# Patient Record
Sex: Male | Born: 1935 | Race: White | Hispanic: No | Marital: Married | Smoking: Former smoker
Health system: Southern US, Community
[De-identification: ages and names within clinical notes are randomized; demographics above are authoritative.]

## PROBLEM LIST (undated history)

## (undated) DIAGNOSIS — K219 Gastro-esophageal reflux disease without esophagitis: Secondary | ICD-10-CM

## (undated) DIAGNOSIS — F419 Anxiety disorder, unspecified: Secondary | ICD-10-CM

## (undated) DIAGNOSIS — M545 Low back pain, unspecified: Secondary | ICD-10-CM

## (undated) DIAGNOSIS — H409 Unspecified glaucoma: Secondary | ICD-10-CM

## (undated) DIAGNOSIS — Z974 Presence of external hearing-aid: Secondary | ICD-10-CM

## (undated) DIAGNOSIS — E785 Hyperlipidemia, unspecified: Secondary | ICD-10-CM

## (undated) DIAGNOSIS — G8929 Other chronic pain: Secondary | ICD-10-CM

## (undated) DIAGNOSIS — G250 Essential tremor: Secondary | ICD-10-CM

## (undated) DIAGNOSIS — T7840XA Allergy, unspecified, initial encounter: Secondary | ICD-10-CM

## (undated) DIAGNOSIS — G459 Transient cerebral ischemic attack, unspecified: Secondary | ICD-10-CM

## (undated) DIAGNOSIS — M1990 Unspecified osteoarthritis, unspecified site: Secondary | ICD-10-CM

## (undated) DIAGNOSIS — Z973 Presence of spectacles and contact lenses: Secondary | ICD-10-CM

## (undated) DIAGNOSIS — K759 Inflammatory liver disease, unspecified: Secondary | ICD-10-CM

## (undated) DIAGNOSIS — H9190 Unspecified hearing loss, unspecified ear: Secondary | ICD-10-CM

## (undated) DIAGNOSIS — G43909 Migraine, unspecified, not intractable, without status migrainosus: Secondary | ICD-10-CM

## (undated) DIAGNOSIS — R011 Cardiac murmur, unspecified: Secondary | ICD-10-CM

## (undated) DIAGNOSIS — J302 Other seasonal allergic rhinitis: Secondary | ICD-10-CM

## (undated) HISTORY — PX: BACK SURGERY: SHX140

## (undated) HISTORY — PX: COLONOSCOPY: SHX174

## (undated) HISTORY — PX: HEMORRHOID SURGERY: SHX153

## (undated) HISTORY — PX: UPPER GASTROINTESTINAL ENDOSCOPY: SHX188

## (undated) HISTORY — DX: Essential tremor: G25.0

## (undated) HISTORY — DX: Unspecified glaucoma: H40.9

## (undated) HISTORY — PX: KNEE ARTHROSCOPY: SUR90

## (undated) HISTORY — DX: Allergy, unspecified, initial encounter: T78.40XA

## (undated) HISTORY — PX: APPENDECTOMY: SHX54

## (undated) HISTORY — PX: TONSILLECTOMY: SUR1361

## (undated) HISTORY — DX: Transient cerebral ischemic attack, unspecified: G45.9

## (undated) HISTORY — PX: CATARACT EXTRACTION W/ INTRAOCULAR LENS  IMPLANT, BILATERAL: SHX1307

---

## 2000-06-02 ENCOUNTER — Ambulatory Visit (HOSPITAL_COMMUNITY): Admission: RE | Admit: 2000-06-02 | Discharge: 2000-06-02 | Payer: Self-pay | Admitting: Family Medicine

## 2000-12-08 ENCOUNTER — Ambulatory Visit (HOSPITAL_COMMUNITY): Admission: RE | Admit: 2000-12-08 | Discharge: 2000-12-08 | Payer: Self-pay | Admitting: Gastroenterology

## 2000-12-08 ENCOUNTER — Encounter (INDEPENDENT_AMBULATORY_CARE_PROVIDER_SITE_OTHER): Payer: Self-pay | Admitting: *Deleted

## 2000-12-08 ENCOUNTER — Encounter (INDEPENDENT_AMBULATORY_CARE_PROVIDER_SITE_OTHER): Payer: Self-pay

## 2005-08-25 HISTORY — PX: ANTERIOR CERVICAL DECOMP/DISCECTOMY FUSION: SHX1161

## 2005-09-15 ENCOUNTER — Encounter: Admission: RE | Admit: 2005-09-15 | Discharge: 2005-09-15 | Payer: Self-pay | Admitting: Neurosurgery

## 2005-11-25 ENCOUNTER — Encounter: Payer: Self-pay | Admitting: Gastroenterology

## 2005-12-12 ENCOUNTER — Inpatient Hospital Stay (HOSPITAL_COMMUNITY): Admission: RE | Admit: 2005-12-12 | Discharge: 2005-12-13 | Payer: Self-pay | Admitting: Neurosurgery

## 2009-08-23 ENCOUNTER — Ambulatory Visit: Payer: Self-pay | Admitting: Family Medicine

## 2009-08-23 DIAGNOSIS — N529 Male erectile dysfunction, unspecified: Secondary | ICD-10-CM | POA: Insufficient documentation

## 2009-08-23 DIAGNOSIS — G252 Other specified forms of tremor: Secondary | ICD-10-CM

## 2009-08-23 DIAGNOSIS — F411 Generalized anxiety disorder: Secondary | ICD-10-CM | POA: Insufficient documentation

## 2009-08-23 DIAGNOSIS — E785 Hyperlipidemia, unspecified: Secondary | ICD-10-CM | POA: Insufficient documentation

## 2009-08-23 DIAGNOSIS — G250 Essential tremor: Secondary | ICD-10-CM | POA: Insufficient documentation

## 2009-10-04 ENCOUNTER — Ambulatory Visit: Payer: Self-pay | Admitting: Family Medicine

## 2009-10-04 DIAGNOSIS — G2581 Restless legs syndrome: Secondary | ICD-10-CM | POA: Insufficient documentation

## 2009-11-05 ENCOUNTER — Ambulatory Visit: Payer: Self-pay | Admitting: Family Medicine

## 2009-11-05 DIAGNOSIS — K219 Gastro-esophageal reflux disease without esophagitis: Secondary | ICD-10-CM | POA: Insufficient documentation

## 2009-11-20 ENCOUNTER — Telehealth: Payer: Self-pay | Admitting: Family Medicine

## 2010-06-12 ENCOUNTER — Telehealth: Payer: Self-pay | Admitting: Family Medicine

## 2010-06-14 ENCOUNTER — Telehealth: Payer: Self-pay | Admitting: Family Medicine

## 2010-08-01 ENCOUNTER — Ambulatory Visit: Payer: Self-pay | Admitting: Family Medicine

## 2010-08-01 ENCOUNTER — Encounter: Payer: Self-pay | Admitting: Family Medicine

## 2010-08-01 ENCOUNTER — Encounter: Payer: Self-pay | Admitting: Gastroenterology

## 2010-08-01 LAB — CONVERTED CEMR LAB
ALT: 23 units/L (ref 0–53)
AST: 23 units/L (ref 0–37)
Albumin: 4 g/dL (ref 3.5–5.2)
Alkaline Phosphatase: 52 units/L (ref 39–117)
BUN: 15 mg/dL (ref 6–23)
Basophils Absolute: 0 10*3/uL (ref 0.0–0.1)
Basophils Relative: 0.4 % (ref 0.0–3.0)
Bilirubin, Direct: 0.1 mg/dL (ref 0.0–0.3)
CO2: 28 meq/L (ref 19–32)
Calcium: 9.5 mg/dL (ref 8.4–10.5)
Chloride: 103 meq/L (ref 96–112)
Cholesterol: 180 mg/dL (ref 0–200)
Creatinine, Ser: 1.2 mg/dL (ref 0.4–1.5)
Eosinophils Absolute: 0 10*3/uL (ref 0.0–0.7)
Eosinophils Relative: 0.5 % (ref 0.0–5.0)
GFR calc non Af Amer: 66.2 mL/min (ref >60.00)
Glucose, Bld: 103 mg/dL — ABNORMAL HIGH (ref 70–99)
HCT: 45.2 % (ref 39.0–52.0)
HDL: 46.5 mg/dL (ref >39.00)
Hemoglobin: 15.3 g/dL (ref 13.0–17.0)
LDL Cholesterol: 114 mg/dL — ABNORMAL HIGH (ref 0–99)
Lymphocytes Relative: 23 % (ref 12.0–46.0)
Lymphs Abs: 1.6 10*3/uL (ref 0.7–4.0)
MCHC: 33.9 g/dL (ref 30.0–36.0)
MCV: 92.3 fL (ref 78.0–100.0)
Monocytes Absolute: 0.5 10*3/uL (ref 0.1–1.0)
Monocytes Relative: 6.7 % (ref 3.0–12.0)
Neutro Abs: 4.9 10*3/uL (ref 1.4–7.7)
Neutrophils Relative %: 69.4 % (ref 43.0–77.0)
PSA: 2.32 ng/mL (ref 0.10–4.00)
Platelets: 260 10*3/uL (ref 150.0–400.0)
Potassium: 5.1 meq/L (ref 3.5–5.1)
RBC: 4.9 M/uL (ref 4.22–5.81)
RDW: 13.2 % (ref 11.5–14.6)
Sodium: 138 meq/L (ref 135–145)
TSH: 1.21 microintl units/mL (ref 0.35–5.50)
Total Bilirubin: 0.6 mg/dL (ref 0.3–1.2)
Total CHOL/HDL Ratio: 4
Total Protein: 6.5 g/dL (ref 6.0–8.3)
Triglycerides: 97 mg/dL (ref 0.0–149.0)
VLDL: 19.4 mg/dL (ref 0.0–40.0)
WBC: 7.1 10*3/uL (ref 4.5–10.5)

## 2010-08-02 ENCOUNTER — Telehealth: Payer: Self-pay | Admitting: Family Medicine

## 2010-08-06 ENCOUNTER — Ambulatory Visit: Payer: Self-pay | Admitting: Gastroenterology

## 2010-08-06 DIAGNOSIS — Z860100 Personal history of colon polyps, unspecified: Secondary | ICD-10-CM | POA: Insufficient documentation

## 2010-08-06 DIAGNOSIS — R1319 Other dysphagia: Secondary | ICD-10-CM | POA: Insufficient documentation

## 2010-08-06 DIAGNOSIS — R1310 Dysphagia, unspecified: Secondary | ICD-10-CM | POA: Insufficient documentation

## 2010-08-14 ENCOUNTER — Ambulatory Visit: Payer: Self-pay

## 2010-08-29 ENCOUNTER — Ambulatory Visit: Admission: RE | Admit: 2010-08-29 | Discharge: 2010-08-29 | Payer: Self-pay | Source: Home / Self Care

## 2010-08-29 ENCOUNTER — Other Ambulatory Visit: Payer: Self-pay | Admitting: Cardiovascular Disease

## 2010-08-29 ENCOUNTER — Ambulatory Visit (HOSPITAL_COMMUNITY)
Admission: RE | Admit: 2010-08-29 | Discharge: 2010-08-29 | Payer: Self-pay | Source: Home / Self Care | Attending: Cardiovascular Disease | Admitting: Cardiovascular Disease

## 2010-09-11 ENCOUNTER — Other Ambulatory Visit: Payer: Self-pay | Admitting: Gastroenterology

## 2010-09-11 ENCOUNTER — Ambulatory Visit
Admission: RE | Admit: 2010-09-11 | Discharge: 2010-09-11 | Payer: Self-pay | Source: Home / Self Care | Attending: Gastroenterology | Admitting: Gastroenterology

## 2010-09-17 ENCOUNTER — Encounter: Payer: Self-pay | Admitting: Gastroenterology

## 2010-09-24 NOTE — Assessment & Plan Note (Signed)
Summary: fu per pt/njr---PT [facility] // RS   Vital Signs:  Patient profile:   76 year old male Weight:      164 pounds Temp:     98.5 degrees F oral BP sitting:   110 / 68  (left arm) Cuff size:   regular  Vitals Entered By: Kern Reap CMA Duncan Dull) (November 05, 2009 2:51 PM)  Reason for Visit follow up with concerns  History of Present Illness: Jason Farmer is a 76 year old male, who comes in today for evaluation of two problems actually 3.  We started him on Mirapex .5 nightly in February for restless leg syndrome.  He comes back today saying is 50% improved.  No side effects from the medication.  If any trouble with pain tingling and pins and needle sensation in his right hand.  He is a curse at night after he goes to sleep.  No history of trauma.  He also took 14 days and OTC Prilosec and lot of his reflux symptoms went away.   He can take it on a long-term basis ????????  Allergies: No Known Drug Allergies  Past History:  Past medical, surgical, family and social histories (including risk factors) reviewed for relevance to current acute and chronic problems.  Past Medical History: Reviewed history from 08/23/2009 and no changes required. hx jaundice as a child leg cramping seasonal allergies  Past Surgical History: Reviewed history from 08/23/2009 and no changes required. left knee Appendectomy Tonsillectomy hemrroids neck  Family History: Reviewed history from 08/23/2009 and no changes required. Father: deceased - demenia Mother: deceased  Siblings: brother - healthy Chidren: 2 boys, 2 girls - healthy  Social History: Reviewed history from 08/23/2009 and no changes required. Retired Married Alcohol use-no Drug use-no  Review of Systems      See HPI  Physical Exam  General:  Well-developed,well-nourished,in no acute distress; alert,appropriate and cooperative throughout examination Msk:  No deformity or scoliosis noted of thoracic or lumbar spine.   Pulses:  R  and L carotid,radial,femoral,dorsalis pedis and posterior tibial pulses are full and equal bilaterally Extremities:  No clubbing, cyanosis, edema, or deformity noted with normal full range of motion of all joints.   Neurologic:  No cranial nerve deficits noted. Station and gait are normal. Plantar reflexes are down-going bilaterally. DTRs are symmetrical throughout. Sensory, motor and coordinative functions appear intact.   Problems:  Medical Problems Added: 1)  Dx of Carpal Tunnel Syndrome  (ICD-354.0) 2)  Dx of Genella Rife  (ICD-530.81)  Impression & Recommendations:  Problem # 1:  RESTLESS LEG SYNDROME (ICD-333.94) Assessment Improved  Orders: Prescription Created Electronically [phone])  Problem # 2:  CARPAL TUNNEL SYNDROME (ICD-354.0) Assessment: New  Orders: Prescription Created Electronically [id number])  Problem # 3:  GERD (ICD-530.81) Assessment: New  His updated medication list for this problem includes:    Prilosec Otc 20 Mg Tbec (Omeprazole magnesium) ..... Once daily  Orders: Prescription Created Electronically [phone])  Complete Medication List: 1)  Atenolol 100 Mg Tabs (Atenolol) .... Take one tab once daily 2)  Crestor 10 Mg Tabs (Rosuvastatin calcium) .... Take one tab once daily 3)  Alprazolam 0.5 Mg Tabs (Alprazolam) .... Take one tab once daily as needed 4)  Viagra 50 Mg Tabs (Sildenafil citrate) .... Uad 5)  Imiquimod 5 % Crea (Imiquimod) .... Use as directed 6)  Ureacin-20 20 % Crea (Urea) .... Use as directed 7)  Prilosec Otc 20 Mg Tbec (Omeprazole magnesium) .... Once daily 8)  Mirapex 1 Mg Tabs (Pramipexole dihydrochloride) .Marland KitchenMarland KitchenMarland Kitchen  1 tab @ bedtime  Patient Instructions: 1)  you can take OTC Prilosec daily. 2)  Used the short arm splint nightly for two to 3 weeks.  If after that time, you don't see any improvement.  Call the hand surgeon, Dr.Sypher at the hand Center. 3)  Increased the MiraLax to a full milligram tablet at bedtime Prescriptions: MIRAPEX 1 MG  TABS (PRAMIPEXOLE DIHYDROCHLORIDE) 1 tab @ bedtime  #100 x 3   Entered and Authorized by:   Roderick Pee MD   Signed by:   Roderick Pee MD on 11/05/2009   Method used:   Print then Give to Patient   RxID:   [id number]

## 2010-09-24 NOTE — Letter (Signed)
Summary: New Patient letter  [facility]  [address] [place], [place] [address]   Phone: [phone]  Fax: [phone]       08/01/2010 MRN: [id number]  [facility] [address] [place], [place]  [address]  Dear Jason Farmer,  Welcome to the Gastroenterology Division at [facility].    You are scheduled to see Dr.  Russella Dar on 08-06-10 at 9:30am on the 3rd floor at [facility], [address]  We ask that you try to arrive at our office 15 minutes prior to your appointment time to allow for check-in.  We would like you to complete the enclosed self-administered evaluation form prior to your visit and bring it with you on the day of your appointment.  We will review it with you.  Also, please bring a complete list of all your medications or, if you prefer, bring the medication bottles and we will list them.  Please bring your insurance card so that we may make a copy of it.  If your insurance requires a referral to see a specialist, please bring your referral form from your primary care physician.  Co-payments are due at the time of your visit and may be paid by cash, check or credit card.     Your office visit will consist of a consult with your physician (includes a physical exam), any laboratory testing he/she may order, scheduling of any necessary diagnostic testing (e.g. x-ray, ultrasound, CT-scan), and scheduling of a procedure (e.g. Endoscopy, Colonoscopy) if required.  Please allow enough time on your schedule to allow for any/all of these possibilities.    If you cannot keep your appointment, please call [phone] to cancel or reschedule prior to your appointment date.  This allows [name] the opportunity to schedule an appointment for another patient in need of care.  If you do not cancel or reschedule by 5 p.m. the business day prior to your appointment date, you will be charged a $50.00 late cancellation/no-show fee.    Thank you for choosing  [facility] for your medical needs.  We appreciate the opportunity to care for you.  Please visit [name] at our website  to learn more about our practice.                     Sincerely,                                                             The Gastroenterology Division

## 2010-09-24 NOTE — Assessment & Plan Note (Signed)
Summary: cpx/pt will be fasting/mm   Vital Signs:  Patient profile:   76 year old male Height:      65 inches Weight:      165 pounds BMI:     27.56 O2 Sat:      97 % on Room air Temp:     97.8 degrees F oral Pulse rate:   83 / minute BP sitting:   122 / 82  (left arm) Cuff size:   regular  Vitals Entered By: Kathlene November LPN (August 01, 2010 8:21 AM)  O2 Flow:  Room air CC: CPE   CC:  CPE.  History of Present Illness: Pacer is  a 76 year old, married, nonsmoker who comes in today for Medicare wellness exam.  He has a history of underlying hyperlipidemia, for which he takes Crestor 10 mg nightly Will check lipid panel today.  Also for anxiety.  He takes alprazolam .5 mg daily p.r.n.  He is on atenolol 50 mg daily for for an essential tremor.  He continues to go to [facility] to be treated for the wart on his foot.  Other issues are descending difficulty swallowing and occasionally food gets stuck in his esophagus.  We will set him up for a GI evaluation ASAP advised to stay on a soft diet in the meantime, he continues to show with restless leg syndrome, and some numbness in his right hand.  We discussed this in May.  Medication and splinting has not helped.  Will refer to neurology.  He cannot tolerate aspirin even low-dose makes it improves.  He's also noticed some shortness of breath when he climbs stairs.  He states he can walk on level ground forever and get no shortness or breath.  However, climbing stairs.  He is in shortness of breath, sometimes with no chest pain.  Is concerned about his heart and tetanus 2005 Pneumovax 2010 seasonal flu shot 2011 Here for Medicare AWV:  1.   Risk factors based on Past M, S, F history:...see above 2.   Physical Activities: walks daily 3.   Depression/mood: good mood.  No depression 4.   Hearing: decreased, but declines evaluation 5.   ADL's: functions independently 6.   Fall Risk: reviewed not identified 7.   Home Safety: no guns in the  house 8.   Height, weight, &visual acuity:height weight, normal.  Vision normal 9.   Counseling: soft diet until we can get him a GI consult 10.   Labs ordered based on risk factors: done today 11.           Referral Coordination......GI consult,,,,and neurology consult 12.           Care Plan......Marland Kitchenreviewed all medications 13.            Cognitive Assessment .Marland Kitchen...orient x 3 financially independent  Current Medications (verified): 1)  Crestor 10 Mg Tabs (Rosuvastatin Calcium) .... Take One Tab Once Daily 2)  Alprazolam 0.5 Mg Tabs (Alprazolam) .... Take One Tab Once Daily As Needed 3)  Atenolol 50 Mg Tabs (Atenolol) .... Take One Tab By Mouth Once Daily  Allergies (verified): No Known Drug Allergies  Comments:  Nurse/Medical Assistant: The patient's medications and allergies were reviewed with the patient and were updated in the Medication and Allergy Lists. Kathlene November LPN (August 01, 2010 8:23 AM)  Past History:  Past medical, surgical, family and social histories (including risk factors) reviewed, and no changes noted (except as noted below).  Past Medical History: Reviewed history  from 08/23/2009 and no changes required. hx jaundice as a child leg cramping seasonal allergies  Past Surgical History: Reviewed history from 08/23/2009 and no changes required. left knee Appendectomy Tonsillectomy hemrroids neck  Family History: Reviewed history from 08/23/2009 and no changes required. Father: deceased - demenia Mother: deceased  Siblings: brother - healthy Chidren: 2 boys, 2 girls - healthy  Social History: Reviewed history from 08/23/2009 and no changes required. Retired Married Alcohol use-no Drug use-no  Review of Systems      See HPI  Physical Exam  General:  Well-developed,well-nourished,in no acute distress; alert,appropriate and cooperative throughout examination Head:  Normocephalic and atraumatic without obvious abnormalities. No apparent alopecia  or balding. Eyes:  No corneal or conjunctival inflammation noted. EOMI. Perrla. Funduscopic exam benign, without hemorrhages, exudates or papilledema. Vision grossly normal. Ears:  External ear exam shows no significant lesions or deformities.  Otoscopic examination reveals clear canals, tympanic membranes are intact bilaterally without bulging, retraction, inflammation or discharge. Hearing is grossly normal bilaterally. Nose:  External nasal examination shows no deformity or inflammation. Nasal mucosa are pink and moist without lesions or exudates. Mouth:  Oral mucosa and oropharynx without lesions or exudates.  Teeth in good repair. Neck:  No deformities, masses, or tenderness noted. Chest Wall:  symmetrical decrease in breath sounds, history of smoking, quit 2010 Breasts:  No masses or gynecomastia noted Lungs:  Normal respiratory effort, chest expands symmetrically. Lungs are clear to auscultation, no crackles or wheezes. Heart:  Normal rate and regular rhythm. S1 and S2 normal without gallop, murmur, click, rub or other extra sounds. Abdomen:  Bowel sounds positive,abdomen soft and non-tender without masses, organomegaly or hernias noted. Rectal:  No external abnormalities noted. Normal sphincter tone. No rectal masses or tenderness. Genitalia:  Testes bilaterally descended without nodularity, tenderness or masses. No scrotal masses or lesions. No penis lesions or urethral discharge. Prostate:  no nodules, no asymmetry, and 1+ enlarged.   Msk:  No deformity or scoliosis noted of thoracic or lumbar spine.   Pulses:  R and L carotid,radial,femoral,dorsalis pedis and posterior tibial pulses are full and equal bilaterally Extremities:  No clubbing, cyanosis, edema, or deformity noted with normal full range of motion of all joints.   Neurologic:  No cranial nerve deficits noted. Station and gait are normal. Plantar reflexes are down-going bilaterally. DTRs are symmetrical throughout. Sensory, motor  and coordinative functions appear intact. Skin:  total body skin exam normal.  He has a garden variety of freckles moles and seborrheic keratoses.  A wart on his left foot.  This rather large Cervical Nodes:  No lymphadenopathy noted Axillary Nodes:  No palpable lymphadenopathy Inguinal Nodes:  No significant adenopathy Psych:  Cognition and judgment appear intact. Alert and cooperative with normal attention span and concentration. No apparent delusions, illusions, hallucinations   Problems:  Medical Problems Added: 1)  Dx of Routine General Medical Exam@health  Care Facl  (ICD-V70.0) 2)  Dx of Shortness of Breath  (ICD-786.05)  Impression & Recommendations:  Problem # 1:  SHORTNESS OF BREATH (ICD-786.05) Assessment New  Orders: Venipuncture ([address]) TLB-Lipid Panel (80061-LIPID) TLB-BMP (Basic Metabolic Panel-BMET) (80048-METABOL) TLB-CBC Platelet - w/Differential (85025-CBCD) TLB-Hepatic/Liver Function Pnl (80076-HEPATIC) TLB-TSH (Thyroid Stimulating Hormone) (84443-TSH) TLB-PSA (Prostate Specific Antigen) (60454-UJW) Cardiology Referral (Cardiology) Prescription Created Electronically [id number]) Medicare -1st Annual Wellness Visit [phone]) Urinalysis-dipstick only (Medicare patient) (62130QM) T-2 View CXR (71020TC)  Problem # 2:  CARPAL TUNNEL SYNDROME (ICD-354.0) Assessment: Deteriorated  Orders: Venipuncture ([address]) TLB-Lipid Panel (80061-LIPID) TLB-BMP (Basic Metabolic Panel-BMET) (80048-METABOL)  TLB-CBC Platelet - w/Differential (85025-CBCD) TLB-Hepatic/Liver Function Pnl (80076-HEPATIC) TLB-TSH (Thyroid Stimulating Hormone) (84443-TSH) TLB-PSA (Prostate Specific Antigen) (84153-PSA) Prescription Created Electronically [phone]) Medicare -1st Annual Wellness Visit [phone]) Urinalysis-dipstick only (Medicare patient) (32440NU)  Problem # 3:  GERD (ICD-530.81) Assessment: Deteriorated  The following medications were removed from the medication list:    Prilosec Otc 20  Mg Tbec (Omeprazole magnesium) ..... Once daily  Orders: Venipuncture ([address]) TLB-Lipid Panel (80061-LIPID) TLB-BMP (Basic Metabolic Panel-BMET) (80048-METABOL) TLB-CBC Platelet - w/Differential (85025-CBCD) TLB-Hepatic/Liver Function Pnl (80076-HEPATIC) TLB-TSH (Thyroid Stimulating Hormone) (84443-TSH) TLB-PSA (Prostate Specific Antigen) (84153-PSA) Prescription Created Electronically [phone]) Medicare -1st Annual Wellness Visit [phone]) Urinalysis-dipstick only (Medicare patient) (59563OV) Gastroenterology Referral (GI)  Problem # 4:  ANXIETY DISORDER, GENERALIZED (ICD-300.02) Assessment: Improved  His updated medication list for this problem includes:    Alprazolam 0.5 Mg Tabs (Alprazolam) .Marland Kitchen... Take one tab once daily as needed  Orders: Venipuncture (56433) TLB-Lipid Panel (80061-LIPID) TLB-BMP (Basic Metabolic Panel-BMET) (80048-METABOL) TLB-CBC Platelet - w/Differential (85025-CBCD) TLB-Hepatic/Liver Function Pnl (80076-HEPATIC) TLB-TSH (Thyroid Stimulating Hormone) (84443-TSH) TLB-PSA (Prostate Specific Antigen) (84153-PSA) Prescription Created Electronically [phone]) Medicare -1st Annual Wellness Visit [phone]) Urinalysis-dipstick only (Medicare patient) (06301SW)  Problem # 5:  TREMOR, ESSENTIAL (ICD-333.1) Assessment: Improved  Orders: Venipuncture (10932) TLB-Lipid Panel (80061-LIPID) TLB-BMP (Basic Metabolic Panel-BMET) (80048-METABOL) TLB-CBC Platelet - w/Differential (85025-CBCD) TLB-Hepatic/Liver Function Pnl (80076-HEPATIC) TLB-TSH (Thyroid Stimulating Hormone) (84443-TSH) TLB-PSA (Prostate Specific Antigen) (84153-PSA) Prescription Created Electronically [id number]) Medicare -1st Annual Wellness Visit [phone]) Urinalysis-dipstick only (Medicare patient) (42706CB)  Problem # 6:  HYPERLIPIDEMIA (ICD-272.4) Assessment: Improved  His updated medication list for this problem includes:    Crestor 10 Mg Tabs (Rosuvastatin calcium) .Marland Kitchen... Take one tab once  daily  Orders: Venipuncture (76283) TLB-Lipid Panel (80061-LIPID) TLB-BMP (Basic Metabolic Panel-BMET) (80048-METABOL) TLB-CBC Platelet - w/Differential (85025-CBCD) TLB-Hepatic/Liver Function Pnl (80076-HEPATIC) TLB-TSH (Thyroid Stimulating Hormone) (84443-TSH) TLB-PSA (Prostate Specific Antigen) (84153-PSA) Prescription Created Electronically [phone]) Medicare -1st Annual Wellness Visit [phone]) Urinalysis-dipstick only (Medicare patient) (71062IR)  Problem # 7:  Preventive Health Care (ICD-V70.0) Assessment: Unchanged  Complete Medication List: 1)  Crestor 10 Mg Tabs (Rosuvastatin calcium) .... Take one tab once daily 2)  Alprazolam 0.5 Mg Tabs (Alprazolam) .... Take one tab once daily as needed 3)  Atenolol 50 Mg Tabs (Atenolol) .... Take one tab by mouth once daily  Other Orders: Flu Vaccine 38yrs + (48546) Admin 1st Vaccine (27035) Specimen Handling (00938)  Patient Instructions: 1)  we will do the pulmonary function studies today, and go get a chest x-ray.........Marland Kitchen we will then be to set up to go to cardiology to get a cardiac stress test.  Once we finished the evaluation will sit down and discuss all the findings together. 2)  I will also call to get her set up for GI consult ASAP for evaluation of the difficulty swallowing and food getting stuck in her esophagus.  In the meantime stay on a soft diet and take the Prilosec one twice daily 3)  We will also get to set up for a neurologic consult for evaluation of the wrestles like syndrome and the carpal tunnel syndrome Prescriptions: ALPRAZOLAM 0.5 MG TABS (ALPRAZOLAM) take one tab once daily as needed  #60 x 1   Entered and Authorized by:   Roderick Pee MD   Signed by:   Roderick Pee MD on 08/01/2010   Method used:   Print then Give to Patient   RxID:   [id number] ATENOLOL 50 MG TABS (ATENOLOL) take one tab by mouth once daily  #100  x 3   Entered and Authorized by:   Roderick Pee MD   Signed by:   Roderick Pee  MD on 08/01/2010   Method used:   Print then Give to Patient   RxID:   [id number] CRESTOR 10 MG TABS (ROSUVASTATIN CALCIUM) take one tab once daily  #100 x 3   Entered and Authorized by:   Roderick Pee MD   Signed by:   Roderick Pee MD on 08/01/2010   Method used:   Print then Give to Patient   RxID:   [id number]    Orders Added: 1)  Flu Vaccine 39yrs + [01027] 2)  Admin 1st Vaccine [90471] 3)  Venipuncture [25366] 4)  TLB-Lipid Panel [80061-LIPID] 5)  TLB-BMP (Basic Metabolic Panel-BMET) [80048-METABOL] 6)  TLB-CBC Platelet - w/Differential [85025-CBCD] 7)  TLB-Hepatic/Liver Function Pnl [80076-HEPATIC] 8)  TLB-TSH (Thyroid Stimulating Hormone) [84443-TSH] 9)  TLB-PSA (Prostate Specific Antigen) [44034-VQQ] 10)  Cardiology Referral [Cardiology] 11)  Prescription Created Electronically [G8553] 12)  Medicare -1st Annual Wellness Visit [G0438] 13)  Urinalysis-dipstick only (Medicare patient) [81003QW] 14)  T-2 View CXR [71020TC] 15)  Specimen Handling [99000] 16)  Gastroenterology Referral [GI]   Immunization History:  Pneumovax Immunization History:    Pneumovax:  historical (07/25/2009)  Immunizations Administered:  Influenza Vaccine # 1:    Vaccine Type: Fluvax 3+    Site: right deltoid    Mfr: GlaxoSmithKline    Dose: 0.5 ml    Route: IM    Given by: Kathlene November LPN    Exp. Date: 02/22/2011    Lot #: [id number]    VIS given: 03/19/10 version given August 01, 2010.   Immunization History:  Pneumovax Immunization History:    Pneumovax:  Historical (07/25/2009)  Immunizations Administered:  Influenza Vaccine # 1:    Vaccine Type: Fluvax 3+    Site: right deltoid    Mfr: GlaxoSmithKline    Dose: 0.5 ml    Route: IM    Given by: Kathlene November LPN    Exp. Date: 02/22/2011    Lot #: [id number]    VIS given: 03/19/10 version given August 01, 2010.    Appended Document: cpx/pt will be fasting/mm  Laboratory Results   Urine  Tests    Routine Urinalysis   Color: yellow Appearance: Clear Glucose: negative   (Normal Range: Negative) Bilirubin: negative   (Normal Range: Negative) Ketone: negative   (Normal Range: Negative) Spec. Gravity: 1.020   (Normal Range: 1.003-1.035) Blood: 3+   (Normal Range: Negative) pH: 5.0   (Normal Range: 5.0-8.0) Protein: 1+   (Normal Range: Negative) Urobilinogen: 0.2   (Normal Range: 0-1) Nitrite: negative   (Normal Range: Negative) Leukocyte Esterace: negative   (Normal Range: Negative)    Comments: Rita Ohara  August 01, 2010 3:26 PM

## 2010-09-24 NOTE — Progress Notes (Signed)
Summary: rx clarification  Phone Note From Pharmacy   Summary of Call: rx clairification Initial call taken by: Kern Reap CMA Duncan Dull),  June 14, 2010 2:04 PM    New/Updated Medications: ATENOLOL 50 MG TABS (ATENOLOL) take one tab by mouth once daily

## 2010-09-24 NOTE — Assessment & Plan Note (Signed)
Summary: BRAND NEW PT/TO ESTABLISH/PER DR Jason Farmer/CJR   Vital Signs:  Patient profile:   76 year old male Height:      65 inches Weight:      167 pounds BMI:     27.89 Temp:     98.5 degrees F oral BP sitting:   124 / 84  (right arm) Cuff size:   regular  Vitals Entered By: Kern Reap CMA Duncan Dull) (August 23, 2009 10:23 AM)  Reason for Visit new to establish   History of Present Illness: Jason Farmer is a 76 year old, married male, nonsmoker, who comes in today to be established as a new patient.  He is a long-term resident of [place].  Retired Print production planner.  Transferring from Dr. Georgina Pillion at [facility].  Dr. Georgina Pillion did a physical examination and lab work on and two weeks ago.  All of which was normal.  He goes to the [place] in January of every year and likes to have a physical in November.  He takes atenolol 50 mg daily for a benign tremor.  He takes Crestor 10 mg nightly for hyperlipidemia.  He also takes alprazolam .25 p.r.n. for anxiety.  Review of systems shows he gets routine eye care.  Dental care.  Colonoscopy done, and GI normal except for a polyp x 1.  Tetanus booster 2005, up-to-date on all his other vaccinations.  his wife also wants to transfer however, she is due to be admitted to the hospital and have a surgical procedure by Dr. Cyndra Numbers.  He would also like Viagra.    Preventive Screening-Counseling & Management  Alcohol-Tobacco     Smoking Status: quit > 6 months     Year Quit: x 24 years      Drug Use:  no.    Allergies (verified): No Known Drug Allergies  Past History:  Past medical, surgical, family and social histories (including risk factors) reviewed, and no changes noted (except as noted below).  Past Medical History: hx jaundice as a child leg cramping seasonal allergies  Past Surgical History: left knee Appendectomy Tonsillectomy hemrroids neck  Family History: Reviewed history and no changes required. Father: deceased -  demenia Mother: deceased  Siblings: brother - healthy Chidren: 2 boys, 2 girls - healthy  Social History: Reviewed history and no changes required. Retired Married Alcohol use-no Drug use-no Drug Use:  no Smoking Status:  quit > 6 months  Review of Systems      See HPI  Physical Exam  General:  Well-developed,well-nourished,in no acute distress; alert,appropriate and cooperative throughout examination   Problems:  Medical Problems Added: 1)  Dx of Erectile Dysfunction, Organic  (ICD-607.84) 2)  Dx of Anxiety Disorder, Generalized  (ICD-300.02) 3)  Dx of Tremor, Essential  (ICD-333.1) 4)  Dx of Hyperlipidemia  (ICD-272.4)  Impression & Recommendations:  Problem # 1:  ANXIETY DISORDER, GENERALIZED (ICD-300.02) Assessment New  His updated medication list for this problem includes:    Alprazolam 0.5 Mg Tabs (Alprazolam) .Marland Kitchen... Take one tab once daily as needed  Problem # 2:  TREMOR, ESSENTIAL (ICD-333.1) Assessment: New  Problem # 3:  HYPERLIPIDEMIA (ICD-272.4) Assessment: New  His updated medication list for this problem includes:    Crestor 10 Mg Tabs (Rosuvastatin calcium) .Marland Kitchen... Take one tab once daily  Problem # 4:  ERECTILE DYSFUNCTION, ORGANIC (ICD-607.84) Assessment: New  His updated medication list for this problem includes:    Viagra 50 Mg Tabs (Sildenafil citrate) ..... Uad  Complete Medication List: 1)  Atenolol  100 Mg Tabs (Atenolol) .... Take one tab once daily 2)  Crestor 10 Mg Tabs (Rosuvastatin calcium) .... Take one tab once daily 3)  Alprazolam 0.5 Mg Tabs (Alprazolam) .... Take one tab once daily as needed 4)  Viagra 50 Mg Tabs (Sildenafil citrate) .... Uad  Patient Instructions: 1)  call in August  to get set up for your annual exam in November of 2011 2)  Take an Aspirin every day. 3)  Viagra  is 50 mg one half tabletOne hour prior to sex with water Prescriptions: ALPRAZOLAM 0.5 MG TABS (ALPRAZOLAM) take one tab once daily as needed  #60  x 1   Entered and Authorized by:   Roderick Pee MD   Signed by:   Roderick Pee MD on 08/23/2009   Method used:   Print then Give to Patient   RxID:   [id number] ALPRAZOLAM 0.5 MG TABS (ALPRAZOLAM) take one tab once daily as needed  #30 x 2   Entered and Authorized by:   Roderick Pee MD   Signed by:   Roderick Pee MD on 08/23/2009   Method used:   Print then Give to Patient   RxID:   [id number] CRESTOR 10 MG TABS (ROSUVASTATIN CALCIUM) take one tab once daily  #100 x 3   Entered and Authorized by:   Roderick Pee MD   Signed by:   Roderick Pee MD on 08/23/2009   Method used:   Print then Give to Patient   RxID:   [id number] ATENOLOL 100 MG TABS (ATENOLOL) take one tab once daily  #100 x 3   Entered and Authorized by:   Roderick Pee MD   Signed by:   Roderick Pee MD on 08/23/2009   Method used:   Print then Give to Patient   RxID:   [id number] VIAGRA 50 MG TABS (SILDENAFIL CITRATE) UAD  #6 x 11   Entered and Authorized by:   Roderick Pee MD   Signed by:   Roderick Pee MD on 08/23/2009   Method used:   Print then Give to Patient   RxID:   [id number]

## 2010-09-24 NOTE — Progress Notes (Signed)
  Phone Note Outgoing Call   Summary of Call: I called Jason Farmer about his lab work.  Chemistries are normal.  He has blood in his urine.  This is been a persistent problem.  He said urologic evaluations all of which were normal Initial call taken by: Roderick Pee MD,  August 02, 2010 8:56 AM

## 2010-09-24 NOTE — Assessment & Plan Note (Signed)
Summary: BILATERAL LEG PAIN / CRAMPS // RS   Vital Signs:  Patient profile:   76 year old male Weight:      165 pounds Temp:     98.0 degrees F oral BP sitting:   120 / 80  (left arm) Cuff size:   regular  Vitals Entered By: Kern Reap CMA Duncan Dull) (October 04, 2009 8:57 AM)  Reason for Visit leg cramping and heartburn  History of Present Illness: Kori is a 76 year old male, who comes in today for evaluation of 15 years history of leg cramps.  He sent a history of restless leg syndrome for 15 years.  He was initially treated with quinine.  He then has been taking the over-the-counter product however, doesn't seem to be working.  He has no discomfort in his legs when he walks.  No history of peripheral vascular disease.  Review of systems negative except he is now having issues with heartburn and other issues.  He would like to discuss advised to set up a follow-up appointment  Allergies: No Known Drug Allergies  Past History:  Past medical, surgical, family and social histories (including risk factors) reviewed for relevance to current acute and chronic problems.  Past Medical History: Reviewed history from 08/23/2009 and no changes required. hx jaundice as a child leg cramping seasonal allergies  Past Surgical History: Reviewed history from 08/23/2009 and no changes required. left knee Appendectomy Tonsillectomy hemrroids neck  Family History: Reviewed history from 08/23/2009 and no changes required. Father: deceased - demenia Mother: deceased  Siblings: brother - healthy Chidren: 2 boys, 2 girls - healthy  Social History: Reviewed history from 08/23/2009 and no changes required. Retired Married Alcohol use-no Drug use-no  Review of Systems      See HPI  Physical Exam  General:  Well-developed,well-nourished,in no acute distress; alert,appropriate and cooperative throughout examination Msk:  No deformity or scoliosis noted of thoracic or lumbar  spine.   Pulses:  R and L carotid,radial,femoral,dorsalis pedis and posterior tibial pulses are full and equal bilaterally Extremities:  No clubbing, cyanosis, edema, or deformity noted with normal full range of motion of all joints.   Neurologic:  No cranial nerve deficits noted. Station and gait are normal. Plantar reflexes are down-going bilaterally. DTRs are symmetrical throughout. Sensory, motor and coordinative functions appear intact.   Impression & Recommendations:  Problem # 1:  RESTLESS LEG SYNDROME (ICD-333.94) Assessment Deteriorated  Orders: Prescription Created Electronically [id number])  Complete Medication List: 1)  Atenolol 100 Mg Tabs (Atenolol) .... Take one tab once daily 2)  Crestor 10 Mg Tabs (Rosuvastatin calcium) .... Take one tab once daily 3)  Alprazolam 0.5 Mg Tabs (Alprazolam) .... Take one tab once daily as needed 4)  Viagra 50 Mg Tabs (Sildenafil citrate) .... Uad 5)  Imiquimod 5 % Crea (Imiquimod) .... Use as directed 6)  Mirapex 0.5 Mg Tabs (Pramipexole dihydrochloride) .Marland Kitchen.. 1 tab @ bedtime  Patient Instructions: 1)  begin Mirapex .5 nightly........Marland Kitchen max dose is 2 mg nightly......... if in 4 weeks.  u  don't see any improvement on the 0.5-mg tablet, then increase it to a full milligram.  Nightly. 2)  Since you have other concerns.  Set up an appointment next week to evaluate those Prescriptions: MIRAPEX 0.5 MG TABS (PRAMIPEXOLE DIHYDROCHLORIDE) 1 tab @ bedtime  #100 x 3   Entered and Authorized by:   Roderick Pee MD   Signed by:   Roderick Pee MD on 10/04/2009   Method used:  Print then Give to Patient   RxID:   [id number] MIRAPEX 0.5 MG TABS (PRAMIPEXOLE DIHYDROCHLORIDE) 1 tab @ bedtime  #100 x 3   Entered and Authorized by:   Roderick Pee MD   Signed by:   Roderick Pee MD on 10/04/2009   Method used:   Electronically to        [facility]* (retail)       [address]       [place], [place]  [id number]       Ph:  [phone]       Fax: [phone]   RxID:   [id number]

## 2010-09-24 NOTE — Miscellaneous (Signed)
Summary: Appointment Canceled  Appointment status changed to canceled by [employer] on 08/01/2010 2:01 PM.  Cancellation Comments --------------------- CRS/sob/wt:165/insur:mdeicare/MD:DR.Todd-mb  Appointment Information ----------------------- Appt Type:  CARDIOLOGY NUCLEAR TESTING      Date:  Wednesday, August 14, 2010      Time:  12:00 PM for 15 min   Urgency:  Routine   Made By:  Pearson Grippe  To Visit:  [id number]    Reason:  CRS/sob/wt:165/insur:mdeicare/MD:DR.Todd-mb  Appt Comments ------------- -- 08/01/10 14:01: (CEMR) CANCELED -- CRS/sob/wt:165/insur:mdeicare/MD:DR.Todd-mb -- 08/01/10 13:53: (CEMR) BOOKED -- Routine CARDIOLOGY NUCLEAR TESTING at 08/14/2010 12:00 PM for 15 min CRS/sob/wt:165/insur:mdeicare/MD:DR.Todd-mb

## 2010-09-24 NOTE — Progress Notes (Signed)
Summary: Pt Atenolol 50mg  1 tab in a.m. x 2 to [facility] Note Call from Patient Call back at [facility] Phone [phone]   Caller: Patient Summary of Call: Pt called and is req refill of Atenolol 50mg  1 tab in a.m.  Pt req 2 refills be called in to [facility]. Pt would like to pick this up tomorrow 06/13/10.   Pt would like to be notified when this has been called in to pharmacy.  Initial call taken by: Lucy Antigua,  June 12, 2010 3:46 PM    Prescriptions: ATENOLOL 100 MG TABS (ATENOLOL) take one tab once daily  #100 x 2   Entered by:   Kern Reap CMA (AAMA)   Authorized by:   Roderick Pee MD   Signed by:   Kern Reap CMA (AAMA) on 06/13/2010   Method used:   Electronically to        [facility]* (retail)       [address]       [place], [place]  [id number]       Ph: [phone]       Fax: [phone]   RxID:   [id number]

## 2010-09-24 NOTE — Op Note (Signed)
Summary: Colonoscopy and biopsy                         [facility]  Patient:    Jason Farmer, Jason Farmer                     MRN: [id number] Proc. Date: 12/08/00 Adm. Date:  [id number] Attending:  Louie Bun CC:         Oley Balm. Georgina Pillion, M.D.   Procedure Report  PROCEDURE:  Colonoscopy with polypectomy.  INDICATION FOR PROCEDURE:  History of adenomatous colon polyps on index colonoscopy three years ago.  DESCRIPTION OF PROCEDURE:  The patient was placed in the left lateral decubitus position and placed on the pulse monitor with continuous low flow oxygen delivered by nasal cannula. He was sedated with 75 mg IV Demerol and 6.5 mg IV Versed. The Olympus video colonoscope was inserted into the rectum and advanced to the cecum, confirmed by transillumination at McBurneys point and visualization of the ileocecal valve and appendiceal orifice. The prep was excellent. The cecum appeared normal. Within the ascending colon, there was a patchy sessile 1-1.2 cm in diameter polyp without any stalk. This was fulgurated by hot biopsy. The remainder of the ascending, transverse, descending, sigmoid and rectum all appeared normal with no further polyps or masses. There were some diverticula noted in the sigmoid colon. The colonoscope was then withdrawn and the patient returned to the recovery room in stable condition. The patient tolerated the procedure well and there were no immediate complications.  IMPRESSION: 1. Ascending colon polyp. 2. Left sided diverticulosis.  PLAN:  Await histology and will probably pursue repeat colonoscopy in 3-5 years. DD:  12/08/00 TD:  12/09/00 Job: [id number]     SP Surgical Pathology - STATUS: Final             By: Guilford Shi MD , Clovis Pu       Perform Date: 16Apr02 12:48  Ordered By: Gwenyth Ober,            Ordered Date:  Facility: [facility]                              Department: CPATH  Service Report Text  [facility]   [address] [place], [place] [address]   [phone]    REPORT OF SURGICAL PATHOLOGY    Case #: [id number]   Patient Name: Jason Farmer, Jason Farmer   PID: [id number]   Pathologist: Marcie Bal, MD   DOB/Age [birth year] (Age: 76) Gender: M   Date Taken: 12/08/2000   Date Received: 12/08/2000    FINAL DIAGNOSIS    ***MICROSCOPIC EXAMINATION AND DIAGNOSIS***    COLON: HYPERPLASTIC POLYP. NO ADENOMATOUS CHANGE OR MALIGNANCY   IDENTIFIED.    smr   Date Reported: 12/09/2000 Marcie Bal, MD   *** Electronically Signed Out By TAZ ***    specimen(s) obtained   Ascending colon, polyp    Gross Description   Received in formalin is a tan, soft tissue fragment that is   submitted en toto. Size: 0.2 cm (GRP:atb 4/16)    ab/

## 2010-09-24 NOTE — Progress Notes (Signed)
Summary: REQ FOR RETURN CALL  Phone Note Call from Patient   Caller: Patient Summary of Call: Pt called to adv that the med that he was given for c/o cramps (Mirapex 0.5 Mg Tabs (Pramipexole dihydrochloride) makes him dizzy, causes insomnia, and doesn't relieve pain from cramps.... Pt would like to know if there is another med that he could try in place of the Mirapex.Marland KitchenMarland KitchenMarland KitchenPt would like to speak wtih Dr Tawanna Cooler about med change or what to do.... Pt was offered an OV but he said that he has already been in twice for the same problem.  Pt can be reached at [phone] with any questions or concerns. Initial call taken by: Debbra Riding,  November 20, 2009 12:02 PM  Follow-up for Phone Call        since the medication did not help, and he is having side effects would recommend he call and get a consult from [facility].  Neurologic Follow-up by: Roderick Pee MD,  November 20, 2009 1:08 PM  Additional Follow-up for Phone Call Additional follow up Details #1::        Called and left msg for pt to c/b... msg was left on peronally identified a/m.   LMTCB ---- Debbra Riding, November 21, 2009 8:16AM   Additional Follow-up by: Debbra Riding,  November 20, 2009 1:28 PM    Additional Follow-up for Phone Call Additional follow up Details #2::    Phone Call Completed----Left msg adv of Dr Nelida Meuse instructions on personally identified a/m..... Adv to c/b if any questions or concerns.   Follow-up by: Debbra Riding,  November 21, 2009 9:26 AM

## 2010-09-26 NOTE — Letter (Signed)
Summary: Patient Notice- Polyp Results  [facility]  [address] [place], [place] [address]   Phone: [phone]  Fax: [phone]        September 17, 2010 MRN: [id number]    [facility] [address] [place], [place]  [address]    Dear Mr. Ho,  I am pleased to inform you that the colon polyp(s) removed during your recent colonoscopy was (were) found to be benign (no cancer detected) upon pathologic examination.  I recommend you have a repeat colonoscopy examination in 5 years to look for recurrent polyps, as having colon polyps increases your risk for having recurrent polyps or even colon cancer in the future.  Should you develop new or worsening symptoms of abdominal pain, bowel habit changes or bleeding from the rectum or bowels, please schedule an evaluation with either your primary care physician or with me.  Continue treatment plan as outlined the day of your exam.  Please call us if you are having persistent problems or have questions about your condition that have not been fully answered at this time.  Sincerely,  Meryl Dare MD [facility]  This letter has been electronically signed by your physician.

## 2010-09-26 NOTE — Assessment & Plan Note (Signed)
Summary: LONG STANDING REFLUX/FOOD NOW GETTING STUCK/YF   History of Present Illness Visit Type: Initial Consult Primary GI MD: Elie Goody MD [facility] Primary Provider: Kelle Darting, MD Requesting Provider: Kelle Darting, MD Chief Complaint: Reflux and dysphagia History of Present Illness:   This is a 76 year old male who relates a 3 year history of recurrent problems with substernal burning. He takes Prilosec OTC intermittently and feels that it is not very effective. He notes solid food dysphagia particularly with red meat over the past several months. He has a prior history of adenomatous colon polyps and states his last colonoscopy was in 2007 by Dr. Madilyn Fireman.    GI Review of Systems    Reports abdominal pain, acid reflux, chest pain, dysphagia with solids, and  heartburn.      Denies belching, bloating, dysphagia with liquids, loss of appetite, nausea, vomiting, vomiting blood, weight loss, and  weight gain.      Reports hemorrhoids.     Denies anal fissure, black tarry stools, change in bowel habit, constipation, diarrhea, diverticulosis, fecal incontinence, heme positive stool, irritable bowel syndrome, jaundice, light color stool, liver problems, rectal bleeding, and  rectal pain. Preventive Screening-Counseling & Management  Alcohol-Tobacco     Smoking Status: quit   Current Medications (verified): 1)  Crestor 10 Mg Tabs (Rosuvastatin Calcium) .... Take One Tab Once Daily 2)  Alprazolam 0.5 Mg Tabs (Alprazolam) .... Take One Tab Once Daily As Needed 3)  Atenolol 50 Mg Tabs (Atenolol) .... Take One Tab By Mouth Once Daily  Allergies (verified): No Known Drug Allergies  Past History:  Past Medical History: Reviewed history from 08/01/2010 and no changes required. hx jaundice as a child leg cramping seasonal allergies Adenomatous Colon Polyps 1999 Diverticulosis  Past Surgical History: left knee Appendectomy Tonsillectomy Hemorrhoidectomy neck  Family  History: Reviewed history from 08/23/2009 and no changes required. Father: deceased - demenia Mother: deceased  Siblings: brother - healthy Chidren: 2 boys, 2 girls - healthy No FH of Colon Cancer:  Social History: Reviewed history from 08/23/2009 and no changes required. Retired Married Alcohol use-no Drug use-no Patient is a former smoker.  Smoking Status:  quit  Review of Systems       The patient complains of arthritis/joint pain, shortness of breath, and voice change.         The pertinent positives and negatives are noted as above and in the HPI. All other ROS were reviewed and were negative.  Vital Signs:  Patient profile:   76 year old male Height:      65 inches Weight:      163 pounds BMI:     27.22 Pulse rate:   80 / minute Pulse rhythm:   regular BP sitting:   130 / 80  (left arm)  Vitals Entered By: Milford Cage NCMA (August 06, 2010 9:30 AM)  Physical Exam  General:  Well developed, well nourished, no acute distress. Head:  Normocephalic and atraumatic. Eyes:  PERRLA, no icterus. Mouth:  No deformity or lesions, dentition normal. Chest Wall:  Symmetrical,  no deformities . Lungs:  Clear throughout to auscultation. Heart:  Regular rate and rhythm; no murmurs, rubs,  or bruits. Abdomen:  Soft, nontender and nondistended. No masses, hepatosplenomegaly or hernias noted. Normal bowel sounds. Rectal:  deferred until time of colonoscopy.   Pulses:  Normal pulses noted. Extremities:  No clubbing, cyanosis, edema or deformities noted. Neurologic:  Alert and  oriented x4;  grossly normal neurologically. Cervical Nodes:  No  significant cervical adenopathy. Inguinal Nodes:  No significant inguinal adenopathy. Psych:  Alert and cooperative. Normal mood and affect.  Impression & Recommendations:  Problem # 1:  DYSPHAGIA (VHQ-469.62) New-onset solid food dysphagia. Suspected esophageal stricture. Rule out neoplasm and esophagitis. Change to omeprazole 40 mg  q.a.m. and begin standard antireflux measures. The risks, benefits and alternatives to endoscopy with possible biopsy and possible dilation were discussed with the patient and they consent to proceed. The procedure will be scheduled electively. Orders: Endo Savary  Dil/Colon (Endo Sav Dil/Col)  Problem # 2:  GERD (ICD-530.81) As in problem #1. Orders: Endo Savary  Dil/Colon (Endo Sav Dil/Col)  Problem # 3:  PERSONAL HX COLONIC POLYPS (ICD-V12.72) Personal history of adenomatous colon polyps, initially diagnosed in 1999. He is due for 5 year surveillance colonoscopy and he would like to proceed at the time of his upper endoscopy. Will attempt to obtain prior records from Dr. Madilyn Fireman.  The risks, benefits and alternatives to colonoscopy with possible biopsy and possible polypectomy were discussed with the patient and they consent to proceed. The procedure will be scheduled electively. Orders: Endo Savary  Dil/Colon (Endo Sav Dil/Col)  Patient Instructions: 1)  Pick up your prescription from your pharmacy.  2)  Colonoscopy brochure given.  3)  Upper Endoscopy with Dilatation brochure given.  4)  Avoid foods high in acid content ( tomatoes, citrus juices, spicy foods) . Avoid eating within 3 to 4 hours of lying down or before exercising. Do not over eat; try smaller more frequent meals. Elevate head of bed four inches when sleeping.  5)  Copy sent to : Kelle Darting, MD 6)  The medication list was reviewed and reconciled.  All changed / newly prescribed medications were explained.  A complete medication list was provided to the patient / caregiver.  Prescriptions: MOVIPREP 100 GM  SOLR (PEG-KCL-NACL-NASULF-NA ASC-C) As per prep instructions.  #1 x 0   Entered by:   Christie Nottingham CMA (AAMA)   Authorized by:   Meryl Dare MD [facility]   Signed by:   Christie Nottingham CMA (AAMA) on 08/06/2010   Method used:   Electronically to        [facility]* (retail)       [address]        [place], [place]  [id number]       Ph: [phone]       Fax: [phone]   RxID:   [id number] OMEPRAZOLE 40 MG CPDR (OMEPRAZOLE) one capsule by mouth once daily  #30 x 11   Entered by:   Christie Nottingham CMA (AAMA)   Authorized by:   Meryl Dare MD [facility]   Signed by:   Christie Nottingham CMA Duncan Dull) on 08/06/2010   Method used:   Electronically to        [facility]* (retail)       [address]       [place], [place]  [id number]       Ph: [phone]       Fax: [phone]   RxID:   [id number]

## 2010-09-26 NOTE — Procedures (Addendum)
Summary: Upper Endoscopy w/DIL  Patient: Jason Farmer Note: All result statuses are Final unless otherwise noted.  Tests: (1) Upper Endoscopy w/DIL (UED)  UED Upper Endoscopy w/DIL                             DONE     [facility]     [address]     [place], [place]  [address]           ENDOSCOPY PROCEDURE REPORT     PATIENT:  Jason, Farmer  MR#:  [id number]     BIRTHDATE:  09/08/1934, 75 yrs. old  GENDER:  male     ENDOSCOPIST:  Judie Petit T. Russella Dar, MD, [facility]     Referred by:  Eugenio Hoes Tawanna Cooler, M.D.     PROCEDURE DATE:  09/11/2010     PROCEDURE:  EGD with dilatation over guidewire, 43248, EGD with     biopsy, 43239     ASA CLASS:  Class II     INDICATIONS:  dysphagia, GERD     MEDICATIONS:  There was residual sedation effect present from     prior procedure, Fentanyl 25 mcg IV, Versed 1 mg IV     TOPICAL ANESTHETIC:  Exactacain Spray     DESCRIPTION OF PROCEDURE:   After the risks benefits and     alternatives of the procedure were thoroughly explained, informed     consent was obtained.  The LB GIF-H180 [id number] endoscope was     introduced through the mouth and advanced to the second portion of     the duodenum, without limitations.  The instrument was slowly     withdrawn as the mucosa was fully examined.     <<PROCEDUREIMAGES>>     A stricture was found at the gastroesophageal junction. It was     benign appearing and circumferential. It was 13 mm in diameter.     Multiple biopsies were obtained and sent to pathology.  Savary /     guidewire: 15 mm, 16 mm, 17 mm with minimal resistance will all     dilators and trace heme on the last dilator. Otherwise normal     esophagus. The stomach was entered and closely examined. The     pylorus, antrum, angularis, and lesser curvature were well     visualized, including a retroflexed view of the cardia and fundus.     The stomach wall was normally distensable. The scope passed easily     through the pylorus into the  duodenum. The duodenal bulb was     normal in appearance, as was the postbulbar duodenum. Retroflexed     views revealed a hiatal hernia, small.  The scope was then     withdrawn from the patient and the procedure completed.     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 Russella Dar, MD, [facility]           CC:  Eugenio Hoes. Tawanna Cooler, M.D.  n.     eSIGNED:   Musa Rewerts T. Mauri Tolen at 09/11/2010 02:55 PM           [name], [id number]  Note: An exclamation mark (!) indicates a result that was not dispersed into the flowsheet. Document Creation Date: 09/11/2010 2:55 PM _______________________________________________________________________  (1) Order result status: Final Collection or observation date-time: 09/11/2010 14:47 Requested date-time:  Receipt date-time:  Reported date-time:  Referring Physician:   Ordering Physician: Claudette Head [id number]) Specimen Source:  Source: Launa Grill Order Number: [id number] Lab site:

## 2010-09-26 NOTE — Procedures (Addendum)
Summary: Colonoscopy  Patient: Jason Farmer Note: All result statuses are Final unless otherwise noted.  Tests: (1) Colonoscopy (COL)   COL Colonoscopy           DONE     [facility]     [address]     [place], [place]  [address]           COLONOSCOPY PROCEDURE REPORT     PATIENT:  [name], [name]  MR#:  [id number]     BIRTHDATE:  1934/08/26, 75 yrs. old  GENDER:  male     ENDOSCOPIST:  Judie Petit T. Russella Dar, MD, [facility]     Referred by:  Eugenio Hoes Tawanna Cooler, M.D.     PROCEDURE DATE:  09/11/2010     PROCEDURE:  Colonoscopy with snare polypectomy     ASA CLASS:  Class II     INDICATIONS:  1) surveillance and high-risk screening  2) history     of pre-cancerous (adenomatous) colon polyps     MEDICATIONS:   Fentanyl 75 mcg IV, Versed 9 mg IV     DESCRIPTION OF PROCEDURE:   After the risks benefits and     alternatives of the procedure were thoroughly explained, informed     consent was obtained.  Digital rectal exam was performed and     revealed no abnormalities.   The LB PCF-H180AL [id number] endoscope     was introduced through the anus and advanced to the cecum, which     was identified by both the appendix and ileocecal valve, without     limitations.  The quality of the prep was excellent, using     MoviPrep.  The instrument was then slowly withdrawn as the colon     was fully examined.     <<PROCEDUREIMAGES>>     FINDINGS:  A sessile polyp was found in the mid transverse colon.     It was 6 mm in size. Polyp was snared without cautery. Retrieval     was successful. Mild diverticulosis was found in the sigmoid     colon. A normal appearing cecum, ileocecal valve, and appendiceal     orifice were identified. The ascending, hepatic flexure, splenic     flexure, descending colon, and rectum appeared unremarkable.     Retroflexed views in the rectum revealed no abnormalities.  The     time to cecum =  2  minutes. The scope was then withdrawn (time =     8.33  min) from the  patient and the procedure completed.           COMPLICATIONS:  None           ENDOSCOPIC IMPRESSION:     1) 6 mm sessile polyp in the mid transverse colon     2) Mild diverticulosis in the sigmoid colon           RECOMMENDATIONS:     1) Await pathology results     2) High fiber diet with liberal fluid intake.     3) Repeat Colonoscopy in 5 years pending pathology review.           [name]. Russella Dar, MD, [facility]           CC: Roderick Pee, MD           n.     [name]. Maryann Mccall at 09/11/2010 02:41 PM           [name], [id number]  Note: An exclamation mark (!) indicates a result that was not dispersed into the flowsheet. Document Creation Date: 09/11/2010 2:41 PM _______________________________________________________________________  (1) Order result status: Final Collection or observation date-time: 09/11/2010 14:34 Requested date-time:  Receipt date-time:  Reported date-time:  Referring Physician:   Ordering Physician: Claudette Head [phone]) Specimen Source:  Source: Launa Grill Order Number: [id number] Lab site:   Appended Document: Colonoscopy     Procedures Next Due Date:    Colonoscopy: 08/2015

## 2010-09-26 NOTE — Procedures (Signed)
Summary: Colonoscopy/[facility]  Colonoscopy/[facility]   Imported By: Sherian Rein 08/21/2010 15:04:38  _____________________________________________________________________  External Attachment:    Type:   Image     Comment:   External Document

## 2010-09-26 NOTE — Letter (Signed)
Summary: Patient [facility] Biopsy Results  [facility]  [address] [place], [place] [address]   Phone: [phone]  Fax: [phone]        September 17, 2010 MRN: [id number]    [facility] [address] [place], [place]  [address]    Dear Mr. Hemann,  I am pleased to inform you that the biopsies taken during your recent endoscopic examination did not show any evidence of cancer upon pathologic examination. The biopsies showed mild inflammation consistent with GERD.  Continue with the treatment plan as outlined on the day of your      exam.  Please call us if you are having persistent problems or have questions about your condition that have not been fully answered at this time.  Sincerely,  Meryl Dare MD [facility]  This letter has been electronically signed by your physician.  Appended Document: Patient Notice-Endo Biopsy Results LETTER MAILED  Appended Document: Patient Notice-Endo Biopsy Results LETTER MAILED

## 2010-09-26 NOTE — Letter (Signed)
Summary: [facility] Instructions  [facility]  [address] [place], [place] [address]   Phone: [phone]  Fax: [phone]       Jason Farmer    [birth year]    MRN: [id number]        Procedure Day Dorna Bloom: Wednesday January 18th, 2012     Arrival Time: 1:00pm     Procedure Time: 2:00pm     Location of Procedure:                    _ x_  [facility] (4th Floor)                        PREPARATION FOR COLONOSCOPY WITH MOVIPREP   Starting 5 days prior to your procedure 09/06/10 do not eat nuts, seeds, popcorn, corn, beans, peas,  salads, or any raw vegetables.  Do not take any fiber supplements (e.g. Metamucil, Citrucel, and Benefiber).  THE DAY BEFORE YOUR PROCEDURE         DATE: 09/10/10  DAY: Tuesday  1.  Drink clear liquids the entire day-NO SOLID FOOD  2.  Do not drink anything colored red or purple.  Avoid juices with pulp.  No orange juice.  3.  Drink at least 64 oz. (8 glasses) of fluid/clear liquids during the day to prevent dehydration and help the prep work efficiently.  CLEAR LIQUIDS INCLUDE: Water Jello Ice Popsicles Tea (sugar ok, no milk/cream) Powdered fruit flavored drinks Coffee (sugar ok, no milk/cream) Gatorade Juice: apple, white grape, white cranberry  Lemonade Clear bullion, consomm, broth Carbonated beverages (any kind) Strained chicken noodle soup Hard Candy                             4.  In the morning, mix first dose of MoviPrep solution:    Empty 1 Pouch A and 1 Pouch B into the disposable container    Add lukewarm drinking water to the top line of the container. Mix to dissolve    Refrigerate (mixed solution should be used within 24 hrs)  5.  Begin drinking the prep at 5:00 p.m. The MoviPrep container is divided by 4 marks.   Every 15 minutes drink the solution down to the next mark (approximately 8 oz) until the full liter is complete.   6.  Follow completed prep with 16 oz of clear liquid of your  choice (Nothing red or purple).  Continue to drink clear liquids until bedtime.  7.  Before going to bed, mix second dose of MoviPrep solution:    Empty 1 Pouch A and 1 Pouch B into the disposable container    Add lukewarm drinking water to the top line of the container. Mix to dissolve    Refrigerate  THE DAY OF YOUR PROCEDURE      DATE: 09/11/10 DAY: Wednesday  Beginning at 9:00 a.m. (5 hours before procedure):         1. Every 15 minutes, drink the solution down to the next mark (approx 8 oz) until the full liter is complete.  2. Follow completed prep with 16 oz. of clear liquid of your choice.    3. You may drink clear liquids until 12:00 (2 HOURS BEFORE PROCEDURE).   MEDICATION INSTRUCTIONS  Unless otherwise instructed, you should take regular prescription medications with a small sip of water   as early as possible the morning of your  procedure.        OTHER INSTRUCTIONS  You will need a responsible adult at least 74 years of age to accompany you and drive you home.   This person must remain in the waiting room during your procedure.  Wear loose fitting clothing that is easily removed.  Leave jewelry and other valuables at home.  However, you may wish to bring a book to read or  an iPod/MP3 player to listen to music as you wait for your procedure to start.  Remove all body piercing jewelry and leave at home.  Total time from sign-in until discharge is approximately 2-3 hours.  You should go home directly after your procedure and rest.  You can resume normal activities the  day after your procedure.  The day of your procedure you should not:   Drive   Make legal decisions   Operate machinery   Drink alcohol   Return to work  You will receive specific instructions about eating, activities and medications before you leave.    The above instructions have been reviewed and explained to me by   Marchelle Folks.     I fully understand and can verbalize these  instructions _____________________________ Date _________

## 2010-10-07 ENCOUNTER — Encounter: Payer: Self-pay | Admitting: Family Medicine

## 2010-10-07 ENCOUNTER — Ambulatory Visit (INDEPENDENT_AMBULATORY_CARE_PROVIDER_SITE_OTHER): Payer: Medicare Other | Admitting: Family Medicine

## 2010-10-07 DIAGNOSIS — R0602 Shortness of breath: Secondary | ICD-10-CM

## 2010-10-07 DIAGNOSIS — E785 Hyperlipidemia, unspecified: Secondary | ICD-10-CM

## 2010-10-07 DIAGNOSIS — R1319 Other dysphagia: Secondary | ICD-10-CM

## 2010-10-07 NOTE — Progress Notes (Signed)
  Subjective:    Patient ID: Jason Farmer, male    DOB: 10/16/1934, 76 y.o.   MRN: [id number]  HPI  Is a 76 year old male, who comes in today for follow-up of 3 problems.  He has a history of difficulty swallowing.  I referred him to Dr. Russella Dar.  They found a stricture, which is a dilated.  The also do colonoscopy, which showed one polyp and some diverticuli.  He was told to take omeprazole 40 mg daily forever, and we also discussed mechanical things for reflux like not eating for 3 hours prior to bedtime.  Six years ago.  He had surgical do surgery at [facility].  He does not recall the surgeon.  The numbness and briefly went away and, now it's back.  He would like to go back and see his neurosurgeon.  Advised to call, since he's been there before does not need a referral.  He also complains of generalized muscle pain and weakness and cramps.  He thinks is related to the Crestor.  Advised him to stop the Crestor for one month and then go back and take one tablet on Monday and Thursday.  In the past when he is going status completely.  His LDL cholesterol was extremely high.  He had a cardiac workup, which included a stress test and echocardiogram.  Both of which are normal.  Review of Systems Negative    Objective:   Physical Exam    Well-developed well-nourished, male in no acute distress    Assessment & Plan:  Number one,,,,,,,, reflux esophagitis,,,,,,,, outlined plan, which includes nothing to eat or drink for 3 hours prior to bedtime and daily omeprazole.  Numbness right arm.  Plan go back to see a neurosurgeon, who did your surgery 6 years ago for the same problem.  Muscle pain from statin.  Plan advised to stop the Crestor, x 1 month, then restart by taking one tablet Monday, and Thursday.  Advise return annually for his annual checkup and

## 2010-10-07 NOTE — Patient Instructions (Signed)
Continue the antireflux medications Prilosec, one daily, and the anti-reflex measures, which include no caffeine, and nothing to eat or drink for 3 hours before bedtime.  It's also helpful to sleep on two pillows.  Call the neurosurgeon's office to get set up for further evaluation of the numbness in her right arm.  Stop the Crestor for one month and then restarted by taking one tablet on Monday and Thursday only.  Return annually for your annual checkup

## 2011-01-10 NOTE — Op Note (Signed)
NAME:  Jason Farmer, Jason Farmer NO.:  [id number]   MEDICAL RECORD NO.:  [id number]          PATIENT TYPE:  INP   LOCATION:  3034                         FACILITY:  [facility]   PHYSICIAN:  Danae Orleans. Venetia Maxon, M.D.  DATE OF BIRTH:  [birth year]   DATE OF PROCEDURE:  12/12/2005  DATE OF DISCHARGE:  12/13/2005                                 OPERATIVE REPORT   PREOPERATIVE DIAGNOSIS:  Herniated cervical disk with cervical spondylosis,  degenerative disk disease and radiculopathy at C5-6 and C6-7.   POSTOPERATIVE DIAGNOSIS:  Herniated cervical disk with cervical spondylosis,  degenerative disk disease and radiculopathy at C5-6 and C6-7.   PROCEDURE:  Anterior cervical decompression and fusion C5-6 and C6-7 levels  with PEEK interbody cages and more local bone autograft with anterior  cervical plate.   SURGEON:  Danae Orleans. Venetia Maxon, M.D.   ASSISTANT:  Cristi Loron, M.D.   ANESTHESIA:  General endotracheal anesthesia.   ESTIMATED BLOOD LOSS:  Minimal.   COMPLICATIONS:  None.   DISPOSITION:  Recovery.   INDICATIONS:  Jason Farmer is a 76 year old man with marked spondylosis,  degenerative disk disease and significant foraminal stenosis at C5-6 and C6-  7 levels with bilateral upper extremity weakness.  It was elected to take  him to surgery for anterior cervical decompression and fusion at the C5-6  and C6-7 levels.   PROCEDURE:  Jason Farmer was brought to the operating room.  Following the  satisfactory and uncomplicated induction of general endotracheal anesthesia  and placement of intravenous lines, the patient was placed in a supine  position on the operating table.  His neck was placed in slight extension,  he was placed in 10 pounds of halter traction.  His anterior neck was then  prepped and draped in the usual sterile fashion.  Area of the planned  incision was infiltrated with 0.25% Marcaine, 0.5% lidocaine, 1:200,000  epinephrine.  An incision was made from the  midline to the anterior border  of the sternocleidomastoid muscle on the left side of midline and platysma  layer was sharply divided.  Subplatysmal dissection was performed, exposing  the anterior border of the sternocleidomastoid muscle; using blunt  dissection, the carotid sheath was kept lateral, trachea and esophagus kept  medial.  A bent spinal needle was placed at what was felt to be the C5-6  level and this was confirmed on intraoperative x-ray.  Subsequently, longus  coli muscles were taken down from the anterior cervical spine from C5-C7  levels bilaterally using electrocautery and Key elevator and self-retaining  Shadow-Line retractors were placed along with up/down retractors.  Large  ventral osteophytes were removed with a Leksell rongeur at each of these  levels and the osteophytes, which were cleaned of __________ soft tissue and  saved for later use as in bone grafting.  Subsequently, the interspaces at  C5-6 and C6-7 were then incised with a 15 blade and disk material was  removed in a piecemeal fashion.  A variety of Carlens curets were used to  strip the endplates of residual disk material.  A disk space spreader was  placed initially at the C6-7 level.  The microscope was brought into the  field.  Using high-powered microscopic visualization and a high speed drill,  the endplates of C6 and C7 were decorticated and large uncinate spurs were  removed.  The bone removed was retained for use in later bone grafting  within the PEEK cages.  Subsequently, uncinate spurs were removed to the  dura __________ spinal cord.  Dura was decompressed as were both C7 nerve  roots.  Hemostasis was assured and after trial sizing a 7 mm PEEK interbody  cage was selected, packed with morselized bone autograft, inserted in the  interspace and countersunk appropriately.  Attention was then turned to the  C5-6 level where a similar decompression was performed and uncinate spurs  were removed  and both C6 nerve roots and central spinal cord dura were  widely decompressed.  After trial sizing, a 6 mm PEEK cage was selected,  packed with morselized bone autograft, inserted in the interspace and  countersunk appropriately.  A 31 mm anterior cervical plate was then affixed  to the anterior cervical spine with 14 mm variable angle screws.  After the  weight, traction weight, was removed, the 4 x 14 mm variable angle screws  were placed, two at C5, two at C6, two at C7.  All screws had excellent  purchase and locking mechanism was engaged after final x-ray demonstrated  well positioned interbody grafts and anterior cervical plate.  The wound was  copiously irrigated with bacitracin and saline.  Soft tissues were inspected  and found to be in good repair.  Hemostasis was assured.  The platysmal  layer was closed with #3-0 Vicryl sutures, the skin edges were  reapproximated with interrupted #3-0 Vicryl subcuticular stitch.  The wound  was dressed with Dermabond.  The patient was extubated in the operating  room, taken to the recovery room in stable and satisfactory condition,  having tolerated his operation well.  All counts were correct at the end of  the case.      Danae Orleans. Venetia Maxon, M.D.  Electronically Signed     JDS/MEDQ  D:  12/12/2005  T:  12/14/2005  Job:  [id number]

## 2011-01-10 NOTE — Procedures (Signed)
[facility]  Patient:    Jason Farmer, Jason Farmer                     MRN: [id number] Proc. Date: 12/08/00 Adm. Date:  [id number] Attending:  Louie Bun CC:         Oley Balm. Georgina Pillion, M.D.   Procedure Report  PROCEDURE:  Colonoscopy with polypectomy.  INDICATION FOR PROCEDURE:  History of adenomatous colon polyps on index colonoscopy three years ago.  DESCRIPTION OF PROCEDURE:  The patient was placed in the left lateral decubitus position and placed on the pulse monitor with continuous low flow oxygen delivered by nasal cannula. He was sedated with 75 mg IV Demerol and 6.5 mg IV Versed. The Olympus video colonoscope was inserted into the rectum and advanced to the cecum, confirmed by transillumination at McBurneys point and visualization of the ileocecal valve and appendiceal orifice. The prep was excellent. The cecum appeared normal. Within the ascending colon, there was a patchy sessile 1-1.2 cm in diameter polyp without any stalk. This was fulgurated by hot biopsy. The remainder of the ascending, transverse, descending, sigmoid and rectum all appeared normal with no further polyps or masses. There were some diverticula noted in the sigmoid colon. The colonoscope was then withdrawn and the patient returned to the recovery room in stable condition. The patient tolerated the procedure well and there were no immediate complications.  IMPRESSION: 1. Ascending colon polyp. 2. Left sided diverticulosis.  PLAN:  Await histology and will probably pursue repeat colonoscopy in 3-5 years. DD:  12/08/00 TD:  12/09/00 Job: [id number]

## 2011-08-27 ENCOUNTER — Encounter: Payer: Medicare Other | Admitting: Family Medicine

## 2011-09-03 ENCOUNTER — Ambulatory Visit (INDEPENDENT_AMBULATORY_CARE_PROVIDER_SITE_OTHER): Payer: Medicare Other | Admitting: Family Medicine

## 2011-09-03 ENCOUNTER — Encounter: Payer: Self-pay | Admitting: Family Medicine

## 2011-09-03 DIAGNOSIS — N138 Other obstructive and reflux uropathy: Secondary | ICD-10-CM

## 2011-09-03 DIAGNOSIS — N401 Enlarged prostate with lower urinary tract symptoms: Secondary | ICD-10-CM | POA: Diagnosis not present

## 2011-09-03 DIAGNOSIS — Z23 Encounter for immunization: Secondary | ICD-10-CM

## 2011-09-03 DIAGNOSIS — K219 Gastro-esophageal reflux disease without esophagitis: Secondary | ICD-10-CM | POA: Diagnosis not present

## 2011-09-03 DIAGNOSIS — Z0000 Encounter for general adult medical examination without abnormal findings: Secondary | ICD-10-CM

## 2011-09-03 DIAGNOSIS — G250 Essential tremor: Secondary | ICD-10-CM | POA: Diagnosis not present

## 2011-09-03 DIAGNOSIS — N139 Obstructive and reflux uropathy, unspecified: Secondary | ICD-10-CM

## 2011-09-03 DIAGNOSIS — E785 Hyperlipidemia, unspecified: Secondary | ICD-10-CM | POA: Diagnosis not present

## 2011-09-03 DIAGNOSIS — G252 Other specified forms of tremor: Secondary | ICD-10-CM

## 2011-09-03 DIAGNOSIS — F411 Generalized anxiety disorder: Secondary | ICD-10-CM

## 2011-09-03 LAB — CBC WITH DIFFERENTIAL/PLATELET
Basophils Absolute: 0 10*3/uL (ref 0.0–0.1)
Basophils Relative: 0.3 % (ref 0.0–3.0)
Eosinophils Absolute: 0.1 10*3/uL (ref 0.0–0.7)
Eosinophils Relative: 2 % (ref 0.0–5.0)
HCT: 43.2 % (ref 39.0–52.0)
Hemoglobin: 14.9 g/dL (ref 13.0–17.0)
Lymphocytes Relative: 25.1 % (ref 12.0–46.0)
Lymphs Abs: 1.8 10*3/uL (ref 0.7–4.0)
MCHC: 34.4 g/dL (ref 30.0–36.0)
MCV: 90.8 fl (ref 78.0–100.0)
Monocytes Absolute: 0.5 10*3/uL (ref 0.1–1.0)
Monocytes Relative: 6.8 % (ref 3.0–12.0)
Neutro Abs: 4.7 10*3/uL (ref 1.4–7.7)
Neutrophils Relative %: 65.8 % (ref 43.0–77.0)
Platelets: 285 10*3/uL (ref 150.0–400.0)
RBC: 4.76 Mil/uL (ref 4.22–5.81)
RDW: 13.1 % (ref 11.5–14.6)
WBC: 7.2 10*3/uL (ref 4.5–10.5)

## 2011-09-03 LAB — POCT URINALYSIS DIPSTICK
Bilirubin, UA: NEGATIVE
Glucose, UA: NEGATIVE
Ketones, UA: NEGATIVE
Leukocytes, UA: NEGATIVE
Nitrite, UA: NEGATIVE
Spec Grav, UA: 1.015
Urobilinogen, UA: 0.2
pH, UA: 5.5

## 2011-09-03 LAB — BASIC METABOLIC PANEL
BUN: 17 mg/dL (ref 6–23)
CO2: 30 mEq/L (ref 19–32)
Calcium: 9.3 mg/dL (ref 8.4–10.5)
Chloride: 107 mEq/L (ref 96–112)
Creatinine, Ser: 1.4 mg/dL (ref 0.4–1.5)
GFR: 51.75 mL/min — ABNORMAL LOW (ref >60.00)
Glucose, Bld: 92 mg/dL (ref 70–99)
Potassium: 5.5 mEq/L — ABNORMAL HIGH (ref 3.5–5.1)
Sodium: 141 mEq/L (ref 135–145)

## 2011-09-03 LAB — LIPID PANEL
Cholesterol: 240 mg/dL — ABNORMAL HIGH (ref 0–200)
HDL: 40.2 mg/dL (ref >39.00)
Total CHOL/HDL Ratio: 6
Triglycerides: 118 mg/dL (ref 0.0–149.0)
VLDL: 23.6 mg/dL (ref 0.0–40.0)

## 2011-09-03 LAB — PSA: PSA: 2.53 ng/mL (ref 0.10–4.00)

## 2011-09-03 LAB — HEPATIC FUNCTION PANEL
ALT: 20 U/L (ref 0–53)
AST: 19 U/L (ref 0–37)
Albumin: 4 g/dL (ref 3.5–5.2)
Alkaline Phosphatase: 58 U/L (ref 39–117)
Bilirubin, Direct: 0.1 mg/dL (ref 0.0–0.3)
Total Bilirubin: 0.8 mg/dL (ref 0.3–1.2)
Total Protein: 6.5 g/dL (ref 6.0–8.3)

## 2011-09-03 LAB — TSH: TSH: 1.08 u[IU]/mL (ref 0.35–5.50)

## 2011-09-03 LAB — LDL CHOLESTEROL, DIRECT: Direct LDL: 168.9 mg/dL

## 2011-09-03 MED ORDER — ROSUVASTATIN CALCIUM 10 MG PO TABS: 10.0000 mg | ORAL_TABLET | Freq: Every day | ORAL | Status: DC

## 2011-09-03 MED ORDER — ATENOLOL 50 MG PO TABS: 50.0000 mg | ORAL_TABLET | Freq: Every day | ORAL | Status: DC

## 2011-09-03 MED ORDER — ALPRAZOLAM 0.5 MG PO TABS: 0.5000 mg | ORAL_TABLET | Freq: Every evening | ORAL | Status: DC | PRN

## 2011-09-03 MED ORDER — OMEPRAZOLE 40 MG PO CPDR: 40.0000 mg | DELAYED_RELEASE_CAPSULE | Freq: Every day | ORAL | Status: DC

## 2011-09-03 NOTE — Patient Instructions (Signed)
Continue your current medications.  Follow-up in one year, sooner if any problems 

## 2011-09-03 NOTE — Progress Notes (Signed)
  Subjective:    Patient ID: Jason Farmer, male    DOB: 1934/09/05, 77 y.o.   MRN: [id number]  HPI Jason Farmer is a 77 year old, married male, nonsmoker.......[employer], operational....... Who comes in today for general Medicare wellness examination.  He takes alprazolam .5 mg p.r.n. For anxiety attacks, which usually occur about 4 to 6 times per month.  He takes Tenormin 50 mg daily for an essential tremor.  He takes omeprazole 40 mg daily for reflux.  He takes Crestor 10 mg at bedtime for hyperlipidemia.  He gets routine eye care, hearing normal, regular dental care, colonoscopy, and GI, tetanus up-to-date, Pneumovax 2010, shingles at the [facility], seasonal flu shot today.  Cognitive function normal.  He walks on a daily basis.  Activities of daily living.  Normal, home health safety reviewed.  No issues identified, no guns in the house, he does have a health care power-of-attorney, and a living will   Review of Systems  Constitutional: Negative.   HENT: Negative.   Eyes: Negative.   Respiratory: Negative.   Cardiovascular: Negative.   Gastrointestinal: Negative.   Genitourinary: Negative.   Musculoskeletal: Negative.   Skin: Negative.   Neurological: Negative.   Hematological: Negative.   Psychiatric/Behavioral: Negative.        Objective:   Physical Exam  Constitutional: He is oriented to person, place, and time. He appears well-developed and well-nourished.  HENT:  Head: Normocephalic and atraumatic.  Right Ear: External ear normal.  Left Ear: External ear normal.  Nose: Nose normal.  Mouth/Throat: Oropharynx is clear and moist.  Eyes: Conjunctivae and EOM are normal. Pupils are equal, round, and reactive to light.  Neck: Normal range of motion. Neck supple. No JVD present. No tracheal deviation present. No thyromegaly present.  Cardiovascular: Normal rate, regular rhythm, normal heart sounds and intact distal pulses.  Exam reveals no gallop and no friction rub.   No  murmur heard. Pulmonary/Chest: Effort normal and breath sounds normal. No stridor. No respiratory distress. He has no wheezes. He has no rales. He exhibits no tenderness.  Abdominal: Soft. Bowel sounds are normal. He exhibits no distension and no mass. There is no tenderness. There is no rebound and no guarding.  Genitourinary: Rectum normal and penis normal. Guaiac negative stool. No penile tenderness.       2+ symmetrical.  BPH asymptomatic  Musculoskeletal: Normal range of motion. He exhibits no edema and no tenderness.  Lymphadenopathy:    He has no cervical adenopathy.  Neurological: He is alert and oriented to person, place, and time. He has normal reflexes. No cranial nerve deficit. He exhibits normal muscle tone.  Skin: Skin is warm and dry. No rash noted. No erythema. No pallor.  Psychiatric: He has a normal mood and affect. His behavior is normal. Judgment and thought content normal.          Assessment & Plan:  Healthy male.  Intentional tremor.  Continue Tenormin, 50 mg daily.  Episodes of anxiety.  Xanax .5 p.r.n.  Reflux esophagitis.  Continue Prilosec 40 mg daily.  History of hyperlipidemia.  Continue Crestor 10 mg nightly and a baby aspirin.  Follow-up in one year, sooner if any problems

## 2011-09-18 DIAGNOSIS — G5620 Lesion of ulnar nerve, unspecified upper limb: Secondary | ICD-10-CM | POA: Diagnosis not present

## 2011-09-18 DIAGNOSIS — G5600 Carpal tunnel syndrome, unspecified upper limb: Secondary | ICD-10-CM | POA: Diagnosis not present

## 2011-09-18 DIAGNOSIS — M6530 Trigger finger, unspecified finger: Secondary | ICD-10-CM | POA: Diagnosis not present

## 2011-09-25 ENCOUNTER — Telehealth: Payer: Self-pay | Admitting: *Deleted

## 2011-09-25 NOTE — Telephone Encounter (Signed)
Increase the Crestor to 20 mg daily and repeat fasting lipid panel in 2 months office visit a week after labs

## 2011-09-25 NOTE — Telephone Encounter (Signed)
Patient is concerned because his lipid has elevated since last year he would like a call.

## 2011-09-26 NOTE — Telephone Encounter (Signed)
Patient states he has been taking crestor 10 mg twice weekly.  I advised him to take his Crestor 10mg  daily.

## 2011-09-29 DIAGNOSIS — G5600 Carpal tunnel syndrome, unspecified upper limb: Secondary | ICD-10-CM | POA: Diagnosis not present

## 2011-09-29 DIAGNOSIS — M19049 Primary osteoarthritis, unspecified hand: Secondary | ICD-10-CM | POA: Diagnosis not present

## 2011-10-20 DIAGNOSIS — M19049 Primary osteoarthritis, unspecified hand: Secondary | ICD-10-CM | POA: Diagnosis not present

## 2011-10-27 DIAGNOSIS — M76899 Other specified enthesopathies of unspecified lower limb, excluding foot: Secondary | ICD-10-CM | POA: Diagnosis not present

## 2011-10-29 DIAGNOSIS — H26499 Other secondary cataract, unspecified eye: Secondary | ICD-10-CM | POA: Diagnosis not present

## 2012-01-27 ENCOUNTER — Telehealth: Payer: Self-pay | Admitting: Family Medicine

## 2012-01-27 ENCOUNTER — Encounter: Payer: Self-pay | Admitting: Family Medicine

## 2012-01-27 ENCOUNTER — Ambulatory Visit (INDEPENDENT_AMBULATORY_CARE_PROVIDER_SITE_OTHER): Payer: Medicare Other | Admitting: Family Medicine

## 2012-01-27 VITALS — BP 110/78 | Temp 98.7°F | Wt 166.0 lb

## 2012-01-27 DIAGNOSIS — W57XXXA Bitten or stung by nonvenomous insect and other nonvenomous arthropods, initial encounter: Secondary | ICD-10-CM | POA: Diagnosis not present

## 2012-01-27 DIAGNOSIS — T148 Other injury of unspecified body region: Secondary | ICD-10-CM

## 2012-01-27 DIAGNOSIS — T148XXA Other injury of unspecified body region, initial encounter: Secondary | ICD-10-CM | POA: Diagnosis not present

## 2012-01-27 NOTE — Patient Instructions (Signed)
If you develop secondary infection draining pus etc. call immediately

## 2012-01-27 NOTE — Telephone Encounter (Signed)
Called pt and schd him for 4:15 ov today as noted.

## 2012-01-27 NOTE — Telephone Encounter (Signed)
Pt says that there is a red area on arm that is 2 inches around that is increasing in size. Pt req work in [employer] today with Dr Tawanna Cooler.

## 2012-01-27 NOTE — Telephone Encounter (Signed)
Offer him 4:15 appointment

## 2012-01-27 NOTE — Progress Notes (Signed)
  Subjective:    Patient ID: Jason Farmer, male    DOB: 06/08/1935, 77 y.o.   MRN: [id number]  HPI Jason Farmer is a 77 year old male who comes in today for evaluation of a red area on his left biceps  About 4 or 5 days ago he noticed a slight red area it's increased in size. It measures 1" x 1" is red but no pus   Review of Systems    general review of systems otherwise negative Objective:   Physical Exam Well-developed well nourished and in no acute distress examination skin shows an erythematous lesion flat no pus measures 1 inch in diameter       Assessment & Plan:  Blood bite probably spider but no secondary infection observe

## 2012-03-02 DIAGNOSIS — M6530 Trigger finger, unspecified finger: Secondary | ICD-10-CM | POA: Diagnosis not present

## 2012-03-30 DIAGNOSIS — M6530 Trigger finger, unspecified finger: Secondary | ICD-10-CM | POA: Diagnosis not present

## 2012-04-06 ENCOUNTER — Ambulatory Visit (INDEPENDENT_AMBULATORY_CARE_PROVIDER_SITE_OTHER): Payer: Medicare Other | Admitting: Family Medicine

## 2012-04-06 ENCOUNTER — Encounter: Payer: Self-pay | Admitting: Family Medicine

## 2012-04-06 VITALS — BP 104/70 | Temp 98.4°F | Wt 162.0 lb

## 2012-04-06 DIAGNOSIS — J309 Allergic rhinitis, unspecified: Secondary | ICD-10-CM | POA: Diagnosis not present

## 2012-04-06 DIAGNOSIS — I10 Essential (primary) hypertension: Secondary | ICD-10-CM | POA: Diagnosis not present

## 2012-04-06 DIAGNOSIS — J302 Other seasonal allergic rhinitis: Secondary | ICD-10-CM

## 2012-04-06 MED ORDER — ATENOLOL 25 MG PO TABS: 25.0000 mg | ORAL_TABLET | Freq: Every day | ORAL | Status: DC

## 2012-04-06 MED ORDER — PREDNISONE 20 MG PO TABS: ORAL_TABLET | ORAL | Status: DC

## 2012-04-06 NOTE — Progress Notes (Signed)
  Subjective:    Patient ID: Jason Farmer, male    DOB: [birth year], 77 y.o.   MRN: [id number]  HPI Jason Farmer is a 77 year old married male nonsmoker who comes in today with a 3 month history of postnasal drip head congestion sinus pressure  He typically has allergy problems in the spring and the fall. He had a lot of allergy problems this past year until they got rid of the cat. No wheezing. Review of systems negative except as lost 10 pounds. BP 104/70 on Tenormin 50 mg daily and he is experiencing some lightheadedness when he stands up suddenly   Review of Systems General and metabolic review of systems otherwise negative    Objective:   Physical Exam Well-developed well-nourished male in no acute distress HEENT negative neck was supple no LAD mammography lungs are clear to auscultation cardiac exam normal       Assessment & Plan:  Allergic rhinitis plan prednisone burst and taper return in 3 weeks for followup  Hypertension however BP low 104/70 decrease the beta blocker to 25 mg a day followup in 3 weeks

## 2012-04-06 NOTE — Patient Instructions (Signed)
Hold the Tenormin tomorrow and restart on Thursday by taking 25 mg daily  BP check daily in the morning  Take the prednisone as directed  Return in 3 weeks for followup with a record of your daily blood pressure readings and the device

## 2012-04-28 ENCOUNTER — Encounter: Payer: Self-pay | Admitting: Family Medicine

## 2012-04-28 ENCOUNTER — Ambulatory Visit (INDEPENDENT_AMBULATORY_CARE_PROVIDER_SITE_OTHER): Payer: Medicare Other | Admitting: Family Medicine

## 2012-04-28 VITALS — BP 130/80 | Temp 98.1°F | Wt 164.0 lb

## 2012-04-28 DIAGNOSIS — G252 Other specified forms of tremor: Secondary | ICD-10-CM | POA: Diagnosis not present

## 2012-04-28 DIAGNOSIS — G250 Essential tremor: Secondary | ICD-10-CM | POA: Diagnosis not present

## 2012-04-28 DIAGNOSIS — J302 Other seasonal allergic rhinitis: Secondary | ICD-10-CM

## 2012-04-28 DIAGNOSIS — J309 Allergic rhinitis, unspecified: Secondary | ICD-10-CM | POA: Diagnosis not present

## 2012-04-28 MED ORDER — ATENOLOL 12.5 MG HALF TABLET: 12.5000 mg | ORAL_TABLET | Freq: Every day | ORAL | Status: DC

## 2012-04-28 NOTE — Progress Notes (Signed)
  Subjective:    Patient ID: ESTON HESLIN, male    DOB: August 31, 1934, 77 y.o.   MRN: [id number]  HPI Kahlel is a 77 year old married male nonsmoker who comes in today for evaluation of allergic rhinitis and benign tremor  We have been tapering his beta blocker because he was having side effects. He's now on 25 mg of atenolol and still feels lightheaded. BP today 130/80  We gave him a short course of prednisone for his allergic rhinitis however didn't seem to help much. Advised to take a plane 10 mg Zyrtec at bedtime   Review of Systems    general ENT and cardiovascular review of systems otherwise negative Objective:   Physical Exam Well-developed and nourished man no acute distress BP right arm sitting position 130/80       Assessment & Plan:  Essential tremor decrease beta blocker to 12.5 mg a day  Allergic rhinitis playing Zyrtec 10 mg each bedtime

## 2012-04-28 NOTE — Patient Instructions (Signed)
Decrease the atenolol to 12.5 mg daily  Zyrtec 10 mg plain one daily at bedtime for allergic symptoms

## 2012-05-18 ENCOUNTER — Telehealth: Payer: Self-pay | Admitting: Family Medicine

## 2012-05-18 DIAGNOSIS — J302 Other seasonal allergic rhinitis: Secondary | ICD-10-CM

## 2012-05-18 NOTE — Telephone Encounter (Signed)
Pt tried calling Dr Colonel Bald as suggested by Dr Tawanna Cooler re: Allergies, but was told that he would need to get a referral in order to be seen there, due to Medicare. Pls do referral. Pt req call back from nurse today, because he wants this referral done asap.

## 2012-05-18 NOTE — Telephone Encounter (Signed)
Referral request sent 

## 2012-05-21 DIAGNOSIS — K219 Gastro-esophageal reflux disease without esophagitis: Secondary | ICD-10-CM | POA: Diagnosis not present

## 2012-05-21 DIAGNOSIS — J3081 Allergic rhinitis due to animal (cat) (dog) hair and dander: Secondary | ICD-10-CM | POA: Diagnosis not present

## 2012-05-21 DIAGNOSIS — H81319 Aural vertigo, unspecified ear: Secondary | ICD-10-CM | POA: Diagnosis not present

## 2012-05-27 ENCOUNTER — Other Ambulatory Visit: Payer: Self-pay | Admitting: Orthopedic Surgery

## 2012-05-27 DIAGNOSIS — M6530 Trigger finger, unspecified finger: Secondary | ICD-10-CM | POA: Diagnosis not present

## 2012-06-03 ENCOUNTER — Encounter (HOSPITAL_BASED_OUTPATIENT_CLINIC_OR_DEPARTMENT_OTHER): Payer: Self-pay | Admitting: *Deleted

## 2012-06-03 NOTE — Progress Notes (Signed)
To come in for bmet-[id number]  

## 2012-06-04 ENCOUNTER — Encounter (HOSPITAL_BASED_OUTPATIENT_CLINIC_OR_DEPARTMENT_OTHER)
Admission: RE | Admit: 2012-06-04 | Discharge: 2012-06-04 | Disposition: A | Discharge status: Home / Self Care | Payer: Medicare Other | Source: Ambulatory Visit | Attending: Orthopedic Surgery | Admitting: Orthopedic Surgery

## 2012-06-04 DIAGNOSIS — M65839 Other synovitis and tenosynovitis, unspecified forearm: Secondary | ICD-10-CM | POA: Diagnosis not present

## 2012-06-04 DIAGNOSIS — Z01810 Encounter for preprocedural cardiovascular examination: Secondary | ICD-10-CM | POA: Diagnosis not present

## 2012-06-04 DIAGNOSIS — Z01812 Encounter for preprocedural laboratory examination: Secondary | ICD-10-CM | POA: Diagnosis not present

## 2012-06-04 DIAGNOSIS — M6530 Trigger finger, unspecified finger: Secondary | ICD-10-CM | POA: Diagnosis not present

## 2012-06-04 DIAGNOSIS — M65849 Other synovitis and tenosynovitis, unspecified hand: Secondary | ICD-10-CM | POA: Diagnosis not present

## 2012-06-04 DIAGNOSIS — I10 Essential (primary) hypertension: Secondary | ICD-10-CM | POA: Diagnosis not present

## 2012-06-04 LAB — BASIC METABOLIC PANEL
BUN: 15 mg/dL (ref 6–23)
CO2: 27 mEq/L (ref 19–32)
Calcium: 9.2 mg/dL (ref 8.4–10.5)
Chloride: 104 mEq/L (ref 96–112)
Creatinine, Ser: 1.44 mg/dL — ABNORMAL HIGH (ref 0.50–1.35)
GFR calc Af Amer: 53 mL/min — ABNORMAL LOW (ref >90)
GFR calc non Af Amer: 46 mL/min — ABNORMAL LOW (ref >90)
Glucose, Bld: 79 mg/dL (ref 70–99)
Potassium: 4.7 mEq/L (ref 3.5–5.1)
Sodium: 139 mEq/L (ref 135–145)

## 2012-06-08 ENCOUNTER — Encounter (HOSPITAL_BASED_OUTPATIENT_CLINIC_OR_DEPARTMENT_OTHER): Payer: Self-pay | Admitting: Anesthesiology

## 2012-06-08 ENCOUNTER — Ambulatory Visit (HOSPITAL_BASED_OUTPATIENT_CLINIC_OR_DEPARTMENT_OTHER): Payer: Medicare Other | Admitting: Anesthesiology

## 2012-06-08 ENCOUNTER — Ambulatory Visit (HOSPITAL_BASED_OUTPATIENT_CLINIC_OR_DEPARTMENT_OTHER)
Admission: RE | Admit: 2012-06-08 | Discharge: 2012-06-08 | Disposition: A | Discharge status: Home / Self Care | Payer: Medicare Other | Source: Ambulatory Visit | Attending: Orthopedic Surgery | Admitting: Orthopedic Surgery

## 2012-06-08 ENCOUNTER — Encounter (HOSPITAL_BASED_OUTPATIENT_CLINIC_OR_DEPARTMENT_OTHER): Payer: Self-pay | Admitting: Orthopedic Surgery

## 2012-06-08 ENCOUNTER — Encounter (HOSPITAL_BASED_OUTPATIENT_CLINIC_OR_DEPARTMENT_OTHER)
Admission: RE | Disposition: A | Discharge status: Home / Self Care | Payer: Self-pay | Source: Ambulatory Visit | Attending: Orthopedic Surgery

## 2012-06-08 DIAGNOSIS — Z01810 Encounter for preprocedural cardiovascular examination: Secondary | ICD-10-CM | POA: Insufficient documentation

## 2012-06-08 DIAGNOSIS — Z01812 Encounter for preprocedural laboratory examination: Secondary | ICD-10-CM | POA: Insufficient documentation

## 2012-06-08 DIAGNOSIS — M65849 Other synovitis and tenosynovitis, unspecified hand: Secondary | ICD-10-CM | POA: Diagnosis not present

## 2012-06-08 DIAGNOSIS — M65839 Other synovitis and tenosynovitis, unspecified forearm: Secondary | ICD-10-CM | POA: Diagnosis not present

## 2012-06-08 DIAGNOSIS — M6530 Trigger finger, unspecified finger: Secondary | ICD-10-CM | POA: Diagnosis not present

## 2012-06-08 DIAGNOSIS — I10 Essential (primary) hypertension: Secondary | ICD-10-CM | POA: Insufficient documentation

## 2012-06-08 HISTORY — DX: Unspecified hearing loss, unspecified ear: H91.90

## 2012-06-08 HISTORY — DX: Unspecified osteoarthritis, unspecified site: M19.90

## 2012-06-08 HISTORY — DX: Other seasonal allergic rhinitis: J30.2

## 2012-06-08 HISTORY — PX: TRIGGER FINGER RELEASE: SHX641

## 2012-06-08 HISTORY — DX: Hyperlipidemia, unspecified: E78.5

## 2012-06-08 HISTORY — DX: Gastro-esophageal reflux disease without esophagitis: K21.9

## 2012-06-08 HISTORY — DX: Presence of external hearing-aid: Z97.4

## 2012-06-08 HISTORY — DX: Presence of spectacles and contact lenses: Z97.3

## 2012-06-08 LAB — POCT HEMOGLOBIN-HEMACUE: Hemoglobin: 13.5 g/dL (ref 13.0–17.0)

## 2012-06-08 SURGERY — RELEASE, A1 PULLEY, FOR TRIGGER FINGER
Anesthesia: Regional | Site: Finger | Laterality: Left | Wound class: Clean

## 2012-06-08 MED ORDER — CHLORHEXIDINE GLUCONATE 4 % EX LIQD: 60.0000 mL | Freq: Once | CUTANEOUS | Status: DC

## 2012-06-08 MED ORDER — BUPIVACAINE HCL (PF) 0.25 % IJ SOLN
INTRAMUSCULAR | Status: DC | PRN
  Administered 2012-06-08: 3 mL

## 2012-06-08 MED ORDER — PROPOFOL 10 MG/ML IV EMUL
INTRAVENOUS | Status: DC | PRN
  Administered 2012-06-08: 75 ug/kg/min via INTRAVENOUS

## 2012-06-08 MED ORDER — CEFAZOLIN SODIUM-DEXTROSE 2-3 GM-% IV SOLR: 2.0000 g | INTRAVENOUS | Status: DC

## 2012-06-08 MED ORDER — OXYCODONE HCL 5 MG/5ML PO SOLN: 5.0000 mg | Freq: Once | ORAL | Status: DC | PRN

## 2012-06-08 MED ORDER — FENTANYL CITRATE 0.05 MG/ML IJ SOLN
INTRAMUSCULAR | Status: DC | PRN
  Administered 2012-06-08: 50 ug via INTRAVENOUS
  Administered 2012-06-08 (×2): 25 ug via INTRAVENOUS

## 2012-06-08 MED ORDER — HYDROCODONE-ACETAMINOPHEN 5-500 MG PO TABS: 1.0000 | ORAL_TABLET | ORAL | Status: AC | PRN

## 2012-06-08 MED ORDER — LIDOCAINE HCL (PF) 0.5 % IJ SOLN
INTRAMUSCULAR | Status: DC | PRN
  Administered 2012-06-08: 35 mL via INTRAVENOUS

## 2012-06-08 MED ORDER — ONDANSETRON HCL 4 MG/2ML IJ SOLN
INTRAMUSCULAR | Status: DC | PRN
  Administered 2012-06-08: 4 mg via INTRAVENOUS

## 2012-06-08 MED ORDER — LACTATED RINGERS IV SOLN
INTRAVENOUS | Status: DC
  Administered 2012-06-08: 10:00:00 via INTRAVENOUS

## 2012-06-08 MED ORDER — ONDANSETRON HCL 4 MG/2ML IJ SOLN: 4.0000 mg | Freq: Four times a day (QID) | INTRAMUSCULAR | Status: DC | PRN

## 2012-06-08 MED ORDER — FENTANYL CITRATE 0.05 MG/ML IJ SOLN: 25.0000 ug | INTRAMUSCULAR | Status: DC | PRN

## 2012-06-08 MED ORDER — OXYCODONE HCL 5 MG PO TABS: 5.0000 mg | ORAL_TABLET | Freq: Once | ORAL | Status: DC | PRN

## 2012-06-08 SURGICAL SUPPLY — 37 items
BANDAGE COBAN STERILE 2 (GAUZE/BANDAGES/DRESSINGS) ×2 IMPLANT
BLADE SURG 15 STRL LF DISP TIS (BLADE) ×1 IMPLANT
BLADE SURG 15 STRL SS (BLADE) ×2
BNDG CMPR 9X4 STRL LF SNTH (GAUZE/BANDAGES/DRESSINGS)
BNDG ESMARK 4X9 LF (GAUZE/BANDAGES/DRESSINGS) IMPLANT
CHLORAPREP W/TINT 26ML (MISCELLANEOUS) ×2 IMPLANT
CLOTH BEACON ORANGE TIMEOUT ST (SAFETY) ×2 IMPLANT
CORDS BIPOLAR (ELECTRODE) ×1 IMPLANT
COVER MAYO STAND STRL (DRAPES) ×2 IMPLANT
COVER TABLE BACK 60X90 (DRAPES) ×2 IMPLANT
CUFF TOURNIQUET SINGLE 18IN (TOURNIQUET CUFF) ×1 IMPLANT
DECANTER SPIKE VIAL GLASS SM (MISCELLANEOUS) IMPLANT
DRAPE EXTREMITY T 121X128X90 (DRAPE) ×2 IMPLANT
DRAPE SURG 17X23 STRL (DRAPES) ×2 IMPLANT
GAUZE XEROFORM 1X8 LF (GAUZE/BANDAGES/DRESSINGS) ×2 IMPLANT
GLOVE BIO SURGEON STRL SZ 6.5 (GLOVE) ×2 IMPLANT
GLOVE BIO SURGEON STRL SZ7.5 (GLOVE) ×2 IMPLANT
GLOVE BIOGEL PI IND STRL 8 (GLOVE) ×1 IMPLANT
GLOVE BIOGEL PI IND STRL 8.5 (GLOVE) IMPLANT
GLOVE BIOGEL PI INDICATOR 8 (GLOVE) ×3
GLOVE BIOGEL PI INDICATOR 8.5 (GLOVE) ×1
GLOVE SURG ORTHO 8.0 STRL STRW (GLOVE) ×2 IMPLANT
GOWN BRE IMP PREV XXLGXLNG (GOWN DISPOSABLE) ×4 IMPLANT
GOWN PREVENTION PLUS XLARGE (GOWN DISPOSABLE) ×2 IMPLANT
NEEDLE 27GAX1X1/2 (NEEDLE) ×1 IMPLANT
NS IRRIG 1000ML POUR BTL (IV SOLUTION) ×2 IMPLANT
PACK BASIN DAY SURGERY FS (CUSTOM PROCEDURE TRAY) ×2 IMPLANT
PADDING CAST ABS 4INX4YD NS (CAST SUPPLIES)
PADDING CAST ABS COTTON 4X4 ST (CAST SUPPLIES) ×1 IMPLANT
SPONGE GAUZE 4X4 12PLY (GAUZE/BANDAGES/DRESSINGS) ×2 IMPLANT
STOCKINETTE 4X48 STRL (DRAPES) ×2 IMPLANT
SUT VICRYL RAPIDE 4/0 PS 2 (SUTURE) ×2 IMPLANT
SYR BULB 3OZ (MISCELLANEOUS) ×2 IMPLANT
SYR CONTROL 10ML LL (SYRINGE) ×1 IMPLANT
TOWEL OR 17X24 6PK STRL BLUE (TOWEL DISPOSABLE) ×4 IMPLANT
UNDERPAD 30X30 INCONTINENT (UNDERPADS AND DIAPERS) ×2 IMPLANT
WATER STERILE IRR 1000ML POUR (IV SOLUTION) ×1 IMPLANT

## 2012-06-08 NOTE — Op Note (Signed)
Dictation Number [id number]

## 2012-06-08 NOTE — H&P (Signed)
Jason Farmer is a 77 year old right hand dominant male who comes in complaining of catching of his left middle finger and to a lesser extent right middle finger. This has been doing on for 3 months. He has been wearing a splint. He has a decrease grip in his left hand. He has no history of injury. He has seen Dr. Venetia Maxon in the past and was told he had carpal tunnel syndrome on the right side but today is primarily his left side bothering him. He complains of intermittent, moderate aching pain with weakness. He states it is getting worse. It does awaken him at night. He has been wearing a brace on his right side. No history of diabetes, thyroid problems, arthritis or gout. He states he has had nerve conductions done approximately 4 months ago.   Past Medical History: He states he is allergic to Cortisone. He is on Atenolol, Omeprazole, Crestor, and Lorazepam. He has had neck, appendectomy, hemorrhoid surgery and tonsillectomy.    Family Medical History: Negative.  Social History: He does not smoke or drink. He is married and retired.  Review of Systems: Positive for glass, hearing loss, ringing in his ears, otherwise negative for 14 points and occasional easy bruising. Jason Farmer is an 77 y.o. male.   Chief Complaint: STS LMF HPI: see above  Past Medical History  Diagnosis Date  . Hypertension   . GERD (gastroesophageal reflux disease)   . Seasonal allergies   . Arthritis   . Wears glasses   . Hyperlipemia   . HOH (hard of hearing)   . Wears hearing aid     Past Surgical History  Procedure Date  . Eye surgery     both cataracts  . Cervical fusion 2007  . Knee arthroscopy     left  . Appendectomy   . Tonsillectomy   . Hemorrhoid surgery   . Colonoscopy   . Upper gastrointestinal endoscopy     No family history on file. Social History:  reports that he quit smoking about 28 years ago. He quit smokeless tobacco use about 25 years ago. He reports that he does not drink  alcohol or use illicit drugs.  Allergies: No Known Allergies  Medications Prior to Admission  Medication Sig Dispense Refill  . ALPRAZolam (XANAX) 0.5 MG tablet Take 1 tablet (0.5 mg total) by mouth at bedtime as needed.  60 tablet  1  . atenolol (TENORMIN) 12.5 mg TABS Take 0.5 tablets (12.5 mg total) by mouth daily.  100 tablet  3  . omeprazole (PRILOSEC) 40 MG capsule Take 1 capsule (40 mg total) by mouth daily.  100 capsule  3  . rosuvastatin (CRESTOR) 10 MG tablet Take 1 tablet (10 mg total) by mouth daily.  100 tablet  3    No results found for this or any previous visit (from the past 48 hour(s)).  No results found.   Pertinent items are noted in HPI.  Blood pressure 150/86, pulse 72, temperature 97.4 F (36.3 C), temperature source Oral, resp. rate 20, height 5' 7.5" (1.715 m), weight 74.844 kg (165 lb), SpO2 99.00%.  General appearance: alert, cooperative and appears stated age Head: Normocephalic, without obvious abnormality Neck: no adenopathy Resp: clear to auscultation bilaterally Cardio: regular rate and rhythm, S1, S2 normal, no murmur, click, rub or gallop GI: soft, non-tender; bowel sounds normal; no masses,  no organomegaly Extremities: extremities normal, atraumatic, no cyanosis or edema Pulses: 2+ and symmetric Skin: Skin color, texture, turgor normal.  No rashes or lesions Neurologic: Grossly normal Incision/Wound: na  Assessment/Plan We have discussed the possibility of surgical decompression to the left middle. He would like to proceed to have that done.  The pre, peri and postoperative course were discussed along with the risks and complications.  The patient is aware there is no guarantee with the surgery, possibility of infection, recurrence, injury to arteries, nerves, tendons, incomplete relief of symptoms and dystrophy.  He would like to proceed.  He is scheduled for left middle finger trigger release as an outpatient.   Allysia Ingles R 06/08/2012,  11:19 AM

## 2012-06-08 NOTE — Anesthesia Postprocedure Evaluation (Signed)
Anesthesia Post Note  Patient: Jason Farmer  Procedure(s) Performed: Procedure(s) (LRB): RELEASE TRIGGER FINGER/A-1 PULLEY (Left)  Anesthesia type: MAC  Patient location: PACU  Post pain: Pain level controlled and Adequate analgesia  Post assessment: Post-op Vital signs reviewed, Patient's Cardiovascular Status Stable and Respiratory Function Stable  Last Vitals:  Filed Vitals:   06/08/12 1230  BP: 131/64  Pulse: 62  Temp:   Resp: 18    Post vital signs: Reviewed and stable  Level of consciousness: awake, alert  and oriented  Complications: No apparent anesthesia complications

## 2012-06-08 NOTE — Brief Op Note (Signed)
06/08/2012  12:06 PM  PATIENT:  [name]  77 y.o. male  PRE-OPERATIVE DIAGNOSIS:  STENOSING TENOSYNOVITIS LEFT MIDDLE FINGER  POST-OPERATIVE DIAGNOSIS:  STENOSING TENOSYNOVITIS LEFT MIDDLE FINGER  PROCEDURE:  Procedure(s) (LRB) with comments: RELEASE TRIGGER FINGER/A-1 PULLEY (Left) - Release A-1 Pulley Left Long Finger  SURGEON:  Surgeon(s) and Role:    * Nicki Reaper, MD - Primary    * Tami Ribas, MD - Assisting  PHYSICIAN ASSISTANT:   ASSISTANTS: K Salah Nakamura,MD   ANESTHESIA:   local and regional  EBL:  Total I/O In: -  Out: 450 [Urine:450]  BLOOD ADMINISTERED:none  DRAINS: none   LOCAL MEDICATIONS USED:  MARCAINE     SPECIMEN:  No Specimen  DISPOSITION OF SPECIMEN:  N/A  COUNTS:  YES  TOURNIQUET:   Total Tourniquet Time Documented: Forearm (Left) - 18 minutes  DICTATION: .Other Dictation: Dictation Number [id number]  PLAN OF CARE: Discharge to home after PACU  PATIENT DISPOSITION:  PACU - hemodynamically stable.

## 2012-06-08 NOTE — Anesthesia Preprocedure Evaluation (Addendum)
Anesthesia Evaluation  Patient identified by MRN, date of birth, ID band Patient awake    Reviewed: Allergy & Precautions, H&P , NPO status , Patient's Chart, lab work & pertinent test results  Airway Mallampati: II  Neck ROM: full    Dental   Pulmonary former smoker,          Cardiovascular hypertension,     Neuro/Psych    GI/Hepatic GERD-  ,  Endo/Other    Renal/GU      Musculoskeletal  (+) Arthritis -,   Abdominal   Peds  Hematology   Anesthesia Other Findings   Reproductive/Obstetrics                           Anesthesia Physical Anesthesia Plan  ASA: II  Anesthesia Plan: MAC and Bier Block   Post-op Pain Management:    Induction: Intravenous  Airway Management Planned: Simple Face Mask  Additional Equipment:   Intra-op Plan:   Post-operative Plan:   Informed Consent: I have reviewed the patients History and Physical, chart, labs and discussed the procedure including the risks, benefits and alternatives for the proposed anesthesia with the patient or authorized representative who has indicated his/her understanding and acceptance.     Plan Discussed with: CRNA and Surgeon  Anesthesia Plan Comments:         Anesthesia Quick Evaluation

## 2012-06-08 NOTE — Transfer of Care (Signed)
Immediate Anesthesia Transfer of Care Note  Patient: Jason Farmer  Procedure(s) Performed: Procedure(s) (LRB) with comments: RELEASE TRIGGER FINGER/A-1 PULLEY (Left) - Release A-1 Pulley Left Long Finger  Patient Location: PACU  Anesthesia Type: Bier block  Level of Consciousness: awake, oriented and patient cooperative  Airway & Oxygen Therapy: Patient Spontanous Breathing and Patient connected to face mask oxygen  Post-op Assessment: Report given to PACU RN and Post -op Vital signs reviewed and stable  Post vital signs: Reviewed and stable  Complications: No apparent anesthesia complications

## 2012-06-09 NOTE — Op Note (Signed)
NAME:  DEVAL, MROCZKA NO.:  [id number]  MEDICAL RECORD NO.:  [id number]  LOCATION:                                 FACILITY:  PHYSICIAN:  Cindee Salt, M.D.            DATE OF BIRTH:  DATE OF PROCEDURE:  06/08/2012 DATE OF DISCHARGE:                              OPERATIVE REPORT   PREOPERATIVE DIAGNOSIS:  Stenosing tenosynovitis, left middle finger.  POSTOPERATIVE DIAGNOSIS:  Stenosing tenosynovitis, left middle finger.  OPERATION:  Release of A1 pulley, excision of flexor sheath cyst, left middle finger.  SURGEON:  Cindee Salt, M.D.  ASSISTANT:  Betha Loa, MD  ANESTHESIA:  Forearm-based IV regional with local infiltration.  ANESTHESIOLOGIST:  Achille Rich, MD.  HISTORY:  The patient is a 77 year old male with a history of triggering, locking of his left middle finger, not responsive to conservative treatment.  He has elected to undergo surgical release. Pre, peri, and postoperative course have been discussed along with risks and complications.  He is aware that there is no guarantee with surgery; possibility of infection; recurrence of injury to arteries, nerves, tendons, incomplete relief of symptoms, dystrophy.  In the preoperative area, the patient is seen, the extremity marked by both the patient and surgeon.  Antibiotic given.  PROCEDURE:  The patient was brought to the operating room where a forearm-based IV regional anesthetic was carried out without difficulty. He was prepped using ChloraPrep, supine position, left arm free.  A 3- minute dry time was allowed.  Time-out taken, confirming the patient and procedure.  An oblique incision was made over the A1 pulley in the left middle finger, carried down through subcutaneous tissue.  The flexor sheath was identified, retractors placed protecting neurovascular bundles radially and ulnarly.  A small cyst was immediately apparent. This was excised.  Release of the A1 pulley was then performed on  its radial aspect.  Small incision was made centrally in A2.  Partial synovectomy performed proximally.  Finger placed through a full range motion, no further triggering was noted.  The wound was irrigated.  The skin closed with interrupted 4-0 Vicryl Rapide sutures.  Local infiltration with 0.25% Marcaine without epinephrine was given, 3 mL was used.  Sterile compressive dressing to the hand with the fingers free was applied.  On deflation of the tourniquet, all fingers immediately pinked.  He was taken to the recovery room for observation in satisfactory condition.  He will be discharged home to return to the [facility] [place] in 1 week  on Vicodin.          ______________________________ Cindee Salt, M.D.     GK/MEDQ  D:  06/08/2012  T:  06/09/2012  Job:  [id number]

## 2012-06-10 ENCOUNTER — Encounter (HOSPITAL_BASED_OUTPATIENT_CLINIC_OR_DEPARTMENT_OTHER): Payer: Self-pay | Admitting: Orthopedic Surgery

## 2012-06-30 ENCOUNTER — Other Ambulatory Visit: Payer: Self-pay | Admitting: Orthopedic Surgery

## 2012-07-12 NOTE — Progress Notes (Signed)
Was here 10/13 with a TF-doing well-needs another one done-to come in for bmet

## 2012-07-13 ENCOUNTER — Encounter (HOSPITAL_BASED_OUTPATIENT_CLINIC_OR_DEPARTMENT_OTHER)
Admission: RE | Admit: 2012-07-13 | Discharge: 2012-07-13 | Disposition: A | Discharge status: Home / Self Care | Payer: Medicare Other | Source: Ambulatory Visit | Attending: Orthopedic Surgery | Admitting: Orthopedic Surgery

## 2012-07-13 DIAGNOSIS — I10 Essential (primary) hypertension: Secondary | ICD-10-CM | POA: Diagnosis not present

## 2012-07-13 DIAGNOSIS — E785 Hyperlipidemia, unspecified: Secondary | ICD-10-CM | POA: Diagnosis not present

## 2012-07-13 DIAGNOSIS — Z79899 Other long term (current) drug therapy: Secondary | ICD-10-CM | POA: Diagnosis not present

## 2012-07-13 DIAGNOSIS — Z01812 Encounter for preprocedural laboratory examination: Secondary | ICD-10-CM | POA: Diagnosis not present

## 2012-07-13 DIAGNOSIS — K219 Gastro-esophageal reflux disease without esophagitis: Secondary | ICD-10-CM | POA: Diagnosis not present

## 2012-07-13 DIAGNOSIS — M6530 Trigger finger, unspecified finger: Secondary | ICD-10-CM | POA: Diagnosis not present

## 2012-07-13 LAB — BASIC METABOLIC PANEL
BUN: 15 mg/dL (ref 6–23)
CO2: 28 mEq/L (ref 19–32)
Calcium: 9.2 mg/dL (ref 8.4–10.5)
Chloride: 105 mEq/L (ref 96–112)
Creatinine, Ser: 1.2 mg/dL (ref 0.50–1.35)
GFR calc Af Amer: 66 mL/min — ABNORMAL LOW (ref >90)
GFR calc non Af Amer: 57 mL/min — ABNORMAL LOW (ref >90)
Glucose, Bld: 97 mg/dL (ref 70–99)
Potassium: 4.6 mEq/L (ref 3.5–5.1)
Sodium: 139 mEq/L (ref 135–145)

## 2012-07-14 ENCOUNTER — Encounter (HOSPITAL_BASED_OUTPATIENT_CLINIC_OR_DEPARTMENT_OTHER): Payer: Self-pay | Admitting: *Deleted

## 2012-07-14 ENCOUNTER — Ambulatory Visit (HOSPITAL_BASED_OUTPATIENT_CLINIC_OR_DEPARTMENT_OTHER): Payer: Medicare Other | Admitting: *Deleted

## 2012-07-14 ENCOUNTER — Ambulatory Visit (HOSPITAL_BASED_OUTPATIENT_CLINIC_OR_DEPARTMENT_OTHER)
Admission: RE | Admit: 2012-07-14 | Discharge: 2012-07-14 | Disposition: A | Discharge status: Home / Self Care | Payer: Medicare Other | Source: Ambulatory Visit | Attending: Orthopedic Surgery | Admitting: Orthopedic Surgery

## 2012-07-14 ENCOUNTER — Encounter (HOSPITAL_BASED_OUTPATIENT_CLINIC_OR_DEPARTMENT_OTHER)
Admission: RE | Disposition: A | Discharge status: Home / Self Care | Payer: Self-pay | Source: Ambulatory Visit | Attending: Orthopedic Surgery

## 2012-07-14 DIAGNOSIS — E785 Hyperlipidemia, unspecified: Secondary | ICD-10-CM | POA: Diagnosis not present

## 2012-07-14 DIAGNOSIS — I10 Essential (primary) hypertension: Secondary | ICD-10-CM | POA: Diagnosis not present

## 2012-07-14 DIAGNOSIS — M6530 Trigger finger, unspecified finger: Secondary | ICD-10-CM | POA: Insufficient documentation

## 2012-07-14 DIAGNOSIS — Z01812 Encounter for preprocedural laboratory examination: Secondary | ICD-10-CM | POA: Insufficient documentation

## 2012-07-14 DIAGNOSIS — K219 Gastro-esophageal reflux disease without esophagitis: Secondary | ICD-10-CM | POA: Insufficient documentation

## 2012-07-14 DIAGNOSIS — Z79899 Other long term (current) drug therapy: Secondary | ICD-10-CM | POA: Insufficient documentation

## 2012-07-14 HISTORY — PX: TRIGGER FINGER RELEASE: SHX641

## 2012-07-14 LAB — POCT HEMOGLOBIN-HEMACUE: Hemoglobin: 15.7 g/dL (ref 13.0–17.0)

## 2012-07-14 SURGERY — RELEASE, A1 PULLEY, FOR TRIGGER FINGER
Anesthesia: Regional | Site: Finger | Laterality: Right | Wound class: Clean

## 2012-07-14 MED ORDER — ONDANSETRON HCL 4 MG/2ML IJ SOLN
INTRAMUSCULAR | Status: DC | PRN
  Administered 2012-07-14: 4 mg via INTRAVENOUS

## 2012-07-14 MED ORDER — LIDOCAINE HCL (PF) 0.5 % IJ SOLN
INTRAMUSCULAR | Status: DC | PRN
  Administered 2012-07-14: 30 mL via INTRAVENOUS

## 2012-07-14 MED ORDER — PROPOFOL 10 MG/ML IV EMUL
INTRAVENOUS | Status: DC | PRN
  Administered 2012-07-14: 50 ug/kg/min via INTRAVENOUS

## 2012-07-14 MED ORDER — CEFAZOLIN SODIUM-DEXTROSE 2-3 GM-% IV SOLR
2.0000 g | INTRAVENOUS | Status: AC
  Administered 2012-07-14: 2 g via INTRAVENOUS

## 2012-07-14 MED ORDER — BUPIVACAINE HCL (PF) 0.25 % IJ SOLN
INTRAMUSCULAR | Status: DC | PRN
  Administered 2012-07-14: 5 mL

## 2012-07-14 MED ORDER — CHLORHEXIDINE GLUCONATE 4 % EX LIQD: 60.0000 mL | Freq: Once | CUTANEOUS | Status: DC

## 2012-07-14 MED ORDER — FENTANYL CITRATE 0.05 MG/ML IJ SOLN
INTRAMUSCULAR | Status: DC | PRN
  Administered 2012-07-14: 50 ug via INTRAVENOUS

## 2012-07-14 MED ORDER — LACTATED RINGERS IV SOLN
INTRAVENOUS | Status: DC
  Administered 2012-07-14: 09:00:00 via INTRAVENOUS

## 2012-07-14 MED ORDER — LIDOCAINE HCL (CARDIAC) 20 MG/ML IV SOLN
INTRAVENOUS | Status: DC | PRN
  Administered 2012-07-14: 20 mg via INTRAVENOUS

## 2012-07-14 SURGICAL SUPPLY — 35 items
BANDAGE COBAN STERILE 2 (GAUZE/BANDAGES/DRESSINGS) ×2 IMPLANT
BLADE SURG 15 STRL LF DISP TIS (BLADE) ×1 IMPLANT
BLADE SURG 15 STRL SS (BLADE) ×2
BNDG CMPR 9X4 STRL LF SNTH (GAUZE/BANDAGES/DRESSINGS)
BNDG ESMARK 4X9 LF (GAUZE/BANDAGES/DRESSINGS) IMPLANT
CHLORAPREP W/TINT 26ML (MISCELLANEOUS) ×2 IMPLANT
CLOTH BEACON ORANGE TIMEOUT ST (SAFETY) ×2 IMPLANT
CORDS BIPOLAR (ELECTRODE) IMPLANT
COVER MAYO STAND STRL (DRAPES) ×2 IMPLANT
COVER TABLE BACK 60X90 (DRAPES) ×2 IMPLANT
CUFF TOURNIQUET SINGLE 18IN (TOURNIQUET CUFF) ×1 IMPLANT
DECANTER SPIKE VIAL GLASS SM (MISCELLANEOUS) IMPLANT
DRAPE EXTREMITY T 121X128X90 (DRAPE) ×2 IMPLANT
DRAPE SURG 17X23 STRL (DRAPES) ×2 IMPLANT
GAUZE XEROFORM 1X8 LF (GAUZE/BANDAGES/DRESSINGS) ×2 IMPLANT
GLOVE BIO SURGEON STRL SZ 6.5 (GLOVE) ×2 IMPLANT
GLOVE BIOGEL PI IND STRL 8.5 (GLOVE) ×1 IMPLANT
GLOVE BIOGEL PI INDICATOR 8.5 (GLOVE) ×1
GLOVE INDICATOR 7.0 STRL GRN (GLOVE) ×1 IMPLANT
GLOVE SURG ORTHO 8.0 STRL STRW (GLOVE) ×2 IMPLANT
GOWN BRE IMP PREV XXLGXLNG (GOWN DISPOSABLE) ×2 IMPLANT
GOWN PREVENTION PLUS XLARGE (GOWN DISPOSABLE) ×2 IMPLANT
NEEDLE 27GAX1X1/2 (NEEDLE) ×1 IMPLANT
NS IRRIG 1000ML POUR BTL (IV SOLUTION) ×2 IMPLANT
PACK BASIN DAY SURGERY FS (CUSTOM PROCEDURE TRAY) ×2 IMPLANT
PADDING CAST ABS 4INX4YD NS (CAST SUPPLIES)
PADDING CAST ABS COTTON 4X4 ST (CAST SUPPLIES) ×1 IMPLANT
SPONGE GAUZE 4X4 12PLY (GAUZE/BANDAGES/DRESSINGS) ×2 IMPLANT
STOCKINETTE 4X48 STRL (DRAPES) ×2 IMPLANT
SUT VICRYL RAPIDE 4/0 PS 2 (SUTURE) ×2 IMPLANT
SYR BULB 3OZ (MISCELLANEOUS) ×2 IMPLANT
SYR CONTROL 10ML LL (SYRINGE) ×1 IMPLANT
TOWEL OR 17X24 6PK STRL BLUE (TOWEL DISPOSABLE) ×3 IMPLANT
UNDERPAD 30X30 INCONTINENT (UNDERPADS AND DIAPERS) ×2 IMPLANT
WATER STERILE IRR 1000ML POUR (IV SOLUTION) ×2 IMPLANT

## 2012-07-14 NOTE — Anesthesia Preprocedure Evaluation (Addendum)
Anesthesia Evaluation  Patient identified by MRN, date of birth, ID band Patient awake    Reviewed: Allergy & Precautions, H&P , NPO status , Patient's Chart, lab work & pertinent test results, reviewed documented beta blocker date and time   History of Anesthesia Complications Negative for: history of anesthetic complications  Airway Mallampati: I TM Distance: >3 FB Neck ROM: Full    Dental No notable dental hx. (+) Teeth Intact, Dental Advisory Given and Caps   Pulmonary neg pulmonary ROS, former smoker,  breath sounds clear to auscultation  Pulmonary exam normal       Cardiovascular hypertension, Pt. on medications and Pt. on home beta blockers Rhythm:Regular Rate:Normal     Neuro/Psych negative neurological ROS     GI/Hepatic Neg liver ROS, GERD-  Medicated and Controlled,  Endo/Other  negative endocrine ROS  Renal/GU negative Renal ROS     Musculoskeletal   Abdominal   Peds  Hematology negative hematology ROS (+)   Anesthesia Other Findings   Reproductive/Obstetrics                           Anesthesia Physical Anesthesia Plan  ASA: II  Anesthesia Plan: Bier Block and MAC   Post-op Pain Management:    Induction:   Airway Management Planned: Simple Face Mask  Additional Equipment:   Intra-op Plan:   Post-operative Plan:   Informed Consent: I have reviewed the patients History and Physical, chart, labs and discussed the procedure including the risks, benefits and alternatives for the proposed anesthesia with the patient or authorized representative who has indicated his/her understanding and acceptance.   Dental advisory given  Plan Discussed with: CRNA and Surgeon  Anesthesia Plan Comments: (Plan routine monitors, IV Regional lidocaine with MAC)        Anesthesia Quick Evaluation

## 2012-07-14 NOTE — Op Note (Signed)
Dictation Number [id number]

## 2012-07-14 NOTE — H&P (Signed)
Jason Farmer is a 77 year old right hand dominant male who comes in complaining of catching of his left middle finger and to a lesser extent right ring finger. This has been doing on for 3 months. He has been wearing a splint. He has a decrease grip in his left hand. He has no history of injury. He has seen Dr. Venetia Maxon in the past and was told he had carpal tunnel syndrome on the right side but today is primarily his left side bothering him. He complains of intermittent, moderate aching pain with weakness. He states it is getting worse. It does awaken him at night. He has been wearing a brace on his right side. No history of diabetes, thyroid problems, arthritis or gout. He states he has had nerve conductions done approximately 4 months ago. He ha shad release LMF desires release of RRF STS.  Past Medical History: He states he is allergic to Cortisone. He is on Atenolol, Omeprazole, Crestor, and Lorazepam. He has had neck, appendectomy, hemorrhoid surgery and tonsillectomy.    Family Medical History: Negative.  Social History: He does not smoke or drink. He is married and retired.  Review of Systems: Positive for glass, hearing loss, ringing in his ears, otherwise negative for 14 points and occasional easy bruising. Jason Farmer is an 77 y.o. male.   Chief Complaint: STS RRF HPI:  See above  Past Medical History  Diagnosis Date  . Hypertension   . GERD (gastroesophageal reflux disease)   . Seasonal allergies   . Arthritis   . Wears glasses   . Hyperlipemia   . HOH (hard of hearing)   . Wears hearing aid     Past Surgical History  Procedure Date  . Eye surgery     both cataracts  . Cervical fusion 2007  . Knee arthroscopy     left  . Appendectomy   . Tonsillectomy   . Hemorrhoid surgery   . Colonoscopy   . Upper gastrointestinal endoscopy   . Trigger finger release 06/08/2012    Procedure: RELEASE TRIGGER FINGER/A-1 PULLEY;  Surgeon: Nicki Reaper, MD;  Location: [facility];  Service: Orthopedics;  Laterality: Left;  Release A-1 Pulley Left Long Finger    History reviewed. No pertinent family history. Social History:  reports that he quit smoking about 28 years ago. He quit smokeless tobacco use about 25 years ago. He reports that he does not drink alcohol or use illicit drugs.  Allergies: No Known Allergies  Medications Prior to Admission  Medication Sig Dispense Refill  . ALPRAZolam (XANAX) 0.5 MG tablet Take 1 tablet (0.5 mg total) by mouth at bedtime as needed.  60 tablet  1  . atenolol (TENORMIN) 12.5 mg TABS Take 0.5 tablets (12.5 mg total) by mouth daily.  100 tablet  3  . omeprazole (PRILOSEC) 40 MG capsule Take 1 capsule (40 mg total) by mouth daily.  100 capsule  3  . rosuvastatin (CRESTOR) 10 MG tablet Take 1 tablet (10 mg total) by mouth daily.  100 tablet  3    Results for orders placed during the hospital encounter of 07/14/12 (from the past 48 hour(s))  BASIC METABOLIC PANEL     Status: Abnormal   Collection Time   07/13/12 11:30 AM      Component Value Range Comment   Sodium 139  135 - 145 mEq/L    Potassium 4.6  3.5 - 5.1 mEq/L    Chloride 105  96 -  112 mEq/L    CO2 28  19 - 32 mEq/L    Glucose, Bld 97  70 - 99 mg/dL    BUN 15  6 - 23 mg/dL    Creatinine, Ser 1.61  0.50 - 1.35 mg/dL    Calcium 9.2  8.4 - 09.6 mg/dL    GFR calc non Af Amer 57 (*) >90 mL/min    GFR calc Af Amer 66 (*) >90 mL/min     No results found.   Pertinent items are noted in HPI.  Blood pressure 143/91, pulse 64, temperature 97.9 F (36.6 C), temperature source Oral, resp. rate 18, height 5' 7.5" (1.715 m), weight 74.208 kg (163 lb 9.6 oz), SpO2 97.00%.  General appearance: alert, cooperative and appears stated age Head: Normocephalic, without obvious abnormality Neck: no adenopathy Resp: clear to auscultation bilaterally Cardio: regular rate and rhythm, S1, S2 normal, no murmur, click, rub or gallop GI: soft, non-tender; bowel sounds  normal; no masses,  no organomegaly Extremities: extremities normal, atraumatic, no cyanosis or edema Pulses: 2+ and symmetric Skin: Skin color, texture, turgor normal. No rashes or lesions Neurologic: Grossly normal Incision/Wound: na  Assessment/Plan STS RRF Plan: release RRF Trigger  Jason Farmer 07/14/2012, 8:34 AM

## 2012-07-14 NOTE — Anesthesia Procedure Notes (Signed)
Procedure Name: MAC Date/Time: 07/14/2012 9:29 AM Performed by: Meyer Russel Pre-anesthesia Checklist: Patient identified, Emergency Drugs available, Suction available and Patient being monitored Patient Re-evaluated:Patient Re-evaluated prior to inductionOxygen Delivery Method: Simple face mask Preoxygenation: Pre-oxygenation with 100% oxygen

## 2012-07-14 NOTE — Op Note (Signed)
NAME:  Jason Farmer, Jason Farmer NO.:  [id number]  MEDICAL RECORD NO.:  [id number]  LOCATION:                                 FACILITY:  PHYSICIAN:  Cindee Salt, M.D.            DATE OF BIRTH:  DATE OF PROCEDURE:  07/14/2012 DATE OF DISCHARGE:                              OPERATIVE REPORT   PREOPERATIVE DIAGNOSIS:  Stenosing tenosynovitis, right ring finger.  POSTOPERATIVE DIAGNOSIS:  Stenosing tenosynovitis, right ring finger.  OPERATION:  Decompression.  A1 pulley release, trigger finger, right ring finger.  SURGEON:  Cindee Salt, M.D.  ASSISTANT:  None.  ANESTHESIA:  Forearm-based IV regional with local infiltration.  ANESTHESIOLOGIST:  Sheldon Silvan, M.D.  HISTORY:  The patient is a 77 year old male with a history of multiple trigger fingers, has undergone release on opposite hand.  He is admitted now for release of the A1 pulley of the right ring finger.  Pre, peri, and postoperative course have been discussed along with risks and complications.  He is aware there is no guarantee with the surgery; possibility of infection; recurrence of injury to arteries, nerves, tendons; incomplete relief of symptoms; dystrophy.  In the preoperative area, the patient is seen, the extremity marked by both the patient and surgeon, and antibiotic given.  PROCEDURE:  The patient was brought to the operating room where forearm- based IV regional anesthetic was carried out without difficulty.  He was prepped using ChloraPrep, supine position, right arm free.  A 3-minute dry time was allowed.  Time-out taken, confirming the patient and procedure.  After adequate anesthesia was afforded, an oblique incision was made over the A1 pulley, right ring finger, carried down through the subcutaneous tissue.  Bleeders were electrocauterized with bipolar.  The A1 pulley was released, retractor was placed protecting both radial and ulnar digital neurovascular bundles.  Release was then  performed on the radial aspect of the A1 pulley.  A small incision was made centrally in A2.  Partial tenosynovectomy was performed approximately.  The finger was allowed to fully flex and extend without further triggering.  The wound was irrigated.  The skin was closed with interrupted 4-0 Vicryl Rapide sutures.  Local infiltration with 0.25% Marcaine without epinephrine was given, 5 mL was used.  A sterile compressive dressing with fingers free was applied. On deflation of the tourniquet, all fingers were immediately pinked.  He was taken to the recovery room for observation in satisfactory condition.  He will be discharged home to return in 1 week.  He has pain medicine at home.          ______________________________ Cindee Salt, M.D.     GK/MEDQ  D:  07/14/2012  T:  07/14/2012  Job:  [id number]

## 2012-07-14 NOTE — Brief Op Note (Signed)
07/14/2012  9:56 AM  PATIENT:  [name]  77 y.o. male  PRE-OPERATIVE DIAGNOSIS:  STS RIGHT RING FINGER  POST-OPERATIVE DIAGNOSIS:  STS RIGHT RING FINGER  PROCEDURE:  Procedure(s) (LRB) with comments: RELEASE TRIGGER FINGER/A-1 PULLEY (Right)  SURGEON:  Surgeon(s) and Role:    * Nicki Reaper, MD - Primary  PHYSICIAN ASSISTANT:   ASSISTANTS: none   ANESTHESIA:   local and regional  EBL:  Total I/O In: 600 [I.V.:600] Out: -   BLOOD ADMINISTERED:none  DRAINS: none   LOCAL MEDICATIONS USED:  MARCAINE     SPECIMEN:  No Specimen  DISPOSITION OF SPECIMEN:  N/A  COUNTS:  YES  TOURNIQUET:   Total Tourniquet Time Documented: Forearm (Right) - 14 minutes  DICTATION: .Other Dictation: Dictation Number [id number]  PLAN OF CARE: Discharge to home after PACU  PATIENT DISPOSITION:  PACU - hemodynamically stable.

## 2012-07-14 NOTE — Anesthesia Postprocedure Evaluation (Signed)
  Anesthesia Post-op Note  Patient: Jason Farmer  Procedure(s) Performed: Procedure(s) (LRB) with comments: RELEASE TRIGGER FINGER/A-1 PULLEY (Right)  Patient Location: PACU  Anesthesia Type:Bier block  Level of Consciousness: awake, alert , oriented and patient cooperative  Airway and Oxygen Therapy: Patient Spontanous Breathing  Post-op Pain: none  Post-op Assessment: Post-op Vital signs reviewed, Patient's Cardiovascular Status Stable, Respiratory Function Stable, Patent Airway, No signs of Nausea or vomiting and Pain level controlled  Post-op Vital Signs: Reviewed and stable  Complications: No apparent anesthesia complications

## 2012-07-14 NOTE — Transfer of Care (Signed)
Immediate Anesthesia Transfer of Care Note  Patient: Jason Farmer  Procedure(s) Performed: Procedure(s) (LRB) with comments: RELEASE TRIGGER FINGER/A-1 PULLEY (Right)  Patient Location: PACU  Anesthesia Type:Bier block  Level of Consciousness: awake, alert  and oriented  Airway & Oxygen Therapy: Patient Spontanous Breathing  Post-op Assessment: Report given to PACU RN, Post -op Vital signs reviewed and stable and Patient moving all extremities  Post vital signs: Reviewed and stable  Complications: No apparent anesthesia complications

## 2012-07-16 ENCOUNTER — Encounter (HOSPITAL_BASED_OUTPATIENT_CLINIC_OR_DEPARTMENT_OTHER): Payer: Self-pay | Admitting: Orthopedic Surgery

## 2012-08-30 ENCOUNTER — Other Ambulatory Visit (INDEPENDENT_AMBULATORY_CARE_PROVIDER_SITE_OTHER): Payer: Medicare Other

## 2012-08-30 DIAGNOSIS — E039 Hypothyroidism, unspecified: Secondary | ICD-10-CM

## 2012-08-30 DIAGNOSIS — Z0000 Encounter for general adult medical examination without abnormal findings: Secondary | ICD-10-CM

## 2012-08-30 DIAGNOSIS — E785 Hyperlipidemia, unspecified: Secondary | ICD-10-CM | POA: Diagnosis not present

## 2012-08-30 DIAGNOSIS — N400 Enlarged prostate without lower urinary tract symptoms: Secondary | ICD-10-CM

## 2012-08-30 DIAGNOSIS — K219 Gastro-esophageal reflux disease without esophagitis: Secondary | ICD-10-CM | POA: Diagnosis not present

## 2012-08-30 DIAGNOSIS — I10 Essential (primary) hypertension: Secondary | ICD-10-CM

## 2012-08-30 DIAGNOSIS — J3081 Allergic rhinitis due to animal (cat) (dog) hair and dander: Secondary | ICD-10-CM | POA: Diagnosis not present

## 2012-08-30 DIAGNOSIS — D649 Anemia, unspecified: Secondary | ICD-10-CM

## 2012-08-30 DIAGNOSIS — H81319 Aural vertigo, unspecified ear: Secondary | ICD-10-CM | POA: Diagnosis not present

## 2012-08-30 LAB — POCT URINALYSIS DIPSTICK
Bilirubin, UA: NEGATIVE
Glucose, UA: NEGATIVE
Ketones, UA: NEGATIVE
Leukocytes, UA: NEGATIVE
Nitrite, UA: NEGATIVE
Spec Grav, UA: 1.02
Urobilinogen, UA: 0.2
pH, UA: 5.5

## 2012-08-30 LAB — CBC WITH DIFFERENTIAL/PLATELET
Basophils Absolute: 0 10*3/uL (ref 0.0–0.1)
Basophils Relative: 0.4 % (ref 0.0–3.0)
Eosinophils Absolute: 0.1 10*3/uL (ref 0.0–0.7)
Eosinophils Relative: 1.5 % (ref 0.0–5.0)
HCT: 44.4 % (ref 39.0–52.0)
Hemoglobin: 15 g/dL (ref 13.0–17.0)
Lymphocytes Relative: 26.9 % (ref 12.0–46.0)
Lymphs Abs: 2 10*3/uL (ref 0.7–4.0)
MCHC: 33.9 g/dL (ref 30.0–36.0)
MCV: 89.8 fl (ref 78.0–100.0)
Monocytes Absolute: 0.6 10*3/uL (ref 0.1–1.0)
Monocytes Relative: 8.5 % (ref 3.0–12.0)
Neutro Abs: 4.7 10*3/uL (ref 1.4–7.7)
Neutrophils Relative %: 62.7 % (ref 43.0–77.0)
Platelets: 264 10*3/uL (ref 150.0–400.0)
RBC: 4.95 Mil/uL (ref 4.22–5.81)
RDW: 13.4 % (ref 11.5–14.6)
WBC: 7.5 10*3/uL (ref 4.5–10.5)

## 2012-08-30 LAB — TSH: TSH: 1.69 u[IU]/mL (ref 0.35–5.50)

## 2012-08-30 LAB — HEPATIC FUNCTION PANEL
ALT: 39 U/L (ref 0–53)
AST: 23 U/L (ref 0–37)
Albumin: 3.8 g/dL (ref 3.5–5.2)
Alkaline Phosphatase: 51 U/L (ref 39–117)
Bilirubin, Direct: 0.1 mg/dL (ref 0.0–0.3)
Total Bilirubin: 1 mg/dL (ref 0.3–1.2)
Total Protein: 6.6 g/dL (ref 6.0–8.3)

## 2012-08-30 LAB — LIPID PANEL
Cholesterol: 208 mg/dL — ABNORMAL HIGH (ref 0–200)
HDL: 48.4 mg/dL (ref >39.00)
Total CHOL/HDL Ratio: 4
Triglycerides: 115 mg/dL (ref 0.0–149.0)
VLDL: 23 mg/dL (ref 0.0–40.0)

## 2012-08-30 LAB — BASIC METABOLIC PANEL
BUN: 20 mg/dL (ref 6–23)
CO2: 28 mEq/L (ref 19–32)
Calcium: 9.2 mg/dL (ref 8.4–10.5)
Chloride: 104 mEq/L (ref 96–112)
Creatinine, Ser: 1.2 mg/dL (ref 0.4–1.5)
GFR: 63.28 mL/min (ref >60.00)
Glucose, Bld: 93 mg/dL (ref 70–99)
Potassium: 4.9 mEq/L (ref 3.5–5.1)
Sodium: 139 mEq/L (ref 135–145)

## 2012-08-30 LAB — LDL CHOLESTEROL, DIRECT: Direct LDL: 151.9 mg/dL

## 2012-08-30 LAB — PSA: PSA: 2.54 ng/mL (ref 0.10–4.00)

## 2012-09-06 ENCOUNTER — Ambulatory Visit (INDEPENDENT_AMBULATORY_CARE_PROVIDER_SITE_OTHER): Payer: Medicare Other | Admitting: Family Medicine

## 2012-09-06 ENCOUNTER — Encounter: Payer: Self-pay | Admitting: Family Medicine

## 2012-09-06 VITALS — Temp 98.0°F | Ht 68.5 in | Wt 174.0 lb

## 2012-09-06 DIAGNOSIS — G252 Other specified forms of tremor: Secondary | ICD-10-CM

## 2012-09-06 DIAGNOSIS — Z23 Encounter for immunization: Secondary | ICD-10-CM | POA: Diagnosis not present

## 2012-09-06 DIAGNOSIS — F411 Generalized anxiety disorder: Secondary | ICD-10-CM

## 2012-09-06 DIAGNOSIS — Z0000 Encounter for general adult medical examination without abnormal findings: Secondary | ICD-10-CM | POA: Diagnosis not present

## 2012-09-06 DIAGNOSIS — G250 Essential tremor: Secondary | ICD-10-CM

## 2012-09-06 DIAGNOSIS — E785 Hyperlipidemia, unspecified: Secondary | ICD-10-CM | POA: Diagnosis not present

## 2012-09-06 DIAGNOSIS — K219 Gastro-esophageal reflux disease without esophagitis: Secondary | ICD-10-CM

## 2012-09-06 MED ORDER — ATENOLOL 12.5 MG HALF TABLET: 12.5000 mg | ORAL_TABLET | Freq: Every day | ORAL | Status: DC

## 2012-09-06 MED ORDER — OMEPRAZOLE 40 MG PO CPDR: 40.0000 mg | DELAYED_RELEASE_CAPSULE | Freq: Every day | ORAL | Status: DC

## 2012-09-06 MED ORDER — ALPRAZOLAM 0.5 MG PO TABS: 0.5000 mg | ORAL_TABLET | Freq: Every evening | ORAL | Status: DC | PRN

## 2012-09-06 MED ORDER — ROSUVASTATIN CALCIUM 10 MG PO TABS: 10.0000 mg | ORAL_TABLET | Freq: Every day | ORAL | Status: DC

## 2012-09-06 NOTE — Progress Notes (Signed)
  Subjective:    Patient ID: Jason Farmer, male    DOB: 01-Apr-1935, 78 y.o.   MRN: [id number]  HPI Drayton is 78 year old male who comes in today for a Medicare wellness examination because of a history of a tremor, reflux esophagitis, hyperlipidemia  His medication reviewed there've been no changes. He continues to see the allergist for allergic rhinitis  He gets routine eye care, dental care, colonoscopy and GI, vaccinations up-to-date,  Cognitive function normal he walks on a regular basis home health safety reviewed no issues identified, no guns in the house, he does have a health care power of attorney and living well   Review of Systems  Constitutional: Negative.   HENT: Negative.   Eyes: Negative.   Respiratory: Negative.   Cardiovascular: Negative.   Gastrointestinal: Negative.   Genitourinary: Negative.   Musculoskeletal: Negative.   Skin: Negative.   Neurological: Negative.   Hematological: Negative.   Psychiatric/Behavioral: Negative.        Objective:   Physical Exam  Constitutional: He is oriented to person, place, and time. He appears well-developed and well-nourished.  HENT:  Head: Normocephalic and atraumatic.  Right Ear: External ear normal.  Left Ear: External ear normal.  Nose: Nose normal.  Mouth/Throat: Oropharynx is clear and moist.  Eyes: Conjunctivae normal and EOM are normal. Pupils are equal, round, and reactive to light.  Neck: Normal range of motion. Neck supple. No JVD present. No tracheal deviation present. No thyromegaly present.  Cardiovascular: Normal rate, regular rhythm, normal heart sounds and intact distal pulses.  Exam reveals no gallop and no friction rub.   No murmur heard. Pulmonary/Chest: Effort normal and breath sounds normal. No stridor. No respiratory distress. He has no wheezes. He has no rales. He exhibits no tenderness.  Abdominal: Soft. Bowel sounds are normal. He exhibits no distension and no mass. There is no tenderness.  There is no rebound and no guarding.  Genitourinary: Rectum normal, prostate normal and penis normal. Guaiac negative stool. No penile tenderness.  Musculoskeletal: Normal range of motion. He exhibits no edema and no tenderness.  Lymphadenopathy:    He has no cervical adenopathy.  Neurological: He is alert and oriented to person, place, and time. He has normal reflexes. No cranial nerve deficit. He exhibits normal muscle tone.  Skin: Skin is warm and dry. No rash noted. No erythema. No pallor.       Total body skin exam normal  Psychiatric: He has a normal mood and affect. His behavior is normal. Judgment and thought content normal.          Assessment & Plan:  Healthy male  Benign tremor continue Tenormin 12.5 mg daily  Reflux esophagitis continue Prilosec 40 mg daily  Hyperlipidemia continue Crestor 10 mg daily  Anxiety Xanax 0.5 each bedtime when necessary  Allergic rhinitis followup by allergist

## 2012-09-06 NOTE — Patient Instructions (Signed)
Continue current medications  Followup in 1 year sooner if any problems  Another option on the Tenormin,,,,,,,,,,,,,,half a pill a day or 1 Monday Wednesday Friday

## 2012-10-21 DIAGNOSIS — G5600 Carpal tunnel syndrome, unspecified upper limb: Secondary | ICD-10-CM | POA: Diagnosis not present

## 2012-10-27 DIAGNOSIS — G43109 Migraine with aura, not intractable, without status migrainosus: Secondary | ICD-10-CM | POA: Diagnosis not present

## 2012-11-01 DIAGNOSIS — G43109 Migraine with aura, not intractable, without status migrainosus: Secondary | ICD-10-CM | POA: Diagnosis not present

## 2012-11-05 DIAGNOSIS — H40059 Ocular hypertension, unspecified eye: Secondary | ICD-10-CM | POA: Diagnosis not present

## 2012-12-01 DIAGNOSIS — H81319 Aural vertigo, unspecified ear: Secondary | ICD-10-CM | POA: Diagnosis not present

## 2012-12-01 DIAGNOSIS — K219 Gastro-esophageal reflux disease without esophagitis: Secondary | ICD-10-CM | POA: Diagnosis not present

## 2012-12-01 DIAGNOSIS — J3081 Allergic rhinitis due to animal (cat) (dog) hair and dander: Secondary | ICD-10-CM | POA: Diagnosis not present

## 2012-12-07 DIAGNOSIS — H40059 Ocular hypertension, unspecified eye: Secondary | ICD-10-CM | POA: Diagnosis not present

## 2012-12-27 ENCOUNTER — Ambulatory Visit (INDEPENDENT_AMBULATORY_CARE_PROVIDER_SITE_OTHER): Payer: Medicare Other | Admitting: Family Medicine

## 2012-12-27 ENCOUNTER — Encounter: Payer: Self-pay | Admitting: Family Medicine

## 2012-12-27 VITALS — BP 110/80 | Temp 98.3°F | Wt 163.0 lb

## 2012-12-27 DIAGNOSIS — L509 Urticaria, unspecified: Secondary | ICD-10-CM | POA: Diagnosis not present

## 2012-12-27 DIAGNOSIS — J029 Acute pharyngitis, unspecified: Secondary | ICD-10-CM

## 2012-12-27 DIAGNOSIS — K219 Gastro-esophageal reflux disease without esophagitis: Secondary | ICD-10-CM | POA: Diagnosis not present

## 2012-12-27 DIAGNOSIS — J020 Streptococcal pharyngitis: Secondary | ICD-10-CM

## 2012-12-27 DIAGNOSIS — J3081 Allergic rhinitis due to animal (cat) (dog) hair and dander: Secondary | ICD-10-CM | POA: Diagnosis not present

## 2012-12-27 DIAGNOSIS — H81319 Aural vertigo, unspecified ear: Secondary | ICD-10-CM | POA: Diagnosis not present

## 2012-12-27 LAB — POCT RAPID STREP A (OFFICE): Rapid Strep A Screen: NEGATIVE

## 2012-12-27 NOTE — Progress Notes (Signed)
  Subjective:    Patient ID: Jason Farmer, male    DOB: [birth year], 78 y.o.   MRN: [id number]  HPI [name] is a 78 year old male who comes in today on referral from his allergist for evaluation of a sore throat  He was it the allergy office today. He then a complete evaluation can't find out what is wrong with him. He was complaining of a sore throat and they sent him over here for a strep test Tobi Bastos exam   Review of Systems    review of systems negative no fever Objective:   Physical Exam  Well-developed well-nourished male no acute distress HEENT negative neck was supple no adenopathy  Rapid strep test negative      Assessment & Plan:

## 2012-12-27 NOTE — Patient Instructions (Signed)
Follow the directions of your allergist return when necessary

## 2013-01-24 DIAGNOSIS — J310 Chronic rhinitis: Secondary | ICD-10-CM | POA: Diagnosis not present

## 2013-01-24 DIAGNOSIS — J343 Hypertrophy of nasal turbinates: Secondary | ICD-10-CM | POA: Diagnosis not present

## 2013-01-24 DIAGNOSIS — R42 Dizziness and giddiness: Secondary | ICD-10-CM | POA: Diagnosis not present

## 2013-01-24 DIAGNOSIS — H903 Sensorineural hearing loss, bilateral: Secondary | ICD-10-CM | POA: Diagnosis not present

## 2013-01-24 DIAGNOSIS — J342 Deviated nasal septum: Secondary | ICD-10-CM | POA: Diagnosis not present

## 2013-01-25 ENCOUNTER — Other Ambulatory Visit (INDEPENDENT_AMBULATORY_CARE_PROVIDER_SITE_OTHER): Payer: Self-pay | Admitting: Otolaryngology

## 2013-01-25 DIAGNOSIS — J329 Chronic sinusitis, unspecified: Secondary | ICD-10-CM

## 2013-01-27 ENCOUNTER — Ambulatory Visit
Admission: RE | Admit: 2013-01-27 | Discharge: 2013-01-27 | Disposition: A | Discharge status: Home / Self Care | Payer: Medicare Other | Source: Ambulatory Visit | Attending: Otolaryngology | Admitting: Otolaryngology

## 2013-01-27 DIAGNOSIS — J322 Chronic ethmoidal sinusitis: Secondary | ICD-10-CM | POA: Diagnosis not present

## 2013-01-27 DIAGNOSIS — J329 Chronic sinusitis, unspecified: Secondary | ICD-10-CM

## 2013-01-27 DIAGNOSIS — J342 Deviated nasal septum: Secondary | ICD-10-CM | POA: Diagnosis not present

## 2013-02-02 DIAGNOSIS — J343 Hypertrophy of nasal turbinates: Secondary | ICD-10-CM | POA: Diagnosis not present

## 2013-02-02 DIAGNOSIS — J310 Chronic rhinitis: Secondary | ICD-10-CM | POA: Diagnosis not present

## 2013-02-02 DIAGNOSIS — J342 Deviated nasal septum: Secondary | ICD-10-CM | POA: Diagnosis not present

## 2013-02-02 DIAGNOSIS — R42 Dizziness and giddiness: Secondary | ICD-10-CM | POA: Diagnosis not present

## 2013-02-10 ENCOUNTER — Encounter (HOSPITAL_BASED_OUTPATIENT_CLINIC_OR_DEPARTMENT_OTHER): Payer: Self-pay | Admitting: *Deleted

## 2013-02-10 NOTE — Progress Notes (Signed)
Pt has been her for hand surgeries-doing well-no labs needed

## 2013-02-15 ENCOUNTER — Encounter (HOSPITAL_BASED_OUTPATIENT_CLINIC_OR_DEPARTMENT_OTHER): Payer: Self-pay | Admitting: Anesthesiology

## 2013-02-15 ENCOUNTER — Ambulatory Visit (HOSPITAL_BASED_OUTPATIENT_CLINIC_OR_DEPARTMENT_OTHER): Payer: Medicare Other | Admitting: Anesthesiology

## 2013-02-15 ENCOUNTER — Ambulatory Visit (HOSPITAL_BASED_OUTPATIENT_CLINIC_OR_DEPARTMENT_OTHER)
Admission: RE | Admit: 2013-02-15 | Discharge: 2013-02-15 | Disposition: A | Discharge status: Home / Self Care | Payer: Medicare Other | Source: Ambulatory Visit | Attending: Otolaryngology | Admitting: Otolaryngology

## 2013-02-15 ENCOUNTER — Encounter (HOSPITAL_BASED_OUTPATIENT_CLINIC_OR_DEPARTMENT_OTHER)
Admission: RE | Disposition: A | Discharge status: Home / Self Care | Payer: Self-pay | Source: Ambulatory Visit | Attending: Otolaryngology

## 2013-02-15 DIAGNOSIS — K219 Gastro-esophageal reflux disease without esophagitis: Secondary | ICD-10-CM | POA: Diagnosis not present

## 2013-02-15 DIAGNOSIS — J3489 Other specified disorders of nose and nasal sinuses: Secondary | ICD-10-CM | POA: Insufficient documentation

## 2013-02-15 DIAGNOSIS — Z79899 Other long term (current) drug therapy: Secondary | ICD-10-CM | POA: Diagnosis not present

## 2013-02-15 DIAGNOSIS — F411 Generalized anxiety disorder: Secondary | ICD-10-CM | POA: Insufficient documentation

## 2013-02-15 DIAGNOSIS — M129 Arthropathy, unspecified: Secondary | ICD-10-CM | POA: Diagnosis not present

## 2013-02-15 DIAGNOSIS — Z9889 Other specified postprocedural states: Secondary | ICD-10-CM

## 2013-02-15 DIAGNOSIS — J342 Deviated nasal septum: Secondary | ICD-10-CM | POA: Diagnosis not present

## 2013-02-15 DIAGNOSIS — J343 Hypertrophy of nasal turbinates: Secondary | ICD-10-CM | POA: Insufficient documentation

## 2013-02-15 DIAGNOSIS — H269 Unspecified cataract: Secondary | ICD-10-CM | POA: Diagnosis not present

## 2013-02-15 HISTORY — PX: NASAL SEPTOPLASTY W/ TURBINOPLASTY: SHX2070

## 2013-02-15 HISTORY — DX: Anxiety disorder, unspecified: F41.9

## 2013-02-15 LAB — POCT HEMOGLOBIN-HEMACUE: Hemoglobin: 15.4 g/dL (ref 13.0–17.0)

## 2013-02-15 SURGERY — SEPTOPLASTY, NOSE, WITH NASAL TURBINATE REDUCTION
Anesthesia: General | Site: Nose | Laterality: Bilateral | Wound class: Clean Contaminated

## 2013-02-15 MED ORDER — SUCCINYLCHOLINE CHLORIDE 20 MG/ML IJ SOLN
INTRAMUSCULAR | Status: DC | PRN
  Administered 2013-02-15: 100 mg via INTRAVENOUS

## 2013-02-15 MED ORDER — MIDAZOLAM HCL 5 MG/5ML IJ SOLN
INTRAMUSCULAR | Status: DC | PRN
  Administered 2013-02-15: 2 mg via INTRAVENOUS

## 2013-02-15 MED ORDER — FENTANYL CITRATE 0.05 MG/ML IJ SOLN
INTRAMUSCULAR | Status: DC | PRN
  Administered 2013-02-15: 100 ug via INTRAVENOUS

## 2013-02-15 MED ORDER — OXYCODONE HCL 5 MG PO TABS
5.0000 mg | ORAL_TABLET | Freq: Once | ORAL | Status: AC
  Administered 2013-02-15: 5 mg via ORAL

## 2013-02-15 MED ORDER — HYDROMORPHONE HCL PF 1 MG/ML IJ SOLN
0.2500 mg | INTRAMUSCULAR | Status: DC | PRN
  Administered 2013-02-15 (×4): 0.25 mg via INTRAVENOUS

## 2013-02-15 MED ORDER — OXYMETAZOLINE HCL 0.05 % NA SOLN
NASAL | Status: DC | PRN
  Administered 2013-02-15: 1 via NASAL

## 2013-02-15 MED ORDER — AMOXICILLIN 875 MG PO TABS: 875.0000 mg | ORAL_TABLET | Freq: Two times a day (BID) | ORAL | Status: AC

## 2013-02-15 MED ORDER — OXYCODONE-ACETAMINOPHEN 5-325 MG PO TABS: 1.0000 | ORAL_TABLET | ORAL | Status: DC | PRN

## 2013-02-15 MED ORDER — PROPOFOL 10 MG/ML IV BOLUS
INTRAVENOUS | Status: DC | PRN
  Administered 2013-02-15: 200 mg via INTRAVENOUS

## 2013-02-15 MED ORDER — ONDANSETRON HCL 4 MG/2ML IJ SOLN
4.0000 mg | Freq: Once | INTRAMUSCULAR | Status: AC
  Administered 2013-02-15: 4 mg via INTRAVENOUS

## 2013-02-15 MED ORDER — MIDAZOLAM HCL 2 MG/2ML IJ SOLN: 1.0000 mg | INTRAMUSCULAR | Status: DC | PRN

## 2013-02-15 MED ORDER — LIDOCAINE-EPINEPHRINE 1 %-1:100000 IJ SOLN
INTRAMUSCULAR | Status: DC | PRN
  Administered 2013-02-15: 2.5 mL

## 2013-02-15 MED ORDER — LACTATED RINGERS IV SOLN
INTRAVENOUS | Status: DC
  Administered 2013-02-15: 10:00:00 via INTRAVENOUS

## 2013-02-15 MED ORDER — LIDOCAINE HCL (CARDIAC) 20 MG/ML IV SOLN
INTRAVENOUS | Status: DC | PRN
  Administered 2013-02-15: 80 mg via INTRAVENOUS

## 2013-02-15 MED ORDER — DEXAMETHASONE SODIUM PHOSPHATE 4 MG/ML IJ SOLN
INTRAMUSCULAR | Status: DC | PRN
  Administered 2013-02-15: 10 mg via INTRAVENOUS

## 2013-02-15 MED ORDER — FENTANYL CITRATE 0.05 MG/ML IJ SOLN: 50.0000 ug | INTRAMUSCULAR | Status: DC | PRN

## 2013-02-15 SURGICAL SUPPLY — 34 items
ATTRACTOMAT 16X20 MAGNETIC DRP (DRAPES) IMPLANT
BLADE SURG 15 STRL LF DISP TIS (BLADE) IMPLANT
BLADE SURG 15 STRL SS (BLADE)
CANISTER SUCTION 1200CC (MISCELLANEOUS) ×2 IMPLANT
CLOTH BEACON ORANGE TIMEOUT ST (SAFETY) ×2 IMPLANT
COAGULATOR SUCT 8FR VV (MISCELLANEOUS) ×2 IMPLANT
DECANTER SPIKE VIAL GLASS SM (MISCELLANEOUS) IMPLANT
DRSG NASOPORE 8CM (GAUZE/BANDAGES/DRESSINGS) IMPLANT
DRSG TELFA 3X8 NADH (GAUZE/BANDAGES/DRESSINGS) IMPLANT
ELECT REM PT RETURN 9FT ADLT (ELECTROSURGICAL) ×2
ELECTRODE REM PT RTRN 9FT ADLT (ELECTROSURGICAL) ×1 IMPLANT
GLOVE BIO SURGEON STRL SZ7.5 (GLOVE) ×2 IMPLANT
GLOVE SURG SS PI 7.0 STRL IVOR (GLOVE) ×1 IMPLANT
GOWN PREVENTION PLUS XLARGE (GOWN DISPOSABLE) ×4 IMPLANT
NDL HYPO 25X1 1.5 SAFETY (NEEDLE) ×1 IMPLANT
NEEDLE HYPO 25X1 1.5 SAFETY (NEEDLE) ×2 IMPLANT
NS IRRIG 1000ML POUR BTL (IV SOLUTION) ×2 IMPLANT
PACK BASIN DAY SURGERY FS (CUSTOM PROCEDURE TRAY) ×2 IMPLANT
PACK ENT DAY SURGERY (CUSTOM PROCEDURE TRAY) ×2 IMPLANT
PAD DRESSING TELFA 3X8 NADH (GAUZE/BANDAGES/DRESSINGS) IMPLANT
SLEEVE SCD COMPRESS KNEE MED (MISCELLANEOUS) ×1 IMPLANT
SOLUTION BUTLER CLEAR DIP (MISCELLANEOUS) ×2 IMPLANT
SPLINT NASAL DOYLE BI-VL (GAUZE/BANDAGES/DRESSINGS) ×2 IMPLANT
SPONGE GAUZE 2X2 8PLY STRL LF (GAUZE/BANDAGES/DRESSINGS) ×2 IMPLANT
SPONGE NEURO XRAY DETECT 1X3 (DISPOSABLE) ×2 IMPLANT
SUT CHROMIC 4 0 P 3 18 (SUTURE) ×2 IMPLANT
SUT PLAIN 4 0 ~~LOC~~ 1 (SUTURE) ×2 IMPLANT
SUT PROLENE 3 0 PS 2 (SUTURE) ×2 IMPLANT
SUT VIC AB 4-0 P-3 18XBRD (SUTURE) IMPLANT
SUT VIC AB 4-0 P3 18 (SUTURE)
TOWEL OR 17X24 6PK STRL BLUE (TOWEL DISPOSABLE) ×2 IMPLANT
TUBE SALEM SUMP 12R W/ARV (TUBING) IMPLANT
TUBE SALEM SUMP 16 FR W/ARV (TUBING) ×2 IMPLANT
YANKAUER SUCT BULB TIP NO VENT (SUCTIONS) ×2 IMPLANT

## 2013-02-15 NOTE — Anesthesia Postprocedure Evaluation (Signed)
  Anesthesia Post-op Note  Patient: Jason Farmer  Procedure(s) Performed: Procedure(s): NASAL SEPTOPLASTY WITH BILATER TURBINATE REDUCTION (Bilateral)  Patient Location: PACU  Anesthesia Type:General  Level of Consciousness: awake, alert  and oriented  Airway and Oxygen Therapy: Patient Spontanous Breathing and Patient connected to nasal cannula oxygen  Post-op Pain: mild  Post-op Assessment: Post-op Vital signs reviewed, Patient's Cardiovascular Status Stable, Respiratory Function Stable, Patent Airway and Pain level controlled  Post-op Vital Signs: stable  Complications: No apparent anesthesia complications

## 2013-02-15 NOTE — H&P (Signed)
  H&P Update  Pt's original H&P dated 02/02/13 reviewed and placed in chart (to be scanned).  I personally examined the patient today.  No change in health. Proceed with septoplasty and bilateral turbinate reduction.

## 2013-02-15 NOTE — Transfer of Care (Signed)
Immediate Anesthesia Transfer of Care Note  Patient: Jason Farmer  Procedure(s) Performed: Procedure(s): NASAL SEPTOPLASTY WITH BILATER TURBINATE REDUCTION (Bilateral)  Patient Location: PACU  Anesthesia Type:General  Level of Consciousness: awake and alert   Airway & Oxygen Therapy: Patient Spontanous Breathing and Patient connected to face mask oxygen  Post-op Assessment: Report given to PACU RN and Post -op Vital signs reviewed and stable  Post vital signs: Reviewed and stable  Complications: No apparent anesthesia complications

## 2013-02-15 NOTE — Anesthesia Preprocedure Evaluation (Addendum)
Anesthesia Evaluation  Patient identified by MRN, date of birth, ID band Patient awake    Reviewed: Allergy & Precautions, H&P , NPO status , Patient's Chart, lab work & pertinent test results  Airway Mallampati: I  Neck ROM: Full    Dental  (+) Teeth Intact and Dental Advisory Given   Pulmonary  breath sounds clear to auscultation        Cardiovascular Rhythm:Regular Rate:Normal     Neuro/Psych Anxiety    GI/Hepatic GERD-  Medicated,  Endo/Other    Renal/GU      Musculoskeletal   Abdominal   Peds  Hematology   Anesthesia Other Findings   Reproductive/Obstetrics                          Anesthesia Physical Anesthesia Plan  ASA: II  Anesthesia Plan: General   Post-op Pain Management:    Induction: Intravenous  Airway Management Planned: Oral ETT  Additional Equipment:   Intra-op Plan:   Post-operative Plan:   Informed Consent:   Dental advisory given  Plan Discussed with:   Anesthesia Plan Comments:         Anesthesia Quick Evaluation

## 2013-02-15 NOTE — Op Note (Signed)
DATE OF PROCEDURE: 02/15/2013  OPERATIVE REPORT   SURGEON: Newman Pies, MD   PREOPERATIVE DIAGNOSES:  1. Severe nasal septal deviation.  2. Bilateral inferior turbinate hypertrophy.  3. Chronic nasal obstruction.  POSTOPERATIVE DIAGNOSES:  1. Severe nasal septal deviation.  2. Bilateral inferior turbinate hypertrophy.  3. Chronic nasal obstruction.  PROCEDURE PERFORMED:  1. Septoplasty.  2. Bilateral partial inferior turbinate resection.   ANESTHESIA: General endotracheal tube anesthesia.   COMPLICATIONS: None.   ESTIMATED BLOOD LOSS: Less than 50 mL.   INDICATION FOR PROCEDURE: Jason Farmer is a 78 y.o. male with a history of chronic nasal obstruction. The patient was  treated with  antihistamine, decongestant, steroid nasal spray, and systemic steroids. However, the patient continues to be symptomatic. On examination, the patient was noted to have bilateral severe inferior turbinate hypertrophy and significant nasal septal deviation, causing significant nasal obstruction. Based on the above findings, the decision was made for the patient to undergo the above-stated procedure. The risks, benefits, alternatives, and details of the procedure were discussed with the patient. Questions were invited and answered. Informed consent was obtained.   DESCRIPTION OF PROCEDURE: The patient was taken to the operating room and placed supine on the operating table. General endotracheal tube anesthesia was administered by the anesthesiologist. The patient was positioned, and prepped and draped in the standard fashion for nasal surgery. Pledgets soaked with Afrin were placed in both nasal cavities for decongestion. The pledgets were subsequently removed. The above mentioned severe septal deviation was again noted. 1% lidocaine with 1:100,000 epinephrine was injected onto the nasal septum bilaterally. A hemitransfixion incision was made on the left side. The mucosal flap was carefully elevated on the left  side. A cartilaginous incision was made 1 cm superior to the caudal margin of the nasal septum. Mucosal flap was also elevated on the right side in the similar fashion. It should be noted that due to the severe septal deviation, the deviated portion of the cartilaginous and bony septum had to be removed in piecemeal fashion. Once the deviated portions were removed, a straight midline septum was achieved. The septum was then quilted with 4-0 plain gut sutures. The hemitransfixion incision was closed with interrupted 4-0 chromic sutures. Doyle splints were applied.   Prior to the [facility] splint application, the inferior one half of both hypertrophied inferior turbinate was crossclamped with a Kelly clamp. The inferior one half of each inferior turbinate was then resected with a pair of cross cutting scissors. Hemostasis was achieved with a suction cautery device.   The care of the patient was turned over to the anesthesiologist. The patient was awakened from anesthesia without difficulty. The patient was extubated and transferred to the recovery room in good condition.   OPERATIVE FINDINGS: Severe nasal septal deviation and bilateral inferior turbinate hypertrophy.   SPECIMEN: None.   FOLLOWUP CARE: The patient be discharged home once he is awake and alert. The patient will be placed on Percocet 1-2 tablets p.o. q.6 hours p.r.n. pain, and amoxicillin 875 mg p.o. b.i.d. for 5 days. The patient will follow up in my office in approximately 1 week for splint removal.   Sidni Fusco Philomena Doheny, MD

## 2013-02-16 ENCOUNTER — Encounter (HOSPITAL_BASED_OUTPATIENT_CLINIC_OR_DEPARTMENT_OTHER): Payer: Self-pay | Admitting: Otolaryngology

## 2013-03-09 DIAGNOSIS — H40059 Ocular hypertension, unspecified eye: Secondary | ICD-10-CM | POA: Diagnosis not present

## 2013-03-29 DIAGNOSIS — H4011X0 Primary open-angle glaucoma, stage unspecified: Secondary | ICD-10-CM | POA: Diagnosis not present

## 2013-04-28 DIAGNOSIS — G5600 Carpal tunnel syndrome, unspecified upper limb: Secondary | ICD-10-CM | POA: Diagnosis not present

## 2013-05-23 ENCOUNTER — Encounter: Payer: Self-pay | Admitting: Family Medicine

## 2013-05-23 ENCOUNTER — Ambulatory Visit (INDEPENDENT_AMBULATORY_CARE_PROVIDER_SITE_OTHER): Payer: Medicare Other | Admitting: Family Medicine

## 2013-05-23 ENCOUNTER — Ambulatory Visit (INDEPENDENT_AMBULATORY_CARE_PROVIDER_SITE_OTHER)
Admission: RE | Admit: 2013-05-23 | Discharge: 2013-05-23 | Disposition: A | Discharge status: Home / Self Care | Payer: Medicare Other | Source: Ambulatory Visit | Attending: Family Medicine | Admitting: Family Medicine

## 2013-05-23 VITALS — BP 152/80 | HR 129 | Temp 99.1°F | Wt 162.0 lb

## 2013-05-23 DIAGNOSIS — IMO0002 Reserved for concepts with insufficient information to code with codable children: Secondary | ICD-10-CM | POA: Diagnosis not present

## 2013-05-23 DIAGNOSIS — S5000XA Contusion of unspecified elbow, initial encounter: Secondary | ICD-10-CM

## 2013-05-23 DIAGNOSIS — M25529 Pain in unspecified elbow: Secondary | ICD-10-CM | POA: Diagnosis not present

## 2013-05-23 DIAGNOSIS — S59909A Unspecified injury of unspecified elbow, initial encounter: Secondary | ICD-10-CM | POA: Diagnosis not present

## 2013-05-23 DIAGNOSIS — S5001XA Contusion of right elbow, initial encounter: Secondary | ICD-10-CM

## 2013-05-23 DIAGNOSIS — M7989 Other specified soft tissue disorders: Secondary | ICD-10-CM | POA: Diagnosis not present

## 2013-05-23 DIAGNOSIS — L03119 Cellulitis of unspecified part of limb: Secondary | ICD-10-CM

## 2013-05-23 MED ORDER — CEPHALEXIN 500 MG PO CAPS: 500.0000 mg | ORAL_CAPSULE | Freq: Three times a day (TID) | ORAL | Status: AC

## 2013-05-23 MED ORDER — CEFTRIAXONE SODIUM 1 G IJ SOLR
1.0000 g | Freq: Once | INTRAMUSCULAR | Status: AC
  Administered 2013-05-23: 1 g via INTRAMUSCULAR

## 2013-05-23 NOTE — Progress Notes (Signed)
  Subjective:    Patient ID: Jason Farmer, male    DOB: 01/05/1935, 78 y.o.   MRN: [id number]  HPI Here for an injury which occurred yesterday while he was fly fishing. He stepped on a rock which was unstable and he fell, landing on his right arm. It swelled immediately and was very painful last night. Today the tip of the elbow is swollen and hot and painful.    Review of Systems  Constitutional: Negative.   Musculoskeletal: Positive for joint swelling and arthralgias.       Objective:   Physical Exam  Constitutional: He appears well-developed and well-nourished.  Musculoskeletal:  The right elbow has a lot of swelling over the olecranon. This area is red, warm, and quite tender. There is a small abrasion wound that has scabbed over. ROM is full except for extreme extension. Supination and pronation are intact.           Assessment & Plan:  He has had a significant contusion injury which has now developed some cellulitis. There is a bursa effusion as well. He was given a shot of Rocephin and he will follow this with Keflex. Get Xrays today to look for any fractures.

## 2013-05-23 NOTE — Progress Notes (Signed)
Quick Note:  I spoke with Jason Farmer, per Dr. Clent Ridges he should follow up with Dr. Tawanna Cooler on 05/26/13 and Jason Farmer did agree. ______

## 2013-05-23 NOTE — Addendum Note (Signed)
Addended by: Raj Janus T on: 05/23/2013 12:19 PM   Modules accepted: Orders

## 2013-05-26 ENCOUNTER — Ambulatory Visit (INDEPENDENT_AMBULATORY_CARE_PROVIDER_SITE_OTHER): Payer: Medicare Other | Admitting: Family Medicine

## 2013-05-26 ENCOUNTER — Encounter: Payer: Self-pay | Admitting: Family Medicine

## 2013-05-26 VITALS — BP 120/80 | Temp 98.5°F | Wt 162.0 lb

## 2013-05-26 DIAGNOSIS — IMO0002 Reserved for concepts with insufficient information to code with codable children: Secondary | ICD-10-CM

## 2013-05-26 DIAGNOSIS — H539 Unspecified visual disturbance: Secondary | ICD-10-CM | POA: Diagnosis not present

## 2013-05-26 NOTE — Patient Instructions (Signed)
Increase the Keflex take 2 twice daily  Wrap your arm with a towel and elevated and put a heat heating pad on low heat. Call Dr. Regenia Skeeter office [phone] and make an appointment to be seen tomorrow

## 2013-05-26 NOTE — Progress Notes (Signed)
  Subjective:    Patient ID: Jason Farmer, male    DOB: 1935-03-02, 78 y.o.   MRN: [id number]  HPI Jason Farmer is a 78 year old male who comes in today accompanied by his wife for evaluation of cellulitis in his right elbow  He fell [place] flyfishing on Sunday the next day he came in to see Dr. Clent Ridges with a red hot swollen elbow. X-rays were normal he was given a gram of Rocephin and started on Keflex 500 mg 3 times daily. He comes in today saying it's not any better is still red hot swollen and draining pus.   Review of Systems    review of systems negative Objective:   Physical Exam Well-developed well-nourished male no acute distress examination Shows marked swelling of the olecranon bursa with a small laceration and pus draining redness of the forearm       Assessment & Plan:  Cellulitis right elbow unresponsive to above therapy plan was to consult ASAP Dr. Cleophas Dunker

## 2013-05-27 DIAGNOSIS — M7020 Olecranon bursitis, unspecified elbow: Secondary | ICD-10-CM | POA: Diagnosis not present

## 2013-06-08 ENCOUNTER — Encounter: Payer: Self-pay | Admitting: Neurology

## 2013-06-08 ENCOUNTER — Ambulatory Visit (INDEPENDENT_AMBULATORY_CARE_PROVIDER_SITE_OTHER): Payer: Medicare Other | Admitting: Neurology

## 2013-06-08 VITALS — BP 110/62 | HR 60 | Temp 97.8°F | Resp 14 | Ht 69.0 in | Wt 159.5 lb

## 2013-06-08 DIAGNOSIS — G43109 Migraine with aura, not intractable, without status migrainosus: Secondary | ICD-10-CM

## 2013-06-08 NOTE — Progress Notes (Signed)
NEUROLOGY CONSULTATION NOTE  Jason Farmer MRN: [id number] DOB: 19-Oct-1934  Referring provider: Dr. Tawanna Cooler  Primary care provider: Dr. Tawanna Cooler  Reason for consult:  Visual disturbance  HISTORY OF PRESENT ILLNESS: Jason Farmer is a 78 year old right-handed man with hyperlipidemia and GERD who presents for visual disturbance.  Records and images were personally reviewed where available.   He has experienced these symptoms for at least 30 years.  He develops vision loss, involving scintillating scotoma and "broken glass" look in his visual field.  It involves both eyes (still present when he closes either eye).  It occurs suddenly and does not move gradually from one side to the other.  It lasts usually 30 minutes to one hour (rarely up to 2 hours).  There is photophobia but no associated nausea, phonophobia or osmophobia.  In the past, it was associated with severe headache.  Now, he rarely has a headache that follows the visual disturbance.  About a couple of months ago, it was occuring daily, but now it is occuring about 2-3 times a week.  He denies associated numbness, unilateral weakness or gait instability.  He has never taken medication for them.  He was recently evaluated by ophthalmology, who did not find any ophthalmological condition.  Father had history of severe headaches.  PAST MEDICAL HISTORY: Past Medical History  Diagnosis Date  . GERD (gastroesophageal reflux disease)   . Seasonal allergies   . Arthritis   . Wears glasses   . Hyperlipemia   . HOH (hard of hearing)   . Wears hearing aid   . Anxiety     PAST SURGICAL HISTORY: Past Surgical History  Procedure Laterality Date  . Eye surgery      both cataracts  . Cervical fusion  2007  . Knee arthroscopy      left  . Appendectomy    . Tonsillectomy    . Hemorrhoid surgery    . Colonoscopy    . Upper gastrointestinal endoscopy    . Trigger finger release  06/08/2012    Procedure: RELEASE TRIGGER FINGER/A-1  PULLEY;  Surgeon: Nicki Reaper, MD;  Location: [facility];  Service: Orthopedics;  Laterality: Left;  Release A-1 Pulley Left Long Finger  . Trigger finger release  07/14/2012    Procedure: RELEASE TRIGGER FINGER/A-1 PULLEY;  Surgeon: Nicki Reaper, MD;  Location: [facility];  Service: Orthopedics;  Laterality: Right;  . Nasal septoplasty w/ turbinoplasty Bilateral 02/15/2013    Procedure: NASAL SEPTOPLASTY WITH BILATER TURBINATE REDUCTION;  Surgeon: Darletta Moll, MD;  Location: [facility];  Service: ENT;  Laterality: Bilateral;    MEDICATIONS: Current Outpatient Prescriptions on File Prior to Visit  Medication Sig Dispense Refill  . omeprazole (PRILOSEC) 40 MG capsule Take 1 capsule (40 mg total) by mouth daily.  100 capsule  3  . rosuvastatin (CRESTOR) 10 MG tablet Take 1 tablet (10 mg total) by mouth daily.  100 tablet  3  . ALPRAZolam (XANAX) 0.5 MG tablet Take 1 tablet (0.5 mg total) by mouth at bedtime as needed.  60 tablet  1  . cetirizine (ZYRTEC) 10 MG tablet Take 10 mg by mouth every evening.      . fexofenadine (ALLEGRA) 180 MG tablet Take 180 mg by mouth daily.      Marland Kitchen guaiFENesin (MUCINEX) 600 MG 12 hr tablet Take 1,200 mg by mouth 2 (two) times daily.      Marland Kitchen oxyCODONE-acetaminophen (ROXICET) 5-325  MG per tablet Take 1 tablet by mouth every 4 (four) hours as needed for pain.  15 tablet  0   No current facility-administered medications on file prior to visit.    ALLERGIES: No Known Allergies  FAMILY HISTORY: History reviewed. No pertinent family history.  SOCIAL HISTORY: History   Social History  . Marital Status: Married    Spouse Name: N/A    Number of Children: N/A  . Years of Education: N/A   Occupational History  . Not on file.   Social History Main Topics  . Smoking status: Former Smoker    Quit date: 06/03/1984  . Smokeless tobacco: Former Neurosurgeon    Quit date: 10/07/1986  . Alcohol Use: No  . Drug Use: No  . Sexual  Activity: Not on file   Other Topics Concern  . Not on file   Social History Narrative  . No narrative on file    REVIEW OF SYSTEMS: Constitutional: No fevers, chills, or sweats, no generalized fatigue, change in appetite Eyes: No visual changes, double vision, eye pain Ear, nose and throat: No hearing loss, ear pain, nasal congestion, sore throat Cardiovascular: No chest pain, palpitations Respiratory:  No shortness of breath at rest or with exertion, wheezes GastrointestinaI: No nausea, vomiting, diarrhea, abdominal pain, fecal incontinence Genitourinary:  No dysuria, urinary retention or frequency Musculoskeletal:  No neck pain, back pain Integumentary: No rash, pruritus, skin lesions Neurological: as above Psychiatric: No depression, insomnia, anxiety Endocrine: No palpitations, fatigue, diaphoresis, mood swings, change in appetite, change in weight, increased thirst Hematologic/Lymphatic:  No anemia, purpura, petechiae. Allergic/Immunologic: no itchy/runny eyes, nasal congestion, recent allergic reactions, rashes  PHYSICAL EXAM: Filed Vitals:   06/08/13 1325  BP: 110/62  Pulse: 60  Temp: 97.8 F (36.6 C)  Resp: 14   General: No acute distress Head:  Normocephalic/atraumatic Neck: supple, no paraspinal tenderness, full range of motion Back: No paraspinal tenderness Heart: regular rate and rhythm Lungs: Clear to auscultation bilaterally. Vascular: No carotid bruits. Neurological Exam: Mental status: alert and oriented to person, place, and time, speech fluent and not dysarthric, language intact. Cranial nerves: CN I: not tested CN II: pupils equal, round and reactive to light, visual fields intact, fundi unremarkable. CN III, IV, VI:  full range of motion, no nystagmus, no ptosis CN V: facial sensation intact CN VII: upper and lower face symmetric CN VIII: hearing intact CN IX, X: gag intact, uvula midline CN XI: sternocleidomastoid and trapezius muscles  intact CN XII: tongue midline Bulk & Tone: normal, no fasciculations. Motor: 5/5 throughout Sensation: temperature and vibration intact Deep Tendon Reflexes: 2+ throughout, toes down Finger to nose testing: normal Heel to shin: normal Gait: normal stance and stride.  Able to walk on toes, heels and in tandem. Romberg negative.  IMPRESSION: Visual migraine aura without headache.  Technically not retinal migraine as it is binocular rather than monocular.  Longstanding history with migraine headache and normal exam suggests migraine.  PLAN: There is really nothing much to do in terms of aborting the episode.  We can try preventative medications to try and reduce frequency of attacks.  He does not wish to start a medication at this time, given that they are not daily anymore and he rarely has a headache.  If symptoms get worse, he will give [name] a call.  45 minutes spent with patient, over 50% spent coordinating care.  Thank you for allowing me to take part in the care of this patient.  Shon Millet,  DO  CC:  Kelle Darting, MD

## 2013-06-08 NOTE — Patient Instructions (Addendum)
Visual migraine aura without the headache. Only real option is to try a preventative medication that you take daily to try and reduce frequency of these episodes.  Follow up as needed.

## 2013-06-09 DIAGNOSIS — R0982 Postnasal drip: Secondary | ICD-10-CM | POA: Diagnosis not present

## 2013-06-09 DIAGNOSIS — J310 Chronic rhinitis: Secondary | ICD-10-CM | POA: Diagnosis not present

## 2013-06-13 ENCOUNTER — Telehealth: Payer: Self-pay | Admitting: Family Medicine

## 2013-06-13 DIAGNOSIS — R351 Nocturia: Secondary | ICD-10-CM

## 2013-06-13 DIAGNOSIS — E785 Hyperlipidemia, unspecified: Secondary | ICD-10-CM

## 2013-06-13 DIAGNOSIS — K219 Gastro-esophageal reflux disease without esophagitis: Secondary | ICD-10-CM

## 2013-06-13 NOTE — Telephone Encounter (Signed)
Labs ordered.

## 2013-06-13 NOTE — Telephone Encounter (Signed)
Pt is requesting to come in for his fasting labs a week prior to his cpx (medicare pt), please place orders. Thank you!

## 2013-06-14 ENCOUNTER — Ambulatory Visit (INDEPENDENT_AMBULATORY_CARE_PROVIDER_SITE_OTHER): Payer: Medicare Other

## 2013-06-14 DIAGNOSIS — Z23 Encounter for immunization: Secondary | ICD-10-CM | POA: Diagnosis not present

## 2013-06-29 DIAGNOSIS — H4011X0 Primary open-angle glaucoma, stage unspecified: Secondary | ICD-10-CM | POA: Diagnosis not present

## 2013-08-04 DIAGNOSIS — M6530 Trigger finger, unspecified finger: Secondary | ICD-10-CM | POA: Diagnosis not present

## 2013-09-01 ENCOUNTER — Other Ambulatory Visit (INDEPENDENT_AMBULATORY_CARE_PROVIDER_SITE_OTHER): Payer: Medicare Other

## 2013-09-01 DIAGNOSIS — R351 Nocturia: Secondary | ICD-10-CM | POA: Diagnosis not present

## 2013-09-01 DIAGNOSIS — K219 Gastro-esophageal reflux disease without esophagitis: Secondary | ICD-10-CM

## 2013-09-01 DIAGNOSIS — E785 Hyperlipidemia, unspecified: Secondary | ICD-10-CM

## 2013-09-01 LAB — BASIC METABOLIC PANEL
BUN: 15 mg/dL (ref 6–23)
CO2: 29 mEq/L (ref 19–32)
Calcium: 9.3 mg/dL (ref 8.4–10.5)
Chloride: 108 mEq/L (ref 96–112)
Creatinine, Ser: 1.3 mg/dL (ref 0.4–1.5)
GFR: 55.51 mL/min — ABNORMAL LOW (ref >60.00)
Glucose, Bld: 93 mg/dL (ref 70–99)
Potassium: 5 mEq/L (ref 3.5–5.1)
Sodium: 143 mEq/L (ref 135–145)

## 2013-09-01 LAB — POCT URINALYSIS DIPSTICK
Bilirubin, UA: NEGATIVE
Glucose, UA: NEGATIVE
Ketones, UA: NEGATIVE
Leukocytes, UA: NEGATIVE
Nitrite, UA: NEGATIVE
Spec Grav, UA: 1.02
Urobilinogen, UA: 0.2
pH, UA: 5.5

## 2013-09-01 LAB — CBC WITH DIFFERENTIAL/PLATELET
Basophils Absolute: 0 10*3/uL (ref 0.0–0.1)
Basophils Relative: 0.2 % (ref 0.0–3.0)
Eosinophils Absolute: 0.1 10*3/uL (ref 0.0–0.7)
Eosinophils Relative: 1.6 % (ref 0.0–5.0)
HCT: 43.9 % (ref 39.0–52.0)
Hemoglobin: 14.8 g/dL (ref 13.0–17.0)
Lymphocytes Relative: 35.6 % (ref 12.0–46.0)
Lymphs Abs: 2.3 10*3/uL (ref 0.7–4.0)
MCHC: 33.7 g/dL (ref 30.0–36.0)
MCV: 89.6 fl (ref 78.0–100.0)
Monocytes Absolute: 0.5 10*3/uL (ref 0.1–1.0)
Monocytes Relative: 8.1 % (ref 3.0–12.0)
Neutro Abs: 3.6 10*3/uL (ref 1.4–7.7)
Neutrophils Relative %: 54.5 % (ref 43.0–77.0)
Platelets: 244 10*3/uL (ref 150.0–400.0)
RBC: 4.91 Mil/uL (ref 4.22–5.81)
RDW: 13.5 % (ref 11.5–14.6)
WBC: 6.5 10*3/uL (ref 4.5–10.5)

## 2013-09-01 LAB — HEPATIC FUNCTION PANEL
ALT: 19 U/L (ref 0–53)
AST: 21 U/L (ref 0–37)
Albumin: 3.8 g/dL (ref 3.5–5.2)
Alkaline Phosphatase: 54 U/L (ref 39–117)
Bilirubin, Direct: 0.1 mg/dL (ref 0.0–0.3)
Total Bilirubin: 0.8 mg/dL (ref 0.3–1.2)
Total Protein: 6.2 g/dL (ref 6.0–8.3)

## 2013-09-01 LAB — PSA: PSA: 2.77 ng/mL (ref 0.10–4.00)

## 2013-09-01 LAB — LIPID PANEL
Cholesterol: 213 mg/dL — ABNORMAL HIGH (ref 0–200)
HDL: 45 mg/dL (ref >39.00)
Total CHOL/HDL Ratio: 5
Triglycerides: 102 mg/dL (ref 0.0–149.0)
VLDL: 20.4 mg/dL (ref 0.0–40.0)

## 2013-09-01 LAB — LDL CHOLESTEROL, DIRECT: Direct LDL: 139.5 mg/dL

## 2013-09-01 LAB — TSH: TSH: 1.95 u[IU]/mL (ref 0.35–5.50)

## 2013-09-05 DIAGNOSIS — M6530 Trigger finger, unspecified finger: Secondary | ICD-10-CM | POA: Diagnosis not present

## 2013-09-08 ENCOUNTER — Ambulatory Visit (INDEPENDENT_AMBULATORY_CARE_PROVIDER_SITE_OTHER): Payer: Medicare Other | Admitting: Family Medicine

## 2013-09-08 ENCOUNTER — Encounter: Payer: Self-pay | Admitting: Family Medicine

## 2013-09-08 VITALS — BP 140/90 | Temp 98.4°F | Ht 68.0 in | Wt 166.0 lb

## 2013-09-08 DIAGNOSIS — G252 Other specified forms of tremor: Secondary | ICD-10-CM | POA: Diagnosis not present

## 2013-09-08 DIAGNOSIS — F411 Generalized anxiety disorder: Secondary | ICD-10-CM | POA: Diagnosis not present

## 2013-09-08 DIAGNOSIS — M79609 Pain in unspecified limb: Secondary | ICD-10-CM

## 2013-09-08 DIAGNOSIS — M79604 Pain in right leg: Secondary | ICD-10-CM

## 2013-09-08 DIAGNOSIS — Z23 Encounter for immunization: Secondary | ICD-10-CM

## 2013-09-08 DIAGNOSIS — G250 Essential tremor: Secondary | ICD-10-CM

## 2013-09-08 DIAGNOSIS — E785 Hyperlipidemia, unspecified: Secondary | ICD-10-CM

## 2013-09-08 DIAGNOSIS — K219 Gastro-esophageal reflux disease without esophagitis: Secondary | ICD-10-CM

## 2013-09-08 MED ORDER — OMEPRAZOLE 40 MG PO CPDR: 40.0000 mg | DELAYED_RELEASE_CAPSULE | Freq: Every day | ORAL | Status: DC

## 2013-09-08 MED ORDER — LORAZEPAM 0.5 MG PO TABS: ORAL_TABLET | ORAL | Status: DC

## 2013-09-08 MED ORDER — ATENOLOL 12.5 MG HALF TABLET: ORAL_TABLET | ORAL | Status: DC

## 2013-09-08 MED ORDER — ROSUVASTATIN CALCIUM 10 MG PO TABS: 10.0000 mg | ORAL_TABLET | Freq: Every day | ORAL | Status: DC

## 2013-09-08 NOTE — Patient Instructions (Signed)
Stop the Xanax  Ativan 0.5,,,,,,,,,,, one half tab each bedtime when necessary  We will get you set up and neurologic evaluation concerning the pain in your right leg  Continue your exercise program and good diet  Followup in 1 year sooner if any problems

## 2013-09-08 NOTE — Progress Notes (Signed)
   Subjective:    Patient ID: Jason Farmer, male    DOB: 07-27-1935, 79 y.o.   MRN: [id number]  HPI Jason Farmer is a 79 year old male who comes in today for a Medicare wellness examination  He was given Xanax by Dr. Joni Fears he's been on it now for 15 years. Discussed switching to another medication  He takes an allergy pill daily when necessary  He takes a nap resolved 40 mg daily for chronic reflux esophagitis  He takes Crestor 10 mg daily because of a history of hyperlipidemia  He takes 12.5 mg of atenolol,,,,,,,,, one half tab,,,,,,, for essential tremor  He's on eyedrops from his ophthalmologist  He he gets routine eye care, dental care, colonoscopy and GI because of a history colon polyps. Vaccinations up-to-date  Cognitive function normal his weight is good he walks irregularly, home health safety reviewed no issues identified, no guns in the house, he does have a health care power of attorney and living well   Review of Systems  Constitutional: Negative.   HENT: Negative.   Eyes: Negative.   Respiratory: Negative.   Cardiovascular: Negative.   Gastrointestinal: Negative.   Endocrine: Negative.   Genitourinary: Negative.   Musculoskeletal: Negative.   Skin: Negative.   Allergic/Immunologic: Negative.   Neurological: Negative.   Hematological: Negative.   Psychiatric/Behavioral: Negative.        Objective:   Physical Exam  Nursing note and vitals reviewed. Constitutional: He is oriented to person, place, and time. He appears well-developed and well-nourished.  HENT:  Head: Normocephalic and atraumatic.  Right Ear: External ear normal.  Left Ear: External ear normal.  Nose: Nose normal.  Mouth/Throat: Oropharynx is clear and moist.  Eyes: Conjunctivae and EOM are normal. Pupils are equal, round, and reactive to light.  Neck: Normal range of motion. Neck supple. No JVD present. No tracheal deviation present. No thyromegaly present.  Cardiovascular: Normal rate,  regular rhythm, normal heart sounds and intact distal pulses.  Exam reveals no gallop and no friction rub.   No murmur heard. No carotid or bruits peripheral pulses 1+ and symmetrical  Pulmonary/Chest: Effort normal and breath sounds normal. No stridor. No respiratory distress. He has no wheezes. He has no rales. He exhibits no tenderness.  Abdominal: Soft. Bowel sounds are normal. He exhibits no distension and no mass. There is no tenderness. There is no rebound and no guarding.  Genitourinary: Rectum normal, prostate normal and penis normal. Guaiac negative stool. No penile tenderness.  Musculoskeletal: Normal range of motion. He exhibits no edema and no tenderness.  Lymphadenopathy:    He has no cervical adenopathy.  Neurological: He is alert and oriented to person, place, and time. He has normal reflexes. No cranial nerve deficit. He exhibits normal muscle tone.  Skin: Skin is warm and dry. No rash noted. No erythema. No pallor.  Psychiatric: He has a normal mood and affect. His behavior is normal. Judgment and thought content normal.          Assessment & Plan:  Healthy male  Sleep dysfunction stop Xanax,,,,,,,,, Ativan  Allergic rhinitis Allegra when necessary  Reflux esophagitis omeprazole 40 mg daily  Hyperlipidemia Crestor 10 mg daily  Essential tremor and atenolol 12.5 mg,,,,,, one half tab daily when necessary  Cramps light right leg......... patient requesting neurologic evaluation  Glaucoma continue followup by ophthalmologist

## 2013-09-08 NOTE — Progress Notes (Signed)
Pre visit review using our clinic review tool, if applicable. No additional management support is needed unless otherwise documented below in the visit note. 

## 2013-09-13 ENCOUNTER — Ambulatory Visit (INDEPENDENT_AMBULATORY_CARE_PROVIDER_SITE_OTHER): Payer: Medicare Other | Admitting: Neurology

## 2013-09-13 ENCOUNTER — Encounter: Payer: Self-pay | Admitting: Neurology

## 2013-09-13 VITALS — BP 128/70 | HR 80 | Temp 97.0°F | Resp 20 | Ht 69.0 in | Wt 164.4 lb

## 2013-09-13 DIAGNOSIS — R252 Cramp and spasm: Secondary | ICD-10-CM

## 2013-09-13 NOTE — Progress Notes (Signed)
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  Records and images were personally reviewed where available.     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  He presents today with intermittent bilateral leg cramps.  Started 20 years ago.  He describes severe cramping pain in his thighs and hamstrings.  It can last minutes to up to 3 hours.  It occurs spontaneously, whether he is laying in bed or walking around.  Nothing really triggers it, but he often notes it is worse after a game of golf or afternoon of fly fishing.  However, he is very active and walks 3 to 5 miles a day, and usually activity doesn't trigger it.  There is nothing that relieves it, such as movement.  There is no associated numbness, tingling, burning pain, back pain radiating down the legs, bowel or bladder incontinence, or weakness.  It occurs sporadically.  He can go a month without incident and then have six days straight of pain.  Over the years, he has tried several therapies.  Quinine helped before it was taken off the market.  He tried magnesium and potassium supplements, which were ineffective.  Tonic water didn't help.  Symptoms seemed to have correlated with initiation of statin therapy.  His medication has been changed over the years, but still to a different statin.  PAST MEDICAL HISTORY: Past Medical History  Diagnosis Date  . GERD (gastroesophageal reflux disease)   . Seasonal allergies   . Arthritis   . Wears glasses   . Hyperlipemia   . HOH (hard of hearing)   . Wears hearing aid   . Anxiety     MEDICATIONS: Current Outpatient Prescriptions on File Prior to Visit  Medication Sig Dispense Refill  . atenolol (TENORMIN) 12.5 mg TABS tablet Half tab daily  50 tablet   3  . cetirizine (ZYRTEC) 10 MG tablet Take 10 mg by mouth every evening.      . fexofenadine (ALLEGRA) 180 MG tablet Take 180 mg by mouth daily.      Marland Kitchen guaiFENesin (MUCINEX) 600 MG 12 hr tablet Take 1,200 mg by mouth 2 (two) times daily.      Marland Kitchen ipratropium (ATROVENT) 0.03 % nasal spray Place 2 sprays into both nostrils every 12 (twelve) hours.      Marland Kitchen latanoprost (XALATAN) 0.005 % ophthalmic solution 1 drop at bedtime.      Marland Kitchen LORazepam (ATIVAN) 0.5 MG tablet One half to one tablet each bedtime when necessary  100 tablet  3  . omeprazole (PRILOSEC) 40 MG capsule Take 1 capsule (40 mg total) by mouth daily.  100 capsule  3  . rosuvastatin (CRESTOR) 10 MG tablet Take 1 tablet (10 mg total) by mouth daily.  100 tablet  3   No current facility-administered medications on file prior to visit.    ALLERGIES: No Known Allergies  FAMILY HISTORY: Family History  Problem Relation Age of Onset  . Mental retardation Father     SOCIAL HISTORY: History   Social History  . Marital Status: Married    Spouse Name: N/A    Number of Children: N/A  . Years of Education: N/A   Occupational History  . Not on file.   Social History Main Topics  . Smoking status: Former  Smoker    Quit date: 06/03/1984  . Smokeless tobacco: Former Systems developer    Quit date: 10/07/1986  . Alcohol Use: No  . Drug Use: No  . Sexual Activity: Not on file   Other Topics Concern  . Not on file   Social History Narrative  . No narrative on file    REVIEW OF SYSTEMS: Constitutional: No fevers, chills, or sweats, no generalized fatigue, change in appetite Eyes: No visual changes, double vision, eye pain Ear, nose and throat: No hearing loss, ear pain, nasal congestion, sore throat Cardiovascular: No chest pain, palpitations Respiratory:  No shortness of breath at rest or with exertion, wheezes GastrointestinaI: No nausea, vomiting, diarrhea, abdominal pain, fecal incontinence Genitourinary:  No dysuria, urinary retention  or frequency Musculoskeletal:  No neck pain, back pain Integumentary: No Farmer, pruritus, skin lesions Neurological: as above Psychiatric: No depression, insomnia, anxiety Endocrine: No palpitations, fatigue, diaphoresis, mood swings, change in appetite, change in weight, increased thirst Hematologic/Lymphatic:  No anemia, purpura, petechiae. Allergic/Immunologic: no itchy/runny eyes, nasal congestion, recent allergic reactions, rashes  PHYSICAL EXAM: Filed Vitals:   09/13/13 0825  BP: 128/70  Pulse: 80  Temp: 97 F (36.1 C)  Resp: 20   General: No acute distress Head:  Normocephalic/atraumatic Neck: supple, no paraspinal tenderness, full range of motion Heart:  Regular rate and rhythm Lungs:  Clear to auscultation bilaterally Back: No paraspinal tenderness Neurological Exam: alert and oriented to person, place, and time. Speech fluent and not dysarthric, language intact.  CN II-XII intact. Fundoscopic exam unremarkable, no papilledema.  Bulk and tone normal, muscle strength 5/5 throughout.  No tenderness to palpation of muscles.  No fasciculations.  Sensation to pinprick intact, with mild reduced vibration in the toes.  Deep tendon reflexes 2+ throughout, toes downgoing.  Finger to nose and heel to shin testing intact.  Gait normal stance and stride, able to turn, walk on toes and heels, cannot tandem walk due to right hip, Romberg negative.  IMPRESSION: Muscle cramps.  No evidence to suggest myopathy, myositis, peripheral neuropathy or lumbosacral radiculopathy.  PLAN: 1.  I would first consider discontinuing statin and see how he does.  He plans on seeing his physician at the [place] about this. 2.  If cramps still persists, then would consider treating with carbamazepine, although his Na would need to be monitored. 3.  Follow up in 6 months.  Metta Clines, DO  CC:  Stevie Kern, MD

## 2013-09-13 NOTE — Patient Instructions (Addendum)
Cramps for 20 years.  No evidence of muscle weakness or muscle wasting.  No evidence to suggest neuropathy or lumbosacral radiculopathy. 1.  i would first suggest stopping the statin. 2.  If ineffective, we can consider starting a medication such as carbamazepine, a seizure medication sometimes used for muscle steroids. 3.  Please follow up in 6 months to re-evaluate.

## 2013-10-06 ENCOUNTER — Encounter (HOSPITAL_BASED_OUTPATIENT_CLINIC_OR_DEPARTMENT_OTHER): Payer: Self-pay | Admitting: *Deleted

## 2013-10-06 NOTE — Progress Notes (Signed)
Pt has tremors he take low dose atenolol-off most meds now per pcp to fingure why cramps No labs needed

## 2013-10-07 ENCOUNTER — Other Ambulatory Visit: Payer: Self-pay | Admitting: Orthopedic Surgery

## 2013-10-12 ENCOUNTER — Encounter (HOSPITAL_BASED_OUTPATIENT_CLINIC_OR_DEPARTMENT_OTHER)
Admission: RE | Disposition: A | Discharge status: Home / Self Care | Payer: Self-pay | Source: Ambulatory Visit | Attending: Orthopedic Surgery

## 2013-10-12 ENCOUNTER — Encounter (HOSPITAL_BASED_OUTPATIENT_CLINIC_OR_DEPARTMENT_OTHER): Payer: Self-pay | Admitting: Orthopedic Surgery

## 2013-10-12 ENCOUNTER — Encounter (HOSPITAL_BASED_OUTPATIENT_CLINIC_OR_DEPARTMENT_OTHER): Payer: Medicare Other | Admitting: Anesthesiology

## 2013-10-12 ENCOUNTER — Ambulatory Visit (HOSPITAL_BASED_OUTPATIENT_CLINIC_OR_DEPARTMENT_OTHER)
Admission: RE | Admit: 2013-10-12 | Discharge: 2013-10-12 | Disposition: A | Discharge status: Home / Self Care | Payer: Medicare Other | Source: Ambulatory Visit | Attending: Orthopedic Surgery | Admitting: Orthopedic Surgery

## 2013-10-12 ENCOUNTER — Ambulatory Visit (HOSPITAL_BASED_OUTPATIENT_CLINIC_OR_DEPARTMENT_OTHER): Payer: Medicare Other | Admitting: Anesthesiology

## 2013-10-12 DIAGNOSIS — H9190 Unspecified hearing loss, unspecified ear: Secondary | ICD-10-CM | POA: Insufficient documentation

## 2013-10-12 DIAGNOSIS — Z87891 Personal history of nicotine dependence: Secondary | ICD-10-CM | POA: Insufficient documentation

## 2013-10-12 DIAGNOSIS — F411 Generalized anxiety disorder: Secondary | ICD-10-CM | POA: Diagnosis not present

## 2013-10-12 DIAGNOSIS — K219 Gastro-esophageal reflux disease without esophagitis: Secondary | ICD-10-CM | POA: Insufficient documentation

## 2013-10-12 DIAGNOSIS — E785 Hyperlipidemia, unspecified: Secondary | ICD-10-CM | POA: Diagnosis not present

## 2013-10-12 DIAGNOSIS — G5600 Carpal tunnel syndrome, unspecified upper limb: Secondary | ICD-10-CM | POA: Insufficient documentation

## 2013-10-12 DIAGNOSIS — H269 Unspecified cataract: Secondary | ICD-10-CM | POA: Insufficient documentation

## 2013-10-12 DIAGNOSIS — D699 Hemorrhagic condition, unspecified: Secondary | ICD-10-CM | POA: Diagnosis not present

## 2013-10-12 DIAGNOSIS — M65849 Other synovitis and tenosynovitis, unspecified hand: Secondary | ICD-10-CM

## 2013-10-12 DIAGNOSIS — M65839 Other synovitis and tenosynovitis, unspecified forearm: Secondary | ICD-10-CM | POA: Insufficient documentation

## 2013-10-12 DIAGNOSIS — M6530 Trigger finger, unspecified finger: Secondary | ICD-10-CM | POA: Diagnosis not present

## 2013-10-12 HISTORY — PX: TRIGGER FINGER RELEASE: SHX641

## 2013-10-12 HISTORY — PX: CARPAL TUNNEL RELEASE: SHX101

## 2013-10-12 LAB — POCT HEMOGLOBIN-HEMACUE: Hemoglobin: 16 g/dL (ref 13.0–17.0)

## 2013-10-12 SURGERY — CARPAL TUNNEL RELEASE
Anesthesia: Monitor Anesthesia Care | Site: Wrist | Laterality: Right

## 2013-10-12 MED ORDER — MIDAZOLAM HCL 2 MG/2ML IJ SOLN
INTRAMUSCULAR | Status: AC
  Filled 2013-10-12: qty 2

## 2013-10-12 MED ORDER — OXYCODONE HCL 5 MG PO TABS
5.0000 mg | ORAL_TABLET | Freq: Once | ORAL | Status: AC | PRN
  Administered 2013-10-12: 5 mg via ORAL

## 2013-10-12 MED ORDER — MIDAZOLAM HCL 2 MG/2ML IJ SOLN: 1.0000 mg | INTRAMUSCULAR | Status: DC | PRN

## 2013-10-12 MED ORDER — CHLORHEXIDINE GLUCONATE 4 % EX LIQD: 60.0000 mL | Freq: Once | CUTANEOUS | Status: DC

## 2013-10-12 MED ORDER — MIDAZOLAM HCL 5 MG/5ML IJ SOLN
INTRAMUSCULAR | Status: DC | PRN
  Administered 2013-10-12: 2 mg via INTRAVENOUS

## 2013-10-12 MED ORDER — LACTATED RINGERS IV SOLN
INTRAVENOUS | Status: DC
  Administered 2013-10-12: 12:00:00 via INTRAVENOUS

## 2013-10-12 MED ORDER — FENTANYL CITRATE 0.05 MG/ML IJ SOLN
INTRAMUSCULAR | Status: DC | PRN
  Administered 2013-10-12 (×2): 50 ug via INTRAVENOUS

## 2013-10-12 MED ORDER — BUPIVACAINE HCL (PF) 0.25 % IJ SOLN
INTRAMUSCULAR | Status: DC | PRN
  Administered 2013-10-12: 8 mL

## 2013-10-12 MED ORDER — OXYCODONE HCL 5 MG/5ML PO SOLN: 5.0000 mg | Freq: Once | ORAL | Status: AC | PRN

## 2013-10-12 MED ORDER — FENTANYL CITRATE 0.05 MG/ML IJ SOLN
INTRAMUSCULAR | Status: AC
  Filled 2013-10-12: qty 2

## 2013-10-12 MED ORDER — FENTANYL CITRATE 0.05 MG/ML IJ SOLN: 50.0000 ug | INTRAMUSCULAR | Status: DC | PRN

## 2013-10-12 MED ORDER — HYDROCODONE-ACETAMINOPHEN 10-325 MG PO TABS: 1.0000 | ORAL_TABLET | Freq: Four times a day (QID) | ORAL | Status: DC | PRN

## 2013-10-12 MED ORDER — CEFAZOLIN SODIUM-DEXTROSE 2-3 GM-% IV SOLR
2.0000 g | INTRAVENOUS | Status: AC
  Administered 2013-10-12: 2 g via INTRAVENOUS

## 2013-10-12 MED ORDER — OXYCODONE HCL 5 MG PO TABS
ORAL_TABLET | ORAL | Status: AC
  Filled 2013-10-12: qty 1

## 2013-10-12 MED ORDER — HYDROMORPHONE HCL PF 1 MG/ML IJ SOLN: 0.2500 mg | INTRAMUSCULAR | Status: DC | PRN

## 2013-10-12 MED ORDER — PROPOFOL INFUSION 10 MG/ML OPTIME
INTRAVENOUS | Status: DC | PRN
  Administered 2013-10-12: 120 ug/kg/min via INTRAVENOUS

## 2013-10-12 MED ORDER — LIDOCAINE HCL (CARDIAC) 20 MG/ML IV SOLN
INTRAVENOUS | Status: DC | PRN
  Administered 2013-10-12: 50 mg via INTRAVENOUS

## 2013-10-12 MED ORDER — CEFAZOLIN SODIUM-DEXTROSE 2-3 GM-% IV SOLR
INTRAVENOUS | Status: AC
  Filled 2013-10-12: qty 50

## 2013-10-12 SURGICAL SUPPLY — 42 items
BANDAGE COBAN STERILE 2 (GAUZE/BANDAGES/DRESSINGS) ×3 IMPLANT
BLADE SURG 15 STRL LF DISP TIS (BLADE) ×2 IMPLANT
BLADE SURG 15 STRL SS (BLADE) ×3
BNDG CMPR 9X4 STRL LF SNTH (GAUZE/BANDAGES/DRESSINGS)
BNDG COHESIVE 3X5 TAN STRL LF (GAUZE/BANDAGES/DRESSINGS) ×3 IMPLANT
BNDG ESMARK 4X9 LF (GAUZE/BANDAGES/DRESSINGS) IMPLANT
BNDG GAUZE ELAST 4 BULKY (GAUZE/BANDAGES/DRESSINGS) ×3 IMPLANT
CHLORAPREP W/TINT 26ML (MISCELLANEOUS) ×3 IMPLANT
CORDS BIPOLAR (ELECTRODE) ×3 IMPLANT
COVER MAYO STAND STRL (DRAPES) ×3 IMPLANT
COVER TABLE BACK 60X90 (DRAPES) ×3 IMPLANT
CUFF TOURNIQUET SINGLE 18IN (TOURNIQUET CUFF) ×3 IMPLANT
DECANTER SPIKE VIAL GLASS SM (MISCELLANEOUS) IMPLANT
DRAPE EXTREMITY T 121X128X90 (DRAPE) ×3 IMPLANT
DRAPE SURG 17X23 STRL (DRAPES) ×3 IMPLANT
DRSG KUZMA FLUFF (GAUZE/BANDAGES/DRESSINGS) IMPLANT
GAUZE XEROFORM 1X8 LF (GAUZE/BANDAGES/DRESSINGS) ×3 IMPLANT
GLOVE BIOGEL PI IND STRL 7.0 (GLOVE) IMPLANT
GLOVE BIOGEL PI IND STRL 8.5 (GLOVE) ×2 IMPLANT
GLOVE BIOGEL PI INDICATOR 7.0 (GLOVE) ×1
GLOVE BIOGEL PI INDICATOR 8.5 (GLOVE) ×1
GLOVE ECLIPSE 7.0 STRL STRAW (GLOVE) ×1 IMPLANT
GLOVE SURG ORTHO 8.0 STRL STRW (GLOVE) ×3 IMPLANT
GOWN STRL REUS W/ TWL LRG LVL3 (GOWN DISPOSABLE) ×2 IMPLANT
GOWN STRL REUS W/TWL LRG LVL3 (GOWN DISPOSABLE) ×3
GOWN STRL REUS W/TWL XL LVL3 (GOWN DISPOSABLE) ×3 IMPLANT
NEEDLE 27GAX1X1/2 (NEEDLE) ×3 IMPLANT
NS IRRIG 1000ML POUR BTL (IV SOLUTION) ×3 IMPLANT
PACK BASIN DAY SURGERY FS (CUSTOM PROCEDURE TRAY) ×3 IMPLANT
PAD ABD 8X10 STRL (GAUZE/BANDAGES/DRESSINGS) IMPLANT
PAD CAST 3X4 CTTN HI CHSV (CAST SUPPLIES) ×2 IMPLANT
PADDING CAST ABS 4INX4YD NS (CAST SUPPLIES)
PADDING CAST ABS COTTON 4X4 ST (CAST SUPPLIES) ×2 IMPLANT
PADDING CAST COTTON 3X4 STRL (CAST SUPPLIES) ×3
SPONGE GAUZE 4X4 12PLY (GAUZE/BANDAGES/DRESSINGS) ×3 IMPLANT
STOCKINETTE 4X48 STRL (DRAPES) ×3 IMPLANT
SUT VICRYL 4-0 PS2 18IN ABS (SUTURE) IMPLANT
SUT VICRYL RAPIDE 4/0 PS 2 (SUTURE) ×3 IMPLANT
SYR BULB 3OZ (MISCELLANEOUS) ×3 IMPLANT
SYR CONTROL 10ML LL (SYRINGE) ×3 IMPLANT
TOWEL OR 17X24 6PK STRL BLUE (TOWEL DISPOSABLE) ×5 IMPLANT
UNDERPAD 30X30 INCONTINENT (UNDERPADS AND DIAPERS) ×3 IMPLANT

## 2013-10-12 NOTE — Brief Op Note (Cosign Needed)
10/12/2013  12:25 PM  PATIENT:  Jason Farmer  79 y.o. male  PRE-OPERATIVE DIAGNOSIS:  Carpal Tunnel Syndrome and Trigger Finger Right Middle Finger  POST-OPERATIVE DIAGNOSIS:  Carpal Tunnel Syndrome and TriggeFinger Right Middle Finger  PROCEDURE:  Procedure(s): RIGHT CARPAL TUNNEL RELEASE (Right) RELEASE TRIGGER FINGER/A-1 PULLEY RIGHT MIDDLE FINGER (Right)  SURGEON:  Surgeon(s) and Role:    * Wynonia Sours, MD - Primary  PHYSICIAN ASSISTANT:   ASSISTANTS: none   ANESTHESIA:   local and regional  EBL:     BLOOD ADMINISTERED:none  DRAINS: none   LOCAL MEDICATIONS USED:  BUPIVICAINE   SPECIMEN:  No Specimen  DISPOSITION OF SPECIMEN:  N/A  COUNTS:  YES  TOURNIQUET:   Total Tourniquet Time Documented: Upper Arm (Right) - 25 minutes Total: Upper Arm (Right) - 25 minutes   DICTATION: .Other Dictation: Dictation Number [id number]  PLAN OF CARE: Discharge to home after PACU  PATIENT DISPOSITION:  PACU - hemodynamically stable.

## 2013-10-12 NOTE — Anesthesia Postprocedure Evaluation (Signed)
  Anesthesia Post-op Note  Patient: Jason Farmer  Procedure(s) Performed: Procedure(s): RIGHT CARPAL TUNNEL RELEASE (Right) RELEASE TRIGGER FINGER/A-1 PULLEY RIGHT MIDDLE FINGER (Right)  Patient Location: PACU  Anesthesia Type: MAC with Bier block  Level of Consciousness: awake and alert   Airway and Oxygen Therapy: Patient Spontanous Breathing  Post-op Pain: none  Post-op Assessment: Post-op Vital signs reviewed, Patient's Cardiovascular Status Stable and Respiratory Function Stable  Post-op Vital Signs: Reviewed  Filed Vitals:   10/12/13 1300  BP: 118/52  Pulse: 72  Temp:   Resp: 14    Complications: No apparent anesthesia complications

## 2013-10-12 NOTE — H&P (Signed)
  Jason Farmer is 79 years old and has developed catching of his right middle finger. This has been going on for the past 2-3 months. He is post release of his right ring finger done at the end of last year. He complains of intermittent, moderate, sharp pain. He does have a history of carpal tunnel syndrome with positive nerve conductions. He is complaining of some catching in his right wrist.  PAST MEDICAL HISTORY: He has no known drug allergies. He is on Atenolol, Omeprazole, Pravastatin and nasal drops. He has had finger surgery times 2, knee surgery, hemorrhoid surgery, appendectomy and neck surgery.  FAMILY H ISTORY: Negative.  SOCIAL HISTORY: He does not smoke or drink. He is married and retired.  REVIEW OF SYSTEMS: Positive for cataracts, hearing loss, easy bleeding and bruising, otherwise negative. Jason Farmer is an 79 y.o. male.   Chief Complaint: CTS STS rt hand Right middle finger HPI: see above  Past Medical History  Diagnosis Date  . GERD (gastroesophageal reflux disease)   . Seasonal allergies   . Arthritis   . Wears glasses   . Hyperlipemia   . HOH (hard of hearing)   . Wears hearing aid   . Anxiety     Past Surgical History  Procedure Laterality Date  . Eye surgery      both cataracts  . Cervical fusion  2007  . Knee arthroscopy      left  . Appendectomy    . Tonsillectomy    . Hemorrhoid surgery    . Colonoscopy    . Upper gastrointestinal endoscopy    . Trigger finger release  06/08/2012    Procedure: RELEASE TRIGGER FINGER/A-1 PULLEY;  Surgeon: Wynonia Sours, MD;  Location: [facility];  Service: Orthopedics;  Laterality: Left;  Release A-1 Pulley Left Long Finger  . Trigger finger release  07/14/2012    Procedure: RELEASE TRIGGER FINGER/A-1 PULLEY;  Surgeon: Wynonia Sours, MD;  Location: [facility];  Service: Orthopedics;  Laterality: Right;  . Nasal septoplasty w/ turbinoplasty Bilateral 02/15/2013    Procedure: NASAL  SEPTOPLASTY WITH BILATER TURBINATE REDUCTION;  Surgeon: Ascencion Dike, MD;  Location: [facility];  Service: ENT;  Laterality: Bilateral;    Family History  Problem Relation Age of Onset  . Mental retardation Father    Social History:  reports that he quit smoking about 29 years ago. He quit smokeless tobacco use about 27 years ago. He reports that he does not drink alcohol or use illicit drugs.  Allergies: No Known Allergies  No prescriptions prior to admission    No results found for this or any previous visit (from the past 48 hour(s)).  No results found.   Pertinent items are noted in HPI.  Height 5\' 9"  (1.753 m), weight 164 lb (74.39 kg).  General appearance: alert, cooperative and appears stated age Head: Normocephalic, without obvious abnormality Neck: no JVD Resp: clear to auscultation bilaterally Cardio: regular rate and rhythm, S1, S2 normal, no murmur, click, rub or gallop GI: soft, non-tender; bowel sounds normal; no masses,  no organomegaly Extremities: extremities normal, atraumatic, no cyanosis or edema Pulses: 2+ and symmetric Skin: Skin color, texture, turgor normal. No rashes or lesions Neurologic: Grossly normal Incision/Wound: na  Assessment/Plan We recommend release of his trigger and carpal tunnel syndrome right hand with but no treatment of his mild SLAC wrist.   Jason Farmer R 10/12/2013, 9:32 AM

## 2013-10-12 NOTE — Op Note (Signed)
[id number] 

## 2013-10-12 NOTE — Transfer of Care (Signed)
Immediate Anesthesia Transfer of Care Note  Patient: [name]  Procedure(s) Performed: Procedure(s): RIGHT CARPAL TUNNEL RELEASE (Right) RELEASE TRIGGER FINGER/A-1 PULLEY RIGHT MIDDLE FINGER (Right)  Patient Location: PACU  Anesthesia Type:Bier block  Level of Consciousness: awake, sedated and patient cooperative  Airway & Oxygen Therapy: Patient Spontanous Breathing and Patient connected to face mask oxygen  Post-op Assessment: Report given to PACU RN and Post -op Vital signs reviewed and stable  Post vital signs: Reviewed and stable  Complications: No apparent anesthesia complications

## 2013-10-12 NOTE — Anesthesia Preprocedure Evaluation (Addendum)
Anesthesia Evaluation  Patient identified by MRN, date of birth, ID band Patient awake    Reviewed: Allergy & Precautions, H&P , NPO status , Patient's Chart, lab work & pertinent test results, reviewed documented beta blocker date and time   Airway Mallampati: II TM Distance: >3 FB Neck ROM: Full    Dental no notable dental hx. (+) Teeth Intact, Dental Advisory Given   Pulmonary neg pulmonary ROS, former smoker,  breath sounds clear to auscultation  Pulmonary exam normal       Cardiovascular On Home Beta Blockers Rhythm:Regular Rate:Normal     Neuro/Psych PSYCHIATRIC DISORDERS Anxiety negative neurological ROS     GI/Hepatic Neg liver ROS, GERD-  Medicated and Controlled,  Endo/Other  negative endocrine ROS  Renal/GU negative Renal ROS  negative genitourinary   Musculoskeletal   Abdominal   Peds  Hematology negative hematology ROS (+)   Anesthesia Other Findings   Reproductive/Obstetrics negative OB ROS                          Anesthesia Physical Anesthesia Plan  ASA: II  Anesthesia Plan: MAC and Bier Block   Post-op Pain Management:    Induction: Intravenous  Airway Management Planned: Simple Face Mask  Additional Equipment:   Intra-op Plan:   Post-operative Plan:   Informed Consent: I have reviewed the patients History and Physical, chart, labs and discussed the procedure including the risks, benefits and alternatives for the proposed anesthesia with the patient or authorized representative who has indicated his/her understanding and acceptance.   Dental advisory given  Plan Discussed with: CRNA  Anesthesia Plan Comments:         Anesthesia Quick Evaluation

## 2013-10-12 NOTE — Discharge Instructions (Addendum)

## 2013-10-13 ENCOUNTER — Encounter (HOSPITAL_BASED_OUTPATIENT_CLINIC_OR_DEPARTMENT_OTHER): Payer: Self-pay | Admitting: Orthopedic Surgery

## 2013-10-17 DIAGNOSIS — H4011X0 Primary open-angle glaucoma, stage unspecified: Secondary | ICD-10-CM | POA: Diagnosis not present

## 2013-10-25 NOTE — Op Note (Signed)
Dictated number [id number]

## 2013-10-26 NOTE — Op Note (Signed)
NAME:  Jason Farmer, Jason Farmer NO.:  [id number]  MEDICAL RECORD NO.:  [id number]  LOCATION:                                 FACILITY:  PHYSICIAN:  Daryll Brod, M.D.            DATE OF BIRTH:  DATE OF PROCEDURE:  10/20/2013 DATE OF DISCHARGE:                              OPERATIVE REPORT   PREOPERATIVE DIAGNOSES:  Carpal tunnel syndrome, right hand; stenosing tenosynovitis, right middle finger.  POSTOPERATIVE DIAGNOSES:  Carpal tunnel syndrome, right hand; stenosing tenosynovitis, right middle finger.  OPERATION:  Release of A1 pulley, right middle finger; release right carpal canal.  SURGEON:  Daryll Brod, MD  ANESTHESIA:  Axillary block.  HISTORY:  The patient is a 79 year old male with a history of carpal tunnel syndrome, right hand; stenosing tenosynovitis, right middle finger.  This has not responded to conservative treatment.  He also has risks which is not going to be treated.  He has failed conservative treatment for the carpal tunnel and trigger finger, and has elected to undergo release of each.  In the preoperative area, the patient is seen, the extremity marked by both patient and surgeon.  Antibiotic given.  He is aware that there is no guarantee with surgery, possibility of infection, recurrence of injury to arteries, nerves, tendons, incomplete relief of symptoms, and dystrophy.  PROCEDURE:  This is a re-dictation of the original dictation as following.  There may be significant differences due to the interval of time between the original surgery, original dictation and this dictation.  The patient was brought to the operating room, where an anesthetic was carried out without difficulty.  He was prepped using ChloraPrep, supine position, right arm free.  A 3-minute dry time was allowed.  Time-out taken, confirming the patient and procedure.  The middle finger was attended to first.  The limb had been exsanguinated with an Esmarch bandage.   Tourniquet placed high and the arm inflated to 250 mmHg.  An oblique incision was made over the A1 pulley of the right middle finger and carried down through subcutaneous tissue.  Bleeders were electrocauterized with bipolar.  Dissection carried down to the A1 pulley.  Retractors placed protecting neurovascular bundles radially and ulnarly.  An incision was then made on the radial aspect of the A1 pulley, and a small incision made centrally in A2.  A partial tenosynovectomy was performed proximally.  The finger was placed through a full range motion, no further triggering was noted.  The wound was irrigated and closed with interrupted 4-0 Vicryl Rapide sutures.  A separate incision was then made longitudinally in the right palm, carried down through subcutaneous tissue.  Bleeders were electrocauterized with bipolar.  Palmar fascia was split.  Superficial palmar arch identified.  The flexor tendon to the ring and little finger identified.  To the ulnar side of the median nerve the carpal retinaculum was incised with sharp dissection.  Right angle and Sewall retractor were placed between skin and forearm fascia.  The fascia was released for approximately a centimeter and half to 2 cm under direct vision.  Canal was explored.  The nerve visualized.  A motor branch was noted  to enter into muscle.  The wound was irrigated.  The skin closed with interrupted 4-0 Vicryl Rapide sutures.  Sterile compressive dressing was applied with the fingers free.  On deflation of the tourniquet, all fingers immediately pinked.  He was taken to the recovery room for observation in satisfactory condition.  He will be discharged home to return in 1 week on Norco.          ______________________________ Daryll Brod, M.D.     GK/MEDQ  D:  10/25/2013  T:  10/26/2013  Job:  [id number]

## 2013-11-14 DIAGNOSIS — H40059 Ocular hypertension, unspecified eye: Secondary | ICD-10-CM | POA: Diagnosis not present

## 2013-12-06 DIAGNOSIS — K219 Gastro-esophageal reflux disease without esophagitis: Secondary | ICD-10-CM | POA: Diagnosis not present

## 2013-12-06 DIAGNOSIS — L509 Urticaria, unspecified: Secondary | ICD-10-CM | POA: Diagnosis not present

## 2013-12-06 DIAGNOSIS — J3081 Allergic rhinitis due to animal (cat) (dog) hair and dander: Secondary | ICD-10-CM | POA: Diagnosis not present

## 2013-12-06 DIAGNOSIS — H81319 Aural vertigo, unspecified ear: Secondary | ICD-10-CM | POA: Diagnosis not present

## 2013-12-15 DIAGNOSIS — H4011X0 Primary open-angle glaucoma, stage unspecified: Secondary | ICD-10-CM | POA: Diagnosis not present

## 2013-12-19 DIAGNOSIS — K219 Gastro-esophageal reflux disease without esophagitis: Secondary | ICD-10-CM | POA: Diagnosis not present

## 2013-12-19 DIAGNOSIS — J3081 Allergic rhinitis due to animal (cat) (dog) hair and dander: Secondary | ICD-10-CM | POA: Diagnosis not present

## 2013-12-19 DIAGNOSIS — L509 Urticaria, unspecified: Secondary | ICD-10-CM | POA: Diagnosis not present

## 2013-12-19 DIAGNOSIS — H81319 Aural vertigo, unspecified ear: Secondary | ICD-10-CM | POA: Diagnosis not present

## 2014-02-21 DIAGNOSIS — H4011X0 Primary open-angle glaucoma, stage unspecified: Secondary | ICD-10-CM | POA: Diagnosis not present

## 2014-03-14 ENCOUNTER — Ambulatory Visit (INDEPENDENT_AMBULATORY_CARE_PROVIDER_SITE_OTHER): Payer: Medicare Other | Admitting: Neurology

## 2014-03-14 ENCOUNTER — Encounter: Payer: Self-pay | Admitting: Neurology

## 2014-03-14 VITALS — BP 142/84 | HR 82 | Temp 97.7°F | Resp 16 | Ht 68.0 in | Wt 158.0 lb

## 2014-03-14 DIAGNOSIS — R252 Cramp and spasm: Secondary | ICD-10-CM | POA: Diagnosis not present

## 2014-03-14 NOTE — Patient Instructions (Signed)
Call if you would like to repeat testing or try a medication such as gabapentin or carbamazepine.

## 2014-03-14 NOTE — Progress Notes (Signed)
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 He is back on the Crestor. Over the last couple months, he has noticed improvement. He now gets cramping at night approximately every 2 weeks.  HISTORY: He has had intermittent leg cramps for 20 years ago.  He describes severe cramping pain in his thighs and hamstrings.  It can last minutes to up to 3 hours.  It occurs spontaneously, whether he is laying in bed or walking around.  Nothing really triggers it, but he often notes it is worse after a game of golf or afternoon of fly fishing.  However, he is very active and walks 3 to 5 miles a day, and usually activity doesn't trigger it.  There is nothing that relieves it, such as movement.  There is no associated numbness, tingling, burning pain, back pain radiating down the legs, bowel or bladder incontinence, or weakness.  It occurs sporadically.  He can go a month without incident and then have six days straight of pain.  Over the years, he has tried several therapies.  Quinine helped before it was taken off the market.  He tried magnesium and potassium supplements, which were ineffective.  Tonic water didn't help.  Symptoms seemed to have correlated with initiation of statin therapy.  His medication has been changed over the years, but still to a different statin. About 2 or 3 years ago, he had a workup, including EMG and CK, which were reportedly unremarkable.  PAST MEDICAL HISTORY: Past Medical History  Diagnosis Date  . GERD (gastroesophageal reflux disease)   . Seasonal allergies   . Arthritis   . Wears glasses     . Hyperlipemia   . HOH (hard of hearing)   . Wears hearing aid   . Anxiety     MEDICATIONS: Current Outpatient Prescriptions on File Prior to Visit  Medication Sig Dispense Refill  . cetirizine (ZYRTEC) 10 MG tablet Take 10 mg by mouth every evening.      . fexofenadine (ALLEGRA) 180 MG tablet Take 180 mg by mouth daily.      Marland Kitchen latanoprost (XALATAN) 0.005 % ophthalmic solution 1 drop at bedtime.      Marland Kitchen omeprazole (PRILOSEC) 40 MG capsule Take 1 capsule (40 mg total) by mouth daily.  100 capsule  3  . rosuvastatin (CRESTOR) 10 MG tablet Take 1 tablet (10 mg total) by mouth daily.  100 tablet  3  . LORazepam (ATIVAN) 0.5 MG tablet One half to one tablet each bedtime when necessary  100 tablet  3   No current facility-administered medications on file prior to visit.    ALLERGIES: No Known Allergies  FAMILY HISTORY: Family History  Problem Relation Age of Onset  . Mental retardation Father     SOCIAL HISTORY: History   Social History  . Marital Status: Married    Spouse Name: N/A    Number of Children: N/A  . Years of Education: N/A   Occupational History  . Not on file.   Social History Main Topics  . Smoking status: Former Smoker    Quit date: 06/03/1984  . Smokeless tobacco: Former  User    Quit date: 10/07/1986  . Alcohol Use: No  . Drug Use: No  . Sexual Activity: Not on file   Other Topics Concern  . Not on file   Social History Narrative  . No narrative on file    REVIEW OF SYSTEMS: Constitutional: No fevers, chills, or sweats, no generalized fatigue, change in appetite Eyes: No visual changes, double vision, eye pain Ear, nose and throat: No hearing loss, ear pain, nasal congestion, sore throat Cardiovascular: No chest pain, palpitations Respiratory:  No shortness of breath at rest or with exertion, wheezes GastrointestinaI: No nausea, vomiting, diarrhea, abdominal pain, fecal incontinence Genitourinary:  No dysuria, urinary retention or  frequency Musculoskeletal:  No neck pain, back pain Integumentary: No rash, pruritus, skin lesions Neurological: as above Psychiatric: No depression, insomnia, anxiety Endocrine: No palpitations, fatigue, diaphoresis, mood swings, change in appetite, change in weight, increased thirst Hematologic/Lymphatic:  No anemia, purpura, petechiae. Allergic/Immunologic: no itchy/runny eyes, nasal congestion, recent allergic reactions, rashes  PHYSICAL EXAM: Filed Vitals:   03/14/14 0912  BP: 142/84  Pulse: 82  Temp: 97.7 F (36.5 C)  Resp: 16   General: No acute distress Head:  Normocephalic/atraumatic Neck: supple, no paraspinal tenderness, full range of motion Heart:  Regular rate and rhythm Lungs:  Clear to auscultation bilaterally Back: No paraspinal tenderness Neurological Exam: alert and oriented to person, place, and time. Attention span and concentration intact, recent and remote memory intact, fund of knowledge intact.  Speech fluent and not dysarthric, language intact.  CN II-XII intact. Fundoscopic exam unremarkable without vessel changes, exudates, hemorrhages or papilledema.  Bulk and tone normal, muscle strength 5/5 throughout.  Sensation to light touch, temperature and vibration intact.  Deep tendon reflexes 2+ throughout, toes downgoing.  Finger to nose and heel to shin testing intact.  Gait normal  IMPRESSION: Muscle cramps.  We could repeat testing, such as checking CK or performing EMG, but I would suspect it would unlikely be changed in the last 3 years considering he's had this for over 2 decades. I suspect this would require symptomatic treatment, such as with gabapentin or carbamazepine. At this time, he doesn't feel he needs to add any new medications. If he wants to pursue repeat testing or to initiate a medication, he will call the office.  15 minutes spent with the patient, over 50% spent discussing etiology and possible treatment options.  Metta Clines, DO  CC:  Stevie Kern, MD

## 2014-05-24 DIAGNOSIS — T1510XA Foreign body in conjunctival sac, unspecified eye, initial encounter: Secondary | ICD-10-CM | POA: Diagnosis not present

## 2014-05-24 DIAGNOSIS — H4011X0 Primary open-angle glaucoma, stage unspecified: Secondary | ICD-10-CM | POA: Diagnosis not present

## 2014-05-29 ENCOUNTER — Ambulatory Visit (INDEPENDENT_AMBULATORY_CARE_PROVIDER_SITE_OTHER): Payer: Medicare Other | Admitting: *Deleted

## 2014-05-29 DIAGNOSIS — Z23 Encounter for immunization: Secondary | ICD-10-CM

## 2014-06-26 DIAGNOSIS — M65351 Trigger finger, right little finger: Secondary | ICD-10-CM | POA: Diagnosis not present

## 2014-06-29 DIAGNOSIS — M7712 Lateral epicondylitis, left elbow: Secondary | ICD-10-CM | POA: Diagnosis not present

## 2014-07-08 DIAGNOSIS — M19022 Primary osteoarthritis, left elbow: Secondary | ICD-10-CM | POA: Diagnosis not present

## 2014-07-10 DIAGNOSIS — M7712 Lateral epicondylitis, left elbow: Secondary | ICD-10-CM | POA: Diagnosis not present

## 2014-07-18 DIAGNOSIS — M7712 Lateral epicondylitis, left elbow: Secondary | ICD-10-CM | POA: Diagnosis not present

## 2014-07-18 DIAGNOSIS — M25522 Pain in left elbow: Secondary | ICD-10-CM | POA: Diagnosis not present

## 2014-07-18 DIAGNOSIS — M6281 Muscle weakness (generalized): Secondary | ICD-10-CM | POA: Diagnosis not present

## 2014-07-24 DIAGNOSIS — M65342 Trigger finger, left ring finger: Secondary | ICD-10-CM | POA: Diagnosis not present

## 2014-07-28 DIAGNOSIS — M6281 Muscle weakness (generalized): Secondary | ICD-10-CM | POA: Diagnosis not present

## 2014-07-28 DIAGNOSIS — M25522 Pain in left elbow: Secondary | ICD-10-CM | POA: Diagnosis not present

## 2014-07-28 DIAGNOSIS — M7712 Lateral epicondylitis, left elbow: Secondary | ICD-10-CM | POA: Diagnosis not present

## 2014-08-02 DIAGNOSIS — M7712 Lateral epicondylitis, left elbow: Secondary | ICD-10-CM | POA: Diagnosis not present

## 2014-08-02 DIAGNOSIS — M25522 Pain in left elbow: Secondary | ICD-10-CM | POA: Diagnosis not present

## 2014-08-02 DIAGNOSIS — M6281 Muscle weakness (generalized): Secondary | ICD-10-CM | POA: Diagnosis not present

## 2014-08-11 DIAGNOSIS — M25522 Pain in left elbow: Secondary | ICD-10-CM | POA: Diagnosis not present

## 2014-08-11 DIAGNOSIS — M65342 Trigger finger, left ring finger: Secondary | ICD-10-CM | POA: Diagnosis not present

## 2014-08-11 DIAGNOSIS — M65351 Trigger finger, right little finger: Secondary | ICD-10-CM | POA: Diagnosis not present

## 2014-08-11 DIAGNOSIS — M6281 Muscle weakness (generalized): Secondary | ICD-10-CM | POA: Diagnosis not present

## 2014-08-11 DIAGNOSIS — M7712 Lateral epicondylitis, left elbow: Secondary | ICD-10-CM | POA: Diagnosis not present

## 2014-08-29 DIAGNOSIS — H4011X1 Primary open-angle glaucoma, mild stage: Secondary | ICD-10-CM | POA: Diagnosis not present

## 2014-09-04 ENCOUNTER — Other Ambulatory Visit (INDEPENDENT_AMBULATORY_CARE_PROVIDER_SITE_OTHER): Payer: Medicare Other

## 2014-09-04 DIAGNOSIS — E785 Hyperlipidemia, unspecified: Secondary | ICD-10-CM

## 2014-09-04 DIAGNOSIS — R300 Dysuria: Secondary | ICD-10-CM

## 2014-09-04 DIAGNOSIS — E039 Hypothyroidism, unspecified: Secondary | ICD-10-CM | POA: Diagnosis not present

## 2014-09-04 DIAGNOSIS — I10 Essential (primary) hypertension: Secondary | ICD-10-CM

## 2014-09-04 DIAGNOSIS — N400 Enlarged prostate without lower urinary tract symptoms: Secondary | ICD-10-CM

## 2014-09-04 DIAGNOSIS — D649 Anemia, unspecified: Secondary | ICD-10-CM | POA: Diagnosis not present

## 2014-09-04 LAB — LIPID PANEL
Cholesterol: 205 mg/dL — ABNORMAL HIGH (ref 0–200)
HDL: 45.2 mg/dL (ref >39.00)
LDL Cholesterol: 138 mg/dL — ABNORMAL HIGH (ref 0–99)
NonHDL: 159.8
Total CHOL/HDL Ratio: 5
Triglycerides: 108 mg/dL (ref 0.0–149.0)
VLDL: 21.6 mg/dL (ref 0.0–40.0)

## 2014-09-04 LAB — POCT URINALYSIS DIPSTICK
Bilirubin, UA: NEGATIVE
Glucose, UA: NEGATIVE
Ketones, UA: NEGATIVE
Leukocytes, UA: NEGATIVE
Nitrite, UA: NEGATIVE
Spec Grav, UA: 1.015
Urobilinogen, UA: 0.2
pH, UA: 5.5

## 2014-09-04 LAB — HEPATIC FUNCTION PANEL
ALT: 19 U/L (ref 0–53)
AST: 17 U/L (ref 0–37)
Albumin: 3.9 g/dL (ref 3.5–5.2)
Alkaline Phosphatase: 57 U/L (ref 39–117)
Bilirubin, Direct: 0.1 mg/dL (ref 0.0–0.3)
Total Bilirubin: 0.7 mg/dL (ref 0.2–1.2)
Total Protein: 6.5 g/dL (ref 6.0–8.3)

## 2014-09-04 LAB — BASIC METABOLIC PANEL
BUN: 21 mg/dL (ref 6–23)
CO2: 28 mEq/L (ref 19–32)
Calcium: 9.3 mg/dL (ref 8.4–10.5)
Chloride: 105 mEq/L (ref 96–112)
Creatinine, Ser: 1.2 mg/dL (ref 0.4–1.5)
GFR: 62.94 mL/min (ref >60.00)
Glucose, Bld: 96 mg/dL (ref 70–99)
Potassium: 5.2 mEq/L — ABNORMAL HIGH (ref 3.5–5.1)
Sodium: 140 mEq/L (ref 135–145)

## 2014-09-04 LAB — CBC WITH DIFFERENTIAL/PLATELET
Basophils Absolute: 0 10*3/uL (ref 0.0–0.1)
Basophils Relative: 0.4 % (ref 0.0–3.0)
Eosinophils Absolute: 0.1 10*3/uL (ref 0.0–0.7)
Eosinophils Relative: 1.5 % (ref 0.0–5.0)
HCT: 45.1 % (ref 39.0–52.0)
Hemoglobin: 14.8 g/dL (ref 13.0–17.0)
Lymphocytes Relative: 32.7 % (ref 12.0–46.0)
Lymphs Abs: 2.4 10*3/uL (ref 0.7–4.0)
MCHC: 32.7 g/dL (ref 30.0–36.0)
MCV: 91.3 fl (ref 78.0–100.0)
Monocytes Absolute: 0.6 10*3/uL (ref 0.1–1.0)
Monocytes Relative: 8.5 % (ref 3.0–12.0)
Neutro Abs: 4.1 10*3/uL (ref 1.4–7.7)
Neutrophils Relative %: 56.9 % (ref 43.0–77.0)
Platelets: 253 10*3/uL (ref 150.0–400.0)
RBC: 4.94 Mil/uL (ref 4.22–5.81)
RDW: 13.7 % (ref 11.5–15.5)
WBC: 7.3 10*3/uL (ref 4.0–10.5)

## 2014-09-04 LAB — PSA: PSA: 3.86 ng/mL (ref 0.10–4.00)

## 2014-09-04 LAB — TSH: TSH: 1.86 u[IU]/mL (ref 0.35–4.50)

## 2014-09-11 ENCOUNTER — Encounter: Payer: Medicare Other | Admitting: Family Medicine

## 2014-09-12 ENCOUNTER — Ambulatory Visit (INDEPENDENT_AMBULATORY_CARE_PROVIDER_SITE_OTHER): Payer: Medicare Other | Admitting: Family Medicine

## 2014-09-12 ENCOUNTER — Encounter: Payer: Self-pay | Admitting: Family Medicine

## 2014-09-12 VITALS — BP 120/88 | Temp 98.0°F | Ht 68.0 in | Wt 170.0 lb

## 2014-09-12 DIAGNOSIS — J302 Other seasonal allergic rhinitis: Secondary | ICD-10-CM

## 2014-09-12 DIAGNOSIS — K219 Gastro-esophageal reflux disease without esophagitis: Secondary | ICD-10-CM | POA: Diagnosis not present

## 2014-09-12 DIAGNOSIS — E785 Hyperlipidemia, unspecified: Secondary | ICD-10-CM

## 2014-09-12 DIAGNOSIS — M25571 Pain in right ankle and joints of right foot: Secondary | ICD-10-CM | POA: Diagnosis not present

## 2014-09-12 DIAGNOSIS — Z0000 Encounter for general adult medical examination without abnormal findings: Secondary | ICD-10-CM

## 2014-09-12 DIAGNOSIS — F411 Generalized anxiety disorder: Secondary | ICD-10-CM | POA: Diagnosis not present

## 2014-09-12 LAB — URIC ACID: Uric Acid, Serum: 5.3 mg/dL (ref 4.0–7.8)

## 2014-09-12 MED ORDER — OMEPRAZOLE 40 MG PO CPDR: 40.0000 mg | DELAYED_RELEASE_CAPSULE | Freq: Every day | ORAL | Status: DC

## 2014-09-12 MED ORDER — ROSUVASTATIN CALCIUM 10 MG PO TABS: 10.0000 mg | ORAL_TABLET | Freq: Every day | ORAL | Status: DC

## 2014-09-12 MED ORDER — LORAZEPAM 0.5 MG PO TABS: ORAL_TABLET | ORAL | Status: DC

## 2014-09-12 NOTE — Progress Notes (Signed)
Pre visit review using our clinic review tool, if applicable. No additional management support is needed unless otherwise documented below in the visit note. 

## 2014-09-12 NOTE — Progress Notes (Signed)
Subjective:    Patient ID: Jason Farmer, male    DOB: [birth year], 80 y.o.   MRN: [id number]  HPI  Jason Farmer is a 80 year old married male nonsmoker who comes in today for general physical examination because of a history of allergic rhinitis, occasional sleep dysfunction, reflux esophagitis, hyperlipidemia  He gets routine eye care and is on eyedrops because of a history of glaucoma, regular dental care, colonoscopy due 2017. He has a history of colon polyps,  Cognitive function normal he walks daily home health safety reviewed no issues identified, no guns in the house, he does have a healthcare power of attorney and living well  Vaccinations updated last tetanus booster January 2009 other vaccinations up-to-date  A new problem of pain and swelling and redness proximal joint right great toe. He's only had one episode in his lifetime. Does not drink any alcohol.  He also goes to the [place]   Review of Systems  Constitutional: Negative.   HENT: Negative.   Eyes: Negative.   Respiratory: Negative.   Cardiovascular: Negative.   Gastrointestinal: Negative.   Endocrine: Negative.   Genitourinary: Negative.   Musculoskeletal: Negative.   Skin: Negative.   Allergic/Immunologic: Negative.   Neurological: Negative.   Hematological: Negative.   Psychiatric/Behavioral: Negative.        Objective:   Physical Exam  Constitutional: He is oriented to person, place, and time. He appears well-developed and well-nourished.  HENT:  Head: Normocephalic and atraumatic.  Right Ear: External ear normal.  Left Ear: External ear normal.  Nose: Nose normal.  Mouth/Throat: Oropharynx is clear and moist.  Eyes: Conjunctivae and EOM are normal. Pupils are equal, round, and reactive to light.  Neck: Normal range of motion. Neck supple. No JVD present. No tracheal deviation present. No thyromegaly present.  Cardiovascular: Normal rate, regular rhythm, normal heart sounds and intact distal pulses.  Exam  reveals no gallop and no friction rub.   No murmur heard. No carotid nor aortic bruits peripheral pulses 2+ and symmetrical  Pulmonary/Chest: Effort normal and breath sounds normal. No stridor. No respiratory distress. He has no wheezes. He has no rales. He exhibits no tenderness.  Abdominal: Soft. Bowel sounds are normal. He exhibits no distension and no mass. There is no tenderness. There is no rebound and no guarding.  Genitourinary: Rectum normal and penis normal. Guaiac negative stool. No penile tenderness.  2+ symmetrical nonnodular BPH  Musculoskeletal: Normal range of motion. He exhibits no edema or tenderness.  Tenderness proximal joint right great toe. He said he an episode where he got red-hot swollen. It does not drink any alcohol. No family history of gout  Lymphadenopathy:    He has no cervical adenopathy.  Neurological: He is alert and oriented to person, place, and time. He has normal reflexes. No cranial nerve deficit. He exhibits normal muscle tone.  Skin: Skin is warm and dry. No rash noted. No erythema. No pallor.  Total body skin exam normal  Psychiatric: He has a normal mood and affect. His behavior is normal. Judgment and thought content normal.  Nursing note and vitals reviewed.         Assessment & Plan:  Healthy male  Hyperlipidemia at goal continue Crestor 10 mg daily and an aspirin tablet  History of mild sleep dysfunction Ativan 0.5 one half tab to 1 tablet at bedtime when necessary  Reflux esophagitis treated episodically with Prilosec 40 mg when necessary  History of glaucoma continue eyedrops and followed by ophthalmologist  Allergic rhinitis continue Claritin  New problem of pain and swelling of right great toe........ check uric acid level

## 2014-09-12 NOTE — Patient Instructions (Signed)
Continue current medications  Follow-up in one year sooner if any problems  We will check a uric acid level today to rule out gout

## 2014-12-07 ENCOUNTER — Encounter: Payer: Self-pay | Admitting: Family Medicine

## 2014-12-07 ENCOUNTER — Telehealth: Payer: Self-pay | Admitting: *Deleted

## 2014-12-07 ENCOUNTER — Ambulatory Visit (INDEPENDENT_AMBULATORY_CARE_PROVIDER_SITE_OTHER): Payer: Medicare Other | Admitting: Family Medicine

## 2014-12-07 VITALS — BP 160/80 | Temp 98.8°F | Wt 170.0 lb

## 2014-12-07 DIAGNOSIS — I10 Essential (primary) hypertension: Secondary | ICD-10-CM | POA: Diagnosis not present

## 2014-12-07 DIAGNOSIS — J3081 Allergic rhinitis due to animal (cat) (dog) hair and dander: Secondary | ICD-10-CM | POA: Diagnosis not present

## 2014-12-07 DIAGNOSIS — R42 Dizziness and giddiness: Secondary | ICD-10-CM | POA: Diagnosis not present

## 2014-12-07 DIAGNOSIS — K219 Gastro-esophageal reflux disease without esophagitis: Secondary | ICD-10-CM | POA: Diagnosis not present

## 2014-12-07 MED ORDER — ATENOLOL 25 MG PO TABS: 25.0000 mg | ORAL_TABLET | Freq: Every day | ORAL | Status: DC

## 2014-12-07 NOTE — Progress Notes (Signed)
Pre visit review using our clinic review tool, if applicable. No additional management support is needed unless otherwise documented below in the visit note. 

## 2014-12-07 NOTE — Telephone Encounter (Signed)
10:30 Pt presented to office c/o elevated blood pressure was at the allergist and his blood pressure was 160/100 and they sent him directly here. Pt denies any headaches, states has been checking blood pressure at home and since Feb has been elevated running 150-155 over 90. Pt stated has been stressed and also jittery the past 4-5 days. Pt also has mild dizziness everyday for the past year that is why he went to allergist. Bp 156/98, pulse 91, RR 20. Discussed with Apolonio Schneiders and she said to bring pt in at the end of the day to see Dr.Todd. Told pt Dr. Sherren Mocha would like to see him today at 4:15 pm. Pt verbalized understanding and was added to schedule.

## 2014-12-07 NOTE — Progress Notes (Signed)
   Subjective:    Patient ID: Jason Farmer, male    DOB: 1934/10/14, 80 y.o.   MRN: [id number]  HPI Jason Farmer is a 80 year old male who comes in today for evaluation of elevated blood pressure. His blood pressures always been normal. Couple months ago he had a physical at the [place] and was normal. He's been monitoring his blood pressure at home and is gone up to 160/90 in his stay that way. BP here today 160/100.  Genetically his son and father have high blood pressure   Review of Systems    review is review of systems otherwise negative Objective:   Physical Exam Well-developed well-nourished male no acute distress vital signs stable he is afebrile BP right in sitting position 160/90       Assessment & Plan:  Hypertension Set,,,,,,,,,,, start with a beta blocker since he's been on that the past for benign tremor and had no major side effects BP check daily follow-up in one month,

## 2014-12-07 NOTE — Patient Instructions (Signed)
Tenormin 25 mg...Marland KitchenMarland KitchenMarland Kitchen 1 daily in the morning  Blood pressure check daily about an hour or 2 after you take your medication  Return in 4 weeks for follow-up with the data and the device  If you wish to purchase a new machine I would recommend the Omron pump up digital blood pressure cuff,,,,,,,,, [employer]

## 2014-12-08 DIAGNOSIS — H4011X1 Primary open-angle glaucoma, mild stage: Secondary | ICD-10-CM | POA: Diagnosis not present

## 2015-01-16 ENCOUNTER — Ambulatory Visit: Payer: Medicare Other | Admitting: Family Medicine

## 2015-01-16 ENCOUNTER — Telehealth: Payer: Self-pay | Admitting: Family Medicine

## 2015-01-16 DIAGNOSIS — I10 Essential (primary) hypertension: Secondary | ICD-10-CM

## 2015-01-16 DIAGNOSIS — R351 Nocturia: Secondary | ICD-10-CM

## 2015-01-16 DIAGNOSIS — E785 Hyperlipidemia, unspecified: Secondary | ICD-10-CM

## 2015-01-16 DIAGNOSIS — K219 Gastro-esophageal reflux disease without esophagitis: Secondary | ICD-10-CM

## 2015-01-16 NOTE — Telephone Encounter (Signed)
Labs ordered.

## 2015-01-16 NOTE — Telephone Encounter (Signed)
Pt has been sch

## 2015-01-16 NOTE — Telephone Encounter (Signed)
Pt has sch for cpx on 09-17-2015. Pt has had cpx labs in advance. Please put order in system and I will call pt to sch?

## 2015-02-05 DIAGNOSIS — M7632 Iliotibial band syndrome, left leg: Secondary | ICD-10-CM | POA: Diagnosis not present

## 2015-02-15 ENCOUNTER — Encounter: Payer: Self-pay | Admitting: Family Medicine

## 2015-02-15 ENCOUNTER — Ambulatory Visit (INDEPENDENT_AMBULATORY_CARE_PROVIDER_SITE_OTHER): Payer: Medicare Other | Admitting: Family Medicine

## 2015-02-15 VITALS — BP 130/80 | HR 68 | Temp 98.2°F | Wt 165.0 lb

## 2015-02-15 DIAGNOSIS — M5416 Radiculopathy, lumbar region: Secondary | ICD-10-CM

## 2015-02-15 MED ORDER — GABAPENTIN 300 MG PO CAPS: 300.0000 mg | ORAL_CAPSULE | Freq: Every day | ORAL | Status: DC

## 2015-02-15 NOTE — Patient Instructions (Signed)
Start Gabapentin 300 mg one at night. After 3 days if pain not improved may increase to one twice a day. We will call you regarding MRI scan of back

## 2015-02-15 NOTE — Progress Notes (Signed)
Subjective:    Patient ID: Jason Farmer, male    DOB: [birth year], 80 y.o.   MRN: [id number]  HPI Patient seen with a least 10 day history of pain left hip area radiating down to the left lower extremity mostly anterior thigh. He went to orthopedist apparently with question of sciatica versus IT band syndrome. He was placed on prednisone which has not helped. Also has taken some meloxicam. His pain is very severe at times. Somewhat of achy and sometimes sharp quality. Interfering with sleep at night. Pain occasionally radiates occasionally below the knee. He had reportedly had x-rays of his left hip which were unremarkable. He denies any lower extremity numbness. Occasional left lumbar back pain. No recent fevers or chills. No appetite or weight changes.  Past Medical History  Diagnosis Date  . GERD (gastroesophageal reflux disease)   . Seasonal allergies   . Arthritis   . Wears glasses   . Hyperlipemia   . HOH (hard of hearing)   . Wears hearing aid   . Anxiety   . Essential hypertension, benign 12/07/2014   Past Surgical History  Procedure Laterality Date  . Eye surgery      both cataracts  . Cervical fusion  2007  . Knee arthroscopy      left  . Appendectomy    . Tonsillectomy    . Hemorrhoid surgery    . Colonoscopy    . Upper gastrointestinal endoscopy    . Trigger finger release  06/08/2012    Procedure: RELEASE TRIGGER FINGER/A-1 PULLEY;  Surgeon: Wynonia Sours, MD;  Location: [facility];  Service: Orthopedics;  Laterality: Left;  Release A-1 Pulley Left Long Finger  . Trigger finger release  07/14/2012    Procedure: RELEASE TRIGGER FINGER/A-1 PULLEY;  Surgeon: Wynonia Sours, MD;  Location: [facility];  Service: Orthopedics;  Laterality: Right;  . Nasal septoplasty w/ turbinoplasty Bilateral 02/15/2013    Procedure: NASAL SEPTOPLASTY WITH BILATER TURBINATE REDUCTION;  Surgeon: Ascencion Dike, MD;  Location: [facility];  Service: ENT;   Laterality: Bilateral;  . Carpal tunnel release Right 10/12/2013    Procedure: RIGHT CARPAL TUNNEL RELEASE;  Surgeon: Wynonia Sours, MD;  Location: [facility];  Service: Orthopedics;  Laterality: Right;  . Trigger finger release Right 10/12/2013    Procedure: RELEASE TRIGGER FINGER/A-1 PULLEY RIGHT MIDDLE FINGER;  Surgeon: Wynonia Sours, MD;  Location: [facility];  Service: Orthopedics;  Laterality: Right;    reports that he quit smoking about 30 years ago. He quit smokeless tobacco use about 28 years ago. He reports that he does not drink alcohol or use illicit drugs. family history includes Mental retardation in his father. No Known Allergies'    Review of Systems  Constitutional: Negative for fever, chills, appetite change and unexpected weight change.  Respiratory: Negative for shortness of breath.   Cardiovascular: Negative for chest pain.  Gastrointestinal: Negative for abdominal pain.  Genitourinary: Negative for dysuria.  Musculoskeletal: Positive for back pain.  Skin: Negative for rash.  Neurological: Negative for weakness.  Hematological: Negative for adenopathy.       Objective:   Physical Exam  Constitutional: He appears well-developed and well-nourished. No distress.  Cardiovascular: Normal rate and regular rhythm.   Pulmonary/Chest: Effort normal and breath sounds normal. No respiratory distress. He has no wheezes. He has no rales.  Musculoskeletal:  Straight leg raises are negative bilaterally. No lower extremity edema. No  obvious muscle atrophy  Neurological:  Patient does have diminished left knee reflex compared to right. Ankle reflexes are symmetric. He has some decreased strength with left knee extension compared to the right side. Full-strength with plantar flexion dorsiflexion bilaterally. Normal sensory function to touch          Assessment & Plan:  Left lower extremity pain. Suspect lumbar nerve root impingement, likely  L3-L4. He has neurologic findings as above but is ambulating without difficulty. He has not had any improvement with non-steroidal's or prednisone. His pain is severe at times. Start gabapentin 300 mg daily at bedtime after few days titrated 1 twice a day. Obtain MRI lumbar spine to further assess-especially with neurologic findings above

## 2015-02-15 NOTE — Progress Notes (Signed)
Pre visit review using our clinic review tool, if applicable. No additional management support is needed unless otherwise documented below in the visit note. 

## 2015-02-20 ENCOUNTER — Ambulatory Visit
Admission: RE | Admit: 2015-02-20 | Discharge: 2015-02-20 | Disposition: A | Discharge status: Home / Self Care | Payer: Medicare Other | Source: Ambulatory Visit | Attending: Family Medicine | Admitting: Family Medicine

## 2015-02-20 ENCOUNTER — Other Ambulatory Visit: Payer: Medicare Other

## 2015-02-20 DIAGNOSIS — M47817 Spondylosis without myelopathy or radiculopathy, lumbosacral region: Secondary | ICD-10-CM | POA: Diagnosis not present

## 2015-02-20 DIAGNOSIS — M5127 Other intervertebral disc displacement, lumbosacral region: Secondary | ICD-10-CM | POA: Diagnosis not present

## 2015-02-20 DIAGNOSIS — M5416 Radiculopathy, lumbar region: Secondary | ICD-10-CM

## 2015-02-25 ENCOUNTER — Other Ambulatory Visit: Payer: Medicare Other

## 2015-02-27 ENCOUNTER — Telehealth: Payer: Self-pay | Admitting: Family Medicine

## 2015-02-27 ENCOUNTER — Other Ambulatory Visit: Payer: Self-pay | Admitting: Family Medicine

## 2015-02-27 DIAGNOSIS — M5416 Radiculopathy, lumbar region: Secondary | ICD-10-CM

## 2015-02-27 NOTE — Telephone Encounter (Signed)
Pt would like a call back about MRI results

## 2015-02-27 NOTE — Telephone Encounter (Signed)
Pt informed

## 2015-03-16 DIAGNOSIS — M47896 Other spondylosis, lumbar region: Secondary | ICD-10-CM | POA: Diagnosis not present

## 2015-03-16 DIAGNOSIS — M5416 Radiculopathy, lumbar region: Secondary | ICD-10-CM | POA: Diagnosis not present

## 2015-04-03 DIAGNOSIS — M5416 Radiculopathy, lumbar region: Secondary | ICD-10-CM | POA: Diagnosis not present

## 2015-04-16 DIAGNOSIS — M5416 Radiculopathy, lumbar region: Secondary | ICD-10-CM | POA: Diagnosis not present

## 2015-04-23 DIAGNOSIS — M5416 Radiculopathy, lumbar region: Secondary | ICD-10-CM | POA: Diagnosis not present

## 2015-04-24 ENCOUNTER — Emergency Department (HOSPITAL_COMMUNITY)
Admission: EM | Admit: 2015-04-24 | Discharge: 2015-04-24 | Disposition: A | Discharge status: Home / Self Care | Payer: Medicare Other | Attending: Emergency Medicine | Admitting: Emergency Medicine

## 2015-04-24 ENCOUNTER — Encounter (HOSPITAL_COMMUNITY): Payer: Self-pay

## 2015-04-24 ENCOUNTER — Emergency Department (HOSPITAL_COMMUNITY): Payer: Medicare Other

## 2015-04-24 DIAGNOSIS — F4489 Other dissociative and conversion disorders: Secondary | ICD-10-CM | POA: Diagnosis not present

## 2015-04-24 DIAGNOSIS — R4182 Altered mental status, unspecified: Secondary | ICD-10-CM | POA: Diagnosis present

## 2015-04-24 DIAGNOSIS — Z79899 Other long term (current) drug therapy: Secondary | ICD-10-CM | POA: Diagnosis not present

## 2015-04-24 DIAGNOSIS — E785 Hyperlipidemia, unspecified: Secondary | ICD-10-CM | POA: Diagnosis not present

## 2015-04-24 DIAGNOSIS — K219 Gastro-esophageal reflux disease without esophagitis: Secondary | ICD-10-CM | POA: Insufficient documentation

## 2015-04-24 DIAGNOSIS — F419 Anxiety disorder, unspecified: Secondary | ICD-10-CM | POA: Insufficient documentation

## 2015-04-24 DIAGNOSIS — I10 Essential (primary) hypertension: Secondary | ICD-10-CM | POA: Diagnosis not present

## 2015-04-24 DIAGNOSIS — M1990 Unspecified osteoarthritis, unspecified site: Secondary | ICD-10-CM | POA: Diagnosis not present

## 2015-04-24 DIAGNOSIS — I6789 Other cerebrovascular disease: Secondary | ICD-10-CM | POA: Diagnosis not present

## 2015-04-24 DIAGNOSIS — Z87891 Personal history of nicotine dependence: Secondary | ICD-10-CM | POA: Insufficient documentation

## 2015-04-24 DIAGNOSIS — R4701 Aphasia: Secondary | ICD-10-CM | POA: Diagnosis not present

## 2015-04-24 DIAGNOSIS — G459 Transient cerebral ischemic attack, unspecified: Secondary | ICD-10-CM | POA: Insufficient documentation

## 2015-04-24 DIAGNOSIS — G9389 Other specified disorders of brain: Secondary | ICD-10-CM | POA: Diagnosis not present

## 2015-04-24 LAB — CBC
HCT: 43.1 % (ref 39.0–52.0)
Hemoglobin: 14.6 g/dL (ref 13.0–17.0)
MCH: 30.6 pg (ref 26.0–34.0)
MCHC: 33.9 g/dL (ref 30.0–36.0)
MCV: 90.4 fL (ref 78.0–100.0)
Platelets: 249 10*3/uL (ref 150–400)
RBC: 4.77 MIL/uL (ref 4.22–5.81)
RDW: 13.3 % (ref 11.5–15.5)
WBC: 9.9 10*3/uL (ref 4.0–10.5)

## 2015-04-24 LAB — COMPREHENSIVE METABOLIC PANEL
ALT: 18 U/L (ref 17–63)
AST: 21 U/L (ref 15–41)
Albumin: 3.6 g/dL (ref 3.5–5.0)
Alkaline Phosphatase: 55 U/L (ref 38–126)
Anion gap: 7 (ref 5–15)
BUN: 14 mg/dL (ref 6–20)
CO2: 26 mmol/L (ref 22–32)
Calcium: 9.2 mg/dL (ref 8.9–10.3)
Chloride: 103 mmol/L (ref 101–111)
Creatinine, Ser: 1.33 mg/dL — ABNORMAL HIGH (ref 0.61–1.24)
GFR calc Af Amer: 58 mL/min — ABNORMAL LOW (ref >60)
GFR calc non Af Amer: 50 mL/min — ABNORMAL LOW (ref >60)
Glucose, Bld: 102 mg/dL — ABNORMAL HIGH (ref 65–99)
Potassium: 4.4 mmol/L (ref 3.5–5.1)
Sodium: 136 mmol/L (ref 135–145)
Total Bilirubin: 0.7 mg/dL (ref 0.3–1.2)
Total Protein: 6.5 g/dL (ref 6.5–8.1)

## 2015-04-24 LAB — SEDIMENTATION RATE: Sed Rate: 9 mm/hr (ref 0–16)

## 2015-04-24 NOTE — ED Notes (Signed)
Pt A&OX4, ambulatory at d/c with steady gait, NAD 

## 2015-04-24 NOTE — ED Notes (Signed)
PER EMS: Last seen normal 1300 today, onset of confusion. Family states he was unable to remember names of family or how to get to a store, memory impairment lasted about an hour. He was complaining on bilateral hand and cheek numbness and tingling. Upon arrival pt is A&Ox4, strong equal hand grips, no drifts, no slurred speech. BP-144/104, HR-75, O2-97% RA, CBG-90.

## 2015-04-24 NOTE — Discharge Instructions (Signed)
Aphasia Follow up with your primary care provider. Return for any weakness, numbness, confusion, or severe headache.  Aphasia is a neurological disorder caused by damage to the parts of the brain that control language. CAUSES  Aphasia is not a disease, but a symptom of brain damage. Aphasia is commonly seen in adults who have suffered a stroke. Aphasia also can result from:  A brain tumor.  Infection.  Head injury.  A rare type of dementia called Primary Progressive Aphasia. Common types of dementia may be associated with aphasia but can also exist without language problems. SYMPTOMS  Primary signs of the disorder include:  Problems expressing oneself when speaking.  Trouble understanding speech.  Difficulty with reading and writing.  Speaking in short or incomplete sentences.  Speaking in sentences that don't make sense.  Speaking unrecognizable words.  Interpreting figurative language literally.  Writing sentences that don't make sense. The type and severity of language problems depend on the precise location and extent of the damaged brain tissue. Aphasia can be divided into four broad categories:  Expressive aphasia - difficulty in conveying thoughts through speech or writing. The patient knows what they want to say, but cannot find the words they need.  Receptive aphasia - difficulty understanding spoken or written language. The patient hears the voice or sees the print but cannot make sense of the words.  Anomic or amnesia aphasia - difficulty in using the correct names for particular objects, people, places, or events. This is the least severe form of aphasia.  Global aphasia results from severe and extensive damage to the language areas of the brain. Patients lose almost all language function, both comprehension (understanding) and expression. They cannot speak, understand speech, read, or write. TREATMENT  Sometimes an individual will completely recover from aphasia  without treatment. In most cases, language therapy should begin as soon as possible. Language therapy should be tailored to the individual needs of the patient. Therapy with a speech pathologist involves exercises in which patients:  Read.  Write.  Follow directions.  Repeat what they hear.  Computer-aided therapy may also be used. PROGNOSIS  The outcome of aphasia is difficult to predict. People who are younger or have less extensive brain damage do better. The location of the injury is also important. The location is a clue to prognosis. In general, patients tend to recover skills in language comprehension (understanding) more completely than those skills involving expression (speaking or writing). Document Released: 05/03/2002 Document Revised: 11/03/2011 Document Reviewed: 10/31/2013 [facility] Patient Information 2015 [place], [place]. This information is not intended to replace advice given to you by your health care provider. Make sure you discuss any questions you have with your health care provider.  Transient Ischemic Attack A transient ischemic attack (TIA) is a "warning stroke" that causes stroke-like symptoms. A TIA does not cause lasting damage to the brain. It is important to know when to get help and what to do to prevent stroke or death.  HOME CARE   Take all medicines exactly as told by your doctor. Understand all your medicine instructions.  You may need to take aspirin or warfarin medicine. Take warfarin exactly as told.  Taking too much or too little warfarin is dangerous. Blood tests must be done as often as told by your doctor. These blood tests help your doctor make sure the amount of warfarin you are taking is right. A PT blood test measures how long it takes for blood to clot. Your PT is used to calculate  another value called an INR. Your PT and INR help your doctor adjust your warfarin dosage.  Food can cause problems with warfarin and affect the results of your  blood tests. This is true for foods high in vitamin K. Spinach, kale, broccoli, cabbage, collard and turnip greens, Brussels sprouts, peas, cauliflower, seaweed, and parsley are high in vitamin K as well as beef and pork liver, green tea, and soybean oil. Eat the same amount of food high in vitamin K. Avoid major changes in your diet. Tell your doctor before changing your diet. Talk to a food specialist (dietitian) if you have questions.  Many medicines can cause problems with warfarin and affect your PT and INR. Tell your doctor about all medicines you take. This includes vitamins and dietary pills (supplements). Be careful with aspirin and medicines that relieve redness, soreness, and puffiness (inflammation). Do not take or stop medicines unless your doctor tells you to.  Warfarin can cause a lot of bruising or bleeding. Hold pressure over cuts for longer than normal. Talk to your doctor about other side effects of warfarin.  Avoid sports or activities that may cause injury or bleeding.  Be careful when you shave, floss your teeth, or use sharp objects.  Avoid alcoholic drinks or drink very little alcohol while taking warfarin. Tell your doctor if you change how much alcohol you drink.  Tell your dentist and other doctors that you take warfarin before procedures.  Eat 5 or more servings of fruits and vegetables a day.  Follow your diet program as told, if you are given one.  Keep a healthy weight.  Stay active. Try to get at least 30 minutes of activity on most or all days.  Do not smoke.  Limit how much alcohol you drink even if you are not taking warfarin. Moderate alcohol use is:  No more than 2 drinks each day for men.  No more than 1 drink each day for women who are not pregnant.  Stop abusing drugs.  Keep your home safe so you do not fall. Try:  Putting grab bars in the bedroom and bathroom.  Raising toilet seats.  Putting a seat in the shower.  Keep all doctor visits  a told. GET HELP IF:  Your personality changes.  You have trouble swallowing.  You are seeing two of everything.  You are dizzy.  You have a fever.  Your skin starts to break down. GET HELP RIGHT AWAY IF:  The symptoms below may be a sign of an emergency. Do not wait to see if the symptoms go away. Call for help (911 in [place] Do not drive yourself to the hospital.  You have sudden weakness or numbness on the face, arm, or leg (especially on one side of the body).  You have sudden trouble walking or moving your arms or legs.  You have sudden confusion.  You have trouble talking or understanding.  You have sudden trouble seeing in one or both eyes.  You lose your balance or your movements are not smooth.  You have a sudden, severe headache with no known cause.  You have new chest pain or you feel your heart beating in a unsteady way.  You are partly or totally unaware of what is going on around you. MAKE SURE YOU:   Understand these instructions.  Will watch your condition.  Will get help right away if you are not doing well or get worse. Document Released: 05/20/2008 Document Revised: 12/26/2013 Document Reviewed:  11/16/2013 [employer] Patient Information 2015 [place], [place]. This information is not intended to replace advice given to you by your health care provider. Make sure you discuss any questions you have with your health care provider.

## 2015-04-24 NOTE — ED Provider Notes (Signed)
CSN: [id number]     Arrival date & time 04/24/15  34 History   First MD Initiated Contact with Patient 04/24/15 1542     Chief Complaint  Patient presents with  . Altered Mental Status     (Consider location/radiation/quality/duration/timing/severity/associated sxs/prior Treatment) Patient is a 80 y.o. male presenting with altered mental status. The history is provided by the patient. No language interpreter was used.  Altered Mental Status Associated symptoms: no abdominal pain, no fever, no headaches, no nausea, no vomiting and no weakness   Mr. Morelos is a 80 year old male with a history of hyperlipidemia who presents for memory loss while eating lunch with his wife today. His wife states that midsentence he forgot his dog's name as well as his children's names. He became confused and stated that he had to go somewhere and left out of the front door and came back through the back door a few minutes later. He told her, "I don't know anything." He denies that this is ever happened to him in the past. He was complaining of bilateral hand and cheek numbness and tingling that has since resolved but is still feeling groggy. Shortly after the incident he was at his baseline. His wife denies any slurred speech, facial droop, abnormal gait. He states that his mother had several mini strokes. He denies any family cardiac issues. He denies any smoking history. He is not on any anticoagulation medication. Neighbor of the family is a physician and requested that his sedimentation rate be ordered.  Past Medical History  Diagnosis Date  . GERD (gastroesophageal reflux disease)   . Seasonal allergies   . Arthritis   . Wears glasses   . Hyperlipemia   . HOH (hard of hearing)   . Wears hearing aid   . Anxiety   . Essential hypertension, benign 12/07/2014   Past Surgical History  Procedure Laterality Date  . Eye surgery      both cataracts  . Cervical fusion  2007  . Knee arthroscopy      left   . Appendectomy    . Tonsillectomy    . Hemorrhoid surgery    . Colonoscopy    . Upper gastrointestinal endoscopy    . Trigger finger release  06/08/2012    Procedure: RELEASE TRIGGER FINGER/A-1 PULLEY;  Surgeon: Wynonia Sours, MD;  Location: [place];  Service: Orthopedics;  Laterality: Left;  Release A-1 Pulley Left Long Finger  . Trigger finger release  07/14/2012    Procedure: RELEASE TRIGGER FINGER/A-1 PULLEY;  Surgeon: Wynonia Sours, MD;  Location: [place];  Service: Orthopedics;  Laterality: Right;  . Nasal septoplasty w/ turbinoplasty Bilateral 02/15/2013    Procedure: NASAL SEPTOPLASTY WITH BILATER TURBINATE REDUCTION;  Surgeon: Ascencion Dike, MD;  Location: [place];  Service: ENT;  Laterality: Bilateral;  . Carpal tunnel release Right 10/12/2013    Procedure: RIGHT CARPAL TUNNEL RELEASE;  Surgeon: Wynonia Sours, MD;  Location: [place];  Service: Orthopedics;  Laterality: Right;  . Trigger finger release Right 10/12/2013    Procedure: RELEASE TRIGGER FINGER/A-1 PULLEY RIGHT MIDDLE FINGER;  Surgeon: Wynonia Sours, MD;  Location: [place];  Service: Orthopedics;  Laterality: Right;   Family History  Problem Relation Age of Onset  . Mental retardation Father    Social History  Substance Use Topics  . Smoking status: Former Smoker    Quit date: 06/03/1984  . Smokeless tobacco: Former Systems developer  Quit date: 10/07/1986  . Alcohol Use: No    Review of Systems  Constitutional: Negative for fever.  Eyes: Negative for visual disturbance.  Respiratory: Negative for shortness of breath.   Cardiovascular: Negative for chest pain.  Gastrointestinal: Negative for nausea, vomiting and abdominal pain.  Neurological: Positive for numbness. Negative for dizziness, syncope, facial asymmetry, speech difficulty, weakness and headaches.  All other systems reviewed and are negative.     Allergies  Review of  patient's allergies indicates no known allergies.  Home Medications   Prior to Admission medications   Medication Sig Start Date End Date Taking? Authorizing Provider  atenolol (TENORMIN) 25 MG tablet Take 1 tablet (25 mg total) by mouth daily. 12/07/14  Yes Dorena Cookey, MD  latanoprost (XALATAN) 0.005 % ophthalmic solution Place 1 drop into both eyes at bedtime.    Yes Historical Provider, MD  loratadine (CLARITIN) 10 MG tablet Take 10 mg by mouth daily.   Yes Historical Provider, MD  LORazepam (ATIVAN) 0.5 MG tablet One half to one tablet each bedtime when necessary Patient taking differently: Take 0.25 mg by mouth daily as needed for anxiety or sleep. One half to one tablet each bedtime when necessary 09/12/14  Yes Dorena Cookey, MD  methocarbamol (ROBAXIN) 500 MG tablet Take 500 mg by mouth at bedtime.   Yes Historical Provider, MD  omeprazole (PRILOSEC) 40 MG capsule Take 1 capsule (40 mg total) by mouth daily. Patient taking differently: Take 40 mg by mouth 2 (two) times daily.  09/12/14  Yes Dorena Cookey, MD  rosuvastatin (CRESTOR) 20 MG tablet Take 20 mg by mouth daily.   Yes Historical Provider, MD  rosuvastatin (CRESTOR) 10 MG tablet Take 1 tablet (10 mg total) by mouth daily. Patient not taking: Reported on 04/24/2015 09/12/14   Dorena Cookey, MD   BP 113/65 mmHg  Pulse 63  Temp(Src) 98.4 F (36.9 C) (Oral)  Resp 15  SpO2 99% Physical Exam  Constitutional: He is oriented to person, place, and time. He appears well-developed and well-nourished.  HENT:  Head: Normocephalic and atraumatic.  Eyes: Conjunctivae are normal.  Neck: Normal range of motion. Neck supple.  Cardiovascular: Normal rate, regular rhythm and normal heart sounds.   Pulmonary/Chest: Effort normal and breath sounds normal. No respiratory distress. He has no wheezes. He has no rales.  Abdominal: Soft. He exhibits no distension.  Musculoskeletal: Normal range of motion.  Neurological: He is alert and  oriented to person, place, and time. He has normal strength. No sensory deficit. He displays a negative Romberg sign. Coordination normal. GCS eye subscore is 4. GCS verbal subscore is 5. GCS motor subscore is 6.  Cranial nerves III through XII intact. Normal finger to nose exam. Normal heel to shin bilaterally. Speaking full sentences. He has no facial droop. 5/5 strength in upper and lower bilateral extremities.   Skin: Skin is warm and dry.  Psychiatric: He has a normal mood and affect. His behavior is normal.  Nursing note and vitals reviewed.   ED Course  Procedures (including critical care time) Labs Review Labs Reviewed  COMPREHENSIVE METABOLIC PANEL - Abnormal; Notable for the following:    Glucose, Bld 102 (*)    Creatinine, Ser 1.33 (*)    GFR calc non Af Amer 50 (*)    GFR calc Af Amer 58 (*)    All other components within normal limits  CBC  SEDIMENTATION RATE  I-STAT TROPOININ, ED    Imaging Review Ct  Head Wo Contrast  04/24/2015   CLINICAL DATA:  Sudden onset memory loss lasting 2 hours. Diffuse headache. Tired feeling.  EXAM: CT HEAD WITHOUT CONTRAST  TECHNIQUE: Contiguous axial images were obtained from the base of the skull through the vertex without intravenous contrast.  COMPARISON:  Maxillofacial CT 01/27/2013  FINDINGS: There is mild enlargement of the cisterna magna. Mild generalized cerebral atrophy is within normal limits for age. There is no evidence of acute cortical infarct, intracranial hemorrhage, intra-axial mass, midline shift, or extra-axial fluid collection. There is a partially calcified 1.7 x 1.2 cm extra-axial lesion lateral to the left cerebellum along the posterior margin of the left mastoid bone which does not appear significantly changed in size from the prior maxillofacial CT.  Prior bilateral cataract extraction is noted. Visualized paranasal sinuses and mastoid air cells are clear.  IMPRESSION: 1. No evidence of acute intracranial abnormality. 2. The  unchanged 1.7 cm extra-axial mass in the left posterior fossa, most compatible with an incidental meningioma.   Electronically Signed   By: Logan Bores M.D.   On: 04/24/2015 17:24   I have personally reviewed and evaluated these images and lab results as part of my medical decision-making.   EKG Interpretation   Date/Time:  Tuesday April 24 2015 15:52:34 EDT Ventricular Rate:  73 PR Interval:  149 QRS Duration: 92 QT Interval:  375 QTC Calculation: 413 R Axis:   19 Text Interpretation:  Sinus rhythm Minimal ST elevation, anterior leads No  acute changes Confirmed by Kathrynn Humble, MD, Thelma Comp [phone]) on 04/24/2015  4:44:21 PM      MDM   Final diagnoses:  Aphasia  Transient cerebral ischemia, unspecified transient cerebral ischemia type   Patient presented for aphasia that lasted less than one hour today.  He is back to baseline now but states his head feels cloudy. He is well-appearing and in no acute distress. His labs are not concerning. CT of his head is negative for any intracranial abnormality. He has no signs of temporal arteritis. This is most likely a TIA. He was given the option to stay for further evaluation or be seen outpatient. He states he has close follow-up with [facility] primary care physician's and will make an appointment with them. I discussed return precautions such as confusion, severe headache, or facial droop and speech difficulties. Patient and family verbally agree with the plan.     Ottie Glazier, PA-C 04/25/15 2131  Varney Biles, MD 04/27/15 1742

## 2015-04-25 LAB — I-STAT TROPONIN, ED: Troponin i, poc: 0 ng/mL (ref 0.00–0.08)

## 2015-04-26 ENCOUNTER — Ambulatory Visit (INDEPENDENT_AMBULATORY_CARE_PROVIDER_SITE_OTHER): Payer: Medicare Other | Admitting: Family Medicine

## 2015-04-26 ENCOUNTER — Telehealth: Payer: Self-pay | Admitting: Family Medicine

## 2015-04-26 ENCOUNTER — Encounter: Payer: Self-pay | Admitting: Family Medicine

## 2015-04-26 VITALS — BP 120/70 | HR 72 | Temp 98.3°F | Wt 159.0 lb

## 2015-04-26 DIAGNOSIS — G459 Transient cerebral ischemic attack, unspecified: Secondary | ICD-10-CM | POA: Diagnosis not present

## 2015-04-26 DIAGNOSIS — E785 Hyperlipidemia, unspecified: Secondary | ICD-10-CM

## 2015-04-26 HISTORY — DX: Transient cerebral ischemic attack, unspecified: G45.9

## 2015-04-26 NOTE — Telephone Encounter (Signed)
error 

## 2015-04-26 NOTE — Progress Notes (Signed)
Subjective:    Patient ID: Jason Farmer, male    DOB: [birth year], 80 y.o.   MRN: [id number]  HPI Patient is seen following hospital follow-up. He was seen in the emergency department on 04/24/2015. Patient is accompanied by his wife. On the day that he was seen had some transient memory loss and confusion while eating lunch. She noticed that when he was in mid sentence he forgot his dog's name as well as several children's names. He stated that he had somewhere he had to go but when on the front door then came back stating that he "didn't know anything ".  He had difficulty recalling other peoples names but seemed to be aware of where he was. They took him promptly to ER. He did complain of some bilateral tingling sensation and numbness in his cheeks and hands but no focal weakness. No slurred speech. No facial droop. No abnormal gait. After about 1-1/2 hours they state his symptoms fully cleared. No history of prior similar symptoms. No recent chest pains.  He is a nonsmoker.  Does have hyperlipidemia treated with low-dose Crestor. Previous lipids not well controlled. Also takes low-dose atenolol. He has felt back to baseline other than some fatigue since recent event. He had CT head in ER which showed incidental 1.7 cm left posterior fossa meningioma. No acute findings. Labs were unremarkable. EKG unremarkable. Sedimentation rate normal. Was felt he had likely TIA. He was advised to start baby aspirin 1 daily. He has not had any recent carotid Dopplers or MRI of the brain. No palpitations. No chest pains. He's had no confusion whatsoever since this event.  Past Medical History  Diagnosis Date  . GERD (gastroesophageal reflux disease)   . Seasonal allergies   . Arthritis   . Wears glasses   . Hyperlipemia   . HOH (hard of hearing)   . Wears hearing aid   . Anxiety   . Essential hypertension, benign 12/07/2014   Past Surgical History  Procedure Laterality Date  . Eye surgery      both  cataracts  . Cervical fusion  2007  . Knee arthroscopy      left  . Appendectomy    . Tonsillectomy    . Hemorrhoid surgery    . Colonoscopy    . Upper gastrointestinal endoscopy    . Trigger finger release  06/08/2012    Procedure: RELEASE TRIGGER FINGER/A-1 PULLEY;  Surgeon: Wynonia Sours, MD;  Location: [facility];  Service: Orthopedics;  Laterality: Left;  Release A-1 Pulley Left Long Finger  . Trigger finger release  07/14/2012    Procedure: RELEASE TRIGGER FINGER/A-1 PULLEY;  Surgeon: Wynonia Sours, MD;  Location: [facility];  Service: Orthopedics;  Laterality: Right;  . Nasal septoplasty w/ turbinoplasty Bilateral 02/15/2013    Procedure: NASAL SEPTOPLASTY WITH BILATER TURBINATE REDUCTION;  Surgeon: Ascencion Dike, MD;  Location: [facility];  Service: ENT;  Laterality: Bilateral;  . Carpal tunnel release Right 10/12/2013    Procedure: RIGHT CARPAL TUNNEL RELEASE;  Surgeon: Wynonia Sours, MD;  Location: [facility];  Service: Orthopedics;  Laterality: Right;  . Trigger finger release Right 10/12/2013    Procedure: RELEASE TRIGGER FINGER/A-1 PULLEY RIGHT MIDDLE FINGER;  Surgeon: Wynonia Sours, MD;  Location: [facility];  Service: Orthopedics;  Laterality: Right;    reports that he quit smoking about 30 years ago. He quit smokeless tobacco use about 28 years ago.  He reports that he does not drink alcohol or use illicit drugs. family history includes Mental retardation in his father. No Known Allergies    Review of Systems  Constitutional: Negative for fatigue.  Eyes: Negative for visual disturbance.  Respiratory: Negative for cough, chest tightness and shortness of breath.   Cardiovascular: Negative for chest pain, palpitations and leg swelling.  Neurological: Negative for dizziness, syncope, weakness, light-headedness and headaches.       Objective:   Physical Exam  Constitutional: He is oriented to person,  place, and time. He appears well-developed and well-nourished.  HENT:  Right Ear: External ear normal.  Left Ear: External ear normal.  Mouth/Throat: Oropharynx is clear and moist.  Eyes: Pupils are equal, round, and reactive to light.  Neck: Neck supple. No thyromegaly present.  Cardiovascular: Normal rate and regular rhythm.   Pulmonary/Chest: Effort normal and breath sounds normal. No respiratory distress. He has no wheezes. He has no rales.  Musculoskeletal: He exhibits no edema.  Neurological: He is alert and oriented to person, place, and time. A cranial nerve deficit is present. Coordination normal.  He has some weakness with left knee extension which is related to recently discovered L4 nerve compression and he is currently getting physical therapy for that.  Psychiatric:  MMSE 29/30          Assessment & Plan:  Probable recent TIA. Patient had transient confusion which cleared after about an hour. CT unremarkable. Obtain carotid Dopplers. Set up MRI. Start aspirin one daily. Check fasting lipid panel and this is scheduled. We need to be more aggressive with lipid control.  His blood pressure is currently well-controlled. No history of diabetes. Follow-up immediately for any recurrent symptoms

## 2015-04-26 NOTE — Patient Instructions (Signed)
Transient Ischemic Attack A transient ischemic attack (TIA) is a "warning stroke" that causes stroke-like symptoms. Unlike a stroke, a TIA does not cause permanent damage to the brain. The symptoms of a TIA can happen very fast and do not last long. It is important to know the symptoms of a TIA and what to do. This can help prevent a major stroke or death. CAUSES   A TIA is caused by a temporary blockage in an artery in the brain or neck (carotid artery). The blockage does not allow the brain to get the blood supply it needs and can cause different symptoms. The blockage can be caused by either:  A blood clot.  Fatty buildup (plaque) in a neck or brain artery. RISK FACTORS  High blood pressure (hypertension).  High cholesterol.  Diabetes mellitus.  Heart disease.  The build up of plaque in the blood vessels (peripheral artery disease or atherosclerosis).  The build up of plaque in the blood vessels providing blood and oxygen to the brain (carotid artery stenosis).  An abnormal heart rhythm (atrial fibrillation).  Obesity.  Smoking.  Taking oral contraceptives (especially in combination with smoking).  Physical inactivity.  A diet high in fats, salt (sodium), and calories.  Alcohol use.  Use of illegal drugs (especially cocaine and methamphetamine).  Being male.  Being African American.  Being over the age of 70.  Family history of stroke.  Previous history of blood clots, stroke, TIA, or heart attack.  Sickle cell disease. SYMPTOMS  TIA symptoms are the same as a stroke but are temporary. These symptoms usually develop suddenly, or may be newly present upon awakening from sleep:  Sudden weakness or numbness of the face, arm, or leg, especially on one side of the body.  Sudden trouble walking or difficulty moving arms or legs.  Sudden confusion.  Sudden personality changes.  Trouble speaking (aphasia) or understanding.  Difficulty swallowing.  Sudden  trouble seeing in one or both eyes.  Double vision.  Dizziness.  Loss of balance or coordination.  Sudden severe headache with no known cause.  Trouble reading or writing.  Loss of bowel or bladder control.  Loss of consciousness. DIAGNOSIS  Your caregiver may be able to determine the presence or absence of a TIA based on your symptoms, history, and physical exam. Computed tomography (CT scan) of the brain is usually performed to help identify a TIA. Other tests may be done to diagnose a TIA. These tests may include:  Electrocardiography.  Continuous heart monitoring.  Echocardiography.  Carotid ultrasonography.  Magnetic resonance imaging (MRI).  A scan of the brain circulation.  Blood tests. PREVENTION  The risk of a TIA can be decreased by appropriately treating high blood pressure, high cholesterol, diabetes, heart disease, and obesity and by quitting smoking, limiting alcohol, and staying physically active. TREATMENT  Time is of the essence. Since the symptoms of TIA are the same as a stroke, it is important to seek treatment as soon as possible because you may need a medicine to dissolve the clot (thrombolytic) that cannot be given if too much time has passed. Treatment options vary. Treatment options may include rest, oxygen, intravenous (IV) fluids, and medicines to thin the blood (anticoagulants). Medicines and diet may be used to address diabetes, high blood pressure, and other risk factors. Measures will be taken to prevent short-term and long-term complications, including infection from breathing foreign material into the lungs (aspiration pneumonia), blood clots in the legs, and falls. Treatment options include procedures  to either remove plaque in the carotid arteries or dilate carotid arteries that have narrowed due to plaque. Those procedures are:  Carotid endarterectomy.  Carotid angioplasty and stenting. HOME CARE INSTRUCTIONS   Take all medicines prescribed  by your caregiver. Follow the directions carefully. Medicines may be used to control risk factors for a stroke. Be sure you understand all your medicine instructions.  You may be told to take aspirin or the anticoagulant warfarin. Warfarin needs to be taken exactly as instructed.  Taking too much or too little warfarin is dangerous. Too much warfarin increases the risk of bleeding. Too little warfarin continues to allow the risk for blood clots. While taking warfarin, you will need to have regular blood tests to measure your blood clotting time. A PT blood test measures how long it takes for blood to clot. Your PT is used to calculate another value called an INR. Your PT and INR help your caregiver to adjust your dose of warfarin. The dose can change for many reasons. It is critically important that you take warfarin exactly as prescribed.  Many foods, especially foods high in vitamin K can interfere with warfarin and affect the PT and INR. Foods high in vitamin K include spinach, kale, broccoli, cabbage, collard and turnip greens, brussels sprouts, peas, cauliflower, seaweed, and parsley as well as beef and pork liver, green tea, and soybean oil. You should eat a consistent amount of foods high in vitamin K. Avoid major changes in your diet, or notify your caregiver before changing your diet. Arrange a visit with a dietitian to answer your questions.  Many medicines can interfere with warfarin and affect the PT and INR. You must tell your caregiver about any and all medicines you take, this includes all vitamins and supplements. Be especially cautious with aspirin and anti-inflammatory medicines. Do not take or discontinue any prescribed or over-the-counter medicine except on the advice of your caregiver or pharmacist.  Warfarin can have side effects, such as excessive bruising or bleeding. You will need to hold pressure over cuts for longer than usual. Your caregiver or pharmacist will discuss other  potential side effects.  Avoid sports or activities that may cause injury or bleeding.  Be mindful when shaving, flossing your teeth, or handling sharp objects.  Alcohol can change the body's ability to handle warfarin. It is best to avoid alcoholic drinks or consume only very small amounts while taking warfarin. Notify your caregiver if you change your alcohol intake.  Notify your dentist or other caregivers before procedures.  Eat a diet that includes 5 or more servings of fruits and vegetables each day. This may reduce the risk of stroke. Certain diets may be prescribed to address high blood pressure, high cholesterol, diabetes, or obesity.  A low-sodium, low-saturated fat, low-trans fat, low-cholesterol diet is recommended to manage high blood pressure.  A low-saturated fat, low-trans fat, low-cholesterol, and high-fiber diet may control cholesterol levels.  A controlled-carbohydrate, controlled-sugar diet is recommended to manage diabetes.  A reduced-calorie, low-sodium, low-saturated fat, low-trans fat, low-cholesterol diet is recommended to manage obesity.  Maintain a healthy weight.  Stay physically active. It is recommended that you get at least 30 minutes of activity on most or all days.  Do not smoke.  Limit alcohol use even if you are not taking warfarin. Moderate alcohol use is considered to be:  No more than 2 drinks each day for men.  No more than 1 drink each day for nonpregnant women.  Stop drug abuse.  Home safety. A safe home environment is important to reduce the risk of falls. Your caregiver may arrange for specialists to evaluate your home. Having grab bars in the bedroom and bathroom is often important. Your caregiver may arrange for equipment to be used at home, such as raised toilets and a seat for the shower.  Follow all instructions for follow-up with your caregiver. This is very important. This includes any referrals and lab tests. Proper follow up can  prevent a stroke or another TIA from occurring. SEEK MEDICAL CARE IF:  You have personality changes.  You have difficulty swallowing.  You are seeing double.  You have dizziness.  You have a fever.  You have skin breakdown. SEEK IMMEDIATE MEDICAL CARE IF:  Any of these symptoms may represent a serious problem that is an emergency. Do not wait to see if the symptoms will go away. Get medical help right away. Call your local emergency services (911 in [place] Do not drive yourself to the hospital.  You have sudden weakness or numbness of the face, arm, or leg, especially on one side of the body.  You have sudden trouble walking or difficulty moving arms or legs.  You have sudden confusion.  You have trouble speaking (aphasia) or understanding.  You have sudden trouble seeing in one or both eyes.  You have a loss of balance or coordination.  You have a sudden, severe headache with no known cause.  You have new chest pain or an irregular heartbeat.  You have a partial or total loss of consciousness. MAKE SURE YOU:   Understand these instructions.  Will watch your condition.  Will get help right away if you are not doing well or get worse. Document Released: 05/21/2005 Document Revised: 08/16/2013 Document Reviewed: 11/16/2013 [facility] Patient Information 2015 [place], [place]. This information is not intended to replace advice given to you by your health care provider. Make sure you discuss any questions you have with your health care provider.  Start today with regular aspirin 325 mg once daily We will be setting up carotid doppler and MRI brain to further assess.

## 2015-04-26 NOTE — Progress Notes (Signed)
Pre visit review using our clinic review tool, if applicable. No additional management support is needed unless otherwise documented below in the visit note. 

## 2015-05-01 ENCOUNTER — Other Ambulatory Visit: Payer: Self-pay

## 2015-05-01 ENCOUNTER — Other Ambulatory Visit (INDEPENDENT_AMBULATORY_CARE_PROVIDER_SITE_OTHER): Payer: Medicare Other

## 2015-05-01 DIAGNOSIS — E785 Hyperlipidemia, unspecified: Secondary | ICD-10-CM

## 2015-05-01 LAB — LIPID PANEL
Cholesterol: 182 mg/dL (ref 0–200)
HDL: 46.2 mg/dL (ref >39.00)
LDL Cholesterol: 112 mg/dL — ABNORMAL HIGH (ref 0–99)
NonHDL: 135.79
Total CHOL/HDL Ratio: 4
Triglycerides: 121 mg/dL (ref 0.0–149.0)
VLDL: 24.2 mg/dL (ref 0.0–40.0)

## 2015-05-01 MED ORDER — ROSUVASTATIN CALCIUM 20 MG PO TABS: 20.0000 mg | ORAL_TABLET | Freq: Every day | ORAL | Status: DC

## 2015-05-03 ENCOUNTER — Telehealth: Payer: Self-pay | Admitting: Family Medicine

## 2015-05-03 NOTE — Telephone Encounter (Signed)
Patient wants RX Crestor sent over to the [place]. The fax number is [phone]

## 2015-05-07 ENCOUNTER — Ambulatory Visit
Admission: RE | Admit: 2015-05-07 | Discharge: 2015-05-07 | Disposition: A | Discharge status: Home / Self Care | Payer: Medicare Other | Source: Ambulatory Visit | Attending: Family Medicine | Admitting: Family Medicine

## 2015-05-07 DIAGNOSIS — G459 Transient cerebral ischemic attack, unspecified: Secondary | ICD-10-CM

## 2015-05-07 DIAGNOSIS — I6523 Occlusion and stenosis of bilateral carotid arteries: Secondary | ICD-10-CM | POA: Diagnosis not present

## 2015-05-09 MED ORDER — ROSUVASTATIN CALCIUM 20 MG PO TABS: 20.0000 mg | ORAL_TABLET | Freq: Every day | ORAL | Status: DC

## 2015-05-09 NOTE — Telephone Encounter (Signed)
Rx faxed

## 2015-05-14 DIAGNOSIS — M65351 Trigger finger, right little finger: Secondary | ICD-10-CM | POA: Diagnosis not present

## 2015-05-14 DIAGNOSIS — M5416 Radiculopathy, lumbar region: Secondary | ICD-10-CM | POA: Diagnosis not present

## 2015-05-14 DIAGNOSIS — M65342 Trigger finger, left ring finger: Secondary | ICD-10-CM | POA: Diagnosis not present

## 2015-05-14 DIAGNOSIS — M720 Palmar fascial fibromatosis [Dupuytren]: Secondary | ICD-10-CM | POA: Diagnosis not present

## 2015-05-15 DIAGNOSIS — M5416 Radiculopathy, lumbar region: Secondary | ICD-10-CM | POA: Diagnosis not present

## 2015-05-16 ENCOUNTER — Telehealth: Payer: Self-pay | Admitting: Family Medicine

## 2015-05-16 ENCOUNTER — Ambulatory Visit
Admission: RE | Admit: 2015-05-16 | Discharge: 2015-05-16 | Disposition: A | Discharge status: Home / Self Care | Payer: Medicare Other | Source: Ambulatory Visit | Attending: Family Medicine | Admitting: Family Medicine

## 2015-05-16 DIAGNOSIS — G459 Transient cerebral ischemic attack, unspecified: Secondary | ICD-10-CM

## 2015-05-16 DIAGNOSIS — R413 Other amnesia: Secondary | ICD-10-CM | POA: Diagnosis not present

## 2015-05-16 NOTE — Telephone Encounter (Signed)
Pt saw dr Elease Hashimoto on 9-1 and generic crestor was increase to 20 mg. Pt received shipment from [facility] and the instruction was to cut 20 mg  in half. Please verify and call patient.

## 2015-05-17 ENCOUNTER — Encounter: Payer: Self-pay | Admitting: *Deleted

## 2015-05-17 NOTE — Telephone Encounter (Signed)
Left detailed message on machine for patient to take 1 tab of crestor at bedtime every night.

## 2015-05-18 ENCOUNTER — Other Ambulatory Visit: Payer: Self-pay | Admitting: *Deleted

## 2015-05-18 MED ORDER — ROSUVASTATIN CALCIUM 20 MG PO TABS: 20.0000 mg | ORAL_TABLET | Freq: Every day | ORAL | Status: DC

## 2015-05-18 NOTE — Telephone Encounter (Signed)
New Rx faxed to [facility] [phone].

## 2015-06-08 DIAGNOSIS — H401131 Primary open-angle glaucoma, bilateral, mild stage: Secondary | ICD-10-CM | POA: Diagnosis not present

## 2015-06-13 ENCOUNTER — Encounter: Payer: Self-pay | Admitting: Adult Health

## 2015-06-13 ENCOUNTER — Ambulatory Visit (INDEPENDENT_AMBULATORY_CARE_PROVIDER_SITE_OTHER): Payer: Medicare Other | Admitting: Adult Health

## 2015-06-13 VITALS — BP 130/68 | HR 66 | Temp 98.3°F | Ht 69.0 in | Wt 159.7 lb

## 2015-06-13 DIAGNOSIS — Z23 Encounter for immunization: Secondary | ICD-10-CM

## 2015-06-13 DIAGNOSIS — Z7689 Persons encountering health services in other specified circumstances: Secondary | ICD-10-CM

## 2015-06-13 DIAGNOSIS — Z7189 Other specified counseling: Secondary | ICD-10-CM

## 2015-06-13 DIAGNOSIS — R252 Cramp and spasm: Secondary | ICD-10-CM | POA: Diagnosis not present

## 2015-06-13 DIAGNOSIS — M65342 Trigger finger, left ring finger: Secondary | ICD-10-CM | POA: Diagnosis not present

## 2015-06-13 NOTE — Patient Instructions (Addendum)
It was great meeting you today  As discussed at your visit  1. Take a 81mg  ASA 2. Discontinue the statin medication and we will revisit this at your physical 3. You can try taking CoQ10 for the muscle cramps  Please let me know if you need anything in the meantime.   Health Maintenance, Male A healthy lifestyle and preventative care can promote health and wellness.  Maintain regular health, dental, and eye exams.  Eat a healthy diet. Foods like vegetables, fruits, whole grains, low-fat dairy products, and lean protein foods contain the nutrients you need and are low in calories. Decrease your intake of foods high in solid fats, added sugars, and salt. Get information about a proper diet from your health care provider, if necessary.  Regular physical exercise is one of the most important things you can do for your health. Most adults should get at least 150 minutes of moderate-intensity exercise (any activity that increases your heart rate and causes you to sweat) each week. In addition, most adults need muscle-strengthening exercises on 2 or more days a week.   Maintain a healthy weight. The body mass index (BMI) is a screening tool to identify possible weight problems. It provides an estimate of body fat based on height and weight. Your health care provider can find your BMI and can help you achieve or maintain a healthy weight. For males 20 years and older:  A BMI below 18.5 is considered underweight.  A BMI of 18.5 to 24.9 is normal.  A BMI of 25 to 29.9 is considered overweight.  A BMI of 30 and above is considered obese.  Maintain normal blood lipids and cholesterol by exercising and minimizing your intake of saturated fat. Eat a balanced diet with plenty of fruits and vegetables. Blood tests for lipids and cholesterol should begin at age 58 and be repeated every 5 years. If your lipid or cholesterol levels are high, you are over age 40, or you are at high risk for heart disease,  you may need your cholesterol levels checked more frequently.Ongoing high lipid and cholesterol levels should be treated with medicines if diet and exercise are not working.  If you smoke, find out from your health care provider how to quit. If you do not use tobacco, do not start.  Lung cancer screening is recommended for adults aged 14-80 years who are at high risk for developing lung cancer because of a history of smoking. A yearly low-dose CT scan of the lungs is recommended for people who have at least a 30-pack-year history of smoking and are current smokers or have quit within the past 15 years. A pack year of smoking is smoking an average of 1 pack of cigarettes a day for 1 year (for example, a 30-pack-year history of smoking could mean smoking 1 pack a day for 30 years or 2 packs a day for 15 years). Yearly screening should continue until the smoker has stopped smoking for at least 15 years. Yearly screening should be stopped for people who develop a health problem that would prevent them from having lung cancer treatment.  If you choose to drink alcohol, do not have more than 2 drinks per day. One drink is considered to be 12 oz (360 mL) of beer, 5 oz (150 mL) of wine, or 1.5 oz (45 mL) of liquor.  Avoid the use of street drugs. Do not share needles with anyone. Ask for help if you need support or instructions about stopping the  use of drugs.  High blood pressure causes heart disease and increases the risk of stroke. High blood pressure is more likely to develop in:  People who have blood pressure in the end of the normal range (100-139/85-89 mm Hg).  People who are overweight or obese.  People who are African American.  If you are 59-109 years of age, have your blood pressure checked every 3-5 years. If you are 52 years of age or older, have your blood pressure checked every year. You should have your blood pressure measured twice--once when you are at a hospital or clinic, and once when  you are not at a hospital or clinic. Record the average of the two measurements. To check your blood pressure when you are not at a hospital or clinic, you can use:  An automated blood pressure machine at a pharmacy.  A home blood pressure monitor.  If you are 62-56 years old, ask your health care provider if you should take aspirin to prevent heart disease.  Diabetes screening involves taking a blood sample to check your fasting blood sugar level. This should be done once every 3 years after age 85 if you are at a normal weight and without risk factors for diabetes. Testing should be considered at a younger age or be carried out more frequently if you are overweight and have at least 1 risk factor for diabetes.  Colorectal cancer can be detected and often prevented. Most routine colorectal cancer screening begins at the age of 25 and continues through age 38. However, your health care provider may recommend screening at an earlier age if you have risk factors for colon cancer. On a yearly basis, your health care provider may provide home test kits to check for hidden blood in the stool. A small camera at the end of a tube may be used to directly examine the colon (sigmoidoscopy or colonoscopy) to detect the earliest forms of colorectal cancer. Talk to your health care provider about this at age 88 when routine screening begins. A direct exam of the colon should be repeated every 5-10 years through age 46, unless early forms of precancerous polyps or small growths are found.  People who are at an increased risk for hepatitis B should be screened for this virus. You are considered at high risk for hepatitis B if:  You were born in a country where hepatitis B occurs often. Talk with your health care provider about which countries are considered high risk.  Your parents were born in a high-risk country and you have not received a shot to protect against hepatitis B (hepatitis B vaccine).  You have HIV  or AIDS.  You use needles to inject street drugs.  You live with, or have sex with, someone who has hepatitis B.  You are a man who has sex with other men (MSM).  You get hemodialysis treatment.  You take certain medicines for conditions like cancer, organ transplantation, and autoimmune conditions.  Hepatitis C blood testing is recommended for all people born from [birth year] through 1963 and any individual with known risk factors for hepatitis C.  Healthy men should no longer receive prostate-specific antigen (PSA) blood tests as part of routine cancer screening. Talk to your health care provider about prostate cancer screening.  Testicular cancer screening is not recommended for adolescents or adult males who have no symptoms. Screening includes self-exam, a health care provider exam, and other screening tests. Consult with your health care provider about any symptoms  you have or any concerns you have about testicular cancer.  Practice safe sex. Use condoms and avoid high-risk sexual practices to reduce the spread of sexually transmitted infections (STIs).  You should be screened for STIs, including gonorrhea and chlamydia if:  You are sexually active and are younger than 24 years.  You are older than 24 years, and your health care provider tells you that you are at risk for this type of infection.  Your sexual activity has changed since you were last screened, and you are at an increased risk for chlamydia or gonorrhea. Ask your health care provider if you are at risk.  If you are at risk of being infected with HIV, it is recommended that you take a prescription medicine daily to prevent HIV infection. This is called pre-exposure prophylaxis (PrEP). You are considered at risk if:  You are a man who has sex with other men (MSM).  You are a heterosexual man who is sexually active with multiple partners.  You take drugs by injection.  You are sexually active with a partner who has  HIV.  Talk with your health care provider about whether you are at high risk of being infected with HIV. If you choose to begin PrEP, you should first be tested for HIV. You should then be tested every 3 months for as long as you are taking PrEP.  Use sunscreen. Apply sunscreen liberally and repeatedly throughout the day. You should seek shade when your shadow is shorter than you. Protect yourself by wearing long sleeves, pants, a wide-brimmed hat, and sunglasses year round whenever you are outdoors.  Tell your health care provider of new moles or changes in moles, especially if there is a change in shape or color. Also, tell your health care provider if a mole is larger than the size of a pencil eraser.  A one-time screening for abdominal aortic aneurysm (AAA) and surgical repair of large AAAs by ultrasound is recommended for men aged 61-75 years who are current or former smokers.  Stay current with your vaccines (immunizations).   This information is not intended to replace advice given to you by your health care provider. Make sure you discuss any questions you have with your health care provider.   Document Released: 02/07/2008 Document Revised: 09/01/2014 Document Reviewed: 01/06/2011 Elsevier Interactive Patient Education [employer].

## 2015-06-13 NOTE — Progress Notes (Signed)
HPI:  Jason Farmer is here to establish care. He is a pleasant caucasian male who  has a past medical history of GERD (gastroesophageal reflux disease); Seasonal allergies; Arthritis; Wears glasses; Hyperlipemia; HOH (hard of hearing); Wears hearing aid; Anxiety; Essential tremor; TIA (transient ischemic attack) (04/2015); Glaucoma; and Back pain.  Last PCP and physical: January 2016 with MD Sherren Mocha  Immunizations:UTD Diet: Eats healthy Exercise: Does not exercise a lot.  Colonoscopy: Is due in 2017. Every five years for Polyps.  Dentist: Twice a year Eye: Every two months  Followed By  Orthopedics for back pain Eye Doctor for glaucoma Neurology- Tomi Likens   Has the following chronic problems that require follow up and concerns today:   Muscle Cramps  - This is a chronic issue that he has had for 20 years. He has tried a few statins in the past and all have caused him cramping in the legs. He is currently on Crestor 20 mg and continues to have leg cramps.He endorses that the cramps are so bad at times that he has to pull his car over and get out and stretch.   TIA Was seen in the ER on 04/24/2015.On the day that he was seen had some transient memory loss and confusion while eating lunch. She noticed that when he was in mid sentence he forgot his dog's name as well as several children's names. He stated that he had somewhere he had to go but when on the front door then came back stating that he "didn't know anything ".  He had difficulty recalling other peoples names but seemed to be aware of where he was. They took him promptly to ER. He did complain of some bilateral tingling sensation and numbness in his cheeks and hands but no focal weakness. No slurred speech. No facial droop. No abnormal gait. After about 1-1/2 hours they state his symptoms fully cleared. No history of prior similar symptoms. No recent chest pains.  He had CT head in ER which showed incidental 1.7 cm left  posterior fossa meningioma. No acute findings. Labs were unremarkable. EKG unremarkable. Sedimentation rate normal. Was felt he had likely TIA.  MRI showed FINDINGS: Cerebral volume is within normal limits for age. Mega cisterna magna anatomic variant incidentally noted. Major intracranial vascular flow voids are preserved. No restricted diffusion to suggest acute infarction. No midline shift, mass effect, ventriculomegaly, or acute intracranial hemorrhage. Cervicomedullary junction and pituitary are within normal limits.  US Carotid:  IMPRESSION: 1. Mild (1-49%) stenosis proximal right internal carotid artery secondary to mild smooth heterogeneous atherosclerotic plaque. 2. Mild (1-49%) stenosis proximal left internal carotid artery secondary to mild smooth heterogeneous atherosclerotic plaque. 3. Vertebral arteries remain patent with normal antegrade flow.  He has not had any similar symptoms since.   ROS negative for unless reported above: fevers, chills,feeling poorly, unintentional weight loss, hearing or vision loss, chest pain, palpitations, leg claudication, struggling to breath,Not feeling congested in the chest, no orthopenia, no cough,no wheezing, normal appetite, no soft tissue swelling, no hemoptysis, melena, hematochezia, hematuria, falls, loc, si, or thoughts of self harm.   Past Medical History  Diagnosis Date  . GERD (gastroesophageal reflux disease)   . Seasonal allergies   . Arthritis   . Wears glasses   . Hyperlipemia   . HOH (hard of hearing)   . Wears hearing aid   . Anxiety   . Essential hypertension, benign 12/07/2014    Past Surgical History  Procedure Laterality Date  .  Eye surgery      both cataracts  . Cervical fusion  2007  . Knee arthroscopy      left  . Appendectomy    . Tonsillectomy    . Hemorrhoid surgery    . Colonoscopy    . Upper gastrointestinal endoscopy    . Trigger finger release  06/08/2012    Procedure: RELEASE TRIGGER  FINGER/A-1 PULLEY;  Surgeon: Wynonia Sours, MD;  Location: [facility];  Service: Orthopedics;  Laterality: Left;  Release A-1 Pulley Left Long Finger  . Trigger finger release  07/14/2012    Procedure: RELEASE TRIGGER FINGER/A-1 PULLEY;  Surgeon: Wynonia Sours, MD;  Location: [place] [facility];  Service: Orthopedics;  Laterality: Right;  . Nasal septoplasty w/ turbinoplasty Bilateral 02/15/2013    Procedure: NASAL SEPTOPLASTY WITH BILATER TURBINATE REDUCTION;  Surgeon: Ascencion Dike, MD;  Location: [facility];  Service: ENT;  Laterality: Bilateral;  . Carpal tunnel release Right 10/12/2013    Procedure: RIGHT CARPAL TUNNEL RELEASE;  Surgeon: Wynonia Sours, MD;  Location: [facility];  Service: Orthopedics;  Laterality: Right;  . Trigger finger release Right 10/12/2013    Procedure: RELEASE TRIGGER FINGER/A-1 PULLEY RIGHT MIDDLE FINGER;  Surgeon: Wynonia Sours, MD;  Location: [place];  Service: Orthopedics;  Laterality: Right;    Family History  Problem Relation Age of Onset  . Mental retardation Father     Social History   Social History  . Marital Status: Married    Spouse Name: N/A  . Number of Children: N/A  . Years of Education: N/A   Social History Main Topics  . Smoking status: Former Smoker    Quit date: 06/03/1984  . Smokeless tobacco: Former Systems developer    Quit date: 10/07/1986  . Alcohol Use: No  . Drug Use: No  . Sexual Activity: Not Asked   Other Topics Concern  . None   Social History Narrative     Current outpatient prescriptions:  .  atenolol (TENORMIN) 25 MG tablet, Take 1 tablet (25 mg total) by mouth daily., Disp: 90 tablet, Rfl: 3 .  latanoprost (XALATAN) 0.005 % ophthalmic solution, Place 1 drop into both eyes at bedtime. , Disp: , Rfl:  .  loratadine (CLARITIN) 10 MG tablet, Take 10 mg by mouth daily., Disp: , Rfl:  .  LORazepam (ATIVAN) 0.5 MG tablet, One half to one tablet each bedtime when  necessary (Patient taking differently: Take 0.25 mg by mouth daily as needed for anxiety or sleep. One half to one tablet each bedtime when necessary), Disp: 100 tablet, Rfl: 3 .  omeprazole (PRILOSEC) 40 MG capsule, Take 1 capsule (40 mg total) by mouth daily. (Patient taking differently: Take 40 mg by mouth 2 (two) times daily. ), Disp: 100 capsule, Rfl: 3 .  rosuvastatin (CRESTOR) 20 MG tablet, Take 1 tablet (20 mg total) by mouth daily., Disp: 90 tablet, Rfl: 3  EXAM:  Filed Vitals:   06/13/15 1311  BP: 130/68  Pulse: 66  Temp: 98.3 F (36.8 C)    Body mass index is 23.57 kg/(m^2).  GENERAL: vitals reviewed and listed above, alert, oriented, appears well hydrated and in no acute distress  HEENT: atraumatic, conjunttiva clear, no obvious abnormalities on inspection of external nose and ears. TM's visualized. No cerumen impaction  NECK: Neck is soft and supple without masses, no adenopathy or thyromegaly, trachea midline, no JVD. Normal range of motion.   LUNGS: clear  to auscultation bilaterally, no wheezes, rales or rhonchi, good air movement  CV: Regular rate and rhythm, normal S1/S2, no audible murmurs, gallops, or rubs. No carotid bruit and no peripheral edema.   MS: moves all extremities without noticeable abnormality. No edema noted  Abd: soft/nontender/nondistended/normal bowel sounds   Skin: warm and dry, no rash   Extremities: No clubbing, cyanosis, or edema. Capillary refill is WNL. Pulses intact bilaterally in upper and lower extremities.   Neuro: CN II-XII intact, sensation and reflexes normal throughout, 5/5 muscle strength in bilateral upper and lower extremities. Normal finger to nose. Normal rapid alternating movements. Normal romberg. No pronator drift.   PSYCH: pleasant and cooperative, no obvious depression or anxiety  ASSESSMENT AND PLAN:  1. Encounter to establish care - Follow up in January for CPE - Follow up sooner if needed - Exercise and eat a  healthy diet -   2. Encounter for immunization High dose flu given   3. Muscle cramps - This has been an ongoing problem that has started before taking statin medications. Statins are probably intensifying muscle cramps. Going to try a statin holiday until complete physical. Will retest lipids at that time.  - Consider referral to lipid clinic.   -We reviewed the PMH, PSH, FH, SH, Meds and Allergies. -We provided refills for any medications we will prescribe as needed. -We addressed current concerns per orders and patient instructions. -We have asked for records for pertinent exams, studies, vaccines and notes from previous providers. -We have advised patient to follow up per instructions below.   -Patient advised to return or notify a provider immediately if symptoms worsen or persist or new concerns arise.    Dorothyann Peng, AGNP

## 2015-06-13 NOTE — Progress Notes (Signed)
Pre visit review using our clinic review tool, if applicable. No additional management support is needed unless otherwise documented below in the visit note. 

## 2015-06-15 ENCOUNTER — Encounter: Payer: Self-pay | Admitting: Adult Health

## 2015-06-15 ENCOUNTER — Ambulatory Visit (INDEPENDENT_AMBULATORY_CARE_PROVIDER_SITE_OTHER): Payer: Medicare Other | Admitting: Adult Health

## 2015-06-15 DIAGNOSIS — J0100 Acute maxillary sinusitis, unspecified: Secondary | ICD-10-CM

## 2015-06-15 MED ORDER — DOXYCYCLINE HYCLATE 100 MG PO CAPS: 100.0000 mg | ORAL_CAPSULE | Freq: Two times a day (BID) | ORAL | Status: DC

## 2015-06-15 NOTE — Progress Notes (Signed)
Subjective:    Patient ID: Jason Farmer, male    DOB: 1934/11/15, 80 y.o.   MRN: [id number]  HPI  80 year old male who presents to the office today for URI type symptoms that started yesterday. He started with a sore throat that became worse as the day went on and then started to have sinus pressure with drainage. He endorses having a productive cough and low grade fever (99). Has fatigue but no muscle aches.   Had a grandson that came over the previous weekend - was feeling ill and was diagnosed with CAP.    No n/v/d  He is leaving for a 14 day trip to [place] in 1.5 days.   Review of Systems  Constitutional: Positive for fever, activity change and fatigue. Negative for chills and appetite change.  HENT: Positive for congestion, postnasal drip, rhinorrhea, sinus pressure and sore throat. Negative for ear discharge and ear pain.   Respiratory: Positive for cough. Negative for choking, chest tightness, shortness of breath, wheezing and stridor.   Cardiovascular: Negative.   Genitourinary: Negative.   Neurological: Positive for headaches. Negative for dizziness and light-headedness.  Hematological: Positive for adenopathy.  All other systems reviewed and are negative.  Past Medical History  Diagnosis Date  . GERD (gastroesophageal reflux disease)   . Seasonal allergies   . Arthritis   . Wears glasses   . Hyperlipemia   . HOH (hard of hearing)   . Wears hearing aid   . Anxiety   . Essential tremor   . TIA (transient ischemic attack) 04/2015  . Glaucoma   . Back pain     Social History   Social History  . Marital Status: Married    Spouse Name: N/A  . Number of Children: N/A  . Years of Education: N/A   Occupational History  . Not on file.   Social History Main Topics  . Smoking status: Former Smoker    Quit date: 06/03/1984  . Smokeless tobacco: Former Systems developer    Quit date: 10/07/1986  . Alcohol Use: No  . Drug Use: No  . Sexual Activity: Not on file    Other Topics Concern  . Not on file   Social History Narrative   Retired from [employer]   Married    4 children, one son lives locally    Past Surgical History  Procedure Laterality Date  . Eye surgery      both cataracts  . Cervical fusion  2007  . Knee arthroscopy      left  . Appendectomy    . Tonsillectomy    . Hemorrhoid surgery    . Colonoscopy    . Upper gastrointestinal endoscopy    . Trigger finger release  06/08/2012    Procedure: RELEASE TRIGGER FINGER/A-1 PULLEY;  Surgeon: Wynonia Sours, MD;  Location: [facility];  Service: Orthopedics;  Laterality: Left;  Release A-1 Pulley Left Long Finger  . Trigger finger release  07/14/2012    Procedure: RELEASE TRIGGER FINGER/A-1 PULLEY;  Surgeon: Wynonia Sours, MD;  Location: [facility];  Service: Orthopedics;  Laterality: Right;  . Nasal septoplasty w/ turbinoplasty Bilateral 02/15/2013    Procedure: NASAL SEPTOPLASTY WITH BILATER TURBINATE REDUCTION;  Surgeon: Ascencion Dike, MD;  Location: [facility];  Service: ENT;  Laterality: Bilateral;  . Carpal tunnel release Right 10/12/2013    Procedure: RIGHT CARPAL TUNNEL RELEASE;  Surgeon: Wynonia Sours, MD;  Location: [facility];  Service: Orthopedics;  Laterality: Right;  . Trigger finger release Right 10/12/2013    Procedure: RELEASE TRIGGER FINGER/A-1 PULLEY RIGHT MIDDLE FINGER;  Surgeon: Wynonia Sours, MD;  Location: [facility];  Service: Orthopedics;  Laterality: Right;    Family History  Problem Relation Age of Onset  . Dementia Father   . Heart failure Father   . Glaucoma Mother     No Known Allergies  Current Outpatient Prescriptions on File Prior to Visit  Medication Sig Dispense Refill  . atenolol (TENORMIN) 25 MG tablet Take 1 tablet (25 mg total) by mouth daily. 90 tablet 3  . latanoprost (XALATAN) 0.005 % ophthalmic solution Place 1 drop into both eyes at bedtime.     Marland Kitchen loratadine  (CLARITIN) 10 MG tablet Take 10 mg by mouth daily.    Marland Kitchen LORazepam (ATIVAN) 0.5 MG tablet One half to one tablet each bedtime when necessary (Patient taking differently: Take 0.25 mg by mouth daily as needed for anxiety or sleep. One half to one tablet each bedtime when necessary) 100 tablet 3  . omeprazole (PRILOSEC) 40 MG capsule Take 1 capsule (40 mg total) by mouth daily. (Patient taking differently: Take 40 mg by mouth 2 (two) times daily. ) 100 capsule 3  . rosuvastatin (CRESTOR) 20 MG tablet Take 1 tablet (20 mg total) by mouth daily. 90 tablet 3   No current facility-administered medications on file prior to visit.    There were no vitals taken for this visit.       Objective:   Physical Exam  Constitutional: He is oriented to person, place, and time. He appears well-developed and well-nourished. No distress.  Appears tired  HENT:  Head: Normocephalic and atraumatic.  Right Ear: External ear normal.  Left Ear: External ear normal.  Nose: Nose normal.  Mouth/Throat: Oropharynx is clear and moist. No oropharyngeal exudate.  +PND No signs of otitis media   Eyes: Conjunctivae and EOM are normal. Pupils are equal, round, and reactive to light. Right eye exhibits no discharge. Left eye exhibits no discharge.  Neck: No thyromegaly present.  Cardiovascular: Normal rate, regular rhythm, normal heart sounds and intact distal pulses.  Exam reveals no gallop.   No murmur heard. Pulmonary/Chest: Effort normal and breath sounds normal. No respiratory distress. He has no wheezes. He has no rales. He exhibits no tenderness.  Lymphadenopathy:    He has cervical adenopathy.  Neurological: He is alert and oriented to person, place, and time.  Skin: Skin is warm and dry. No rash noted. He is not diaphoretic. No erythema. No pallor.  Psychiatric: He has a normal mood and affect. His behavior is normal. Judgment and thought content normal.  Vitals reviewed.      Assessment & Plan:  1. Acute  maxillary sinusitis, recurrence not specified - He looks ill and since he is leaving for a cross country trip, will treat for upper respiratory infection.  - doxycycline (VIBRAMYCIN) 100 MG capsule; Take 1 capsule (100 mg total) by mouth 2 (two) times daily.  Dispense: 14 capsule; Refill: 0 - Afrin for take off and landing - warned about rebound  - Flonase - Mucinex for cough.

## 2015-06-15 NOTE — Patient Instructions (Addendum)
I have sent in a prescription for Doxycycline, take this twice a day for seven days.   Also, get Afrin or sudafed before your trip to help with the sinus congestion during the airplane ride.  Flonase will also be good to help with the sinus pain and pressure.   Mucinex will help with the cough and chest congestion.   Let me know if you need anything else.   Sinusitis, Adult Sinusitis is redness, soreness, and inflammation of the paranasal sinuses. Paranasal sinuses are air pockets within the bones of your face. They are located beneath your eyes, in the middle of your forehead, and above your eyes. In healthy paranasal sinuses, mucus is able to drain out, and air is able to circulate through them by way of your nose. However, when your paranasal sinuses are inflamed, mucus and air can become trapped. This can allow bacteria and other germs to grow and cause infection. Sinusitis can develop quickly and last only a short time (acute) or continue over a long period (chronic). Sinusitis that lasts for more than 12 weeks is considered chronic. CAUSES Causes of sinusitis include:  Allergies.  Structural abnormalities, such as displacement of the cartilage that separates your nostrils (deviated septum), which can decrease the air flow through your nose and sinuses and affect sinus drainage.  Functional abnormalities, such as when the small hairs (cilia) that line your sinuses and help remove mucus do not work properly or are not present. SIGNS AND SYMPTOMS Symptoms of acute and chronic sinusitis are the same. The primary symptoms are pain and pressure around the affected sinuses. Other symptoms include:  Upper toothache.  Earache.  Headache.  Bad breath.  Decreased sense of smell and taste.  A cough, which worsens when you are lying flat.  Fatigue.  Fever.  Thick drainage from your nose, which often is green and may contain pus (purulent).  Swelling and warmth over the affected  sinuses. DIAGNOSIS Your health care provider will perform a physical exam. During your exam, your health care provider may perform any of the following to help determine if you have acute sinusitis or chronic sinusitis:  Look in your nose for signs of abnormal growths in your nostrils (nasal polyps).  Tap over the affected sinus to check for signs of infection.  View the inside of your sinuses using an imaging device that has a light attached (endoscope). If your health care provider suspects that you have chronic sinusitis, one or more of the following tests may be recommended:  Allergy tests.  Nasal culture. A sample of mucus is taken from your nose, sent to a lab, and screened for bacteria.  Nasal cytology. A sample of mucus is taken from your nose and examined by your health care provider to determine if your sinusitis is related to an allergy. TREATMENT Most cases of acute sinusitis are related to a viral infection and will resolve on their own within 10 days. Sometimes, medicines are prescribed to help relieve symptoms of both acute and chronic sinusitis. These may include pain medicines, decongestants, nasal steroid sprays, or saline sprays. However, for sinusitis related to a bacterial infection, your health care provider will prescribe antibiotic medicines. These are medicines that will help kill the bacteria causing the infection. Rarely, sinusitis is caused by a fungal infection. In these cases, your health care provider will prescribe antifungal medicine. For some cases of chronic sinusitis, surgery is needed. Generally, these are cases in which sinusitis recurs more than 3 times per  year, despite other treatments. HOME CARE INSTRUCTIONS  Drink plenty of water. Water helps thin the mucus so your sinuses can drain more easily.  Use a humidifier.  Inhale steam 3-4 times a day (for example, sit in the bathroom with the shower running).  Apply a warm, moist washcloth to your face  3-4 times a day, or as directed by your health care provider.  Use saline nasal sprays to help moisten and clean your sinuses.  Take medicines only as directed by your health care provider.  If you were prescribed either an antibiotic or antifungal medicine, finish it all even if you start to feel better. SEEK IMMEDIATE MEDICAL CARE IF:  You have increasing pain or severe headaches.  You have nausea, vomiting, or drowsiness.  You have swelling around your face.  You have vision problems.  You have a stiff neck.  You have difficulty breathing.   This information is not intended to replace advice given to you by your health care provider. Make sure you discuss any questions you have with your health care provider.   Document Released: 08/11/2005 Document Revised: 09/01/2014 Document Reviewed: 08/26/2011 Elsevier Interactive Patient Education [employer].

## 2015-07-16 DIAGNOSIS — M65342 Trigger finger, left ring finger: Secondary | ICD-10-CM | POA: Insufficient documentation

## 2015-07-16 DIAGNOSIS — M65351 Trigger finger, right little finger: Secondary | ICD-10-CM | POA: Diagnosis not present

## 2015-09-06 DIAGNOSIS — H401131 Primary open-angle glaucoma, bilateral, mild stage: Secondary | ICD-10-CM | POA: Diagnosis not present

## 2015-09-10 ENCOUNTER — Other Ambulatory Visit: Payer: Medicare Other

## 2015-09-14 ENCOUNTER — Encounter: Payer: Self-pay | Admitting: Adult Health

## 2015-09-14 ENCOUNTER — Ambulatory Visit (INDEPENDENT_AMBULATORY_CARE_PROVIDER_SITE_OTHER): Payer: Medicare Other | Admitting: Adult Health

## 2015-09-14 VITALS — BP 128/82 | Temp 97.7°F | Ht 68.0 in | Wt 162.4 lb

## 2015-09-14 DIAGNOSIS — E785 Hyperlipidemia, unspecified: Secondary | ICD-10-CM | POA: Diagnosis not present

## 2015-09-14 DIAGNOSIS — M791 Myalgia, unspecified site: Secondary | ICD-10-CM

## 2015-09-14 DIAGNOSIS — K219 Gastro-esophageal reflux disease without esophagitis: Secondary | ICD-10-CM

## 2015-09-14 DIAGNOSIS — I10 Essential (primary) hypertension: Secondary | ICD-10-CM | POA: Diagnosis not present

## 2015-09-14 DIAGNOSIS — Z0000 Encounter for general adult medical examination without abnormal findings: Secondary | ICD-10-CM

## 2015-09-14 DIAGNOSIS — Z760 Encounter for issue of repeat prescription: Secondary | ICD-10-CM

## 2015-09-14 LAB — LIPID PANEL
Cholesterol: 301 mg/dL — ABNORMAL HIGH (ref 0–200)
HDL: 48.6 mg/dL (ref >39.00)
LDL Cholesterol: 218 mg/dL — ABNORMAL HIGH (ref 0–99)
NonHDL: 252.51
Total CHOL/HDL Ratio: 6
Triglycerides: 174 mg/dL — ABNORMAL HIGH (ref 0.0–149.0)
VLDL: 34.8 mg/dL (ref 0.0–40.0)

## 2015-09-14 LAB — CBC WITH DIFFERENTIAL/PLATELET
Basophils Absolute: 0 10*3/uL (ref 0.0–0.1)
Basophils Relative: 0.3 % (ref 0.0–3.0)
Eosinophils Absolute: 0.1 10*3/uL (ref 0.0–0.7)
Eosinophils Relative: 1.1 % (ref 0.0–5.0)
HCT: 44.9 % (ref 39.0–52.0)
Hemoglobin: 14.8 g/dL (ref 13.0–17.0)
Lymphocytes Relative: 27.8 % (ref 12.0–46.0)
Lymphs Abs: 1.9 10*3/uL (ref 0.7–4.0)
MCHC: 32.8 g/dL (ref 30.0–36.0)
MCV: 89.7 fl (ref 78.0–100.0)
Monocytes Absolute: 0.5 10*3/uL (ref 0.1–1.0)
Monocytes Relative: 6.9 % (ref 3.0–12.0)
Neutro Abs: 4.5 10*3/uL (ref 1.4–7.7)
Neutrophils Relative %: 63.9 % (ref 43.0–77.0)
Platelets: 296 10*3/uL (ref 150.0–400.0)
RBC: 5.01 Mil/uL (ref 4.22–5.81)
RDW: 13.9 % (ref 11.5–15.5)
WBC: 7 10*3/uL (ref 4.0–10.5)

## 2015-09-14 LAB — POCT URINALYSIS DIPSTICK
Bilirubin, UA: NEGATIVE
Glucose, UA: NEGATIVE
Ketones, UA: NEGATIVE
Leukocytes, UA: NEGATIVE
Nitrite, UA: NEGATIVE
Spec Grav, UA: 1.015
Urobilinogen, UA: 0.2
pH, UA: 5.5

## 2015-09-14 LAB — TSH: TSH: 1.13 u[IU]/mL (ref 0.35–4.50)

## 2015-09-14 LAB — HEPATIC FUNCTION PANEL
ALT: 13 U/L (ref 0–53)
AST: 17 U/L (ref 0–37)
Albumin: 4 g/dL (ref 3.5–5.2)
Alkaline Phosphatase: 62 U/L (ref 39–117)
Bilirubin, Direct: 0.1 mg/dL (ref 0.0–0.3)
Total Bilirubin: 0.6 mg/dL (ref 0.2–1.2)
Total Protein: 6.7 g/dL (ref 6.0–8.3)

## 2015-09-14 LAB — BASIC METABOLIC PANEL
BUN: 14 mg/dL (ref 6–23)
CO2: 27 mEq/L (ref 19–32)
Calcium: 9.6 mg/dL (ref 8.4–10.5)
Chloride: 105 mEq/L (ref 96–112)
Creatinine, Ser: 1.19 mg/dL (ref 0.40–1.50)
GFR: 62.78 mL/min (ref >60.00)
Glucose, Bld: 95 mg/dL (ref 70–99)
Potassium: 5.3 mEq/L — ABNORMAL HIGH (ref 3.5–5.1)
Sodium: 141 mEq/L (ref 135–145)

## 2015-09-14 MED ORDER — ATENOLOL 25 MG PO TABS: 25.0000 mg | ORAL_TABLET | Freq: Every day | ORAL | Status: DC

## 2015-09-14 MED ORDER — LORATADINE 10 MG PO TABS: 10.0000 mg | ORAL_TABLET | Freq: Every day | ORAL | Status: DC

## 2015-09-14 MED ORDER — OMEPRAZOLE 40 MG PO CPDR: 40.0000 mg | DELAYED_RELEASE_CAPSULE | Freq: Every day | ORAL | Status: DC

## 2015-09-14 NOTE — Progress Notes (Signed)
Pre visit review using our clinic review tool, if applicable. No additional management support is needed unless otherwise documented below in the visit note. 

## 2015-09-14 NOTE — Progress Notes (Signed)
Subjective:  Patient presents today for their annual wellness visit.  He is a pleasant caucasian male who  has a past medical history of GERD (gastroesophageal reflux disease); Seasonal allergies; Arthritis; Wears glasses; Hyperlipemia; HOH (hard of hearing); Wears hearing aid; Anxiety; Essential tremor; TIA (transient ischemic attack) (04/2015); Glaucoma; and Back pain.   Preventive Screening-Counseling & Management  Smoking Status: Never Smoker Second Hand Smoking status: No smokers in home  Risk Factors Regular exercise: He does aerobic exercise multiple times a week Diet: Eats a healthy diet Fall Risk: None   Cardiac risk factors:  advanced age (older than 84 for men, 62 for women) Hyperlipidemia  No diabetes.  Family History:  Depression Screen None. PHQ2 0   Activities of Daily Living Independent ADLs and IADLs   Hearing Difficulties: He wears hearing aids. Does not have them in today.   Cognitive Testing No reported trouble.   Normal 3 word recall  List the Names of Other Physician/Practitioners you currently use: 1.Kuzma- Hand 2. [facility] _ eye 3. Randol Kern - Dentist 4. [facility]  Immunization History  Administered Date(s) Administered  . Influenza Split 09/03/2011  . Influenza Whole 08/01/2010  . Influenza, High Dose Seasonal PF 06/14/2013, 06/13/2015  . Influenza, Seasonal, Injecte, Preservative Fre 09/06/2012  . Influenza,inj,Quad PF,36+ Mos 05/29/2014  . Pneumococcal Conjugate-13 09/08/2013  . Pneumococcal Polysaccharide-23 07/25/2009  . Zoster 09/06/2008   Required Immunizations needed today None, he is up to date  Screening tests- up to date There are no preventive care reminders to display for this patient.  ROS-   He continues to complain of muscle cramps. He stopped taking Crestor in October due to this. He endorses that the muscle cramps stopped but have since come back. He has tried stretching exercises as well as CoQ10, but  mustard works the best. He is staying hydated.   The following were reviewed and entered/updated in epic: Past Medical History  Diagnosis Date  . GERD (gastroesophageal reflux disease)   . Seasonal allergies   . Arthritis   . Wears glasses   . Hyperlipemia   . HOH (hard of hearing)   . Wears hearing aid   . Anxiety   . Essential tremor   . TIA (transient ischemic attack) 04/2015  . Glaucoma   . Back pain    Patient Active Problem List   Diagnosis Date Noted  . Essential hypertension, benign 12/07/2014  . Muscle cramps 03/14/2014  . Allergic rhinitis, seasonal 04/06/2012  . PERSONAL HX COLONIC POLYPS 08/06/2010  . GERD 11/05/2009  . RESTLESS LEG SYNDROME 10/04/2009  . Hyperlipidemia 08/23/2009  . ANXIETY DISORDER, GENERALIZED 08/23/2009  . TREMOR, ESSENTIAL 08/23/2009  . 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 Eye surgery      both cataracts  . Cervical fusion  2007  . Knee arthroscopy      left  . Appendectomy    . Tonsillectomy    . Hemorrhoid surgery    . Colonoscopy    . Upper gastrointestinal endoscopy    . Trigger finger release  06/08/2012    Procedure: RELEASE TRIGGER FINGER/A-1 PULLEY;  Surgeon: Wynonia Sours, MD;  Location: [place];  Service: Orthopedics;  Laterality: Left;  Release A-1 Pulley Left Long Finger  . Trigger finger release  07/14/2012    Procedure: RELEASE TRIGGER FINGER/A-1 PULLEY;  Surgeon: Wynonia Sours, MD;  Location: [place];  Service: Orthopedics;  Laterality:  Right;  . Nasal septoplasty w/ turbinoplasty Bilateral 02/15/2013    Procedure: NASAL SEPTOPLASTY WITH BILATER TURBINATE REDUCTION;  Surgeon: Ascencion Dike, MD;  Location: [place];  Service: ENT;  Laterality: Bilateral;  . Carpal tunnel release Right 10/12/2013    Procedure: RIGHT CARPAL TUNNEL RELEASE;  Surgeon: Wynonia Sours, MD;  Location: [place];  Service:  Orthopedics;  Laterality: Right;  . Trigger finger release Right 10/12/2013    Procedure: RELEASE TRIGGER FINGER/A-1 PULLEY RIGHT MIDDLE FINGER;  Surgeon: Wynonia Sours, MD;  Location: [place];  Service: Orthopedics;  Laterality: Right;    Family History  Problem Relation Age of Onset  . Dementia Father   . Heart failure Father   . Glaucoma Mother     Medications- reviewed and updated Current Outpatient Prescriptions  Medication Sig Dispense Refill  . atenolol (TENORMIN) 25 MG tablet Take 1 tablet (25 mg total) by mouth daily. 90 tablet 3  . latanoprost (XALATAN) 0.005 % ophthalmic solution Place 1 drop into both eyes at bedtime.     Marland Kitchen loratadine (CLARITIN) 10 MG tablet Take 10 mg by mouth daily.    Marland Kitchen LORazepam (ATIVAN) 0.5 MG tablet One half to one tablet each bedtime when necessary (Patient taking differently: Take 0.25 mg by mouth daily as needed for anxiety or sleep. One half to one tablet each bedtime when necessary) 100 tablet 3  . omeprazole (PRILOSEC) 40 MG capsule Take 1 capsule (40 mg total) by mouth daily. (Patient taking differently: Take 40 mg by mouth 2 (two) times daily. ) 100 capsule 3  . rosuvastatin (CRESTOR) 20 MG tablet Take 1 tablet (20 mg total) by mouth daily. (Patient not taking: Reported on 09/14/2015) 90 tablet 3   No current facility-administered medications for this visit.    Allergies-reviewed and updated No Known Allergies  Social History   Social History  . Marital Status: Married    Spouse Name: N/A  . Number of Children: N/A  . Years of Education: N/A   Social History Main Topics  . Smoking status: Former Smoker    Quit date: 06/03/1984  . Smokeless tobacco: Former Systems developer    Quit date: 10/07/1986  . Alcohol Use: No  . Drug Use: No  . Sexual Activity: Not Asked   Other Topics Concern  . None   Social History Narrative   Retired from [employer]   Married    4 children, one son lives locally    Objective: BP 128/82  mmHg  Temp(Src) 97.7 F (36.5 C) (Oral)  Ht 5\' 8"  (1.727 m)  Wt 162 lb 6.4 oz (73.664 kg)  BMI 24.70 kg/m2 GENERAL: vitals reviewed and listed above, alert, oriented, appears well hydrated and in no acute distress  HEENT: atraumatic, conjunttiva clear, no obvious abnormalities on inspection of external nose and ears. TM's visualized. No cerumen impaction  NECK: Neck is soft and supple without masses, no adenopathy or thyromegaly, trachea midline, no JVD. Normal range of motion.   LUNGS: clear to auscultation bilaterally, no wheezes, rales or rhonchi, good air movement  CV: Regular rate and rhythm, normal S1/S2, no audible murmurs, gallops, or rubs. No carotid bruit and no peripheral edema.   MS: moves all extremities without noticeable abnormality. No edema noted  Abd: soft/nontender/nondistended/normal bowel sounds   Skin: warm and dry, no rash   Extremities: No clubbing, cyanosis, or edema. Capillary refill is WNL. Pulses intact bilaterally in upper and lower extremities.  Neuro: CN II-XII intact, sensation and reflexes normal throughout, 5/5 muscle strength in bilateral upper and lower extremities. Normal finger to nose. Normal rapid alternating movements. Normal romberg. No pronator drift.   PSYCH: pleasant and cooperative, no obvious depression or anxiety  Assessment/Plan:  1. Essential hypertension, benign - atenolol (TENORMIN) 25 MG tablet; Take 1 tablet (25 mg total) by mouth daily.  Dispense: 90 tablet; Refill: 3 - Basic metabolic panel - CBC with Differential/Platelet - Hepatic function panel - Lipid panel - POCT urinalysis dipstick - TSH - Controlled with this therapy, no change 2. Hyperlipidemia - Basic metabolic panel - CBC with Differential/Platelet - Hepatic function panel - Lipid panel - POCT urinalysis dipstick - TSH - Will refer to lipid clinic depending on what labs show.  - Can add fish oil to daily regimen.  - Continue to eat healthy and  exercise.  3. Gastroesophageal reflux disease without esophagitis - omeprazole (PRILOSEC) 40 MG capsule; Take 1 capsule (40 mg total) by mouth daily.  Dispense: 90 capsule; Refill: 3  4. Encounter for Medicare annual wellness exam - Follow up in one year for next MWE - Follow up sooner if needed  5. Myalgia - Likely from past statin use. - Will check Mag and BMP - Supplement if necessary   6. Medication refill  - omeprazole (PRILOSEC) 40 MG capsule; Take 1 capsule (40 mg total) by mouth daily.  Dispense: 90 capsule; Refill: 3 - loratadine (CLARITIN) 10 MG tablet; Take 1 tablet (10 mg total) by mouth daily.  Dispense: 90 tablet; Refill: 3 - atenolol (TENORMIN) 25 MG tablet; Take 1 tablet (25 mg total) by mouth daily.  Dispense: 90 tablet; Refill: 3  Return precautions advised.   Dorothyann Peng, AGNP

## 2015-09-14 NOTE — Patient Instructions (Addendum)
It was great seeing you again!  I will follow up with ou regarding your lab work.   Follow up with me in one year or sooner if needed  Health Maintenance, Male A healthy lifestyle and preventative care can promote health and wellness.  Maintain regular health, dental, and eye exams.  Eat a healthy diet. Foods like vegetables, fruits, whole grains, low-fat dairy products, and lean protein foods contain the nutrients you need and are low in calories. Decrease your intake of foods high in solid fats, added sugars, and salt. Get information about a proper diet from your health care provider, if necessary.  Regular physical exercise is one of the most important things you can do for your health. Most adults should get at least 150 minutes of moderate-intensity exercise (any activity that increases your heart rate and causes you to sweat) each week. In addition, most adults need muscle-strengthening exercises on 2 or more days a week.   Maintain a healthy weight. The body mass index (BMI) is a screening tool to identify possible weight problems. It provides an estimate of body fat based on height and weight. Your health care provider can find your BMI and can help you achieve or maintain a healthy weight. For males 20 years and older:  A BMI below 18.5 is considered underweight.  A BMI of 18.5 to 24.9 is normal.  A BMI of 25 to 29.9 is considered overweight.  A BMI of 30 and above is considered obese.  Maintain normal blood lipids and cholesterol by exercising and minimizing your intake of saturated fat. Eat a balanced diet with plenty of fruits and vegetables. Blood tests for lipids and cholesterol should begin at age 62 and be repeated every 5 years. If your lipid or cholesterol levels are high, you are over age 60, or you are at high risk for heart disease, you may need your cholesterol levels checked more frequently.Ongoing high lipid and cholesterol levels should be treated with medicines  if diet and exercise are not working.  If you smoke, find out from your health care provider how to quit. If you do not use tobacco, do not start.  Lung cancer screening is recommended for adults aged 32-80 years who are at high risk for developing lung cancer because of a history of smoking. A yearly low-dose CT scan of the lungs is recommended for people who have at least a 30-pack-year history of smoking and are current smokers or have quit within the past 15 years. A pack year of smoking is smoking an average of 1 pack of cigarettes a day for 1 year (for example, a 30-pack-year history of smoking could mean smoking 1 pack a day for 30 years or 2 packs a day for 15 years). Yearly screening should continue until the smoker has stopped smoking for at least 15 years. Yearly screening should be stopped for people who develop a health problem that would prevent them from having lung cancer treatment.  If you choose to drink alcohol, do not have more than 2 drinks per day. One drink is considered to be 12 oz (360 mL) of beer, 5 oz (150 mL) of wine, or 1.5 oz (45 mL) of liquor.  Avoid the use of street drugs. Do not share needles with anyone. Ask for help if you need support or instructions about stopping the use of drugs.  High blood pressure causes heart disease and increases the risk of stroke. High blood pressure is more likely to develop  in:  People who have blood pressure in the end of the normal range (100-139/85-89 mm Hg).  People who are overweight or obese.  People who are African American.  If you are 64-63 years of age, have your blood pressure checked every 3-5 years. If you are 52 years of age or older, have your blood pressure checked every year. You should have your blood pressure measured twice--once when you are at a hospital or clinic, and once when you are not at a hospital or clinic. Record the average of the two measurements. To check your blood pressure when you are not at a  hospital or clinic, you can use:  An automated blood pressure machine at a pharmacy.  A home blood pressure monitor.  If you are 12-45 years old, ask your health care provider if you should take aspirin to prevent heart disease.  Diabetes screening involves taking a blood sample to check your fasting blood sugar level. This should be done once every 3 years after age 54 if you are at a normal weight and without risk factors for diabetes. Testing should be considered at a younger age or be carried out more frequently if you are overweight and have at least 1 risk factor for diabetes.  Colorectal cancer can be detected and often prevented. Most routine colorectal cancer screening begins at the age of 65 and continues through age 57. However, your health care provider may recommend screening at an earlier age if you have risk factors for colon cancer. On a yearly basis, your health care provider may provide home test kits to check for hidden blood in the stool. A small camera at the end of a tube may be used to directly examine the colon (sigmoidoscopy or colonoscopy) to detect the earliest forms of colorectal cancer. Talk to your health care provider about this at age 57 when routine screening begins. A direct exam of the colon should be repeated every 5-10 years through age 55, unless early forms of precancerous polyps or small growths are found.  People who are at an increased risk for hepatitis B should be screened for this virus. You are considered at high risk for hepatitis B if:  You were born in a country where hepatitis B occurs often. Talk with your health care provider about which countries are considered high risk.  Your parents were born in a high-risk country and you have not received a shot to protect against hepatitis B (hepatitis B vaccine).  You have HIV or AIDS.  You use needles to inject street drugs.  You live with, or have sex with, someone who has hepatitis B.  You are a  man who has sex with other men (MSM).  You get hemodialysis treatment.  You take certain medicines for conditions like cancer, organ transplantation, and autoimmune conditions.  Hepatitis C blood testing is recommended for all people born from [birth year] through 1963 and any individual with known risk factors for hepatitis C.  Healthy men should no longer receive prostate-specific antigen (PSA) blood tests as part of routine cancer screening. Talk to your health care provider about prostate cancer screening.  Testicular cancer screening is not recommended for adolescents or adult males who have no symptoms. Screening includes self-exam, a health care provider exam, and other screening tests. Consult with your health care provider about any symptoms you have or any concerns you have about testicular cancer.  Practice safe sex. Use condoms and avoid high-risk sexual practices to reduce the  spread of sexually transmitted infections (STIs).  You should be screened for STIs, including gonorrhea and chlamydia if:  You are sexually active and are younger than 24 years.  You are older than 24 years, and your health care provider tells you that you are at risk for this type of infection.  Your sexual activity has changed since you were last screened, and you are at an increased risk for chlamydia or gonorrhea. Ask your health care provider if you are at risk.  If you are at risk of being infected with HIV, it is recommended that you take a prescription medicine daily to prevent HIV infection. This is called pre-exposure prophylaxis (PrEP). You are considered at risk if:  You are a man who has sex with other men (MSM).  You are a heterosexual man who is sexually active with multiple partners.  You take drugs by injection.  You are sexually active with a partner who has HIV.  Talk with your health care provider about whether you are at high risk of being infected with HIV. If you choose to begin PrEP,  you should first be tested for HIV. You should then be tested every 3 months for as long as you are taking PrEP.  Use sunscreen. Apply sunscreen liberally and repeatedly throughout the day. You should seek shade when your shadow is shorter than you. Protect yourself by wearing long sleeves, pants, a wide-brimmed hat, and sunglasses year round whenever you are outdoors.  Tell your health care provider of new moles or changes in moles, especially if there is a change in shape or color. Also, tell your health care provider if a mole is larger than the size of a pencil eraser.  A one-time screening for abdominal aortic aneurysm (AAA) and surgical repair of large AAAs by ultrasound is recommended for men aged 8-75 years who are current or former smokers.  Stay current with your vaccines (immunizations).   This information is not intended to replace advice given to you by your health care provider. Make sure you discuss any questions you have with your health care provider.   Document Released: 02/07/2008 Document Revised: 09/01/2014 Document Reviewed: 01/06/2011 Elsevier Interactive Patient Education [employer].

## 2015-09-17 ENCOUNTER — Encounter: Payer: Self-pay | Admitting: Gastroenterology

## 2015-09-17 ENCOUNTER — Encounter: Payer: Medicare Other | Admitting: Family Medicine

## 2015-09-17 NOTE — Addendum Note (Signed)
Addended by: Colleen Can on: 09/17/2015 12:02 PM   Modules accepted: Orders

## 2015-09-20 ENCOUNTER — Ambulatory Visit: Payer: Medicare Other | Admitting: Pharmacist

## 2015-09-27 ENCOUNTER — Telehealth: Payer: Self-pay | Admitting: Adult Health

## 2015-09-27 NOTE — Telephone Encounter (Signed)
Pt would like a call back about his appt with Dr Percival Spanish

## 2015-09-27 NOTE — Telephone Encounter (Signed)
Called and spoke with pt and pt states that he will not being going to the [facility] because the shots will cost 14,000 a year and it is not guaranteed to work.  Pt states he would like to try changing his diet and possibly going on a non-statin drug or going back on the medication he was on at a lower dose.  Pt states he is still having cramps and is not sure if it was even coming from the medication. Pls advise.

## 2015-09-28 MED ORDER — ROSUVASTATIN CALCIUM 20 MG PO TABS: 20.0000 mg | ORAL_TABLET | Freq: Every day | ORAL | Status: DC

## 2015-09-28 NOTE — Addendum Note (Signed)
Addended by: Colleen Can on: 09/28/2015 05:01 PM   Modules accepted: Orders

## 2015-09-28 NOTE — Telephone Encounter (Signed)
Called and spoke with pt and pt is aware of recommendations. Pt states he woke up this morning and had cramps in both legs; and the cramps are return to the way they were before.  Pt states he does not have Crestor but he does have CoQ10. Ok per [place] [facility] to refill medication. Pt would like to pick up a prescription to take to the [place]. Rx is ready for pick up.

## 2015-09-28 NOTE — Telephone Encounter (Signed)
He can try taking Crestor every other day and take it with CoQ10, or we can switch statin medications.   Current research shows that in people who are intolerant to statin medications, that it is not recommended they go on a non-statin drug as there is a high increase in non-cardiovascular mortality.

## 2015-10-12 ENCOUNTER — Ambulatory Visit: Payer: Medicare Other | Admitting: Cardiology

## 2015-10-22 ENCOUNTER — Ambulatory Visit: Payer: Medicare Other | Admitting: Cardiology

## 2015-11-01 ENCOUNTER — Encounter: Payer: Self-pay | Admitting: Gastroenterology

## 2015-12-10 DIAGNOSIS — H401131 Primary open-angle glaucoma, bilateral, mild stage: Secondary | ICD-10-CM | POA: Diagnosis not present

## 2015-12-18 ENCOUNTER — Ambulatory Visit (AMBULATORY_SURGERY_CENTER): Payer: Self-pay

## 2015-12-18 VITALS — Ht 69.0 in | Wt 155.8 lb

## 2015-12-18 DIAGNOSIS — Z860100 Personal history of colon polyps, unspecified: Secondary | ICD-10-CM

## 2015-12-18 MED ORDER — SUPREP BOWEL PREP KIT 17.5-3.13-1.6 GM/177ML PO SOLN: 1.0000 | Freq: Once | ORAL | Status: DC

## 2015-12-18 NOTE — Progress Notes (Signed)
No allergies to eggs or soy No past problems with anesthesia No diet/weight loss meds No home oxygen  Refused emmi 

## 2015-12-25 DIAGNOSIS — S83241A Other tear of medial meniscus, current injury, right knee, initial encounter: Secondary | ICD-10-CM | POA: Diagnosis not present

## 2016-01-01 ENCOUNTER — Ambulatory Visit (AMBULATORY_SURGERY_CENTER): Payer: Medicare Other | Admitting: Gastroenterology

## 2016-01-01 ENCOUNTER — Encounter: Payer: Self-pay | Admitting: Gastroenterology

## 2016-01-01 VITALS — BP 98/66 | HR 65 | Temp 97.8°F | Resp 18 | Ht 69.0 in | Wt 155.0 lb

## 2016-01-01 DIAGNOSIS — I10 Essential (primary) hypertension: Secondary | ICD-10-CM | POA: Diagnosis not present

## 2016-01-01 DIAGNOSIS — Z8673 Personal history of transient ischemic attack (TIA), and cerebral infarction without residual deficits: Secondary | ICD-10-CM | POA: Diagnosis not present

## 2016-01-01 DIAGNOSIS — F419 Anxiety disorder, unspecified: Secondary | ICD-10-CM | POA: Diagnosis not present

## 2016-01-01 DIAGNOSIS — Z86010 Personal history of colonic polyps: Secondary | ICD-10-CM | POA: Diagnosis not present

## 2016-01-01 MED ORDER — SODIUM CHLORIDE 0.9 % IV SOLN: 500.0000 mL | INTRAVENOUS | Status: DC

## 2016-01-01 NOTE — Patient Instructions (Signed)
YOU HAD AN ENDOSCOPIC PROCEDURE TODAY AT [facility]:   Refer to the procedure report that was given to you for any specific questions about what was found during the examination.  If the procedure report does not answer your questions, please call your gastroenterologist to clarify.  If you requested that your care partner not be given the details of your procedure findings, then the procedure report has been included in a sealed envelope for you to review at your convenience later.  YOU SHOULD EXPECT: Some feelings of bloating in the abdomen. Passage of more gas than usual.  Walking can help get rid of the air that was put into your GI tract during the procedure and reduce the bloating. If you had a lower endoscopy (such as a colonoscopy or flexible sigmoidoscopy) you may notice spotting of blood in your stool or on the toilet paper. If you underwent a bowel prep for your procedure, you may not have a normal bowel movement for a few days.  Please Note:  You might notice some irritation and congestion in your nose or some drainage.  This is from the oxygen used during your procedure.  There is no need for concern and it should clear up in a day or so.  SYMPTOMS TO REPORT IMMEDIATELY:   Following lower endoscopy (colonoscopy or flexible sigmoidoscopy):  Excessive amounts of blood in the stool  Significant tenderness or worsening of abdominal pains  Swelling of the abdomen that is new, acute  Fever of 100F or higher   For urgent or emergent issues, a gastroenterologist can be reached at any hour by calling [phone].   DIET: Your first meal following the procedure should be a small meal and then it is ok to progress to your normal diet. Heavy or fried foods are harder to digest and may make you feel nauseous or bloated.  Likewise, meals heavy in dairy and vegetables can increase bloating.  Drink plenty of fluids but you should avoid alcoholic beverages for 24 hours.  Try to  increase the fiber in your diet.  ACTIVITY:  You should plan to take it easy for the rest of today and you should NOT DRIVE or use heavy machinery until tomorrow (because of the sedation medicines used during the test).    FOLLOW UP: Our staff will call the number listed on your records the next business day following your procedure to check on you and address any questions or concerns that you may have regarding the information given to you following your procedure. If we do not reach you, we will leave a message.  However, if you are feeling well and you are not experiencing any problems, there is no need to return our call.  We will assume that you have returned to your regular daily activities without incident.  If any biopsies were taken you will be contacted by phone or by letter within the next 1-3 weeks.  Please call us at [phone] if you have not heard about the biopsies in 3 weeks.    SIGNATURES/CONFIDENTIALITY: You and/or your care partner have signed paperwork which will be entered into your electronic medical record.  These signatures attest to the fact that that the information above on your After Visit Summary has been reviewed and is understood.  Full responsibility of the confidentiality of this discharge information lies with you and/or your care-partner.  You will not need another colonoscopy due to your age.    Please, read  all of the handouts given to you by your recovery room nurse.  Thank-you for choosing [name] for your healthcare needs today.

## 2016-01-01 NOTE — Op Note (Signed)
[facility] Patient Name: Jason Farmer Procedure Date: 01/01/2016 9:52 AM MRN: [id number] Endoscopist: Ladene Artist , MD Age: 81 Date of Birth: [birth year] Gender: Male Procedure:                Colonoscopy Indications:              Surveillance: Personal history of adenomatous                            polyps on last colonoscopy > 5 years ago Medicines:                Monitored Anesthesia Care Procedure:                Pre-Anesthesia Assessment:                           - Prior to the procedure, a History and Physical                            was performed, and patient medications and                            allergies were reviewed. The patient's tolerance of                            previous anesthesia was also reviewed. The risks                            and benefits of the procedure and the sedation                            options and risks were discussed with the patient.                            All questions were answered, and informed consent                            was obtained. Prior Anticoagulants: The patient has                            taken no previous anticoagulant or antiplatelet                            agents. ASA Grade Assessment: II - A patient with                            mild systemic disease. After reviewing the risks                            and benefits, the patient was deemed in                            satisfactory condition to undergo the procedure.  After obtaining informed consent, the colonoscope                            was passed under direct vision. Throughout the                            procedure, the patient's blood pressure, pulse, and                            oxygen saturations were monitored continuously. The                            Model PCF-H190DL [id number]) scope was introduced                            through the anus and advanced to the the cecum,                identified by appendiceal orifice and ileocecal                            valve. The colonoscopy was performed without                            difficulty. The patient tolerated the procedure                            well. The quality of the bowel preparation was                            excellent. The ileocecal valve, appendiceal                            orifice, and rectum were photographed. Scope In: 10:12:10 AM Scope Out: 10:23:50 AM Scope Withdrawal Time: 0 hours 8 minutes 19 seconds  Total Procedure Duration: 0 hours 11 minutes 40 seconds  Findings:                 The digital rectal exam was normal.                           Many small-mouthed diverticula were found in the                            sigmoid colon. There was evidence of diverticular                            spasm. There was no evidence of diverticular                            bleeding.                           The exam was otherwise normal throughout the  examined colon.                           The retroflexed view of the distal rectum and anal                            verge was normal and showed no anal or rectal                            abnormalities. Complications:            No immediate complications. Estimated Blood Loss:     Estimated blood loss: none. Impression:               - Moderate diverticulosis in the sigmoid colon.                           - Otherwise normal colonosocpy Recommendation:           - Patient has a contact number available for                            emergencies. The signs and symptoms of potential                            delayed complications were discussed with the                            patient. Return to normal activities tomorrow.                            Written discharge instructions were provided to the                            patient.                           - High fiber diet indefinitely.                            - Continue present medications.                           - No repeat colonoscopy due to age. Ladene Artist, MD 01/01/2016 10:27:21 AM This report has been signed electronically.

## 2016-01-01 NOTE — Progress Notes (Signed)
TO Pacu Awake and alert  Report to RN

## 2016-01-02 ENCOUNTER — Telehealth: Payer: Self-pay | Admitting: *Deleted

## 2016-01-02 NOTE — Telephone Encounter (Signed)
  Follow up Call-  Call back number 01/01/2016  Post procedure Call Back phone  # [phone]  Permission to leave phone message Yes     Patient questions:  Do you have a fever, pain , or abdominal swelling? No. Pain Score  0 *  Have you tolerated food without any problems? Yes.    Have you been able to return to your normal activities? Yes.    Do you have any questions about your discharge instructions: Diet   No. Medications  No. Follow up visit  No.  Do you have questions or concerns about your Care? No.  Actions: * If pain score is 4 or above: No action needed, pain <4.  Information provided via wife.

## 2016-01-15 ENCOUNTER — Other Ambulatory Visit: Payer: Self-pay | Admitting: Family Medicine

## 2016-01-15 DIAGNOSIS — S83241D Other tear of medial meniscus, current injury, right knee, subsequent encounter: Secondary | ICD-10-CM | POA: Diagnosis not present

## 2016-03-11 DIAGNOSIS — H401131 Primary open-angle glaucoma, bilateral, mild stage: Secondary | ICD-10-CM | POA: Diagnosis not present

## 2016-05-08 ENCOUNTER — Ambulatory Visit (INDEPENDENT_AMBULATORY_CARE_PROVIDER_SITE_OTHER): Payer: Medicare Other

## 2016-05-08 DIAGNOSIS — Z23 Encounter for immunization: Secondary | ICD-10-CM | POA: Diagnosis not present

## 2016-05-12 DIAGNOSIS — M65342 Trigger finger, left ring finger: Secondary | ICD-10-CM | POA: Diagnosis not present

## 2016-05-12 DIAGNOSIS — M65351 Trigger finger, right little finger: Secondary | ICD-10-CM | POA: Diagnosis not present

## 2016-05-12 DIAGNOSIS — M79641 Pain in right hand: Secondary | ICD-10-CM | POA: Insufficient documentation

## 2016-05-12 DIAGNOSIS — G8929 Other chronic pain: Secondary | ICD-10-CM | POA: Insufficient documentation

## 2016-05-12 DIAGNOSIS — M180 Bilateral primary osteoarthritis of first carpometacarpal joints: Secondary | ICD-10-CM | POA: Insufficient documentation

## 2016-05-12 DIAGNOSIS — M25511 Pain in right shoulder: Secondary | ICD-10-CM | POA: Diagnosis not present

## 2016-05-12 DIAGNOSIS — M79642 Pain in left hand: Secondary | ICD-10-CM | POA: Diagnosis not present

## 2016-05-20 DIAGNOSIS — M25511 Pain in right shoulder: Secondary | ICD-10-CM | POA: Diagnosis not present

## 2016-05-20 DIAGNOSIS — M19011 Primary osteoarthritis, right shoulder: Secondary | ICD-10-CM | POA: Diagnosis not present

## 2016-05-20 DIAGNOSIS — G8929 Other chronic pain: Secondary | ICD-10-CM | POA: Diagnosis not present

## 2016-05-20 DIAGNOSIS — Z981 Arthrodesis status: Secondary | ICD-10-CM | POA: Diagnosis not present

## 2016-05-22 ENCOUNTER — Other Ambulatory Visit: Payer: Self-pay | Admitting: Orthopedic Surgery

## 2016-05-22 DIAGNOSIS — M25511 Pain in right shoulder: Secondary | ICD-10-CM

## 2016-05-29 ENCOUNTER — Ambulatory Visit
Admission: RE | Admit: 2016-05-29 | Discharge: 2016-05-29 | Disposition: A | Discharge status: Home / Self Care | Payer: Medicare Other | Source: Ambulatory Visit | Attending: Orthopedic Surgery | Admitting: Orthopedic Surgery

## 2016-05-29 DIAGNOSIS — M25511 Pain in right shoulder: Secondary | ICD-10-CM | POA: Diagnosis not present

## 2016-06-03 DIAGNOSIS — M7581 Other shoulder lesions, right shoulder: Secondary | ICD-10-CM | POA: Diagnosis not present

## 2016-06-03 DIAGNOSIS — M75111 Incomplete rotator cuff tear or rupture of right shoulder, not specified as traumatic: Secondary | ICD-10-CM | POA: Diagnosis not present

## 2016-06-03 DIAGNOSIS — G8929 Other chronic pain: Secondary | ICD-10-CM | POA: Diagnosis not present

## 2016-06-03 DIAGNOSIS — M25511 Pain in right shoulder: Secondary | ICD-10-CM | POA: Diagnosis not present

## 2016-07-10 ENCOUNTER — Encounter: Payer: Self-pay | Admitting: Adult Health

## 2016-07-10 ENCOUNTER — Ambulatory Visit (INDEPENDENT_AMBULATORY_CARE_PROVIDER_SITE_OTHER): Payer: Medicare Other | Admitting: Adult Health

## 2016-07-10 VITALS — BP 140/70 | Temp 97.7°F | Ht 69.0 in | Wt 156.0 lb

## 2016-07-10 DIAGNOSIS — J0110 Acute frontal sinusitis, unspecified: Secondary | ICD-10-CM

## 2016-07-10 DIAGNOSIS — F411 Generalized anxiety disorder: Secondary | ICD-10-CM | POA: Diagnosis not present

## 2016-07-10 MED ORDER — LORAZEPAM 0.5 MG PO TABS: ORAL_TABLET | ORAL | 0 refills | Status: DC

## 2016-07-10 MED ORDER — DOXYCYCLINE HYCLATE 100 MG PO CAPS: 100.0000 mg | ORAL_CAPSULE | Freq: Two times a day (BID) | ORAL | 0 refills | Status: DC

## 2016-07-10 NOTE — Patient Instructions (Signed)
It was great seeing you today  Your exam is consistent with a sinus infection. I have sent in a prescription for Doxycycline. Take this twice a day for 7 days. Also get Flonase at the drug store and use that daily.   Follow up if no improvement in the next 2-3 days    General Recommendations:    Please drink plenty of fluids.  Get plenty of rest   Sleep in humidified air  Use saline nasal sprays  Netti pot   OTC Medications:  Decongestants - helps relieve congestion   Flonase (generic fluticasone) or Nasacort (generic triamcinolone) - please make sure to use the "cross-over" technique at a 45 degree angle towards the opposite eye as opposed to straight up the nasal passageway.   Sudafed (generic pseudoephedrine - Note this is the one that is available behind the pharmacy counter); Products with phenylephrine (-PE) may also be used but is often not as effective as pseudoephedrine.   If you have HIGH BLOOD PRESSURE - Coricidin HBP; AVOID any product that is -D as this contains pseudoephedrine which may increase your blood pressure.  Afrin (oxymetazoline) every 6-8 hours for up to 3 days.   Allergies - helps relieve runny nose, itchy eyes and sneezing   Claritin (generic loratidine), Allegra (fexofenidine), or Zyrtec (generic cyrterizine) for runny nose. These medications should not cause drowsiness.  Note - Benadryl (generic diphenhydramine) may be used however may cause drowsiness  Cough -   Delsym or Robitussin (generic dextromethorphan)  Expectorants - helps loosen mucus to ease removal   Mucinex (generic guaifenesin) as directed on the package.  Headaches / General Aches   Tylenol (generic acetaminophen) - DO NOT EXCEED 3 grams (3,000 mg) in a 24 hour time period  Advil/Motrin (generic ibuprofen)   Sore Throat -   Salt water gargle   Chloraseptic (generic benzocaine) spray or lozenges / Sucrets (generic dyclonine)    Sinusitis Sinusitis is redness,  soreness, and inflammation of the paranasal sinuses. Paranasal sinuses are air pockets within the bones of your face (beneath the eyes, the middle of the forehead, or above the eyes). In healthy paranasal sinuses, mucus is able to drain out, and air is able to circulate through them by way of your nose. However, when your paranasal sinuses are inflamed, mucus and air can become trapped. This can allow bacteria and other germs to grow and cause infection. Sinusitis can develop quickly and last only a short time (acute) or continue over a long period (chronic). Sinusitis that lasts for more than 12 weeks is considered chronic.  CAUSES  Causes of sinusitis include:  Allergies.  Structural abnormalities, such as displacement of the cartilage that separates your nostrils (deviated septum), which can decrease the air flow through your nose and sinuses and affect sinus drainage.  Functional abnormalities, such as when the small hairs (cilia) that line your sinuses and help remove mucus do not work properly or are not present. SIGNS AND SYMPTOMS  Symptoms of acute and chronic sinusitis are the same. The primary symptoms are pain and pressure around the affected sinuses. Other symptoms include:  Upper toothache.  Earache.  Headache.  Bad breath.  Decreased sense of smell and taste.  A cough, which worsens when you are lying flat.  Fatigue.  Fever.  Thick drainage from your nose, which often is green and may contain pus (purulent).  Swelling and warmth over the affected sinuses. DIAGNOSIS  Your health care provider will perform a physical exam. During  the exam, your health care provider may:  Look in your nose for signs of abnormal growths in your nostrils (nasal polyps).  Tap over the affected sinus to check for signs of infection.  View the inside of your sinuses (endoscopy) using an imaging device that has a light attached (endoscope). If your health care provider suspects that you  have chronic sinusitis, one or more of the following tests may be recommended:  Allergy tests.  Nasal culture. A sample of mucus is taken from your nose, sent to a lab, and screened for bacteria.  Nasal cytology. A sample of mucus is taken from your nose and examined by your health care provider to determine if your sinusitis is related to an allergy. TREATMENT  Most cases of acute sinusitis are related to a viral infection and will resolve on their own within 10 days. Sometimes medicines are prescribed to help relieve symptoms (pain medicine, decongestants, nasal steroid sprays, or saline sprays).  However, for sinusitis related to a bacterial infection, your health care provider will prescribe antibiotic medicines. These are medicines that will help kill the bacteria causing the infection.  Rarely, sinusitis is caused by a fungal infection. In theses cases, your health care provider will prescribe antifungal medicine. For some cases of chronic sinusitis, surgery is needed. Generally, these are cases in which sinusitis recurs more than 3 times per year, despite other treatments. HOME CARE INSTRUCTIONS   Drink plenty of water. Water helps thin the mucus so your sinuses can drain more easily.  Use a humidifier.  Inhale steam 3 to 4 times a day (for example, sit in the bathroom with the shower running).  Apply a warm, moist washcloth to your face 3 to 4 times a day, or as directed by your health care provider.  Use saline nasal sprays to help moisten and clean your sinuses.  Take medicines only as directed by your health care provider.  If you were prescribed either an antibiotic or antifungal medicine, finish it all even if you start to feel better. SEEK IMMEDIATE MEDICAL CARE IF:  You have increasing pain or severe headaches.  You have nausea, vomiting, or drowsiness.  You have swelling around your face.  You have vision problems.  You have a stiff neck.  You have difficulty  breathing. MAKE SURE YOU:   Understand these instructions.  Will watch your condition.  Will get help right away if you are not doing well or get worse. Document Released: 08/11/2005 Document Revised: 12/26/2013 Document Reviewed: 08/26/2011 [facility] Patient Information 2015 [place], [place]. This information is not intended to replace advice given to you by your health care provider. Make sure you discuss any questions you have with your health care provider.

## 2016-07-10 NOTE — Progress Notes (Signed)
   Subjective:    Patient ID: Jason Farmer, male    DOB: 1934-12-20, 81 y.o.   MRN: [id number]  Sinusitis  This is a new problem. The current episode started 1 to 4 weeks ago (3 weeks ). The problem has been waxing and waning since onset. There has been no fever. Associated symptoms include congestion, coughing (productive ), headaches, sinus pressure and sneezing. Pertinent negatives include no chills, ear pain, neck pain or swollen glands. Past treatments include oral decongestants. The treatment provided mild relief.   He also needs a refill of ativan   Review of Systems  Constitutional: Positive for activity change and fatigue. Negative for appetite change, chills and fever.  HENT: Positive for congestion, postnasal drip, rhinorrhea, sinus pressure and sneezing. Negative for ear pain and sinus pain.   Eyes: Negative.   Respiratory: Positive for cough (productive ).   Cardiovascular: Negative.   Gastrointestinal: Negative.   Musculoskeletal: Negative for neck pain.  Skin: Negative.   Neurological: Positive for headaches.       Objective:   Physical Exam  Constitutional: He is oriented to person, place, and time. He appears well-developed and well-nourished. No distress.  HENT:  Head: Normocephalic and atraumatic.  Right Ear: External ear normal.  Left Ear: External ear normal.  Nose: Mucosal edema and rhinorrhea present. Right sinus exhibits frontal sinus tenderness. Right sinus exhibits no maxillary sinus tenderness. Left sinus exhibits frontal sinus tenderness. Left sinus exhibits no maxillary sinus tenderness.  Mouth/Throat: Uvula is midline and mucous membranes are normal. Oropharyngeal exudate present. No posterior oropharyngeal edema, posterior oropharyngeal erythema or tonsillar abscesses.  Eyes: Conjunctivae and EOM are normal. Pupils are equal, round, and reactive to light. Right eye exhibits no discharge. Left eye exhibits no discharge. No scleral icterus.  Neck: Normal  range of motion. Neck supple. No JVD present. No tracheal deviation present. No thyromegaly present.  Cardiovascular: Normal rate, regular rhythm, normal heart sounds and intact distal pulses.  Exam reveals no friction rub.   No murmur heard. Pulmonary/Chest: Effort normal and breath sounds normal. No stridor. No respiratory distress. He has no wheezes. He has no rales. He exhibits no tenderness.  Lymphadenopathy:    He has no cervical adenopathy.  Neurological: He is alert and oriented to person, place, and time.  Skin: Skin is warm and dry. No rash noted. He is not diaphoretic. No pallor.  Psychiatric: He has a normal mood and affect. His behavior is normal. Judgment and thought content normal.  Nursing note and vitals reviewed.      Assessment & Plan:  1. Acute frontal sinusitis, recurrence not specified - Will treat with doxycyline for sinusitis.  - doxycycline (VIBRAMYCIN) 100 MG capsule; Take 1 capsule (100 mg total) by mouth 2 (two) times daily.  Dispense: 14 capsule; Refill: 0 - Also use Flonase  - Stay hydrated and rest - follow up in 2-3 days if no improvement  2. ANXIETY DISORDER, GENERALIZED  - LORazepam (ATIVAN) 0.5 MG tablet; One half to one tablet each bedtime when necessary  Dispense: 30 tablet; Refill: 0  Dorothyann Peng, NP

## 2016-07-31 ENCOUNTER — Other Ambulatory Visit: Payer: Self-pay

## 2016-08-22 ENCOUNTER — Telehealth: Payer: Self-pay | Admitting: Adult Health

## 2016-08-22 NOTE — Telephone Encounter (Addendum)
Pt states he received a call from stephanie about awv.  Did you call pt?

## 2016-08-28 DIAGNOSIS — H401131 Primary open-angle glaucoma, bilateral, mild stage: Secondary | ICD-10-CM | POA: Diagnosis not present

## 2016-08-29 ENCOUNTER — Telehealth: Payer: Self-pay

## 2016-08-29 NOTE — Telephone Encounter (Signed)
Call back to Jason Farmer to schedule AWV Last one completed in Jan 2017  left number for call back Tuesday - Friday [phone]

## 2016-09-05 NOTE — Telephone Encounter (Signed)
The patient called back Apt with [facility] scheduled for 1/23/ Noted no time to complete AWV at visit Will have w Tommi Rumps and schedule after visit if [place] does not complete

## 2016-09-16 ENCOUNTER — Encounter: Payer: Self-pay | Admitting: Adult Health

## 2016-09-16 ENCOUNTER — Ambulatory Visit (INDEPENDENT_AMBULATORY_CARE_PROVIDER_SITE_OTHER): Payer: Medicare Other | Admitting: Adult Health

## 2016-09-16 VITALS — BP 144/68 | Temp 97.6°F | Ht 69.0 in | Wt 150.3 lb

## 2016-09-16 DIAGNOSIS — E785 Hyperlipidemia, unspecified: Secondary | ICD-10-CM | POA: Diagnosis not present

## 2016-09-16 DIAGNOSIS — R319 Hematuria, unspecified: Secondary | ICD-10-CM

## 2016-09-16 DIAGNOSIS — M25551 Pain in right hip: Secondary | ICD-10-CM | POA: Diagnosis not present

## 2016-09-16 DIAGNOSIS — M1990 Unspecified osteoarthritis, unspecified site: Secondary | ICD-10-CM | POA: Diagnosis not present

## 2016-09-16 DIAGNOSIS — I10 Essential (primary) hypertension: Secondary | ICD-10-CM | POA: Diagnosis not present

## 2016-09-16 DIAGNOSIS — F411 Generalized anxiety disorder: Secondary | ICD-10-CM | POA: Diagnosis not present

## 2016-09-16 LAB — URINALYSIS, MICROSCOPIC ONLY

## 2016-09-16 LAB — CBC WITH DIFFERENTIAL/PLATELET
Basophils Absolute: 0 10*3/uL (ref 0.0–0.1)
Basophils Relative: 0.3 % (ref 0.0–3.0)
Eosinophils Absolute: 0.1 10*3/uL (ref 0.0–0.7)
Eosinophils Relative: 1.2 % (ref 0.0–5.0)
HCT: 43.7 % (ref 39.0–52.0)
Hemoglobin: 14.8 g/dL (ref 13.0–17.0)
Lymphocytes Relative: 31.2 % (ref 12.0–46.0)
Lymphs Abs: 2.2 10*3/uL (ref 0.7–4.0)
MCHC: 33.8 g/dL (ref 30.0–36.0)
MCV: 89.6 fl (ref 78.0–100.0)
Monocytes Absolute: 0.6 10*3/uL (ref 0.1–1.0)
Monocytes Relative: 8 % (ref 3.0–12.0)
Neutro Abs: 4.1 10*3/uL (ref 1.4–7.7)
Neutrophils Relative %: 59.3 % (ref 43.0–77.0)
Platelets: 266 10*3/uL (ref 150.0–400.0)
RBC: 4.88 Mil/uL (ref 4.22–5.81)
RDW: 13.6 % (ref 11.5–15.5)
WBC: 6.9 10*3/uL (ref 4.0–10.5)

## 2016-09-16 LAB — POC URINALSYSI DIPSTICK (AUTOMATED)
Bilirubin, UA: NEGATIVE
Glucose, UA: NEGATIVE
Ketones, UA: NEGATIVE
Leukocytes, UA: NEGATIVE
Nitrite, UA: NEGATIVE
Spec Grav, UA: 1.025
Urobilinogen, UA: 0.2
pH, UA: 5.5

## 2016-09-16 LAB — LIPID PANEL
Cholesterol: 235 mg/dL — ABNORMAL HIGH (ref 0–200)
HDL: 51.7 mg/dL (ref >39.00)
LDL Cholesterol: 159 mg/dL — ABNORMAL HIGH (ref 0–99)
NonHDL: 183.76
Total CHOL/HDL Ratio: 5
Triglycerides: 122 mg/dL (ref 0.0–149.0)
VLDL: 24.4 mg/dL (ref 0.0–40.0)

## 2016-09-16 LAB — BASIC METABOLIC PANEL
BUN: 17 mg/dL (ref 6–23)
CO2: 30 mEq/L (ref 19–32)
Calcium: 9.6 mg/dL (ref 8.4–10.5)
Chloride: 103 mEq/L (ref 96–112)
Creatinine, Ser: 1.27 mg/dL (ref 0.40–1.50)
GFR: 58.09 mL/min — ABNORMAL LOW (ref >60.00)
Glucose, Bld: 92 mg/dL (ref 70–99)
Potassium: 4.9 mEq/L (ref 3.5–5.1)
Sodium: 139 mEq/L (ref 135–145)

## 2016-09-16 LAB — HEPATIC FUNCTION PANEL
ALT: 11 U/L (ref 0–53)
AST: 15 U/L (ref 0–37)
Albumin: 4.2 g/dL (ref 3.5–5.2)
Alkaline Phosphatase: 54 U/L (ref 39–117)
Bilirubin, Direct: 0.1 mg/dL (ref 0.0–0.3)
Total Bilirubin: 0.7 mg/dL (ref 0.2–1.2)
Total Protein: 6.7 g/dL (ref 6.0–8.3)

## 2016-09-16 LAB — TSH: TSH: 1.3 u[IU]/mL (ref 0.35–4.50)

## 2016-09-16 MED ORDER — MELOXICAM 7.5 MG PO TABS: 7.5000 mg | ORAL_TABLET | Freq: Every day | ORAL | 1 refills | Status: DC

## 2016-09-16 MED ORDER — ROSUVASTATIN CALCIUM 20 MG PO TABS: 20.0000 mg | ORAL_TABLET | Freq: Every day | ORAL | 1 refills | Status: DC

## 2016-09-16 MED ORDER — LORAZEPAM 0.5 MG PO TABS: ORAL_TABLET | ORAL | 0 refills | Status: DC

## 2016-09-16 NOTE — Patient Instructions (Signed)
It was great seeing you today!  I have sent in a prescription for Mobic to help with arthrtic pain.   I will follow up with you regarding your blood work   Have a great time fishing.

## 2016-09-16 NOTE — Progress Notes (Signed)
Subjective:    Patient ID: Jason Farmer, male    DOB: [birth year], 82 y.o.   MRN: [id number]  HPI  Patient presents for yearly follow up exam due to having a  has a past medical history of Anxiety; Arthritis; Back pain; Essential tremor; GERD (gastroesophageal reflux disease); Glaucoma; HOH (hard of hearing); Hyperlipemia; Seasonal allergies; TIA (transient ischemic attack) (04/2015); Wears glasses; and Wears hearing aid.  All immunizations and health maintenance protocols were reviewed with the patient and needed orders were placed.  Appropriate screening laboratory values were ordered for the patient including screening of hyperlipidemia, renal function and hepatic function.  Medication reconciliation,  past medical history, social history, problem list and allergies were reviewed in detail with the patient  Goals were established with regard to weight loss, exercise, and  diet in compliance with medications  End of life planning was discussed. He has an advanced directive and living will.   He takes Crestor 20 mg for hyperlipidemia and Atenolol 25 mg for hypertension.   He no longer needs to have a colonoscopy. He is up to date on his vision and dental screens.   His only interval history was being seen in the [facility] ER in August for suspected TIA.  His only acute complaint is that of arthritic pain in his hands. He has been using voltaren gel with minimal success.   He is going to see orthopedics today for right hip pain  Review of Systems  Constitutional: Negative.   HENT: Negative.   Eyes: Negative.   Respiratory: Negative.   Cardiovascular: Negative.   Gastrointestinal: Negative.   Endocrine: Negative.   Genitourinary: Negative.   Musculoskeletal: Positive for arthralgias and back pain. Negative for gait problem and joint swelling.  Skin: Negative.   Allergic/Immunologic: Negative.   Neurological: Negative.   Hematological: Negative.   Psychiatric/Behavioral:  Negative.   All other systems reviewed and are negative.  Past Medical History:  Diagnosis Date  . Anxiety   . Arthritis   . Back pain   . Essential tremor   . GERD (gastroesophageal reflux disease)   . Glaucoma   . HOH (hard of hearing)   . Hyperlipemia   . Seasonal allergies   . TIA (transient ischemic attack) 04/2015  . Wears glasses   . Wears hearing aid     Social History   Social History  . Marital status: Married    Spouse name: N/A  . Number of children: N/A  . Years of education: N/A   Occupational History  . Not on file.   Social History Main Topics  . Smoking status: Former Smoker    Quit date: 06/03/1984  . Smokeless tobacco: Former Systems developer    Quit date: 10/07/1986  . Alcohol use No  . Drug use: No  . Sexual activity: Not on file   Other Topics Concern  . Not on file   Social History Narrative   Retired from [employer]   Married    4 children, one son lives locally    Past Surgical History:  Procedure Laterality Date  . APPENDECTOMY    . CARPAL TUNNEL RELEASE Right 10/12/2013   Procedure: RIGHT CARPAL TUNNEL RELEASE;  Surgeon: Wynonia Sours, MD;  Location: [place];  Service: Orthopedics;  Laterality: Right;  . CERVICAL FUSION  2007  . COLONOSCOPY    . EYE SURGERY     both cataracts  . HEMORRHOID SURGERY    . KNEE ARTHROSCOPY  left  . NASAL SEPTOPLASTY W/ TURBINOPLASTY Bilateral 02/15/2013   Procedure: NASAL SEPTOPLASTY WITH BILATER TURBINATE REDUCTION;  Surgeon: Ascencion Dike, MD;  Location: [place];  Service: ENT;  Laterality: Bilateral;  . TONSILLECTOMY    . TRIGGER FINGER RELEASE  06/08/2012   Procedure: RELEASE TRIGGER FINGER/A-1 PULLEY;  Surgeon: Wynonia Sours, MD;  Location: [place];  Service: Orthopedics;  Laterality: Left;  Release A-1 Pulley Left Long Finger  . TRIGGER FINGER RELEASE  07/14/2012   Procedure: RELEASE TRIGGER FINGER/A-1 PULLEY;  Surgeon: Wynonia Sours, MD;  Location:  [place];  Service: Orthopedics;  Laterality: Right;  . TRIGGER FINGER RELEASE Right 10/12/2013   Procedure: RELEASE TRIGGER FINGER/A-1 PULLEY RIGHT MIDDLE FINGER;  Surgeon: Wynonia Sours, MD;  Location: [place];  Service: Orthopedics;  Laterality: Right;  . UPPER GASTROINTESTINAL ENDOSCOPY      Family History  Problem Relation Age of Onset  . Dementia Father   . Heart failure Father   . Glaucoma Mother   . Colon cancer Neg Hx     Allergies  Allergen Reactions  . Statins Other (See Comments)    Muscle cramps    Current Outpatient Prescriptions on File Prior to Visit  Medication Sig Dispense Refill  . atenolol (TENORMIN) 25 MG tablet Take 1 tablet (25 mg total) by mouth daily. 90 tablet 3  . atenolol (TENORMIN) 25 MG tablet TAKE 1 TABLET ONCE DAILY. 90 tablet 2  . latanoprost (XALATAN) 0.005 % ophthalmic solution Place 1 drop into both eyes at bedtime.     Marland Kitchen loratadine (CLARITIN) 10 MG tablet Take 1 tablet (10 mg total) by mouth daily. 90 tablet 3  . LORazepam (ATIVAN) 0.5 MG tablet One half to one tablet each bedtime when necessary 30 tablet 0  . omeprazole (PRILOSEC) 40 MG capsule Take 1 capsule (40 mg total) by mouth daily. 90 capsule 3  . rosuvastatin (CRESTOR) 20 MG tablet Take 1 tablet (20 mg total) by mouth daily. (Patient taking differently: Take 20 mg by mouth daily. Half tablet every other day) 90 tablet 1   No current facility-administered medications on file prior to visit.     BP (!) 144/68   Temp 97.6 F (36.4 C) (Oral)   Ht 5\' 9"  (1.753 m)   Wt 150 lb 4.8 oz (68.2 kg)   BMI 22.20 kg/m       Objective:   Physical Exam  Constitutional: He is oriented to person, place, and time. He appears well-developed and well-nourished. No distress.  HENT:  Head: Normocephalic and atraumatic.  Right Ear: External ear normal.  Left Ear: External ear normal.  Nose: Nose normal.  Mouth/Throat: Oropharynx is clear and moist. No  oropharyngeal exudate.  Eyes: Conjunctivae and EOM are normal. Pupils are equal, round, and reactive to light. Right eye exhibits no discharge. Left eye exhibits no discharge. No scleral icterus.  Neck: Normal range of motion. Neck supple. No JVD present. Carotid bruit is not present. No tracheal deviation present. No thyroid mass and no thyromegaly present.  Cardiovascular: Normal rate, regular rhythm, normal heart sounds and intact distal pulses.  Exam reveals no gallop and no friction rub.   No murmur heard. Pulmonary/Chest: Effort normal and breath sounds normal. No stridor. No respiratory distress. He has no wheezes. He has no rales. He exhibits no tenderness.  Abdominal: Soft. Bowel sounds are normal. He exhibits no distension and no mass. There is no tenderness. There  is no rebound and no guarding.  Genitourinary:  Genitourinary Comments: deferred  Musculoskeletal: Normal range of motion. He exhibits no edema, tenderness or deformity.  Lymphadenopathy:    He has no cervical adenopathy.  Neurological: He is alert and oriented to person, place, and time. He has normal reflexes. He displays normal reflexes. No cranial nerve deficit. He exhibits normal muscle tone. Coordination normal.  Skin: Skin is warm and dry. No rash noted. He is not diaphoretic. No erythema. No pallor.  Psychiatric: He has a normal mood and affect. His behavior is normal. Judgment and thought content normal.  Nursing note and vitals reviewed.     Assessment & Plan:  1. Essential hypertension, benign - Continue with current dose of Atenolol. Did not take BP medication this morning.  - Basic metabolic panel - CBC with Differential/Platelet - Hepatic function panel - Lipid panel - POCT Urinalysis Dipstick (Automated) - TSH - Continue to stay active and eat healthy  2. Hyperlipidemia, unspecified hyperlipidemia type  - Basic metabolic panel - CBC with Differential/Platelet - Hepatic function panel - Lipid  panel - POCT Urinalysis Dipstick (Automated) - TSH - rosuvastatin (CRESTOR) 20 MG tablet; Take 1 tablet (20 mg total) by mouth daily.  Dispense: 90 tablet; Refill: 1 - Consider increasing Crestor  3. ANXIETY DISORDER, GENERALIZED - LORazepam (ATIVAN) 0.5 MG tablet; One half to one tablet each bedtime when necessary  Dispense: 30 tablet; Refill: 0  4. Arthritis - meloxicam (MOBIC) 7.5 MG tablet; Take 1 tablet (7.5 mg total) by mouth daily.  Dispense: 30 tablet; Refill: 1 - Stop Voltaren gel   Dorothyann Peng, NP

## 2016-09-18 DIAGNOSIS — M545 Low back pain: Secondary | ICD-10-CM | POA: Diagnosis not present

## 2016-09-18 DIAGNOSIS — M5431 Sciatica, right side: Secondary | ICD-10-CM | POA: Diagnosis not present

## 2016-11-10 ENCOUNTER — Encounter: Payer: Self-pay | Admitting: Family Medicine

## 2016-11-10 ENCOUNTER — Ambulatory Visit (INDEPENDENT_AMBULATORY_CARE_PROVIDER_SITE_OTHER): Payer: Medicare Other | Admitting: Family Medicine

## 2016-11-10 VITALS — BP 128/80 | HR 70 | Resp 12 | Ht 69.0 in | Wt 157.4 lb

## 2016-11-10 DIAGNOSIS — M791 Myalgia, unspecified site: Secondary | ICD-10-CM

## 2016-11-10 DIAGNOSIS — R5383 Other fatigue: Secondary | ICD-10-CM | POA: Diagnosis not present

## 2016-11-10 DIAGNOSIS — R197 Diarrhea, unspecified: Secondary | ICD-10-CM | POA: Diagnosis not present

## 2016-11-10 LAB — POC INFLUENZA A&B (BINAX/QUICKVUE)
Influenza A, POC: NEGATIVE
Influenza B, POC: NEGATIVE

## 2016-11-10 NOTE — Patient Instructions (Signed)
A few things to remember from today's visit:   Myalgia - Plan: POC Influenza A&B (Binax test), Comprehensive metabolic panel, CBC, TSH, C-reactive protein, CK (Creatine Kinase)  Fatigue, unspecified type - Plan: Comprehensive metabolic panel, CBC, TSH, C-reactive protein, CK (Creatine Kinase)  Tylenol,rest, and adequate fluid intake.  ? Viral illness,self limited.  Please be sure medication list is accurate. If a new problem present, please set up appointment sooner than planned today.

## 2016-11-10 NOTE — Progress Notes (Signed)
HPI:   ACUTE VISIT:  Chief Complaint  Patient presents with  . Fatigue  . Generalized Body Aches    Jason Farmer is a 82 y.o. male, who is here today complaining of extreme fatigue and body aches for about 4-5 weeks. According to pt, he started with fatigue, frontal "sinus" headache, nasal congestion,and rhinorrhea. He travelled to [place] and [place] with his daughter on 10/07/16, while he was in [place] he had 2 episodes of loose stools, fatigue was not bad, he still could function during the day without taking naps or resting. Daughter got sick with diarrhea and fever while she was in [place] and today she was Dx with influenza A, he would like test done. He came back to the country on 10/22/16.  Feeling "woozy."  Fatigue is progressively getting worse, sleeping more than usual and taking naps during the day. He feels "good" in the morning when he first gets up but 2 hours after he starts feeling tired. Usually feels better after 3 hours nap. He denies fever,chills, night sweats, odynophagia,cough,wheezing,or dyspnea. No abdominal pain,vomiting,or urinary symptoms. For the past 2-3 weeks softer stools, 3 stools daily (his normal is once daily) Nausea a few times when eating certain foods, decreased appetite.  Hx of OA, arthralgias are not worse that usual.  He feels like he has not felt well since before he travel, feeling worse. He is not sure if he is having same illness all this time or if he got something new before fully recovering from initial illness. He mentions that while in [place] he was around "death animals", sheep. He is not aware of any particular illness, states that sheep died at birth.  He has not taken medication for this particular problem.   Review of Systems  Constitutional: Positive for activity change, appetite change and fatigue. Negative for chills, fever and unexpected weight change.  HENT: Positive for congestion, rhinorrhea and sinus pressure.  Negative for mouth sores, nosebleeds, sore throat and trouble swallowing.   Eyes: Negative for redness and visual disturbance.  Respiratory: Negative for chest tightness, shortness of breath and wheezing.   Cardiovascular: Negative for chest pain, palpitations and leg swelling.  Gastrointestinal: Positive for diarrhea and nausea. Negative for abdominal pain, blood in stool and vomiting.  Genitourinary: Negative for decreased urine volume, dysuria and hematuria.  Musculoskeletal: Positive for arthralgias, back pain and myalgias. Negative for gait problem and joint swelling.  Skin: Negative for color change and rash.  Neurological: Positive for headaches (mild frontal pressure). Negative for syncope, weakness and numbness.  Hematological: Negative for adenopathy. Does not bruise/bleed easily.  Psychiatric/Behavioral: Negative for confusion. The patient is nervous/anxious.       Current Outpatient Prescriptions on File Prior to Visit  Medication Sig Dispense Refill  . atenolol (TENORMIN) 25 MG tablet TAKE 1 TABLET ONCE DAILY. 90 tablet 2  . latanoprost (XALATAN) 0.005 % ophthalmic solution Place 1 drop into both eyes at bedtime.     Marland Kitchen loratadine (CLARITIN) 10 MG tablet Take 1 tablet (10 mg total) by mouth daily. 90 tablet 3  . LORazepam (ATIVAN) 0.5 MG tablet One half to one tablet each bedtime when necessary 30 tablet 0  . meloxicam (MOBIC) 7.5 MG tablet Take 1 tablet (7.5 mg total) by mouth daily. 30 tablet 1  . omeprazole (PRILOSEC) 40 MG capsule Take 1 capsule (40 mg total) by mouth daily. 90 capsule 3  . rosuvastatin (CRESTOR) 20 MG tablet Take 1 tablet (20 mg total)  by mouth daily. 90 tablet 1   No current facility-administered medications on file prior to visit.      Past Medical History:  Diagnosis Date  . Anxiety   . Arthritis   . Back pain   . Essential tremor   . GERD (gastroesophageal reflux disease)   . Glaucoma   . HOH (hard of hearing)   . Hyperlipemia   . Seasonal  allergies   . TIA (transient ischemic attack) 04/2015  . Wears glasses   . Wears hearing aid    Allergies  Allergen Reactions  . Statins Other (See Comments)    Muscle cramps    Social History   Social History  . Marital status: Married    Spouse name: N/A  . Number of children: N/A  . Years of education: N/A   Social History Main Topics  . Smoking status: Former Smoker    Quit date: 06/03/1984  . Smokeless tobacco: Former Systems developer    Quit date: 10/07/1986  . Alcohol use No  . Drug use: No  . Sexual activity: Not Asked   Other Topics Concern  . None   Social History Narrative   Retired from [employer]   Married    4 children, one son lives locally    [place]:   11/10/16 1531  BP: 128/80  Pulse: 70  Resp: 12  O2 sat at RA 97% Body mass index is 23.24 kg/m.   Physical Exam  Nursing note and vitals reviewed. Constitutional: He is oriented to person, place, and time. He appears well-developed and well-nourished. He does not appear ill. No distress.  HENT:  Head: Atraumatic.  Nose: Rhinorrhea present. Right sinus exhibits no maxillary sinus tenderness and no frontal sinus tenderness. Left sinus exhibits no maxillary sinus tenderness and no frontal sinus tenderness.  Mouth/Throat: Oropharynx is clear and moist and mucous membranes are normal.  Eyes: Conjunctivae and EOM are normal.  Neck: Neck supple. No thyroid mass and no thyromegaly present.  Cardiovascular: Normal rate and regular rhythm.   No murmur heard. Respiratory: Effort normal and breath sounds normal. No respiratory distress.  GI: Soft. Bowel sounds are normal. He exhibits no mass. There is no hepatomegaly. There is no tenderness.  Musculoskeletal: He exhibits no edema or tenderness.  No signs of synovitis or significant deformity appreciated.  Lymphadenopathy:    He has no cervical adenopathy.  Neurological: He is alert and oriented to person, place, and time. He has normal strength. No cranial  nerve deficit. Coordination and gait normal.  Skin: Skin is warm. No rash noted. No erythema.  Psychiatric: He has a normal mood and affect. His speech is normal.  Well groomed, good eye contact.      ASSESSMENT AND PLAN:     Xavyer was seen today for fatigue and generalized body aches.  Diagnoses and all orders for this visit:  Fatigue, unspecified type  We discussed possible etiologies, he denies prior Hx. ? Viral illness. Rapid flu was negative.  -     Comprehensive metabolic panel -     CBC -     TSH -     C-reactive protein  Myalgia  It seems like he was already sick at the time he travelled to [place].  Still some to consider,given his Hx of travel: Dengue,chikungunya,Zika, other viral illness, and less likely zoonosis (exposure to sheep).    Further recommendations will be given according to lab results.   -     POC Influenza A&B (  Binax test) -     Comprehensive metabolic panel -     CBC -     TSH -     C-reactive protein -     CK (Creatine Kinase)  Diarrhea, unspecified type  Mild. No associated abdominal pain. Adequate hydration. Instructed about warning signs. Stool Cx may be necessary if problem persists.  -     POC Influenza A&B (Binax test) -     Comprehensive metabolic panel -     CBC -     TSH -     C-reactive protein     Return in about 2 weeks (around 11/24/2016) for PCP.     -Mr.STANLEY LYNESS was advised to return or notify a doctor immediately if symptoms worsen or new concerns arise.       Shantay Sonn G. [place], MD  [facility]. [place] office.

## 2016-11-10 NOTE — Progress Notes (Signed)
Pre visit review using our clinic review tool, if applicable. No additional management support is needed unless otherwise documented below in the visit note. 

## 2016-11-11 ENCOUNTER — Encounter: Payer: Self-pay | Admitting: Family Medicine

## 2016-11-11 LAB — CBC
HCT: 44.1 % (ref 39.0–52.0)
Hemoglobin: 14.8 g/dL (ref 13.0–17.0)
MCHC: 33.4 g/dL (ref 30.0–36.0)
MCV: 90.8 fl (ref 78.0–100.0)
Platelets: 278 10*3/uL (ref 150.0–400.0)
RBC: 4.86 Mil/uL (ref 4.22–5.81)
RDW: 13.3 % (ref 11.5–15.5)
WBC: 7.8 10*3/uL (ref 4.0–10.5)

## 2016-11-11 LAB — COMPREHENSIVE METABOLIC PANEL
ALT: 11 U/L (ref 0–53)
AST: 13 U/L (ref 0–37)
Albumin: 4 g/dL (ref 3.5–5.2)
Alkaline Phosphatase: 56 U/L (ref 39–117)
BUN: 18 mg/dL (ref 6–23)
CO2: 27 mEq/L (ref 19–32)
Calcium: 9.6 mg/dL (ref 8.4–10.5)
Chloride: 105 mEq/L (ref 96–112)
Creatinine, Ser: 1.26 mg/dL (ref 0.40–1.50)
GFR: 58.59 mL/min — ABNORMAL LOW (ref >60.00)
Glucose, Bld: 112 mg/dL — ABNORMAL HIGH (ref 70–99)
Potassium: 4.5 mEq/L (ref 3.5–5.1)
Sodium: 139 mEq/L (ref 135–145)
Total Bilirubin: 0.3 mg/dL (ref 0.2–1.2)
Total Protein: 6.3 g/dL (ref 6.0–8.3)

## 2016-11-11 LAB — TSH: TSH: 0.95 u[IU]/mL (ref 0.35–4.50)

## 2016-11-11 LAB — C-REACTIVE PROTEIN: CRP: 0.1 mg/dL — ABNORMAL LOW (ref 0.5–20.0)

## 2016-11-11 LAB — CK: Total CK: 75 U/L (ref 7–232)

## 2016-11-25 ENCOUNTER — Ambulatory Visit: Payer: Medicare Other | Admitting: Adult Health

## 2016-11-27 ENCOUNTER — Ambulatory Visit (INDEPENDENT_AMBULATORY_CARE_PROVIDER_SITE_OTHER): Payer: Medicare Other | Admitting: Adult Health

## 2016-11-27 ENCOUNTER — Observation Stay (HOSPITAL_COMMUNITY)
Admission: EM | Admit: 2016-11-27 | Discharge: 2016-11-28 | Disposition: A | Discharge status: Home / Self Care | Payer: Medicare Other | Attending: Internal Medicine | Admitting: Internal Medicine

## 2016-11-27 ENCOUNTER — Encounter (HOSPITAL_COMMUNITY): Payer: Self-pay | Admitting: *Deleted

## 2016-11-27 ENCOUNTER — Emergency Department (HOSPITAL_COMMUNITY): Payer: Medicare Other

## 2016-11-27 ENCOUNTER — Other Ambulatory Visit: Payer: Self-pay

## 2016-11-27 VITALS — BP 148/82 | HR 68

## 2016-11-27 DIAGNOSIS — E785 Hyperlipidemia, unspecified: Secondary | ICD-10-CM | POA: Insufficient documentation

## 2016-11-27 DIAGNOSIS — K219 Gastro-esophageal reflux disease without esophagitis: Secondary | ICD-10-CM | POA: Insufficient documentation

## 2016-11-27 DIAGNOSIS — R079 Chest pain, unspecified: Principal | ICD-10-CM | POA: Insufficient documentation

## 2016-11-27 DIAGNOSIS — F411 Generalized anxiety disorder: Secondary | ICD-10-CM | POA: Diagnosis not present

## 2016-11-27 DIAGNOSIS — Z7982 Long term (current) use of aspirin: Secondary | ICD-10-CM | POA: Insufficient documentation

## 2016-11-27 DIAGNOSIS — N529 Male erectile dysfunction, unspecified: Secondary | ICD-10-CM | POA: Insufficient documentation

## 2016-11-27 DIAGNOSIS — M1990 Unspecified osteoarthritis, unspecified site: Secondary | ICD-10-CM | POA: Diagnosis not present

## 2016-11-27 DIAGNOSIS — Z8249 Family history of ischemic heart disease and other diseases of the circulatory system: Secondary | ICD-10-CM | POA: Diagnosis not present

## 2016-11-27 DIAGNOSIS — G2581 Restless legs syndrome: Secondary | ICD-10-CM | POA: Diagnosis not present

## 2016-11-27 DIAGNOSIS — I10 Essential (primary) hypertension: Secondary | ICD-10-CM | POA: Insufficient documentation

## 2016-11-27 DIAGNOSIS — I700 Atherosclerosis of aorta: Secondary | ICD-10-CM | POA: Insufficient documentation

## 2016-11-27 DIAGNOSIS — G250 Essential tremor: Secondary | ICD-10-CM | POA: Insufficient documentation

## 2016-11-27 DIAGNOSIS — R918 Other nonspecific abnormal finding of lung field: Secondary | ICD-10-CM | POA: Diagnosis not present

## 2016-11-27 DIAGNOSIS — Z87891 Personal history of nicotine dependence: Secondary | ICD-10-CM | POA: Insufficient documentation

## 2016-11-27 DIAGNOSIS — R072 Precordial pain: Secondary | ICD-10-CM | POA: Diagnosis not present

## 2016-11-27 DIAGNOSIS — Z8673 Personal history of transient ischemic attack (TIA), and cerebral infarction without residual deficits: Secondary | ICD-10-CM | POA: Diagnosis not present

## 2016-11-27 DIAGNOSIS — R7989 Other specified abnormal findings of blood chemistry: Secondary | ICD-10-CM | POA: Diagnosis not present

## 2016-11-27 HISTORY — DX: Inflammatory liver disease, unspecified: K75.9

## 2016-11-27 HISTORY — DX: Cardiac murmur, unspecified: R01.1

## 2016-11-27 HISTORY — DX: Low back pain: M54.5

## 2016-11-27 HISTORY — DX: Low back pain, unspecified: M54.50

## 2016-11-27 HISTORY — DX: Other chronic pain: G89.29

## 2016-11-27 HISTORY — DX: Migraine, unspecified, not intractable, without status migrainosus: G43.909

## 2016-11-27 LAB — BASIC METABOLIC PANEL
Anion gap: 6 (ref 5–15)
BUN: 14 mg/dL (ref 6–20)
CO2: 26 mmol/L (ref 22–32)
Calcium: 9.1 mg/dL (ref 8.9–10.3)
Chloride: 104 mmol/L (ref 101–111)
Creatinine, Ser: 1.33 mg/dL — ABNORMAL HIGH (ref 0.61–1.24)
GFR calc Af Amer: 57 mL/min — ABNORMAL LOW (ref >60)
GFR calc non Af Amer: 49 mL/min — ABNORMAL LOW (ref >60)
Glucose, Bld: 86 mg/dL (ref 65–99)
Potassium: 4.2 mmol/L (ref 3.5–5.1)
Sodium: 136 mmol/L (ref 135–145)

## 2016-11-27 LAB — CBC
HCT: 43 % (ref 39.0–52.0)
Hemoglobin: 14.2 g/dL (ref 13.0–17.0)
MCH: 29.8 pg (ref 26.0–34.0)
MCHC: 33 g/dL (ref 30.0–36.0)
MCV: 90.3 fL (ref 78.0–100.0)
Platelets: 253 10*3/uL (ref 150–400)
RBC: 4.76 MIL/uL (ref 4.22–5.81)
RDW: 13 % (ref 11.5–15.5)
WBC: 9.4 10*3/uL (ref 4.0–10.5)

## 2016-11-27 LAB — TROPONIN I: Troponin I: 0.1 ng/mL (ref <0.03)

## 2016-11-27 LAB — I-STAT TROPONIN, ED: Troponin i, poc: 0.01 ng/mL (ref 0.00–0.08)

## 2016-11-27 LAB — D-DIMER, QUANTITATIVE: D-Dimer, Quant: 3.29 ug/mL-FEU — ABNORMAL HIGH (ref 0.00–0.50)

## 2016-11-27 MED ORDER — LATANOPROST 0.005 % OP SOLN
1.0000 [drp] | Freq: Every day | OPHTHALMIC | Status: DC
  Administered 2016-11-27: 1 [drp] via OPHTHALMIC
  Filled 2016-11-27: qty 2.5

## 2016-11-27 MED ORDER — ASPIRIN 81 MG PO CHEW
324.0000 mg | CHEWABLE_TABLET | Freq: Once | ORAL | Status: AC
  Administered 2016-11-27: 324 mg via ORAL
  Filled 2016-11-27: qty 4

## 2016-11-27 MED ORDER — ASPIRIN EC 81 MG PO TBEC
81.0000 mg | DELAYED_RELEASE_TABLET | Freq: Every day | ORAL | Status: DC
  Administered 2016-11-27 – 2016-11-28 (×2): 81 mg via ORAL
  Filled 2016-11-27 (×2): qty 1

## 2016-11-27 MED ORDER — PANTOPRAZOLE SODIUM 40 MG PO TBEC
80.0000 mg | DELAYED_RELEASE_TABLET | Freq: Every day | ORAL | Status: DC
  Administered 2016-11-28: 40 mg via ORAL
  Filled 2016-11-27: qty 2

## 2016-11-27 MED ORDER — ROSUVASTATIN CALCIUM 10 MG PO TABS: 10.0000 mg | ORAL_TABLET | Freq: Every day | ORAL | Status: DC

## 2016-11-27 MED ORDER — ONDANSETRON HCL 4 MG/2ML IJ SOLN: 4.0000 mg | Freq: Four times a day (QID) | INTRAMUSCULAR | Status: DC | PRN

## 2016-11-27 MED ORDER — IOPAMIDOL (ISOVUE-370) INJECTION 76%
INTRAVENOUS | Status: AC
  Administered 2016-11-27: 100 mL
  Filled 2016-11-27: qty 100

## 2016-11-27 MED ORDER — ACETAMINOPHEN 325 MG PO TABS: 650.0000 mg | ORAL_TABLET | ORAL | Status: DC | PRN

## 2016-11-27 MED ORDER — LORATADINE 10 MG PO TABS
10.0000 mg | ORAL_TABLET | Freq: Every day | ORAL | Status: DC
  Administered 2016-11-28: 10 mg via ORAL
  Filled 2016-11-27: qty 1

## 2016-11-27 MED ORDER — ENOXAPARIN SODIUM 40 MG/0.4ML ~~LOC~~ SOLN
40.0000 mg | SUBCUTANEOUS | Status: DC
  Administered 2016-11-27: 40 mg via SUBCUTANEOUS
  Filled 2016-11-27: qty 0.4

## 2016-11-27 MED ORDER — ATENOLOL 25 MG PO TABS
12.5000 mg | ORAL_TABLET | Freq: Every day | ORAL | Status: DC
  Administered 2016-11-28: 12.5 mg via ORAL
  Filled 2016-11-27: qty 1

## 2016-11-27 NOTE — Progress Notes (Signed)
Patient arrived from ED with chest pain. Denies pain at this time. Alert and oriented. Vitals stable. CCMD called and skin assessment times two nurses with small left arm skin tear.

## 2016-11-27 NOTE — Progress Notes (Signed)
Subjective:    Patient ID: Jason Farmer, male    DOB: 02/21/1935, 82 y.o.   MRN: [id number]  HPI  82 year old male who  has a past medical history of Anxiety; Arthritis; Back pain; Essential tremor; GERD (gastroesophageal reflux disease); Glaucoma; HOH (hard of hearing); Hyperlipemia; Seasonal allergies; TIA (transient ischemic attack) (04/2015); Wears glasses; and Wears hearing aid.  presents to the office today with the acute complaint of chest pain that started two hours ago while mowing the yard. He reports that the pain " feels as though it was a radiating pain out of the center of my chest."  The pain lasted about an hour and waxed and waned. He has never had this type of pain before, states " I have had gas pain and this was not it." His wife reports that he came in from the yard clutching his chest.  He took two regular strength ASA  Denies any SOB, CP, nausea or vomiting  No radiating pain down arms or up jaw line    BP Readings from Last 3 Encounters:  11/27/16 (!) 148/82  11/10/16 128/80  09/16/16 (!) 144/68    Review of Systems See HPI   Past Medical History:  Diagnosis Date  . Anxiety   . Arthritis   . Back pain   . Essential tremor   . GERD (gastroesophageal reflux disease)   . Glaucoma   . HOH (hard of hearing)   . Hyperlipemia   . Seasonal allergies   . TIA (transient ischemic attack) 04/2015  . Wears glasses   . Wears hearing aid     Social History   Social History  . Marital status: Married    Spouse name: N/A  . Number of children: N/A  . Years of education: N/A   Occupational History  . Not on file.   Social History Main Topics  . Smoking status: Former Smoker    Quit date: 06/03/1984  . Smokeless tobacco: Former Systems developer    Quit date: 10/07/1986  . Alcohol use No  . Drug use: No  . Sexual activity: Not on file   Other Topics Concern  . Not on file   Social History Narrative   Retired from [employer]   Married    4 children,  one son lives locally    Past Surgical History:  Procedure Laterality Date  . APPENDECTOMY    . CARPAL TUNNEL RELEASE Right 10/12/2013   Procedure: RIGHT CARPAL TUNNEL RELEASE;  Surgeon: Wynonia Sours, MD;  Location: [facility];  Service: Orthopedics;  Laterality: Right;  . CERVICAL FUSION  2007  . COLONOSCOPY    . EYE SURGERY     both cataracts  . HEMORRHOID SURGERY    . KNEE ARTHROSCOPY     left  . NASAL SEPTOPLASTY W/ TURBINOPLASTY Bilateral 02/15/2013   Procedure: NASAL SEPTOPLASTY WITH BILATER TURBINATE REDUCTION;  Surgeon: Ascencion Dike, MD;  Location: [facility];  Service: ENT;  Laterality: Bilateral;  . TONSILLECTOMY    . TRIGGER FINGER RELEASE  06/08/2012   Procedure: RELEASE TRIGGER FINGER/A-1 PULLEY;  Surgeon: Wynonia Sours, MD;  Location: [facility];  Service: Orthopedics;  Laterality: Left;  Release A-1 Pulley Left Long Finger  . TRIGGER FINGER RELEASE  07/14/2012   Procedure: RELEASE TRIGGER FINGER/A-1 PULLEY;  Surgeon: Wynonia Sours, MD;  Location: [facility];  Service: Orthopedics;  Laterality: Right;  . TRIGGER FINGER RELEASE Right  10/12/2013   Procedure: RELEASE TRIGGER FINGER/A-1 PULLEY RIGHT MIDDLE FINGER;  Surgeon: Wynonia Sours, MD;  Location: [facility];  Service: Orthopedics;  Laterality: Right;  . UPPER GASTROINTESTINAL ENDOSCOPY      Family History  Problem Relation Age of Onset  . Dementia Father   . Heart failure Father   . Glaucoma Mother   . Colon cancer Neg Hx     Allergies  Allergen Reactions  . Statins Other (See Comments)    Muscle cramps    Current Outpatient Prescriptions on File Prior to Visit  Medication Sig Dispense Refill  . atenolol (TENORMIN) 25 MG tablet TAKE 1 TABLET ONCE DAILY. 90 tablet 2  . latanoprost (XALATAN) 0.005 % ophthalmic solution Place 1 drop into both eyes at bedtime.     Marland Kitchen loratadine (CLARITIN) 10 MG tablet Take 1 tablet (10 mg total) by mouth daily.  90 tablet 3  . LORazepam (ATIVAN) 0.5 MG tablet One half to one tablet each bedtime when necessary 30 tablet 0  . meloxicam (MOBIC) 7.5 MG tablet Take 1 tablet (7.5 mg total) by mouth daily. 30 tablet 1  . omeprazole (PRILOSEC) 40 MG capsule Take 1 capsule (40 mg total) by mouth daily. 90 capsule 3  . rosuvastatin (CRESTOR) 20 MG tablet Take 1 tablet (20 mg total) by mouth daily. 90 tablet 1   No current facility-administered medications on file prior to visit.     BP (!) 148/82   Pulse 68   SpO2 98%        Objective:   Physical Exam  Constitutional: He is oriented to person, place, and time. He appears well-developed and well-nourished. No distress.  Eyes: Conjunctivae and EOM are normal. Pupils are equal, round, and reactive to light. Right eye exhibits no discharge. No scleral icterus.  Neck: Normal range of motion. Neck supple. No thyromegaly present.  Cardiovascular: Normal rate, regular rhythm, normal heart sounds and intact distal pulses.  Exam reveals no gallop and no friction rub.   No murmur heard. Pulmonary/Chest: Effort normal and breath sounds normal. No respiratory distress. He has no wheezes. He has no rales. He exhibits no tenderness.  Abdominal: Soft. Bowel sounds are normal. He exhibits no distension and no mass. There is no tenderness. There is no rebound and no guarding.  Musculoskeletal: Normal range of motion. He exhibits no edema, tenderness or deformity.  Lymphadenopathy:    He has no cervical adenopathy.  Neurological: He is alert and oriented to person, place, and time. He has normal reflexes. He displays normal reflexes. No cranial nerve deficit. He exhibits normal muscle tone. Coordination normal.  Skin: Skin is warm and dry. No rash noted. He is not diaphoretic. No erythema. No pallor.  Psychiatric: He has a normal mood and affect. His behavior is normal. Judgment and thought content normal.  Nursing note and vitals reviewed.     Assessment & Plan:  1.  Chest pain, unspecified type - EKG - Sinus Rhythm - Rate 63.  - Chest pain with exertion. Although NSR in the office, I would like him to be evaluated further in the ER due to complaint  Will likely need cardiology consult in the future   Dorothyann Peng, NP  -

## 2016-11-27 NOTE — ED Provider Notes (Signed)
[facility] DEPT Provider Note   CSN: [id number] Arrival date & time: 11/27/16  1445     History   Chief Complaint Chief Complaint  Patient presents with  . Chest Pain    HPI Jason Farmer is a 82 y.o. male.  The history is provided by the patient.  Chest Pain   This is a new problem. The current episode started 3 to 5 hours ago. Episode frequency: intermittent. two times. The pain is associated with exertion. The pain is present in the substernal region. The pain is moderate (pain free since 2 pm). The quality of the pain is described as dull and sharp. The pain does not radiate. The symptoms are aggravated by certain positions. Associated symptoms include dizziness, malaise/fatigue and nausea. Pertinent negatives include no fever, no headaches, no lower extremity edema, no shortness of breath and no vomiting. Risk factors include being elderly and male gender.  His past medical history is significant for hyperlipidemia and TIA.  Pertinent negatives for past medical history include no CAD, no hypertension, no MI and no PE.  Procedure history is positive for exercise treadmill test (20 yrs ago).  Procedure history is negative for cardiac catheterization.   Patient endorsed recent travel with an 8 hour plane ride. Denied any lower extremity swelling or pain.  Past Medical History:  Diagnosis Date  . Anxiety   . Arthritis   . Back pain   . Essential tremor   . GERD (gastroesophageal reflux disease)   . Glaucoma   . HOH (hard of hearing)   . Hyperlipemia   . Seasonal allergies   . TIA (transient ischemic attack) 04/2015  . Wears glasses   . Wears hearing aid     Patient Active Problem List   Diagnosis Date Noted  . Essential hypertension, benign 12/07/2014  . Muscle cramps 03/14/2014  . Allergic rhinitis, seasonal 04/06/2012  . PERSONAL HX COLONIC POLYPS 08/06/2010  . GERD 11/05/2009  . RESTLESS LEG SYNDROME 10/04/2009  . Hyperlipidemia 08/23/2009  . ANXIETY  DISORDER, GENERALIZED 08/23/2009  . TREMOR, ESSENTIAL 08/23/2009  . 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 APPENDECTOMY    . CARPAL TUNNEL RELEASE Right 10/12/2013   Procedure: RIGHT CARPAL TUNNEL RELEASE;  Surgeon: Wynonia Sours, MD;  Location: [facility];  Service: Orthopedics;  Laterality: Right;  . CERVICAL FUSION  2007  . COLONOSCOPY    . EYE SURGERY     both cataracts  . HEMORRHOID SURGERY    . KNEE ARTHROSCOPY     left  . NASAL SEPTOPLASTY W/ TURBINOPLASTY Bilateral 02/15/2013   Procedure: NASAL SEPTOPLASTY WITH BILATER TURBINATE REDUCTION;  Surgeon: Ascencion Dike, MD;  Location: [facility];  Service: ENT;  Laterality: Bilateral;  . TONSILLECTOMY    . TRIGGER FINGER RELEASE  06/08/2012   Procedure: RELEASE TRIGGER FINGER/A-1 PULLEY;  Surgeon: Wynonia Sours, MD;  Location: [facility];  Service: Orthopedics;  Laterality: Left;  Release A-1 Pulley Left Long Finger  . TRIGGER FINGER RELEASE  07/14/2012   Procedure: RELEASE TRIGGER FINGER/A-1 PULLEY;  Surgeon: Wynonia Sours, MD;  Location: [facility];  Service: Orthopedics;  Laterality: Right;  . TRIGGER FINGER RELEASE Right 10/12/2013   Procedure: RELEASE TRIGGER FINGER/A-1 PULLEY RIGHT MIDDLE FINGER;  Surgeon: Wynonia Sours, MD;  Location: [facility];  Service: Orthopedics;  Laterality: Right;  . UPPER GASTROINTESTINAL ENDOSCOPY  Home Medications    Prior to Admission medications   Medication Sig Start Date End Date Taking? Authorizing Provider  aspirin 325 MG EC tablet Take 650 mg by mouth every 6 (six) hours as needed for pain.   Yes Historical Provider, MD  aspirin EC 81 MG tablet Take 81 mg by mouth daily.   Yes Historical Provider, MD  atenolol (TENORMIN) 25 MG tablet TAKE 1 TABLET ONCE DAILY. Patient taking differently: TAKE 1/2 TABLET EVERY OTHER DAY 01/15/16  Yes Dorothyann Peng, NP    latanoprost (XALATAN) 0.005 % ophthalmic solution Place 1 drop into both eyes at bedtime.    Yes Historical Provider, MD  loratadine (CLARITIN) 10 MG tablet Take 1 tablet (10 mg total) by mouth daily. 09/14/15  Yes Dorothyann Peng, NP  omeprazole (PRILOSEC) 40 MG capsule Take 1 capsule (40 mg total) by mouth daily. 09/14/15  Yes Dorothyann Peng, NP  rosuvastatin (CRESTOR) 20 MG tablet Take 1 tablet (20 mg total) by mouth daily. Patient taking differently: Take 10 mg by mouth daily.  09/16/16  Yes Dorothyann Peng, NP    Family History Family History  Problem Relation Age of Onset  . Dementia Father   . Heart failure Father   . Glaucoma Mother   . Colon cancer Neg Hx     Social History Social History  Substance Use Topics  . Smoking status: Former Smoker    Quit date: 06/03/1984  . Smokeless tobacco: Former Systems developer    Quit date: 10/07/1986  . Alcohol use No     Allergies   Statins   Review of Systems Review of Systems  Constitutional: Positive for malaise/fatigue. Negative for fever.  Respiratory: Negative for shortness of breath.   Cardiovascular: Positive for chest pain.  Gastrointestinal: Positive for nausea. Negative for vomiting.  Neurological: Positive for dizziness. Negative for headaches.  Ten systems are reviewed and are negative for acute change except as noted in the HPI    Physical Exam Updated Vital Signs BP 126/76   Pulse 68   Temp 97.8 F (36.6 C) (Oral)   Resp 17   SpO2 97%   Physical Exam  Constitutional: He is oriented to person, place, and time. He appears well-developed and well-nourished. No distress.  HENT:  Head: Normocephalic and atraumatic.  Nose: Nose normal.  Eyes: Conjunctivae and EOM are normal. Pupils are equal, round, and reactive to light. Right eye exhibits no discharge. Left eye exhibits no discharge. No scleral icterus.  Neck: Normal range of motion. Neck supple.  Cardiovascular: Normal rate and regular rhythm.  Exam reveals no gallop  and no friction rub.   No murmur heard. Pulmonary/Chest: Effort normal and breath sounds normal. No stridor. No respiratory distress. He has no rales.  Abdominal: Soft. He exhibits no distension. There is no tenderness.  Musculoskeletal: He exhibits no edema or tenderness.  Neurological: He is alert and oriented to person, place, and time.  Skin: Skin is warm and dry. No rash noted. He is not diaphoretic. No erythema.  Psychiatric: He has a normal mood and affect.  Vitals reviewed.    ED Treatments / Results  Labs (all labs ordered are listed, but only abnormal results are displayed) Labs Reviewed  BASIC METABOLIC PANEL - Abnormal; Notable for the following:       Result Value   Creatinine, Ser 1.33 (*)    GFR calc non Af Amer 49 (*)    GFR calc Af Amer 57 (*)    All other components within  normal limits  D-DIMER, QUANTITATIVE (NOT AT [facility]) - Abnormal; Notable for the following:    D-Dimer, Quant 3.29 (*)    All other components within normal limits  CBC  I-STAT TROPOININ, ED    EKG  EKG Interpretation  Date/Time:  Thursday November 27 2016 14:48:57 EDT Ventricular Rate:  67 PR Interval:  150 QRS Duration: 82 QT Interval:  384 QTC Calculation: 405 R Axis:   31 Text Interpretation:  Normal sinus rhythm Cannot rule out Anterior infarct , age undetermined Abnormal ECG NO STEMI No significant change since last tracing Confirmed by [facility] MD, [place] [phone]) on 11/27/2016 3:59:36 PM       Radiology Dg Chest 2 View  Result Date: 11/27/2016 CLINICAL DATA:  Chest pain EXAM: CHEST  2 VIEW COMPARISON:  08/01/2010 FINDINGS: Cardiomediastinal silhouette is stable. No infiltrate or pulmonary edema. Mild hyperinflation. There is probable chronic mild reticular interstitial prominence bilaterally. Question mild fibrotic changes bilateral peripheral. IMPRESSION: No infiltrate or pulmonary edema. Probable chronic mild reticular interstitial prominence and peripheral fibrotic changes. Mild  hyperinflation. Electronically Signed   By: Lahoma Crocker M.D.   On: 11/27/2016 15:27   Ct Angio Chest/abd/pel For Dissection W And/or Wo Contrast  Result Date: 11/27/2016 CLINICAL DATA:  Chest pain.  Clinical concern for aortic dissection. EXAM: CT ANGIOGRAPHY CHEST, ABDOMEN AND PELVIS TECHNIQUE: Multidetector CT imaging through the chest, abdomen and pelvis was performed using the standard protocol during bolus administration of intravenous contrast. Multiplanar reconstructed images and MIPs were obtained and reviewed to evaluate the vascular anatomy. CONTRAST:  100 mL of Isovue 370 intravenous contrast COMPARISON:  Current chest radiograph. FINDINGS: CTA CHEST FINDINGS Cardiovascular: No evidence of an aortic dissection or aneurysm. Minor atherosclerotic plaque noted along the arch and descending thoracic aorta. Aortic branch vessels are widely patent. Heart is top-normal in size. There are moderate left coronary artery calcifications. Mediastinum/Nodes: No enlarged mediastinal, hilar, or axillary lymph nodes. Thyroid gland, trachea, and esophagus demonstrate no significant findings. Lungs/Pleura: There is peripheral interstitial thickening mildly more prominent in the lower lungs. Paraseptal emphysema is noted most evident in the upper lobes. There is mild pleuroparenchymal scarring at the apices. No lung masses or suspicious nodules. No evidence of pneumonia or pulmonary edema. No pleural effusion or pneumothorax. Review of the MIP images confirms the above findings. CTA ABDOMEN AND PELVIS FINDINGS VASCULAR Aorta: Normal in caliber. No dissection. There is irregular mostly calcified atherosclerotic plaque throughout the abdominal aorta. Celiac: Minor plaque at the origin. No significant stenosis. No aneurysm. SMA: Patent without evidence of aneurysm, dissection, vasculitis or significant stenosis. Renals: Dual renal arteries on the left, both widely patent. Minor plaque near the origin of the right renal  artery, which is also widely patent. IMA: Patent but small. Inflow: Mild atherosclerotic disease along the common iliac arteries. The external iliac arteries are widely patent. No significant stenosis of the common femoral arteries. Veins: No obvious venous abnormality within the limitations of this arterial phase study. Review of the MIP images confirms the above findings. NON-VASCULAR Hepatobiliary: No focal liver abnormality is seen. No gallstones, gallbladder wall thickening, or biliary dilatation. Pancreas: Unremarkable. No pancreatic ductal dilatation or surrounding inflammatory changes. Spleen: Normal in size without focal abnormality. Adrenals/Urinary Tract: Adrenal glands are unremarkable. Kidneys are normal, without renal calculi, focal lesion, or hydronephrosis. Bladder is unremarkable. Stomach/Bowel: Stomach and small bowel are unremarkable. There are scattered colonic diverticula mostly on the left. No diverticulitis. No other colon abnormality. Lymphatic: No adenopathy. Reproductive: Prostate is mildly  enlarged. Other: No abdominal wall hernia.  No ascites. MUSCULOSKELETAL FINDINGS No fracture or acute finding. No osteoblastic or osteolytic lesions. Significant degenerative changes noted at L4-L5. Review of the MIP images confirms the above findings. IMPRESSION: 1. No aortic dissection or aneurysm. 2. No acute findings in the chest, abdomen or pelvis. 3. There is aortic atherosclerosis, with no significant stenosis. Aortic branch vessels are widely patent. 4. Peripheral interstitial thickening consistent with fibrosis as well as changes of paraseptal emphysema. Electronically Signed   By: Lajean Manes M.D.   On: 11/27/2016 20:18    Procedures Procedures (including critical care time)  Medications Ordered in ED Medications  aspirin chewable tablet 324 mg (324 mg Oral Given 11/27/16 1802)  iopamidol (ISOVUE-370) 76 % injection (100 mLs  Contrast Given 11/27/16 1925)     Initial Impression /  Assessment and Plan / ED Course  I have reviewed the triage vital signs and the nursing notes.  Pertinent labs & imaging results that were available during my care of the patient were reviewed by me and considered in my medical decision making (see chart for details).     Exertional chest pain now currently resolved. EKG without acute ischemic changes or evidence of pericarditis. Initial troponin negative. HEAR >3. Will require admission for ACS rule out if other work up negative.  Chest x-ray without evidence suggestive of pneumonia, pneumothorax, pneumomediastinum.  No abnormal contour of the mediastinum to suggest dissection. No evidence of acute injuries.  Unable to [facility]. Dimer elevated. CTA obtained which did not reveal evidence of pulmonary embolism or aortic dissection. No evidence of pneumonia, pleural effusion.  Hospitalist consulted who will admit for further work and management.  Final Clinical Impressions(s) / ED Diagnoses   Final diagnoses:  Nonspecific chest pain      Fatima Blank, MD 11/27/16 2046

## 2016-11-27 NOTE — H&P (Signed)
History and Physical    Jason Farmer VFI:[id number] DOB: 1935-03-27 DOA: 11/27/2016  PCP: Dorothyann Peng, NP  Patient coming from: Home  I have personally briefly reviewed patient's old medical records in [place]  Chief Complaint: Chest pain  HPI: Jason Farmer is a 82 y.o. male with medical history significant of HTN, TIA.  Patient presents to the ED with c/o chest pain.  Pain onset around 1pm, lasted about 1.5 hours.  Moderate in intensity.  Onset with doing yard work.  Pain was sharp without radiation.  Associated dizziness, fatigue, nausea.  No fever, no headaches, no edema, no SOB, no vomiting.   ED Course: Trop neg x1.  Did have d-dimer of 3.x but CT angio chest abd pel is neg for dissection, PE, or other acute finding.  Pain has resolved at this time.   Review of Systems: As per HPI otherwise 10 point review of systems negative.   Past Medical History:  Diagnosis Date  . Anxiety   . Arthritis   . Back pain   . Essential tremor   . GERD (gastroesophageal reflux disease)   . Glaucoma   . HOH (hard of hearing)   . Hyperlipemia   . Seasonal allergies   . TIA (transient ischemic attack) 04/2015  . Wears glasses   . Wears hearing aid     Past Surgical History:  Procedure Laterality Date  . APPENDECTOMY    . CARPAL TUNNEL RELEASE Right 10/12/2013   Procedure: RIGHT CARPAL TUNNEL RELEASE;  Surgeon: Wynonia Sours, MD;  Location: [facility];  Service: Orthopedics;  Laterality: Right;  . CERVICAL FUSION  2007  . COLONOSCOPY    . EYE SURGERY     both cataracts  . HEMORRHOID SURGERY    . KNEE ARTHROSCOPY     left  . NASAL SEPTOPLASTY W/ TURBINOPLASTY Bilateral 02/15/2013   Procedure: NASAL SEPTOPLASTY WITH BILATER TURBINATE REDUCTION;  Surgeon: Ascencion Dike, MD;  Location: [facility];  Service: ENT;  Laterality: Bilateral;  . TONSILLECTOMY    . TRIGGER FINGER RELEASE  06/08/2012   Procedure: RELEASE TRIGGER FINGER/A-1 PULLEY;   Surgeon: Wynonia Sours, MD;  Location: [facility];  Service: Orthopedics;  Laterality: Left;  Release A-1 Pulley Left Long Finger  . TRIGGER FINGER RELEASE  07/14/2012   Procedure: RELEASE TRIGGER FINGER/A-1 PULLEY;  Surgeon: Wynonia Sours, MD;  Location: [place];  Service: Orthopedics;  Laterality: Right;  . TRIGGER FINGER RELEASE Right 10/12/2013   Procedure: RELEASE TRIGGER FINGER/A-1 PULLEY RIGHT MIDDLE FINGER;  Surgeon: Wynonia Sours, MD;  Location: [place];  Service: Orthopedics;  Laterality: Right;  . UPPER GASTROINTESTINAL ENDOSCOPY       reports that he quit smoking about 32 years ago. He quit smokeless tobacco use about 30 years ago. He reports that he does not drink alcohol or use drugs.  Allergies  Allergen Reactions  . Statins Other (See Comments)    Muscle cramps    Family History  Problem Relation Age of Onset  . Dementia Father   . Heart failure Father   . Glaucoma Mother   . Colon cancer Neg Hx      Prior to Admission medications   Medication Sig Start Date End Date Taking? Authorizing Provider  aspirin 325 MG EC tablet Take 650 mg by mouth every 6 (six) hours as needed for pain.   Yes Historical Provider, MD  aspirin EC 81  MG tablet Take 81 mg by mouth daily.   Yes Historical Provider, MD  atenolol (TENORMIN) 25 MG tablet TAKE 1 TABLET ONCE DAILY. Patient taking differently: TAKE 1/2 TABLET EVERY OTHER DAY 01/15/16  Yes Dorothyann Peng, NP  latanoprost (XALATAN) 0.005 % ophthalmic solution Place 1 drop into both eyes at bedtime.    Yes Historical Provider, MD  loratadine (CLARITIN) 10 MG tablet Take 1 tablet (10 mg total) by mouth daily. 09/14/15  Yes Dorothyann Peng, NP  omeprazole (PRILOSEC) 40 MG capsule Take 1 capsule (40 mg total) by mouth daily. 09/14/15  Yes Dorothyann Peng, NP  rosuvastatin (CRESTOR) 20 MG tablet Take 1 tablet (20 mg total) by mouth daily. Patient taking differently: Take 10 mg by mouth daily.  09/16/16   Yes Dorothyann Peng, NP    Physical Exam: Vitals:   11/27/16 2000 11/27/16 2015 11/27/16 2030 11/27/16 2045  BP: 131/69 126/76 (!) 143/84 139/88  Pulse: 61 68 76 75  Resp: 15 17 18 15   Temp:      TempSrc:      SpO2: 96% 97% 97% 98%    Constitutional: NAD, calm, comfortable Eyes: PERRL, lids and conjunctivae normal ENMT: Mucous membranes are moist. Posterior pharynx clear of any exudate or lesions.Normal dentition.  Neck: normal, supple, no masses, no thyromegaly Respiratory: clear to auscultation bilaterally, no wheezing, no crackles. Normal respiratory effort. No accessory muscle use.  Cardiovascular: Regular rate and rhythm, no murmurs / rubs / gallops. No extremity edema. 2+ pedal pulses. No carotid bruits.  Abdomen: no tenderness, no masses palpated. No hepatosplenomegaly. Bowel sounds positive.  Musculoskeletal: no clubbing / cyanosis. No joint deformity upper and lower extremities. Good ROM, no contractures. Normal muscle tone.  Skin: no rashes, lesions, ulcers. No induration Neurologic: CN 2-12 grossly intact. Sensation intact, DTR normal. Strength 5/5 in all 4.  Psychiatric: Normal judgment and insight. Alert and oriented x 3. Normal mood.    Labs on Admission: I have personally reviewed following labs and imaging studies  CBC:  Recent Labs Lab 11/27/16 1459  WBC 9.4  HGB 14.2  HCT 43.0  MCV 90.3  PLT 785   Basic Metabolic Panel:  Recent Labs Lab 11/27/16 1459  NA 136  K 4.2  CL 104  CO2 26  GLUCOSE 86  BUN 14  CREATININE 1.33*  CALCIUM 9.1   GFR: CrCl cannot be calculated (Unknown ideal weight.). Liver Function Tests: No results for input(s): AST, ALT, ALKPHOS, BILITOT, PROT, ALBUMIN in the last 168 hours. No results for input(s): LIPASE, AMYLASE in the last 168 hours. No results for input(s): AMMONIA in the last 168 hours. Coagulation Profile: No results for input(s): INR, PROTIME in the last 168 hours. Cardiac Enzymes: No results for input(s):  CKTOTAL, CKMB, CKMBINDEX, TROPONINI in the last 168 hours. BNP (last 3 results) No results for input(s): PROBNP in the last 8760 hours. HbA1C: No results for input(s): HGBA1C in the last 72 hours. CBG: No results for input(s): GLUCAP in the last 168 hours. Lipid Profile: No results for input(s): CHOL, HDL, LDLCALC, TRIG, CHOLHDL, LDLDIRECT in the last 72 hours. Thyroid Function Tests: No results for input(s): TSH, T4TOTAL, FREET4, T3FREE, THYROIDAB in the last 72 hours. Anemia Panel: No results for input(s): VITAMINB12, FOLATE, FERRITIN, TIBC, IRON, RETICCTPCT in the last 72 hours. Urine analysis:    Component Value Date/Time   BILIRUBINUR n 09/16/2016 1048   PROTEINUR trace 09/16/2016 1048   UROBILINOGEN 0.2 09/16/2016 1048   NITRITE n 09/16/2016 1048  LEUKOCYTESUR Negative 09/16/2016 1048    Radiological Exams on Admission: Dg Chest 2 View  Result Date: 11/27/2016 CLINICAL DATA:  Chest pain EXAM: CHEST  2 VIEW COMPARISON:  08/01/2010 FINDINGS: Cardiomediastinal silhouette is stable. No infiltrate or pulmonary edema. Mild hyperinflation. There is probable chronic mild reticular interstitial prominence bilaterally. Question mild fibrotic changes bilateral peripheral. IMPRESSION: No infiltrate or pulmonary edema. Probable chronic mild reticular interstitial prominence and peripheral fibrotic changes. Mild hyperinflation. Electronically Signed   By: Lahoma Crocker M.D.   On: 11/27/2016 15:27   Ct Angio Chest/abd/pel For Dissection W And/or Wo Contrast  Result Date: 11/27/2016 CLINICAL DATA:  Chest pain.  Clinical concern for aortic dissection. EXAM: CT ANGIOGRAPHY CHEST, ABDOMEN AND PELVIS TECHNIQUE: Multidetector CT imaging through the chest, abdomen and pelvis was performed using the standard protocol during bolus administration of intravenous contrast. Multiplanar reconstructed images and MIPs were obtained and reviewed to evaluate the vascular anatomy. CONTRAST:  100 mL of Isovue 370  intravenous contrast COMPARISON:  Current chest radiograph. FINDINGS: CTA CHEST FINDINGS Cardiovascular: No evidence of an aortic dissection or aneurysm. Minor atherosclerotic plaque noted along the arch and descending thoracic aorta. Aortic branch vessels are widely patent. Heart is top-normal in size. There are moderate left coronary artery calcifications. Mediastinum/Nodes: No enlarged mediastinal, hilar, or axillary lymph nodes. Thyroid gland, trachea, and esophagus demonstrate no significant findings. Lungs/Pleura: There is peripheral interstitial thickening mildly more prominent in the lower lungs. Paraseptal emphysema is noted most evident in the upper lobes. There is mild pleuroparenchymal scarring at the apices. No lung masses or suspicious nodules. No evidence of pneumonia or pulmonary edema. No pleural effusion or pneumothorax. Review of the MIP images confirms the above findings. CTA ABDOMEN AND PELVIS FINDINGS VASCULAR Aorta: Normal in caliber. No dissection. There is irregular mostly calcified atherosclerotic plaque throughout the abdominal aorta. Celiac: Minor plaque at the origin. No significant stenosis. No aneurysm. SMA: Patent without evidence of aneurysm, dissection, vasculitis or significant stenosis. Renals: Dual renal arteries on the left, both widely patent. Minor plaque near the origin of the right renal artery, which is also widely patent. IMA: Patent but small. Inflow: Mild atherosclerotic disease along the common iliac arteries. The external iliac arteries are widely patent. No significant stenosis of the common femoral arteries. Veins: No obvious venous abnormality within the limitations of this arterial phase study. Review of the MIP images confirms the above findings. NON-VASCULAR Hepatobiliary: No focal liver abnormality is seen. No gallstones, gallbladder wall thickening, or biliary dilatation. Pancreas: Unremarkable. No pancreatic ductal dilatation or surrounding inflammatory  changes. Spleen: Normal in size without focal abnormality. Adrenals/Urinary Tract: Adrenal glands are unremarkable. Kidneys are normal, without renal calculi, focal lesion, or hydronephrosis. Bladder is unremarkable. Stomach/Bowel: Stomach and small bowel are unremarkable. There are scattered colonic diverticula mostly on the left. No diverticulitis. No other colon abnormality. Lymphatic: No adenopathy. Reproductive: Prostate is mildly enlarged. Other: No abdominal wall hernia.  No ascites. MUSCULOSKELETAL FINDINGS No fracture or acute finding. No osteoblastic or osteolytic lesions. Significant degenerative changes noted at L4-L5. Review of the MIP images confirms the above findings. IMPRESSION: 1. No aortic dissection or aneurysm. 2. No acute findings in the chest, abdomen or pelvis. 3. There is aortic atherosclerosis, with no significant stenosis. Aortic branch vessels are widely patent. 4. Peripheral interstitial thickening consistent with fibrosis as well as changes of paraseptal emphysema. Electronically Signed   By: Lajean Manes M.D.   On: 11/27/2016 20:18    EKG: Independently reviewed.  Assessment/Plan Active  Problems:   Chest pain, rule out acute myocardial infarction    1. Chest pain rule out - 1. Cp obs pathway 2. Serial trops 3. Tele monitor 4. NPO after midnight 5. Stress test ordered for tomorrow  DVT prophylaxis: Lovenox Code Status: Full Family Communication: No family in room Disposition Plan: Home, likely tomorrow after stress test if negative Consults called: None Admission status: Place in obs   [place] Date, [facility] Pager [phone]  If 7AM-7PM, please contact day team taking care of patient [email or web address] Password [email or web address]  11/27/2016, 9:04 PM

## 2016-11-27 NOTE — ED Triage Notes (Signed)
PT is here with chest pain that started at 1130 for almost 1.5 hours and saw his MD and sent him here.

## 2016-11-27 NOTE — ED Notes (Signed)
Pt given sandwich and diet coke per Dr Leonette Monarch

## 2016-11-27 NOTE — ED Notes (Signed)
Patient transported to CT 

## 2016-11-28 ENCOUNTER — Observation Stay (HOSPITAL_BASED_OUTPATIENT_CLINIC_OR_DEPARTMENT_OTHER): Payer: Medicare Other

## 2016-11-28 DIAGNOSIS — E785 Hyperlipidemia, unspecified: Secondary | ICD-10-CM

## 2016-11-28 DIAGNOSIS — R079 Chest pain, unspecified: Secondary | ICD-10-CM

## 2016-11-28 DIAGNOSIS — K219 Gastro-esophageal reflux disease without esophagitis: Secondary | ICD-10-CM

## 2016-11-28 LAB — NM MYOCAR MULTI W/SPECT W/WALL MOTION / EF
Estimated workload: 1 METS
Exercise duration (min): 5 min
Exercise duration (sec): 0 s
LV dias vol: 88 mL (ref 62–150)
LV sys vol: 32 mL
MPHR: 141 {beats}/min
Peak BP: 122 mmHg
Peak HR: 76 {beats}/min
Percent HR: 65 %
Percent of predicted max HR: 53 %
RATE: 0.13
Rest BP: 134 mmHg
Rest HR: 70 {beats}/min
SDS: 5
SRS: 2
SSS: 5
Stage 1 DBP: 77 mmHg
Stage 1 Grade: 0 %
Stage 1 HR: 70 {beats}/min
Stage 1 SBP: 134 mmHg
Stage 1 Speed: 0 mph
Stage 2 Grade: 0 %
Stage 2 HR: 69 {beats}/min
Stage 2 Speed: 0 mph
Stage 3 DBP: 81 mmHg
Stage 3 Grade: 0 %
Stage 3 HR: 82 {beats}/min
Stage 3 SBP: 134 mmHg
Stage 3 Speed: 0 mph
Stage 4 DBP: 73 mmHg
Stage 4 Grade: 0 %
Stage 4 HR: 76 {beats}/min
Stage 4 SBP: 122 mmHg
Stage 4 Speed: 0 mph
TID: 1.32

## 2016-11-28 LAB — TROPONIN I
Troponin I: 0.08 ng/mL (ref <0.03)
Troponin I: 0.11 ng/mL (ref <0.03)
Troponin I: 0.14 ng/mL (ref <0.03)

## 2016-11-28 MED ORDER — REGADENOSON 0.4 MG/5ML IV SOLN
0.4000 mg | Freq: Once | INTRAVENOUS | Status: AC
  Administered 2016-11-28: 0.4 mg via INTRAVENOUS
  Filled 2016-11-28: qty 5

## 2016-11-28 MED ORDER — TECHNETIUM TC 99M TETROFOSMIN IV KIT
10.0000 | PACK | Freq: Once | INTRAVENOUS | Status: AC | PRN
  Administered 2016-11-28: 10 via INTRAVENOUS

## 2016-11-28 MED ORDER — REGADENOSON 0.4 MG/5ML IV SOLN
INTRAVENOUS | Status: AC
  Filled 2016-11-28: qty 5

## 2016-11-28 MED ORDER — TECHNETIUM TC 99M TETROFOSMIN IV KIT: 30.0000 | PACK | Freq: Once | INTRAVENOUS | Status: DC | PRN

## 2016-11-28 MED ORDER — TECHNETIUM TC 99M TETROFOSMIN IV KIT
30.0000 | PACK | Freq: Once | INTRAVENOUS | Status: AC | PRN
  Administered 2016-11-28: 30 via INTRAVENOUS

## 2016-11-28 NOTE — Progress Notes (Signed)
Pt with bruising to right forearm IV. States it has been like this since started. Able to flush with no resistance, denies any pain.

## 2016-11-28 NOTE — Discharge Summary (Signed)
Physician Discharge Summary  Jason Farmer VOZ:[id number] DOB: 05-12-1935 DOA: 11/27/2016  PCP: Dorothyann Peng, NP  Admit date: 11/27/2016 Discharge date: 11/28/2016  Time spent: 45 minutes  Recommendations for Outpatient Follow-up:  Patient will be discharged to home.  Patient will need to follow up with primary care provider within one week of discharge. Follow up with cardiology, office will contact you for an appointment. Patient should continue medications as prescribed.  Patient should follow a heart healthy diet.   Discharge Diagnoses:  Chest pain/Mildly elevated troponin Hyperlipidemia GERD  Discharge Condition: Stable  Diet recommendation: heart healthy  Filed Weights   11/27/16 2202  Weight: 70.2 kg (154 lb 12.8 oz)    History of present illness:  On 11/27/2016 by Dr. Jennette Kettle Jason Farmer is a 82 y.o. male with medical history significant of HTN, TIA.  Patient presents to the ED with c/o chest pain.  Pain onset around 1pm, lasted about 1.5 hours.  Moderate in intensity.  Onset with doing yard work.  Pain was sharp without radiation.  Associated dizziness, fatigue, nausea.  No fever, no headaches, no edema, no SOB, no vomiting.  Hospital Course:  Chest pain/Mildly elevated troponin -Chest pain resolved prior to coming to the hospital. -Heart score 5 -Troponin cycled, peaked at 0.14, trending downward- 0.08 -Stress test showed no ST segment deviation, low risk study, EF 64%, normal wall motion, no T-wave inversions -Continue aspirin, beta blocker, statin  Hyperlipidemia -Continue statin  GERD -Continue PPI  Procedures:  Nuclear stress test  Consultations:  Cardiology  Discharge Exam: Vitals:   11/28/16 0908 11/28/16 1256  BP:  (!) 119/58  Pulse: 79 63  Resp:  20  Temp:  98 F (36.7 C)     General: Well developed, well nourished, NAD, appears stated age  HEENT: NCAT, mucous membranes moist.  Neck: Supple, no JVD, no  masses  Cardiovascular: S1 S2 auscultated, no rubs, murmurs or gallops. Regular rate and rhythm.  Respiratory: Clear to auscultation bilaterally with equal chest rise  Abdomen: Soft, nontender, nondistended, + bowel sounds  Extremities: warm dry without cyanosis clubbing or edema  Neuro: AAOx3, cranial nerves grossly intact. Strength 5/5 in patient's upper and lower extremities bilaterally  Psych: Normal affect and demeanor with intact judgement and insight  Discharge Instructions Discharge Instructions    Discharge instructions    Complete by:  As directed    Patient will be discharged to home.  Patient will need to follow up with primary care provider within one week of discharge. Follow up with cardiology, office will contact you for an appointment. Patient should continue medications as prescribed.  Patient should follow a heart healthy diet.     Current Discharge Medication List    CONTINUE these medications which have NOT CHANGED   Details  aspirin EC 81 MG tablet Take 81 mg by mouth daily.    atenolol (TENORMIN) 25 MG tablet TAKE 1 TABLET ONCE DAILY. Qty: 90 tablet, Refills: 2    latanoprost (XALATAN) 0.005 % ophthalmic solution Place 1 drop into both eyes at bedtime.     loratadine (CLARITIN) 10 MG tablet Take 1 tablet (10 mg total) by mouth daily. Qty: 90 tablet, Refills: 3   Associated Diagnoses: Medication refill    omeprazole (PRILOSEC) 40 MG capsule Take 1 capsule (40 mg total) by mouth daily. Qty: 90 capsule, Refills: 3   Associated Diagnoses: Gastroesophageal reflux disease without esophagitis; Medication refill    rosuvastatin (CRESTOR) 20 MG tablet Take 1 tablet (  20 mg total) by mouth daily. Qty: 90 tablet, Refills: 1   Associated Diagnoses: Hyperlipidemia, unspecified hyperlipidemia type       Allergies  Allergen Reactions  . Statins Other (See Comments)    Muscle cramps   Follow-up Information    Dorothyann Peng, NP. Schedule an appointment as soon  as possible for a visit in 1 week(s).   Specialty:  Family Medicine Why:  Hospital follow up Contact information: [place] [address] [phone]        [facility] Follow up.   Why:  Office will contact you with an appointment.  Contact information: [place] [address] [phone]           The results of significant diagnostics from this hospitalization (including imaging, microbiology, ancillary and laboratory) are listed below for reference.    Significant Diagnostic Studies: Dg Chest 2 View  Result Date: 11/27/2016 CLINICAL DATA:  Chest pain EXAM: CHEST  2 VIEW COMPARISON:  08/01/2010 FINDINGS: Cardiomediastinal silhouette is stable. No infiltrate or pulmonary edema. Mild hyperinflation. There is probable chronic mild reticular interstitial prominence bilaterally. Question mild fibrotic changes bilateral peripheral. IMPRESSION: No infiltrate or pulmonary edema. Probable chronic mild reticular interstitial prominence and peripheral fibrotic changes. Mild hyperinflation. Electronically Signed   By: Lahoma Crocker M.D.   On: 11/27/2016 15:27   Nm Myocar Multi W/spect W/wall Motion / Ef  Addendum Date: 11/28/2016    There was no ST segment deviation noted during stress.  This is a low risk study.  Nuclear stress EF: 64%.  No T wave inversion was noted during stress.  TID 1.32  No reversible ischemia. LVEF 64% with normal wall motion.  Abnormal TID ratio of 1.32. This is a low risk study.   Result Date: 11/28/2016  There was no ST segment deviation noted during stress.  This is a low risk study.  Nuclear stress EF: 64%.  No T wave inversion was noted during stress.  No reversible ischemia. LVEF 64% with normal wall motion. This is a low risk study.   Ct Angio Chest/abd/pel For Dissection W And/or Wo Contrast  Result Date: 11/27/2016 CLINICAL DATA:  Chest pain.   Clinical concern for aortic dissection. EXAM: CT ANGIOGRAPHY CHEST, ABDOMEN AND PELVIS TECHNIQUE: Multidetector CT imaging through the chest, abdomen and pelvis was performed using the standard protocol during bolus administration of intravenous contrast. Multiplanar reconstructed images and MIPs were obtained and reviewed to evaluate the vascular anatomy. CONTRAST:  100 mL of Isovue 370 intravenous contrast COMPARISON:  Current chest radiograph. FINDINGS: CTA CHEST FINDINGS Cardiovascular: No evidence of an aortic dissection or aneurysm. Minor atherosclerotic plaque noted along the arch and descending thoracic aorta. Aortic branch vessels are widely patent. Heart is top-normal in size. There are moderate left coronary artery calcifications. Mediastinum/Nodes: No enlarged mediastinal, hilar, or axillary lymph nodes. Thyroid gland, trachea, and esophagus demonstrate no significant findings. Lungs/Pleura: There is peripheral interstitial thickening mildly more prominent in the lower lungs. Paraseptal emphysema is noted most evident in the upper lobes. There is mild pleuroparenchymal scarring at the apices. No lung masses or suspicious nodules. No evidence of pneumonia or pulmonary edema. No pleural effusion or pneumothorax. Review of the MIP images confirms the above findings. CTA ABDOMEN AND PELVIS FINDINGS VASCULAR Aorta: Normal in caliber. No dissection. There is irregular mostly calcified atherosclerotic plaque throughout the abdominal aorta. Celiac: Minor plaque at the origin. No significant stenosis. No aneurysm. SMA:  Patent without evidence of aneurysm, dissection, vasculitis or significant stenosis. Renals: Dual renal arteries on the left, both widely patent. Minor plaque near the origin of the right renal artery, which is also widely patent. IMA: Patent but small. Inflow: Mild atherosclerotic disease along the common iliac arteries. The external iliac arteries are widely patent. No significant stenosis of the  common femoral arteries. Veins: No obvious venous abnormality within the limitations of this arterial phase study. Review of the MIP images confirms the above findings. NON-VASCULAR Hepatobiliary: No focal liver abnormality is seen. No gallstones, gallbladder wall thickening, or biliary dilatation. Pancreas: Unremarkable. No pancreatic ductal dilatation or surrounding inflammatory changes. Spleen: Normal in size without focal abnormality. Adrenals/Urinary Tract: Adrenal glands are unremarkable. Kidneys are normal, without renal calculi, focal lesion, or hydronephrosis. Bladder is unremarkable. Stomach/Bowel: Stomach and small bowel are unremarkable. There are scattered colonic diverticula mostly on the left. No diverticulitis. No other colon abnormality. Lymphatic: No adenopathy. Reproductive: Prostate is mildly enlarged. Other: No abdominal wall hernia.  No ascites. MUSCULOSKELETAL FINDINGS No fracture or acute finding. No osteoblastic or osteolytic lesions. Significant degenerative changes noted at L4-L5. Review of the MIP images confirms the above findings. IMPRESSION: 1. No aortic dissection or aneurysm. 2. No acute findings in the chest, abdomen or pelvis. 3. There is aortic atherosclerosis, with no significant stenosis. Aortic branch vessels are widely patent. 4. Peripheral interstitial thickening consistent with fibrosis as well as changes of paraseptal emphysema. Electronically Signed   By: Lajean Manes M.D.   On: 11/27/2016 20:18    Microbiology: No results found for this or any previous visit (from the past 240 hour(s)).   Labs: Basic Metabolic Panel:  Recent Labs Lab 11/27/16 1459  NA 136  K 4.2  CL 104  CO2 26  GLUCOSE 86  BUN 14  CREATININE 1.33*  CALCIUM 9.1   Liver Function Tests: No results for input(s): AST, ALT, ALKPHOS, BILITOT, PROT, ALBUMIN in the last 168 hours. No results for input(s): LIPASE, AMYLASE in the last 168 hours. No results for input(s): AMMONIA in the last  168 hours. CBC:  Recent Labs Lab 11/27/16 1459  WBC 9.4  HGB 14.2  HCT 43.0  MCV 90.3  PLT 253   Cardiac Enzymes:  Recent Labs Lab 11/27/16 2121 11/28/16 0003 11/28/16 0307 11/28/16 1331  TROPONINI 0.10* 0.11* 0.14* 0.08*   BNP: BNP (last 3 results) No results for input(s): BNP in the last 8760 hours.  ProBNP (last 3 results) No results for input(s): PROBNP in the last 8760 hours.  CBG: No results for input(s): GLUCAP in the last 168 hours.     SignedCristal Ford  [facility] 11/28/2016, 2:58 PM

## 2016-11-28 NOTE — Progress Notes (Signed)
   [facility] was called to assist with NST that was ordered by Dr. Jennette Kettle, Internal Medicine. Patient tolerated the test well. Radiologist interpretation pending. [facility] will f/u with the results. If abnormal stress test findings, we will follow with formal consultation.  Jason Farmer [name] 11/28/2016

## 2016-11-28 NOTE — Discharge Instructions (Signed)
Nonspecific Chest Pain °Chest pain can be caused by many different conditions. There is always a chance that your pain could be related to something serious, such as a heart attack or a blood clot in your lungs. Chest pain can also be caused by conditions that are not life-threatening. If you have chest pain, it is very important to follow up with your health care provider. °What are the causes? °Causes of this condition include: °· Heartburn. °· Pneumonia or bronchitis. °· Anxiety or stress. °· Inflammation around your heart (pericarditis) or lung (pleuritis or pleurisy). °· A blood clot in your lung. °· A collapsed lung (pneumothorax). This can develop suddenly on its own (spontaneous pneumothorax) or from trauma to the chest. °· Shingles infection (varicella-zoster virus). °· Heart attack. °· Damage to the bones, muscles, and cartilage that make up your chest wall. This can include: °? Bruised bones due to injury. °? Strained muscles or cartilage due to frequent or repeated coughing or overwork. °? Fracture to one or more ribs. °? Sore cartilage due to inflammation (costochondritis). ° °What increases the risk? °Risk factors for this condition may include: °· Activities that increase your risk for trauma or injury to your chest. °· Respiratory infections or conditions that cause frequent coughing. °· Medical conditions or overeating that can cause heartburn. °· Heart disease or family history of heart disease. °· Conditions or health behaviors that increase your risk of developing a blood clot. °· Having had chicken pox (varicella zoster). ° °What are the signs or symptoms? °Chest pain can feel like: °· Burning or tingling on the surface of your chest or deep in your chest. °· Crushing, pressure, aching, or squeezing pain. °· Dull or sharp pain that is worse when you move, cough, or take a deep breath. °· Pain that is also felt in your back, neck, shoulder, or arm, or pain that spreads to any of these  areas. ° °Your chest pain may come and go, or it may stay constant. °How is this diagnosed? °Lab tests or other studies may be needed to find the cause of your pain. Your health care provider may have you take a test called an ECG (electrocardiogram). An ECG records your heartbeat patterns at the time the test is performed. You may also have other tests, such as: °· Transthoracic echocardiogram (TTE). In this test, sound waves are used to create a picture of the heart structures and to look at how blood flows through your heart. °· Transesophageal echocardiogram (TEE). This is a more advanced imaging test that takes images from inside your body. It allows your health care provider to see your heart in finer detail. °· Cardiac monitoring. This allows your health care provider to monitor your heart rate and rhythm in real time. °· Holter monitor. This is a portable device that records your heartbeat and can help to diagnose abnormal heartbeats. It allows your health care provider to track your heart activity for several days, if needed. °· Stress tests. These can be done through exercise or by taking medicine that makes your heart beat more quickly. °· Blood tests. °· Other imaging tests. ° °How is this treated? °Treatment depends on what is causing your chest pain. Treatment may include: °· Medicines. These may include: °? Acid blockers for heartburn. °? Anti-inflammatory medicine. °? Pain medicine for inflammatory conditions. °? Antibiotic medicine, if an infection is present. °? Medicines to dissolve blood clots. °? Medicines to treat coronary artery disease (CAD). °· Supportive care for conditions that   do not require medicines. This may include: °? Resting. °? Applying heat or cold packs to injured areas. °? Limiting activities until pain decreases. ° °Follow these instructions at home: °Medicines °· If you were prescribed an antibiotic, take it as told by your health care provider. Do not stop taking the  antibiotic even if you start to feel better. °· Take over-the-counter and prescription medicines only as told by your health care provider. °Lifestyle °· Do not use any products that contain nicotine or tobacco, such as cigarettes and e-cigarettes. If you need help quitting, ask your health care provider. °· Do not drink alcohol. °· Make lifestyle changes as directed by your health care provider. These may include: °? Getting regular exercise. Ask your health care provider to suggest some activities that are safe for you. °? Eating a heart-healthy diet. A registered dietitian can help you to learn healthy eating options. °? Maintaining a healthy weight. °? Managing diabetes, if necessary. °? Reducing stress, such as with yoga or relaxation techniques. °General instructions °· Avoid any activities that bring on chest pain. °· If heartburn is the cause for your chest pain, raise (elevate) the head of your bed about 6 inches (15 cm) by putting blocks under the legs. Sleeping with more pillows does not effectively relieve heartburn because it only changes the position of your head. °· Keep all follow-up visits as told by your health care provider. This is important. This includes any further testing if your chest pain does not go away. °Contact a health care provider if: °· Your chest pain does not go away. °· You have a rash with blisters on your chest. °· You have a fever. °· You have chills. °Get help right away if: °· Your chest pain is worse. °· You have a cough that gets worse, or you cough up blood. °· You have severe pain in your abdomen. °· You have severe weakness. °· You faint. °· You have sudden, unexplained chest discomfort. °· You have sudden, unexplained discomfort in your arms, back, neck, or jaw. °· You have shortness of breath at any time. °· You suddenly start to sweat, or your skin gets clammy. °· You feel nauseous or you vomit. °· You suddenly feel light-headed or dizzy. °· Your heart begins to beat  quickly, or it feels like it is skipping beats. °These symptoms may represent a serious problem that is an emergency. Do not wait to see if the symptoms will go away. Get medical help right away. Call your local emergency services (911 in the [place] Do not drive yourself to the hospital. °This information is not intended to replace advice given to you by your health care provider. Make sure you discuss any questions you have with your health care provider. °Document Released: 05/21/2005 Document Revised: 05/05/2016 Document Reviewed: 05/05/2016 °Elsevier Interactive Patient Education © 2017 [employer] ° °

## 2016-12-02 ENCOUNTER — Telehealth: Payer: Self-pay

## 2016-12-02 NOTE — Telephone Encounter (Signed)
D/C 11/28/16 To: home  Spoke with pt and he states that he is doing well. He denies any recurrent chest pain. He is already scheduled to see cardiology 12/08/16. He would like to follow up with them before seeing [facility]. Pt aware to contact office if he needs anything before seeing cardiology.   Nothing further needed at this time.

## 2016-12-03 NOTE — Progress Notes (Signed)
Cardiology Office Note   Date:  12/08/2016   ID:  Jason Farmer, DOB 30-Sep-1934, MRN [id number]  PCP:  Dorothyann Peng, NP  Cardiologist:   Jenkins Rouge, MD   Chief Complaint  Patient presents with  . Chest Pain      History of Present Illness: Jason Farmer is a 82 y.o. male who presents for evaluation/consultation of chest pain Referred by Dorothyann Peng NP. And Cristal Ford DO  Seen at [facility] 11/27/16. Acute onset of moderate SSCP doing yard work. Sharp associated with dizziness, fatigue and nausea. No dyspnea. Troponin .14  D dimer 3/29 CTA negative PE or aortic pathology personally reviewed images. Pain resolved spontaneously.  CRF;s elevated lipids Had a f/u lexiscan myovue with no ischemia EF 64% noted TID 1.32 Personally reviewed images. Review of his CT images done to r/o PE showed ostial and proximal LAD calcium.   Carries diagnosis of TIA review of ER visit 04/24/15 indicated presentation with transient memory loss and confusion Symptoms lasted about 90 minutes Carotids no significant stenosis CT negative and f/u MRI negative.  Since d/c no symptoms Very active and no chest pain with activity. Long discussion about troponin and TID Told him there was a small change of balanced ischemia We both agreed to observe for now unless Symptoms recur. Would consider cardiac CTA or cath if he has more pain Discussed nitro use and script called In    Past Medical History:  Diagnosis Date  . Anxiety    "not a problem for a long time" (11/27/2016)  . Arthritis    "wrists, knees, shoulders, back" (11/27/2016)  . Chronic lower back pain    "5th disc is protruding" (11/27/2016)  . Essential tremor   . GERD (gastroesophageal reflux disease)   . Glaucoma   . Heart murmur    dx'd in [employer] in the 1960s; haven't been seen for it since "(11/27/2016)  . Hepatitis ~ 1958   "jaundice kind"  . HOH (hard of hearing)   . Hyperlipemia   . Migraine    "none since my neck OR" (11/27/2016)  .  Seasonal allergies   . TIA (transient ischemic attack) 04/2015  . Wears glasses   . Wears hearing aid     Past Surgical History:  Procedure Laterality Date  . ANTERIOR CERVICAL DECOMP/DISCECTOMY FUSION  2007  . APPENDECTOMY    . BACK SURGERY    . CARPAL TUNNEL RELEASE Right 10/12/2013   Procedure: RIGHT CARPAL TUNNEL RELEASE;  Surgeon: Wynonia Sours, MD;  Location: [place];  Service: Orthopedics;  Laterality: Right;  . CATARACT EXTRACTION W/ INTRAOCULAR LENS  IMPLANT, BILATERAL Bilateral   . COLONOSCOPY    . HEMORRHOID SURGERY    . KNEE ARTHROSCOPY Left 1970s  . NASAL SEPTOPLASTY W/ TURBINOPLASTY Bilateral 02/15/2013   Procedure: NASAL SEPTOPLASTY WITH BILATER TURBINATE REDUCTION;  Surgeon: Ascencion Dike, MD;  Location: [place];  Service: ENT;  Laterality: Bilateral;  . TONSILLECTOMY    . TRIGGER FINGER RELEASE  06/08/2012   Procedure: RELEASE TRIGGER FINGER/A-1 PULLEY;  Surgeon: Wynonia Sours, MD;  Location: [place];  Service: Orthopedics;  Laterality: Left;  Release A-1 Pulley Left Long Finger  . TRIGGER FINGER RELEASE  07/14/2012   Procedure: RELEASE TRIGGER FINGER/A-1 PULLEY;  Surgeon: Wynonia Sours, MD;  Location: [place];  Service: Orthopedics;  Laterality: Right;  . TRIGGER FINGER RELEASE Right 10/12/2013   Procedure: RELEASE TRIGGER FINGER/A-1 PULLEY  RIGHT MIDDLE FINGER;  Surgeon: Wynonia Sours, MD;  Location: [place];  Service: Orthopedics;  Laterality: Right;  . UPPER GASTROINTESTINAL ENDOSCOPY       Current Outpatient Prescriptions  Medication Sig Dispense Refill  . aspirin EC 81 MG tablet Take 81 mg by mouth daily.    Marland Kitchen atenolol (TENORMIN) 25 MG tablet Take 12.5 mg by mouth daily.    Marland Kitchen latanoprost (XALATAN) 0.005 % ophthalmic solution Place 1 drop into both eyes at bedtime.     Marland Kitchen loratadine (CLARITIN) 10 MG tablet Take 1 tablet (10 mg total) by mouth daily. 90 tablet 3  . omeprazole  (PRILOSEC) 20 MG capsule Take 20 mg by mouth daily.    . rosuvastatin (CRESTOR) 20 MG tablet Take 12.5 mg by mouth every other day.     . nitroGLYCERIN (NITROSTAT) 0.4 MG SL tablet Place 1 tablet (0.4 mg total) under the tongue every 5 (five) minutes as needed for chest pain. 25 tablet 2   No current facility-administered medications for this visit.     Allergies:   Statins    Social History:  The patient  reports that he quit smoking about 32 years ago. His smoking use included Cigarettes. He has a 60.00 pack-year smoking history. He has never used smokeless tobacco. He reports that he does not drink alcohol or use drugs.   Family History:  The patient's family history includes Dementia in his father; Glaucoma in his mother; Heart failure in his father.    ROS:  Please see the history of present illness.   Otherwise, review of systems are positive for none.   All other systems are reviewed and negative.    PHYSICAL EXAM: VS:  BP 130/60   Pulse 82   Ht 5\' 9"  (1.753 m)   Wt 160 lb (72.6 kg)   SpO2 98%   BMI 23.63 kg/m  , BMI Body mass index is 23.63 kg/m. Affect appropriate Healthy:  appears stated age 63: normal Neck supple with no adenopathy JVP normal no bruits no thyromegaly Lungs clear with no wheezing and good diaphragmatic motion Heart:  S1/S2 no murmur, no rub, gallop or click PMI normal Abdomen: benighn, BS positve, no tenderness, no AAA no bruit.  No HSM or HJR Distal pulses intact with no bruits No edema Neuro non-focal Skin warm and dry No muscular weakness    EKG:  NSR normal eCG 11/28/16    Recent Labs: 11/10/2016: ALT 11; TSH 0.95 11/27/2016: BUN 14; Creatinine, Ser 1.33; Hemoglobin 14.2; Platelets 253; Potassium 4.2; Sodium 136    Lipid Panel    Component Value Date/Time   CHOL 235 (H) 09/16/2016 0848   TRIG 122.0 09/16/2016 0848   HDL 51.70 09/16/2016 0848   CHOLHDL 5 09/16/2016 0848   VLDL 24.4 09/16/2016 0848   LDLCALC 159 (H) 09/16/2016  0848   LDLDIRECT 139.5 09/01/2013 0852      Wt Readings from Last 3 Encounters:  12/08/16 160 lb (72.6 kg)  11/27/16 154 lb 12.8 oz (70.2 kg)  11/10/16 157 lb 6 oz (71.4 kg)      Other studies Reviewed: Additional studies/ records that were reviewed today include: Notes , ECG, labs CT ER visit 11/28/16  Old notes TIA 2016 .    ASSESSMENT AND PLAN:  1.  Chest Pain resolved ECG normal despite troponin and ? TID myovue perfusion was normal and symptoms Have not recurred Continue ASA/Beta blocker PRN nitro called in f/u 3 months he is very  reliable patient And will call if he has any further symptoms Plan would be for cardiac CTA or cath  2. TIA seems more like transient global amnesia. Resolved carotid duplex and CT ok  3. Cholesterol continue statin labs with primary  4. GERD low carb diet H2 blocker  5. HTN  Well controlled.  Continue current medications and low sodium Dash type diet.      Current medicines are reviewed at length with the patient today.  The patient does not have concerns regarding medicines.  The following changes have been made:  PRN nitro called in   Labs/ tests ordered today include: none  No orders of the defined types were placed in this encounter.    Disposition:   FU with 3 months      Signed, Jenkins Rouge, MD  12/08/2016 9:17 AM    [facility] [address], [place], [place]  [address] Phone: [phone]; Fax: [phone]

## 2016-12-08 ENCOUNTER — Encounter: Payer: Self-pay | Admitting: Cardiovascular Disease

## 2016-12-08 ENCOUNTER — Ambulatory Visit (INDEPENDENT_AMBULATORY_CARE_PROVIDER_SITE_OTHER): Payer: Medicare Other | Admitting: Cardiovascular Disease

## 2016-12-08 VITALS — BP 130/60 | HR 82 | Ht 69.0 in | Wt 160.0 lb

## 2016-12-08 DIAGNOSIS — R079 Chest pain, unspecified: Secondary | ICD-10-CM

## 2016-12-08 MED ORDER — NITROGLYCERIN 0.4 MG SL SUBL: 0.4000 mg | SUBLINGUAL_TABLET | SUBLINGUAL | 2 refills | Status: DC | PRN

## 2016-12-08 NOTE — Patient Instructions (Addendum)
Medication Instructions:  Your physician has recommended you make the following change in your medication:   START Nitroglycerin 0.4 mg under your tongue as needed for chest pain.  Take 1 NTG, under your tongue, while sitting. If no relief of pain may repeat NTG, one tab every 5 minutes up to 3 tablets total over 15 minutes. If no relief CALL 911. If you have dizziness/lightheadness while taking NTG, stop taking and call 911.  Labwork: NONE  Testing/Procedures: NONE  Follow-Up: Your physician wants you to follow-up in: 3 months with Dr. Johnsie Cancel.   If you need a refill on your cardiac medications before your next appointment, please call your pharmacy.   Nitroglycerin sublingual tablets What is this medicine? NITROGLYCERIN (nye troe GLI ser in) is a type of vasodilator. It relaxes blood vessels, increasing the blood and oxygen supply to your heart. This medicine is used to relieve chest pain caused by angina. It is also used to prevent chest pain before activities like climbing stairs, going outdoors in cold weather, or sexual activity. This medicine may be used for other purposes; ask your health care provider or pharmacist if you have questions. COMMON BRAND NAME(S): Nitroquick, Nitrostat, Nitrotab What should I tell my health care provider before I take this medicine? They need to know if you have any of these conditions: -anemia -head injury, recent stroke, or bleeding in the brain -liver disease -previous heart attack -an unusual or allergic reaction to nitroglycerin, other medicines, foods, dyes, or preservatives -pregnant or trying to get pregnant -breast-feeding How should I use this medicine? Take this medicine by mouth as needed. At the first sign of an angina attack (chest pain or tightness) place one tablet under your tongue. You can also take this medicine 5 to 10 minutes before an event likely to produce chest pain. Follow the directions on the prescription label.  Let the tablet dissolve under the tongue. Do not swallow whole. Replace the dose if you accidentally swallow it. It will help if your mouth is not dry. Saliva around the tablet will help it to dissolve more quickly. Do not eat or drink, smoke or chew tobacco while a tablet is dissolving. If you are not better within 5 minutes after taking ONE dose of nitroglycerin, call 9-1-1 immediately to seek emergency medical care. Do not take more than 3 nitroglycerin tablets over 15 minutes. If you take this medicine often to relieve symptoms of angina, your doctor or health care professional may provide you with different instructions to manage your symptoms. If symptoms do not go away after following these instructions, it is important to call 9-1-1 immediately. Do not take more than 3 nitroglycerin tablets over 15 minutes. Talk to your pediatrician regarding the use of this medicine in children. Special care may be needed. Overdosage: If you think you have taken too much of this medicine contact a poison control center or emergency room at once. NOTE: This medicine is only for you. Do not share this medicine with others. What if I miss a dose? This does not apply. This medicine is only used as needed. What may interact with this medicine? Do not take this medicine with any of the following medications: -certain migraine medicines like ergotamine and dihydroergotamine (DHE) -medicines used to treat erectile dysfunction like sildenafil, tadalafil, and vardenafil -riociguat This medicine may also interact with the following medications: -alteplase -aspirin -heparin -medicines for high blood pressure -medicines for mental depression -other medicines used to treat angina -phenothiazines like chlorpromazine, mesoridazine,  prochlorperazine, thioridazine This list may not describe all possible interactions. Give your health care provider a list of all the medicines, herbs, non-prescription drugs, or dietary  supplements you use. Also tell them if you smoke, drink alcohol, or use illegal drugs. Some items may interact with your medicine. What should I watch for while using this medicine? Tell your doctor or health care professional if you feel your medicine is no longer working. Keep this medicine with you at all times. Sit or lie down when you take your medicine to prevent falling if you feel dizzy or faint after using it. Try to remain calm. This will help you to feel better faster. If you feel dizzy, take several deep breaths and lie down with your feet propped up, or bend forward with your head resting between your knees. You may get drowsy or dizzy. Do not drive, use machinery, or do anything that needs mental alertness until you know how this drug affects you. Do not stand or sit up quickly, especially if you are an older patient. This reduces the risk of dizzy or fainting spells. Alcohol can make you more drowsy and dizzy. Avoid alcoholic drinks. Do not treat yourself for coughs, colds, or pain while you are taking this medicine without asking your doctor or health care professional for advice. Some ingredients may increase your blood pressure. What side effects may I notice from receiving this medicine? Side effects that you should report to your doctor or health care professional as soon as possible: -blurred vision -dry mouth -skin rash -sweating -the feeling of extreme pressure in the head -unusually weak or tired Side effects that usually do not require medical attention (report to your doctor or health care professional if they continue or are bothersome): -flushing of the face or neck -headache -irregular heartbeat, palpitations -nausea, vomiting This list may not describe all possible side effects. Call your doctor for medical advice about side effects. You may report side effects to FDA at 1-800-FDA-1088. Where should I keep my medicine? Keep out of the reach of children. Store at  room temperature between 20 and 25 degrees C (68 and 77 degrees F). Store in [employer] of [employer]. Protect from light and moisture. Keep tightly closed. Throw away any unused medicine after the expiration date. NOTE: This sheet is a summary. It may not cover all possible information. If you have questions about this medicine, talk to your doctor, pharmacist, or health care provider.  2018 Elsevier/Gold Standard (2013-06-09 17:57:36)

## 2016-12-18 DIAGNOSIS — H401131 Primary open-angle glaucoma, bilateral, mild stage: Secondary | ICD-10-CM | POA: Diagnosis not present

## 2016-12-29 DIAGNOSIS — M65351 Trigger finger, right little finger: Secondary | ICD-10-CM | POA: Diagnosis not present

## 2016-12-29 DIAGNOSIS — M65342 Trigger finger, left ring finger: Secondary | ICD-10-CM | POA: Diagnosis not present

## 2017-01-12 ENCOUNTER — Encounter: Payer: Self-pay | Admitting: Cardiovascular Disease

## 2017-01-16 DIAGNOSIS — G43109 Migraine with aura, not intractable, without status migrainosus: Secondary | ICD-10-CM | POA: Diagnosis not present

## 2017-01-17 ENCOUNTER — Other Ambulatory Visit: Payer: Self-pay | Admitting: Adult Health

## 2017-01-20 NOTE — Telephone Encounter (Signed)
Ok to refill for one year  

## 2017-01-20 NOTE — Telephone Encounter (Signed)
Is this ok to refill?  

## 2017-01-22 ENCOUNTER — Ambulatory Visit (INDEPENDENT_AMBULATORY_CARE_PROVIDER_SITE_OTHER): Payer: Medicare Other | Admitting: Adult Health

## 2017-01-22 ENCOUNTER — Encounter: Payer: Self-pay | Admitting: Adult Health

## 2017-01-22 VITALS — BP 120/62 | Temp 97.8°F | Ht 69.0 in | Wt 157.4 lb

## 2017-01-22 DIAGNOSIS — B349 Viral infection, unspecified: Secondary | ICD-10-CM

## 2017-01-22 MED ORDER — PANTOPRAZOLE SODIUM 40 MG PO TBEC: 40.0000 mg | DELAYED_RELEASE_TABLET | Freq: Every day | ORAL | 0 refills | Status: DC

## 2017-01-22 NOTE — Progress Notes (Signed)
Subjective:    Patient ID: Jason Farmer, male    DOB: 04-06-1935, 82 y.o.   MRN: [id number]  HPI  82 year old male who  has a past medical history of Anxiety; Arthritis; Chronic lower back pain; Essential tremor; GERD (gastroesophageal reflux disease); Glaucoma; Heart murmur; Hepatitis (~ 1958); HOH (hard of hearing); Hyperlipemia; Migraine; Seasonal allergies; TIA (transient ischemic attack) (04/2015); Wears glasses; and Wears hearing aid.  He presents to the clinic today with the complaint of fatigue, body aches, nausea, stomach pain, dizziness and the feeling of " whoozy". His symptoms are present every day and last an 1-3 hours and the symptoms usually resolve, except for the fatigue aspect which is present continuously. He continues to be able to function with his ADLs without taking a nap. His symptoms have been presents since February when he returned from [place]. In addition to these symptoms he also reports multiple joint pain that has been becoming progressively worse.   While in [place] he was around " dead sheep"   He does not remember any mosquito bites.   His daughter was also sick when they returned and was diagnosed with influenza A, she is back to feeling well at this time.   He denies any chest pain, SOB, fevers, or body rashes.     Review of Systems  Constitutional: Positive for activity change, appetite change and fatigue. Negative for chills, diaphoresis and fever.  HENT: Negative.   Eyes: Negative.   Respiratory: Negative.   Cardiovascular: Negative.   Gastrointestinal: Positive for abdominal pain and nausea. Negative for blood in stool, constipation, diarrhea and vomiting.  Genitourinary: Negative.   Musculoskeletal: Positive for arthralgias. Negative for myalgias.  Skin: Negative.   Neurological: Positive for weakness.  Hematological: Negative.   Psychiatric/Behavioral: Negative.    Past Medical History:  Diagnosis Date  . Anxiety    "not a problem for a  long time" (11/27/2016)  . Arthritis    "wrists, knees, shoulders, back" (11/27/2016)  . Chronic lower back pain    "5th disc is protruding" (11/27/2016)  . Essential tremor   . GERD (gastroesophageal reflux disease)   . Glaucoma   . Heart murmur    dx'd in [employer] in the 1960s; haven't been seen for it since "(11/27/2016)  . Hepatitis ~ 1958   "jaundice kind"  . HOH (hard of hearing)   . Hyperlipemia   . Migraine    "none since my neck OR" (11/27/2016)  . Seasonal allergies   . TIA (transient ischemic attack) 04/2015  . Wears glasses   . Wears hearing aid     Social History   Social History  . Marital status: Married    Spouse name: N/A  . Number of children: N/A  . Years of education: N/A   Occupational History  . Not on file.   Social History Main Topics  . Smoking status: Former Smoker    Packs/day: 2.00    Years: 30.00    Types: Cigarettes    Quit date: 06/03/1984  . Smokeless tobacco: Never Used  . Alcohol use No     Comment: 11/27/2016 "quit in 1985"  . Drug use: No  . Sexual activity: Not Currently   Other Topics Concern  . Not on file   Social History Narrative   Retired from [employer]   Married    4 children, one son lives locally    Past Surgical History:  Procedure Laterality Date  . ANTERIOR CERVICAL DECOMP/DISCECTOMY  FUSION  2007  . APPENDECTOMY    . BACK SURGERY    . CARPAL TUNNEL RELEASE Right 10/12/2013   Procedure: RIGHT CARPAL TUNNEL RELEASE;  Surgeon: Wynonia Sours, MD;  Location: [place];  Service: Orthopedics;  Laterality: Right;  . CATARACT EXTRACTION W/ INTRAOCULAR LENS  IMPLANT, BILATERAL Bilateral   . COLONOSCOPY    . HEMORRHOID SURGERY    . KNEE ARTHROSCOPY Left 1970s  . NASAL SEPTOPLASTY W/ TURBINOPLASTY Bilateral 02/15/2013   Procedure: NASAL SEPTOPLASTY WITH BILATER TURBINATE REDUCTION;  Surgeon: Ascencion Dike, MD;  Location: [place];  Service: ENT;  Laterality: Bilateral;  . TONSILLECTOMY    .  TRIGGER FINGER RELEASE  06/08/2012   Procedure: RELEASE TRIGGER FINGER/A-1 PULLEY;  Surgeon: Wynonia Sours, MD;  Location: [place];  Service: Orthopedics;  Laterality: Left;  Release A-1 Pulley Left Long Finger  . TRIGGER FINGER RELEASE  07/14/2012   Procedure: RELEASE TRIGGER FINGER/A-1 PULLEY;  Surgeon: Wynonia Sours, MD;  Location: [place];  Service: Orthopedics;  Laterality: Right;  . TRIGGER FINGER RELEASE Right 10/12/2013   Procedure: RELEASE TRIGGER FINGER/A-1 PULLEY RIGHT MIDDLE FINGER;  Surgeon: Wynonia Sours, MD;  Location: [place];  Service: Orthopedics;  Laterality: Right;  . UPPER GASTROINTESTINAL ENDOSCOPY      Family History  Problem Relation Age of Onset  . Dementia Father   . Heart failure Father   . Glaucoma Mother   . Colon cancer Neg Hx     Allergies  Allergen Reactions  . Statins Other (See Comments)    Muscle cramps    Current Outpatient Prescriptions on File Prior to Visit  Medication Sig Dispense Refill  . aspirin EC 81 MG tablet Take 81 mg by mouth daily.    Marland Kitchen atenolol (TENORMIN) 25 MG tablet TAKE 1 TABLET ONCE DAILY. 90 tablet 3  . latanoprost (XALATAN) 0.005 % ophthalmic solution Place 1 drop into both eyes at bedtime.     Marland Kitchen loratadine (CLARITIN) 10 MG tablet Take 1 tablet (10 mg total) by mouth daily. 90 tablet 3  . nitroGLYCERIN (NITROSTAT) 0.4 MG SL tablet Place 1 tablet (0.4 mg total) under the tongue every 5 (five) minutes as needed for chest pain. 25 tablet 2  . rosuvastatin (CRESTOR) 20 MG tablet Take 12.5 mg by mouth every other day.      No current facility-administered medications on file prior to visit.     BP 120/62 (BP Location: Left Arm, Patient Position: Sitting, Cuff Size: Normal)   Temp 97.8 F (36.6 C) (Oral)   Ht 5\' 9"  (1.753 m)   Wt 157 lb 6.4 oz (71.4 kg)   BMI 23.24 kg/m       Objective:   Physical Exam  Constitutional: He is oriented to person, place, and time. He appears  well-developed and well-nourished. No distress.  HENT:  Head: Normocephalic and atraumatic.  Right Ear: External ear normal.  Left Ear: External ear normal.  Nose: Nose normal.  Mouth/Throat: Oropharynx is clear and moist. No oropharyngeal exudate.  Eyes: Conjunctivae and EOM are normal. Pupils are equal, round, and reactive to light. Right eye exhibits no discharge. Left eye exhibits no discharge. No scleral icterus.  Neck: Normal range of motion. Neck supple. No thyromegaly present.  Cardiovascular: Normal rate, regular rhythm and intact distal pulses.  Exam reveals no gallop and no friction rub.   No murmur heard. Pulmonary/Chest: Effort normal and breath sounds normal.  No respiratory distress. He has no wheezes. He has no rales. He exhibits no tenderness.  Abdominal: Soft. Bowel sounds are normal. He exhibits no distension. There is tenderness in the periumbilical area. There is no rebound and no guarding.  Musculoskeletal: Normal range of motion. He exhibits no edema, tenderness or deformity.  Lymphadenopathy:    He has no cervical adenopathy.  Neurological: He is alert and oriented to person, place, and time. He displays normal reflexes. No cranial nerve deficit. He exhibits normal muscle tone.  Skin: Skin is warm and dry. No rash noted. He is not diaphoretic. No erythema. No pallor.  Psychiatric: He has a normal mood and affect. His behavior is normal. Judgment and thought content normal.  Nursing note and vitals reviewed.     Assessment & Plan:  1. Viral illness - Unknown cause. Could be mosquito borne illness?  - Chikungunya Abs w/Reflex to [place]; Future - Basic Metabolic Panel; Future - ANA; Future - CBC with Differential/Platelet; Future - Hepatic function panel; Future - Ambulatory referral to Infectious Disease  Dorothyann Peng, NP

## 2017-02-04 ENCOUNTER — Other Ambulatory Visit (INDEPENDENT_AMBULATORY_CARE_PROVIDER_SITE_OTHER): Payer: Medicare Other

## 2017-02-04 DIAGNOSIS — B349 Viral infection, unspecified: Secondary | ICD-10-CM

## 2017-02-04 LAB — CBC WITH DIFFERENTIAL/PLATELET
Basophils Absolute: 0 10*3/uL (ref 0.0–0.1)
Basophils Relative: 0.4 % (ref 0.0–3.0)
Eosinophils Absolute: 0.1 10*3/uL (ref 0.0–0.7)
Eosinophils Relative: 1.6 % (ref 0.0–5.0)
HCT: 44.7 % (ref 39.0–52.0)
Hemoglobin: 15.1 g/dL (ref 13.0–17.0)
Lymphocytes Relative: 32 % (ref 12.0–46.0)
Lymphs Abs: 2 10*3/uL (ref 0.7–4.0)
MCHC: 33.8 g/dL (ref 30.0–36.0)
MCV: 90.3 fl (ref 78.0–100.0)
Monocytes Absolute: 0.4 10*3/uL (ref 0.1–1.0)
Monocytes Relative: 5.8 % (ref 3.0–12.0)
Neutro Abs: 3.8 10*3/uL (ref 1.4–7.7)
Neutrophils Relative %: 60.2 % (ref 43.0–77.0)
Platelets: 290 10*3/uL (ref 150.0–400.0)
RBC: 4.95 Mil/uL (ref 4.22–5.81)
RDW: 13.3 % (ref 11.5–15.5)
WBC: 6.3 10*3/uL (ref 4.0–10.5)

## 2017-02-04 LAB — BASIC METABOLIC PANEL
BUN: 20 mg/dL (ref 6–23)
CO2: 28 mEq/L (ref 19–32)
Calcium: 9.5 mg/dL (ref 8.4–10.5)
Chloride: 105 mEq/L (ref 96–112)
Creatinine, Ser: 1.26 mg/dL (ref 0.40–1.50)
GFR: 58.56 mL/min — ABNORMAL LOW (ref >60.00)
Glucose, Bld: 93 mg/dL (ref 70–99)
Potassium: 4.4 mEq/L (ref 3.5–5.1)
Sodium: 138 mEq/L (ref 135–145)

## 2017-02-04 LAB — HEPATIC FUNCTION PANEL
ALT: 10 U/L (ref 0–53)
AST: 12 U/L (ref 0–37)
Albumin: 4.1 g/dL (ref 3.5–5.2)
Alkaline Phosphatase: 51 U/L (ref 39–117)
Bilirubin, Direct: 0.1 mg/dL (ref 0.0–0.3)
Total Bilirubin: 0.5 mg/dL (ref 0.2–1.2)
Total Protein: 6.4 g/dL (ref 6.0–8.3)

## 2017-02-05 LAB — ANA: Anti Nuclear Antibody(ANA): NEGATIVE

## 2017-02-09 LAB — CHIKUNGUNYA ABS W/REFLEX TO TITER
Chikungunya IgG Screen: NEGATIVE
Chikungunya IgM Screen: NEGATIVE

## 2017-03-04 NOTE — Progress Notes (Signed)
Cardiology Office Note   Date:  03/05/2017   ID:  Jason Farmer, DOB Jun 14, 1935, MRN [id number]  PCP:  Dorothyann Peng, NP  Cardiologist:  Dr. Johnsie Cancel    Chief Complaint  Patient presents with  . Chest Pain    follow up      History of Present Illness: Jason Farmer is a 82 y.o. male who presents for hx of chest pain  Pt was hosptialized April this year with chest pain with Pk troponin of 0.14 and Nuc neg.  With EF 64%.  CTA was neg for aortic dissection, CT images done to r/o PE showed ostial and proximal LAD calcium.   Saw Dr. Johnsie Cancel post hospital with plan if pain returned then cardiac CTA with FFR or cardiac cath.  He is on BB, and ASA and PRN ntg.  Other hx on TIA, HLD on statin, GERD, mild carotid disease and HTN.   Today he has been to [place] and enjoyed himself. He and wife who is from [place] went to a wedding.  He travels extensively.    He has no further chest pain.  No SOB.  He has chronic dizziness at times.  Is on Claritin and has seen allergist and Neuro.    He is on low dose of crestor 10 mg every other day - he has problems with statins, Lipitor as well causing leg cramps, they are miserable for him.  Not only in calf but thighs.  Last LDL in Jan was 159 on current dose of crestor.    He now hs Dx of CAD with ca+ in LAD on CTA so he may be candidate for Repatha.  He had thought about it before but could not afford.      Past Medical History:  Diagnosis Date  . Anxiety    "not a problem for a long time" (11/27/2016)  . Arthritis    "wrists, knees, shoulders, back" (11/27/2016)  . Chronic lower back pain    "5th disc is protruding" (11/27/2016)  . Essential tremor   . GERD (gastroesophageal reflux disease)   . Glaucoma   . Heart murmur    dx'd in [employer] in the 1960s; haven't been seen for it since "(11/27/2016)  . Hepatitis ~ 1958   "jaundice kind"  . HOH (hard of hearing)   . Hyperlipemia   . Migraine    "none since my neck OR" (11/27/2016)  . Seasonal  allergies   . TIA (transient ischemic attack) 04/2015  . Wears glasses   . Wears hearing aid     Past Surgical History:  Procedure Laterality Date  . ANTERIOR CERVICAL DECOMP/DISCECTOMY FUSION  2007  . APPENDECTOMY    . BACK SURGERY    . CARPAL TUNNEL RELEASE Right 10/12/2013   Procedure: RIGHT CARPAL TUNNEL RELEASE;  Surgeon: Wynonia Sours, MD;  Location: [facility];  Service: Orthopedics;  Laterality: Right;  . CATARACT EXTRACTION W/ INTRAOCULAR LENS  IMPLANT, BILATERAL Bilateral   . COLONOSCOPY    . HEMORRHOID SURGERY    . KNEE ARTHROSCOPY Left 1970s  . NASAL SEPTOPLASTY W/ TURBINOPLASTY Bilateral 02/15/2013   Procedure: NASAL SEPTOPLASTY WITH BILATER TURBINATE REDUCTION;  Surgeon: Ascencion Dike, MD;  Location: [facility];  Service: ENT;  Laterality: Bilateral;  . TONSILLECTOMY    . TRIGGER FINGER RELEASE  06/08/2012   Procedure: RELEASE TRIGGER FINGER/A-1 PULLEY;  Surgeon: Wynonia Sours, MD;  Location: [facility];  Service: Orthopedics;  Laterality:  Left;  Release A-1 Pulley Left Long Finger  . TRIGGER FINGER RELEASE  07/14/2012   Procedure: RELEASE TRIGGER FINGER/A-1 PULLEY;  Surgeon: Wynonia Sours, MD;  Location: [facility];  Service: Orthopedics;  Laterality: Right;  . TRIGGER FINGER RELEASE Right 10/12/2013   Procedure: RELEASE TRIGGER FINGER/A-1 PULLEY RIGHT MIDDLE FINGER;  Surgeon: Wynonia Sours, MD;  Location: [facility];  Service: Orthopedics;  Laterality: Right;  . UPPER GASTROINTESTINAL ENDOSCOPY       Current Outpatient Prescriptions  Medication Sig Dispense Refill  . aspirin EC 81 MG tablet Take 81 mg by mouth daily.    Marland Kitchen atenolol (TENORMIN) 25 MG tablet TAKE 1 TABLET ONCE DAILY. 90 tablet 3  . latanoprost (XALATAN) 0.005 % ophthalmic solution Place 1 drop into both eyes at bedtime.     Marland Kitchen loratadine (CLARITIN) 10 MG tablet Take 1 tablet (10 mg total) by mouth daily. 90 tablet 3  . nitroGLYCERIN  (NITROSTAT) 0.4 MG SL tablet Place 1 tablet (0.4 mg total) under the tongue every 5 (five) minutes as needed for chest pain. 25 tablet 2  . pantoprazole (PROTONIX) 40 MG tablet Take 1 tablet (40 mg total) by mouth daily. 21 tablet 0  . rosuvastatin (CRESTOR) 20 MG tablet Take 10 mg by mouth every other day.      No current facility-administered medications for this visit.     Allergies:   Statins    Social History:  The patient  reports that he quit smoking about 32 years ago. His smoking use included Cigarettes. He has a 60.00 pack-year smoking history. He has never used smokeless tobacco. He reports that he does not drink alcohol or use drugs.   Family History:  The patient's family history includes Dementia in his father; Glaucoma in his mother; Heart failure in his father.    ROS:  General:no colds or fevers, + weight decrease 5 lbs since April. Skin:no rashes or ulcers HEENT:no blurred vision, no congestion CV:see HPI PUL:see HPI GI:no diarrhea constipation or melena, no indigestion GU:no hematuria, no dysuria MS:no joint pain, no claudication Neuro:no syncope, no lightheadedness Endo:no diabetes, no thyroid disease  Wt Readings from Last 3 Encounters:  03/05/17 155 lb 12.8 oz (70.7 kg)  01/22/17 157 lb 6.4 oz (71.4 kg)  12/08/16 160 lb (72.6 kg)     PHYSICAL EXAM: VS:  BP 132/80   Pulse 72   Ht 5\' 9"  (1.753 m)   Wt 155 lb 12.8 oz (70.7 kg)   BMI 23.01 kg/m  , BMI Body mass index is 23.01 kg/m. General:Pleasant affect, NAD Skin:Warm and dry, brisk capillary refill HEENT:normocephalic, sclera clear, mucus membranes moist Neck:supple, no JVD, no bruits  Heart:S1S2 RRR without murmur, gallup, rub or click Lungs:clear without rales, rhonchi, or wheezes WER:XVQM, non tender, + BS, do not palpate liver spleen or masses Ext:no lower ext edema, 2+ pedal pulses, 2+ radial pulses Neuro:alert and oriented X 3, MAE, follows commands, + facial symmetry    EKG:  EKG is NOT  ordered today.   Recent Labs: 11/10/2016: TSH 0.95 02/04/2017: ALT 10; BUN 20; Creatinine, Ser 1.26; Hemoglobin 15.1; Platelets 290.0; Potassium 4.4; Sodium 138    Lipid Panel    Component Value Date/Time   CHOL 235 (H) 09/16/2016 0848   TRIG 122.0 09/16/2016 0848   HDL 51.70 09/16/2016 0848   CHOLHDL 5 09/16/2016 0848   VLDL 24.4 09/16/2016 0848   LDLCALC 159 (H) 09/16/2016 0848   LDLDIRECT 139.5 09/01/2013 0867  Other studies Reviewed: Additional studies/ records that were reviewed today include: .  Echo 08/29/10 Study Conclusions  - Left ventricle: The cavity size was normal. Wall thickness was normal. Systolic function was normal. The estimated ejection fraction was 55%, in the range of 55% to 60%. Wall motion was normal; there were no regional wall motion abnormalities. - Left atrium: The atrium was mildly dilated.  CTA of chest 11/2016 Cardiovascular: No evidence of an aortic dissection or aneurysm. Minor atherosclerotic plaque noted along the arch and descending thoracic aorta. Aortic branch vessels are widely patent.  Heart is top-normal in size. There are moderate left coronary artery calcifications  IMPRESSION: 1. No aortic dissection or aneurysm. 2. No acute findings in the chest, abdomen or pelvis. 3. There is aortic atherosclerosis, with no significant stenosis. Aortic branch vessels are widely patent. 4. Peripheral interstitial thickening consistent with fibrosis as well as changes of paraseptal emphysema.   Nuc 11/28/16 Addendum     There was no ST segment deviation noted during stress.  This is a low risk study.  Nuclear stress EF: 64%.  No T wave inversion was noted during stress.  TID 1.32   No reversible ischemia. LVEF 64% with normal wall motion.  Abnormal TID ratio of 1.32. This is a low risk study.    Carotid Dopplers 2016 IMPRESSION: 1. Mild (1-49%) stenosis proximal right internal carotid artery secondary to  mild smooth heterogeneous atherosclerotic plaque. 2. Mild (1-49%) stenosis proximal left internal carotid artery secondary to mild smooth heterogeneous atherosclerotic plaque. 3. Vertebral arteries remain patent with normal antegrade flow.   ASSESSMENT AND PLAN:  1.  CAD with calcification on CTA of chest .  No angina has been active without any pain or SOB.  Per Dr. Kyla Balzarine note he pain reoccurs proceed with cath or Cardiac CTA with FFR.  Pt understands to call if pain occurs.  Continue ASA and BB- did have admit for chest pain with elevated troponin but neg. Nuc.  No problems since that admit.  2. HLD LDL of 159, now with DX of CAD insurance may pay for Repatha pt agreeable to go to lipid clinic.  He also receives meds from [facility].  Continue crestor for now.   3.    HTN well controlled  4.   Hx TIA  5.  PAD with < 50 % carotid stenosis bil.         Current medicines are reviewed with the patient today.  The patient Has no concerns regarding medicines.  The following changes have been made:  See above Labs/ tests ordered today include:see above  Disposition:   FU:  see above  Signed, Cecilie Kicks, NP  03/05/2017 11:24 AM    [address], [place], [place], [place] Phone: [phone]; Fax: [phone]

## 2017-03-05 ENCOUNTER — Ambulatory Visit (INDEPENDENT_AMBULATORY_CARE_PROVIDER_SITE_OTHER): Payer: Medicare Other | Admitting: Cardiology

## 2017-03-05 ENCOUNTER — Encounter: Payer: Self-pay | Admitting: Cardiology

## 2017-03-05 ENCOUNTER — Ambulatory Visit: Payer: Medicare Other | Admitting: Cardiovascular Disease

## 2017-03-05 VITALS — BP 132/80 | HR 72 | Ht 69.0 in | Wt 155.8 lb

## 2017-03-05 DIAGNOSIS — I10 Essential (primary) hypertension: Secondary | ICD-10-CM

## 2017-03-05 DIAGNOSIS — R079 Chest pain, unspecified: Secondary | ICD-10-CM | POA: Diagnosis not present

## 2017-03-05 DIAGNOSIS — E782 Mixed hyperlipidemia: Secondary | ICD-10-CM

## 2017-03-05 DIAGNOSIS — I779 Disorder of arteries and arterioles, unspecified: Secondary | ICD-10-CM | POA: Diagnosis not present

## 2017-03-05 DIAGNOSIS — I2510 Atherosclerotic heart disease of native coronary artery without angina pectoris: Secondary | ICD-10-CM

## 2017-03-05 DIAGNOSIS — I739 Peripheral vascular disease, unspecified: Secondary | ICD-10-CM

## 2017-03-05 NOTE — Patient Instructions (Signed)
Medication Instructions:   Your physician recommends that you continue on your current medications as directed. Please refer to the Current Medication list given to you today.   If you need a refill on your cardiac medications before your next appointment, please call your pharmacy.  Labwork: NONE ORDERED  TODAY    Testing/Procedures: NONE ORDERED  TODAY    Follow-Up: WITH LIPID CLINIC IN 2 TO 3 WEEKS   WITH DR Johnsie Cancel 4 MONTHS    Any Other Special Instructions Will Be Listed Below (If Applicable).

## 2017-03-18 DIAGNOSIS — M180 Bilateral primary osteoarthritis of first carpometacarpal joints: Secondary | ICD-10-CM | POA: Diagnosis not present

## 2017-03-19 ENCOUNTER — Ambulatory Visit (INDEPENDENT_AMBULATORY_CARE_PROVIDER_SITE_OTHER): Payer: Medicare Other | Admitting: Pharmacist

## 2017-03-19 ENCOUNTER — Encounter: Payer: Self-pay | Admitting: Pharmacist

## 2017-03-19 DIAGNOSIS — E785 Hyperlipidemia, unspecified: Secondary | ICD-10-CM

## 2017-03-19 DIAGNOSIS — I2510 Atherosclerotic heart disease of native coronary artery without angina pectoris: Secondary | ICD-10-CM

## 2017-03-19 NOTE — Patient Instructions (Addendum)
Patient is now statin intolerant after having severe muscle aching in his thighs that limit his ability to walk on Lipitor, Zocor and Crestor (most recently 10mg  every other day). In addition the Crestor every other day did not get his LDL to goal of less than 70 (most recent panel below). At this time there is no evidence for using Zetia monotherapy this would also likely not get his LDL to goal. We feel from a cardiovascular risk treatment with PCSK9i (Repatha or Praluent) therapy would be the best option.   09/16/16: TC 235, TG 122, HDL 51, LDL 159 - crestor 10mg  every other day  Risk Factors: CAD with ca+ in LAD on CTA, TIA  Please call [phone] with any questions or concerns.   Lelan Pons. Patterson Hammersmith, PharmD, BCPS, [place]  [address] [place], [place] [address]  Phone: [phone]; Fax: [phone]

## 2017-03-19 NOTE — Progress Notes (Signed)
Patient ID: [name]                 DOB: 1934/10/18                    MRN: [id number]     HPI: Jason Farmer is a 82 y.o. male patient of Dr. Johnsie Cancel that presents today for lipid evaluation.  PMH includes TIA, HLD on statin, GERD, mild carotid disease and HTN. He has been on rosuvastatin 10mg  every other day and most recently his LDL was not at goal. He has VA coverage for prescriptions.   Today he presents for discussion of PCSK9i therapy. He reports that he is having severe muscle aching that affects his walking on his current dose of rosuvastatin.   After discussion of PCSK9i therapy he states he is not ultimately that interested in an injection. He also is concerned with cost to the government and environmental influence of so many syringes. He gets his medications at the [facility] in [place] but may be transferring to [place] in the near future. He requests that we send a letter with him with our recommendation and he will defer to his provider at the [facility] for initiation of PCSK9i therapy because he will not be able to afford if not through [facility].    Risk Factors: CAD with ca+ in LAD on CTA, TIA LDL Goal: <70  Current Medications: rosuvasatin 10mg  every other day Intolerances: Zocor and Lipitor caused leg cramping improved when stopped medication  Diet: Breakfast eggs and bacon (3) with toast and jelly; Lunch sandwich pastrami and cheese usually; Dinner mostly white meats with occasional red meat. He does eat most of his meats fried. Drinks diet pepsi mostly and water. Has a pack of Nabs per day and chocolates. Ice cream for dessert.   Exercise: Still active with golf and fishing. Stays busy.   Family History: Dementia in his father; Glaucoma in his mother; Heart failure in his father.   Social History: He quit smoking about 32 years ago. His smoking use included Cigarettes. He has a 60.00 pack-year smoking history. He has never used smokeless tobacco. He reports that he does  not drink alcohol or use drugs.   Labs: 09/16/16: TC 235, TG 122, HDL 51, LDL 159 - crestor 10mg  every other day   Past Medical History:  Diagnosis Date  . Anxiety    "not a problem for a long time" (11/27/2016)  . Arthritis    "wrists, knees, shoulders, back" (11/27/2016)  . Chronic lower back pain    "5th disc is protruding" (11/27/2016)  . Essential tremor   . GERD (gastroesophageal reflux disease)   . Glaucoma   . Heart murmur    dx'd in [employer] in the 1960s; haven't been seen for it since "(11/27/2016)  . Hepatitis ~ 1958   "jaundice kind"  . HOH (hard of hearing)   . Hyperlipemia   . Migraine    "none since my neck OR" (11/27/2016)  . Seasonal allergies   . TIA (transient ischemic attack) 04/2015  . Wears glasses   . Wears hearing aid     Current Outpatient Prescriptions on File Prior to Visit  Medication Sig Dispense Refill  . aspirin EC 81 MG tablet Take 81 mg by mouth daily.    Marland Kitchen atenolol (TENORMIN) 25 MG tablet TAKE 1 TABLET ONCE DAILY. 90 tablet 3  . latanoprost (XALATAN) 0.005 % ophthalmic solution Place 1 drop into both eyes at bedtime.     Marland Kitchen  loratadine (CLARITIN) 10 MG tablet Take 1 tablet (10 mg total) by mouth daily. 90 tablet 3  . nitroGLYCERIN (NITROSTAT) 0.4 MG SL tablet Place 1 tablet (0.4 mg total) under the tongue every 5 (five) minutes as needed for chest pain. 25 tablet 2  . pantoprazole (PROTONIX) 40 MG tablet Take 1 tablet (40 mg total) by mouth daily. 21 tablet 0  . rosuvastatin (CRESTOR) 20 MG tablet Take 10 mg by mouth every other day.      No current facility-administered medications on file prior to visit.     Allergies  Allergen Reactions  . Statins Other (See Comments)    Muscle cramps    Assessment/Plan: Hyperlipidemia: LDL not at goal on most recent panel. He will continue his current dose of rosuvastatin despite symptoms. Provided him our recommendation for PCSK9i therapy as requested. He will take this to [facility] provider as he wishes to follow  their recommendation. He will call with any questions or concerns.    Thank you,  Lelan Pons. Patterson Hammersmith, [facility]  03/19/2017 9:04 AM

## 2017-05-05 ENCOUNTER — Telehealth: Payer: Self-pay | Admitting: Family Medicine

## 2017-05-15 ENCOUNTER — Encounter: Payer: Self-pay | Admitting: Adult Health

## 2017-05-20 ENCOUNTER — Ambulatory Visit (INDEPENDENT_AMBULATORY_CARE_PROVIDER_SITE_OTHER): Payer: Medicare Other | Admitting: *Deleted

## 2017-05-20 DIAGNOSIS — Z23 Encounter for immunization: Secondary | ICD-10-CM | POA: Diagnosis not present

## 2017-06-19 DIAGNOSIS — M75111 Incomplete rotator cuff tear or rupture of right shoulder, not specified as traumatic: Secondary | ICD-10-CM | POA: Diagnosis not present

## 2017-06-19 DIAGNOSIS — M7581 Other shoulder lesions, right shoulder: Secondary | ICD-10-CM | POA: Diagnosis not present

## 2017-06-29 DIAGNOSIS — M19012 Primary osteoarthritis, left shoulder: Secondary | ICD-10-CM | POA: Diagnosis not present

## 2017-06-29 DIAGNOSIS — M75101 Unspecified rotator cuff tear or rupture of right shoulder, not specified as traumatic: Secondary | ICD-10-CM | POA: Diagnosis not present

## 2017-06-29 DIAGNOSIS — M7541 Impingement syndrome of right shoulder: Secondary | ICD-10-CM | POA: Diagnosis not present

## 2017-06-29 DIAGNOSIS — M7542 Impingement syndrome of left shoulder: Secondary | ICD-10-CM | POA: Diagnosis not present

## 2017-07-07 DIAGNOSIS — M7542 Impingement syndrome of left shoulder: Secondary | ICD-10-CM | POA: Diagnosis not present

## 2017-07-07 NOTE — Progress Notes (Signed)
Cardiology Office Note   Date:  07/13/2017   ID:  Jason Farmer, DOB 27-Oct-1934, MRN [id number]  PCP:  Dorothyann Peng, NP  Cardiologist:  Dr. Johnsie Cancel    No chief complaint on file.     History of Present Illness: Jason Farmer is a 82 y.o. male who presents for hx of chest pain  Pt was hosptialized April this year with chest pain with Pk troponin of 0.14 and Nuc neg.  With EF 64%.  CTA was neg for aortic dissection, CT images done to r/o PE showed ostial and proximal LAD calcium.  He is on BB, and ASA and PRN ntg.  Other hx on TIA, HLD on statin, GERD, mild carotid disease and HTN.   Seen by PA 03/05/17  been to [place] and enjoyed himself. He and wife who is from [place] went to a wedding.  He travels extensively.    He has no further chest pain.  No SOB.  He has chronic dizziness at times.  Is on Claritin and has seen allergist and Neuro.    He is on low dose of crestor 10 mg every other day - he has problems with statins, Lipitor as well causing leg cramps, they are miserable for him.  Not only in calf but thighs.  Last LDL in Jan was 159 on current dose of crestor.    He now hs Dx of CAD with ca+ in LAD on CTA so he may be candidate for Repatha.  He had thought about it before but could not afford.     Talked about dogs a lot He has Min Pin's    Past Medical History:  Diagnosis Date  . Anxiety    "not a problem for a long time" (11/27/2016)  . Arthritis    "wrists, knees, shoulders, back" (11/27/2016)  . Chronic lower back pain    "5th disc is protruding" (11/27/2016)  . Essential tremor   . GERD (gastroesophageal reflux disease)   . Glaucoma   . Heart murmur    dx'd in [employer] in the 1960s; haven't been seen for it since "(11/27/2016)  . Hepatitis ~ 1958   "jaundice kind"  . HOH (hard of hearing)   . Hyperlipemia   . Migraine    "none since my neck OR" (11/27/2016)  . Seasonal allergies   . TIA (transient ischemic attack) 04/2015  . Wears glasses   . Wears hearing aid       Past Surgical History:  Procedure Laterality Date  . ANTERIOR CERVICAL DECOMP/DISCECTOMY FUSION  2007  . APPENDECTOMY    . BACK SURGERY    . CATARACT EXTRACTION W/ INTRAOCULAR LENS  IMPLANT, BILATERAL Bilateral   . COLONOSCOPY    . HEMORRHOID SURGERY    . KNEE ARTHROSCOPY Left 1970s  . NASAL SEPTOPLASTY WITH BILATER TURBINATE REDUCTION Bilateral 02/15/2013   Performed by Ascencion Dike, MD at [facility]  . RELEASE TRIGGER FINGER/A-1 PULLEY Right 07/14/2012   Performed by Wynonia Sours, MD at [facility]  . RELEASE TRIGGER FINGER/A-1 PULLEY Left 06/08/2012   Performed by Wynonia Sours, MD at [facility]  . RELEASE TRIGGER FINGER/A-1 PULLEY RIGHT MIDDLE FINGER Right 10/12/2013   Performed by Daryll Brod, MD at [facility]  . RIGHT CARPAL TUNNEL RELEASE Right 10/12/2013   Performed by Daryll Brod, MD at [facility]  . TONSILLECTOMY    . UPPER GASTROINTESTINAL ENDOSCOPY  Current Outpatient Medications  Medication Sig Dispense Refill  . aspirin EC 81 MG tablet Take 81 mg by mouth daily.    Marland Kitchen atenolol (TENORMIN) 25 MG tablet TAKE 1 TABLET ONCE DAILY. (Patient taking differently: TAKE 1/2 TABLET ONCE DAILY.) 90 tablet 3  . latanoprost (XALATAN) 0.005 % ophthalmic solution Place 1 drop into both eyes at bedtime.     Marland Kitchen loratadine (CLARITIN) 10 MG tablet Take 1 tablet (10 mg total) by mouth daily. 90 tablet 3  . omeprazole (PRILOSEC) 20 MG capsule Take 20 mg by mouth daily.    . nitroGLYCERIN (NITROSTAT) 0.4 MG SL tablet Place 1 tablet (0.4 mg total) under the tongue every 5 (five) minutes as needed for chest pain. 25 tablet 2   No current facility-administered medications for this visit.     Allergies:   Statins    Social History:  The patient  reports that he quit smoking about 33 years ago. His smoking use included cigarettes. He has a 60.00 pack-year smoking history. he has never used smokeless tobacco. He  reports that he does not drink alcohol or use drugs.   Family History:  The patient's family history includes Dementia in his father; Glaucoma in his mother; Heart failure in his father.    ROS:  General:no colds or fevers, + weight decrease 5 lbs since April. Skin:no rashes or ulcers HEENT:no blurred vision, no congestion CV:see HPI PUL:see HPI GI:no diarrhea constipation or melena, no indigestion GU:no hematuria, no dysuria MS:no joint pain, no claudication Neuro:no syncope, no lightheadedness Endo:no diabetes, no thyroid disease  Wt Readings from Last 3 Encounters:  07/13/17 158 lb 8 oz (71.9 kg)  03/05/17 155 lb 12.8 oz (70.7 kg)  01/22/17 157 lb 6.4 oz (71.4 kg)     PHYSICAL EXAM: VS:  BP (!) 150/74 Comment: TRAFFIC COMING HERE TODAY  Pulse 68   Ht 5\' 9"  (1.753 m)   Wt 158 lb 8 oz (71.9 kg)   SpO2 97%   BMI 23.41 kg/m  , BMI Body mass index is 23.41 kg/m.  Affect appropriate Healthy:  appears stated age 33: normal Neck supple with no adenopathy JVP normal no bruits no thyromegaly Lungs clear with no wheezing and good diaphragmatic motion Heart:  S1/S2 no murmur, no rub, gallop or click PMI normal Abdomen: benighn, BS positve, no tenderness, no AAA no bruit.  No HSM or HJR Distal pulses intact with no bruits No edema Neuro non-focal Skin warm and dry No muscular weakness    EKG:   11/28/16 SR normal    Recent Labs: 11/10/2016: TSH 0.95 02/04/2017: ALT 10; BUN 20; Creatinine, Ser 1.26; Hemoglobin 15.1; Platelets 290.0; Potassium 4.4; Sodium 138    Lipid Panel    Component Value Date/Time   CHOL 235 (H) 09/16/2016 0848   TRIG 122.0 09/16/2016 0848   HDL 51.70 09/16/2016 0848   CHOLHDL 5 09/16/2016 0848   VLDL 24.4 09/16/2016 0848   LDLCALC 159 (H) 09/16/2016 0848   LDLDIRECT 139.5 09/01/2013 3536       Other studies Reviewed: Additional studies/ records that were reviewed today include: .  Echo 08/29/10 Study Conclusions  - Left ventricle:  The cavity size was normal. Wall thickness was normal. Systolic function was normal. The estimated ejection fraction was 55%, in the range of 55% to 60%. Wall motion was normal; there were no regional wall motion abnormalities. - Left atrium: The atrium was mildly dilated.  CTA of chest 11/2016 Cardiovascular: No evidence of an aortic  dissection or aneurysm. Minor atherosclerotic plaque noted along the arch and descending thoracic aorta. Aortic branch vessels are widely patent.  Heart is top-normal in size. There are moderate left coronary artery calcifications  IMPRESSION: 1. No aortic dissection or aneurysm. 2. No acute findings in the chest, abdomen or pelvis. 3. There is aortic atherosclerosis, with no significant stenosis. Aortic branch vessels are widely patent. 4. Peripheral interstitial thickening consistent with fibrosis as well as changes of paraseptal emphysema.   Nuc 11/28/16 Addendum     There was no ST segment deviation noted during stress.  This is a low risk study.  Nuclear stress EF: 64%.  No T wave inversion was noted during stress.  TID 1.32   No reversible ischemia. LVEF 64% with normal wall motion.  Abnormal TID ratio of 1.32. This is a low risk study.    Carotid Dopplers 2016 IMPRESSION: 1. Mild (1-49%) stenosis proximal right internal carotid artery secondary to mild smooth heterogeneous atherosclerotic plaque. 2. Mild (1-49%) stenosis proximal left internal carotid artery secondary to mild smooth heterogeneous atherosclerotic plaque. 3. Vertebral arteries remain patent with normal antegrade flow.   ASSESSMENT AND PLAN:  1.  CAD with calcification on CTA of chest .  No angina has been active without any pain or SOB. Continue ASA and BB- did have admit for chest pain with elevated troponin but neg. Nuc.  No problems since that admit. Normal myovue 12/08/16   2. HLD LDL of 159, now with DX of CAD Seen by lipid clinic 03/19/17 and  given infor on PSK9 Rx not feasible  3.  HTN well controlled  4.   Hx TIA plaque no stenosis on carotids 2016 will update    Jenkins Rouge

## 2017-07-13 ENCOUNTER — Encounter: Payer: Self-pay | Admitting: Cardiovascular Disease

## 2017-07-13 ENCOUNTER — Ambulatory Visit (INDEPENDENT_AMBULATORY_CARE_PROVIDER_SITE_OTHER): Payer: Medicare Other | Admitting: Cardiovascular Disease

## 2017-07-13 ENCOUNTER — Telehealth: Payer: Self-pay | Admitting: Family Medicine

## 2017-07-13 VITALS — BP 150/74 | HR 68 | Ht 69.0 in | Wt 158.5 lb

## 2017-07-13 DIAGNOSIS — I2510 Atherosclerotic heart disease of native coronary artery without angina pectoris: Secondary | ICD-10-CM

## 2017-07-13 NOTE — Telephone Encounter (Signed)
Copied from [place] [id number]. Topic: General - Other >> Jul 13, 2017  3:05 PM Carolyn Stare wrote: Reason for CRM:  pt would like a call back to discuss the medicines that he is taking    [phone]  Duplicate, done

## 2017-07-13 NOTE — Patient Instructions (Addendum)

## 2017-07-14 MED ORDER — OMEPRAZOLE 20 MG PO CPDR: 20.0000 mg | DELAYED_RELEASE_CAPSULE | Freq: Every day | ORAL | 0 refills | Status: DC

## 2017-07-14 NOTE — Telephone Encounter (Signed)
Spoke to the pt.  He wanted to know why his omeprazole is now 40 mg.  Has been twenty mg in the past.  Looks like the increase was on 05/05/17 on refill request but there is no documentation.  Will forward to [facility] to see if he is aware of increase.

## 2017-07-14 NOTE — Addendum Note (Signed)
Addended by: Miles Costain T on: 07/14/2017 04:21 PM   Modules accepted: Orders

## 2017-07-14 NOTE — Telephone Encounter (Signed)
I cannot tell why it was increased. He can take 20 mg.

## 2017-07-14 NOTE — Telephone Encounter (Signed)
Spoke to the pt.  Informed him that he should be taking 20 mg.  Sent to the pharmacy with a message attached that pt will call when he needs a refill.

## 2017-09-01 DIAGNOSIS — H43813 Vitreous degeneration, bilateral: Secondary | ICD-10-CM | POA: Diagnosis not present

## 2017-09-01 DIAGNOSIS — G43109 Migraine with aura, not intractable, without status migrainosus: Secondary | ICD-10-CM | POA: Diagnosis not present

## 2017-10-27 ENCOUNTER — Telehealth: Payer: Self-pay | Admitting: Adult Health

## 2017-10-27 NOTE — Telephone Encounter (Signed)
Jason Farmer and I destroyed the Rx for rosuvastatin 20 MG that was written 09/28/15.

## 2017-10-28 DIAGNOSIS — M7541 Impingement syndrome of right shoulder: Secondary | ICD-10-CM | POA: Insufficient documentation

## 2017-10-28 DIAGNOSIS — M7542 Impingement syndrome of left shoulder: Secondary | ICD-10-CM | POA: Diagnosis not present

## 2017-11-27 DIAGNOSIS — H401131 Primary open-angle glaucoma, bilateral, mild stage: Secondary | ICD-10-CM | POA: Diagnosis not present

## 2017-12-21 ENCOUNTER — Telehealth: Payer: Self-pay | Admitting: Family Medicine

## 2017-12-21 NOTE — Telephone Encounter (Signed)
Copied from [place] [id number]. Topic: Appointment Scheduling - Scheduling Inquiry for Clinic >> Dec 21, 2017 10:06 AM Margot Ables wrote: Reason for CRM: Pt is wanting to schedule AWV. He states that he has been called a couple times. Try both phone #s [phone] (home) OR if no answer [phone] (cell)
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
## 2017-12-23 ENCOUNTER — Other Ambulatory Visit: Payer: Self-pay | Admitting: Adult Health

## 2017-12-23 ENCOUNTER — Ambulatory Visit (INDEPENDENT_AMBULATORY_CARE_PROVIDER_SITE_OTHER): Payer: Medicare Other

## 2017-12-23 VITALS — BP 128/60 | HR 70 | Ht 69.0 in | Wt 156.5 lb

## 2017-12-23 DIAGNOSIS — Z0000 Encounter for general adult medical examination without abnormal findings: Secondary | ICD-10-CM | POA: Diagnosis not present

## 2017-12-23 MED ORDER — OMEPRAZOLE 20 MG PO CPDR: 20.0000 mg | DELAYED_RELEASE_CAPSULE | Freq: Every day | ORAL | 0 refills | Status: DC

## 2017-12-23 MED ORDER — ROSUVASTATIN CALCIUM 20 MG PO TABS: 20.0000 mg | ORAL_TABLET | Freq: Every day | ORAL | 0 refills | Status: DC

## 2017-12-23 MED ORDER — ATENOLOL 25 MG PO TABS
12.5000 mg | ORAL_TABLET | Freq: Every day | ORAL | 0 refills | Status: DC
Start: 2017-12-23 — End: 2018-06-10

## 2017-12-23 NOTE — Patient Instructions (Addendum)
[name] , Thank you for taking time to come for your Medicare Wellness Visit. I appreciate your ongoing commitment to your health goals. Please review the following plan we discussed and let me know if I can assist you in the future.   Bring Jason Farmer a copy of your IMM records and your most current labs.   [name] to check with [facility] regarding refills  A Tetanus is recommended every 10 years. Medicare covers a tetanus if you have a cut or wound; otherwise, there may be a charge. If you had not had a tetanus with pertusses, known as the Tdap, you can take this anytime.   Shingrix is a vaccine for the prevention of Shingles in Adults 50 and older.  If you are on Medicare, the shingrix is covered under your Part D plan, so you will take both of the vaccines in the series at your pharmacy. Please check with your benefits regarding applicable copays or out of pocket expenses.  The Shingrix is given in 2 vaccines approx 8  To 12 weeks apart. You must receive the 2nd dose prior to 6 months from receipt of the first. Please have the pharmacist print out you Immunization  dates for our office records     These are the goals we discussed: Goals    . Exercise 150 min/wk Moderate Activity     More golfing and fishing !       This is a list of the screening recommended for you and due dates:  Health Maintenance  Topic Date Due  . Tetanus Vaccine  09/02/2017  . Flu Shot  03/25/2018  . Pneumonia vaccines  Completed      Fall Prevention in the Home Falls can cause injuries. They can happen to people of all ages. There are many things you can do to make your home safe and to help prevent falls. What can I do on the outside of my home?  Regularly fix the edges of walkways and driveways and fix any cracks.  Remove anything that might make you trip as you walk through a door, such as a raised step or threshold.  Trim any bushes or trees on the path to your home.  Use bright outdoor  lighting.  Clear any walking paths of anything that might make someone trip, such as rocks or tools.  Regularly check to see if handrails are loose or broken. Make sure that both sides of any steps have handrails.  Any raised decks and porches should have guardrails on the edges.  Have any leaves, snow, or ice cleared regularly.  Use sand or salt on walking paths during winter.  Clean up any spills in your garage right away. This includes oil or grease spills. What can I do in the bathroom?  Use night lights.  Install grab bars by the toilet and in the tub and shower. Do not use towel bars as grab bars.  Use non-skid mats or decals in the tub or shower.  If you need to sit down in the shower, use a plastic, non-slip stool.  Keep the floor dry. Clean up any water that spills on the floor as soon as it happens.  Remove soap buildup in the tub or shower regularly.  Attach bath mats securely with double-sided non-slip rug tape.  Do not have throw rugs and other things on the floor that can make you trip. What can I do in the bedroom?  Use night lights.  Make sure that  you have a light by your bed that is easy to reach.  Do not use any sheets or blankets that are too big for your bed. They should not hang down onto the floor.  Have a firm chair that has side arms. You can use this for support while you get dressed.  Do not have throw rugs and other things on the floor that can make you trip. What can I do in the kitchen?  Clean up any spills right away.  Avoid walking on wet floors.  Keep items that you use a lot in easy-to-reach places.  If you need to reach something above you, use a strong step stool that has a grab bar.  Keep electrical cords out of the way.  Do not use floor polish or wax that makes floors slippery. If you must use wax, use non-skid floor wax.  Do not have throw rugs and other things on the floor that can make you trip. What can I do with my  stairs?  Do not leave any items on the stairs.  Make sure that there are handrails on both sides of the stairs and use them. Fix handrails that are broken or loose. Make sure that handrails are as long as the stairways.  Check any carpeting to make sure that it is firmly attached to the stairs. Fix any carpet that is loose or worn.  Avoid having throw rugs at the top or bottom of the stairs. If you do have throw rugs, attach them to the floor with carpet tape.  Make sure that you have a light switch at the top of the stairs and the bottom of the stairs. If you do not have them, ask someone to add them for you. What else can I do to help prevent falls?  Wear shoes that: ? Do not have high heels. ? Have rubber bottoms. ? Are comfortable and fit you well. ? Are closed at the toe. Do not wear sandals.  If you use a stepladder: ? Make sure that it is fully opened. Do not climb a closed stepladder. ? Make sure that both sides of the stepladder are locked into place. ? Ask someone to hold it for you, if possible.  Clearly mark and make sure that you can see: ? Any grab bars or handrails. ? First and last steps. ? Where the edge of each step is.  Use tools that help you move around (mobility aids) if they are needed. These include: ? Canes. ? Walkers. ? Scooters. ? Crutches.  Turn on the lights when you go into a dark area. Replace any light bulbs as soon as they burn out.  Set up your furniture so you have a clear path. Avoid moving your furniture around.  If any of your floors are uneven, fix them.  If there are any pets around you, be aware of where they are.  Review your medicines with your doctor. Some medicines can make you feel dizzy. This can increase your chance of falling. Ask your doctor what other things that you can do to help prevent falls. This information is not intended to replace advice given to you by your health care provider. Make sure you discuss any  questions you have with your health care provider. Document Released: 06/07/2009 Document Revised: 01/17/2016 Document Reviewed: 09/15/2014 Elsevier Interactive Patient Education  2018 [place] Maintenance, Male A healthy lifestyle and preventive care is important for your health and wellness. Ask  your health care provider about what schedule of regular examinations is right for you. What should I know about weight and diet? Eat a Healthy Diet  Eat plenty of vegetables, fruits, whole grains, low-fat dairy products, and lean protein.  Do not eat a lot of foods high in solid fats, added sugars, or salt.  Maintain a Healthy Weight Regular exercise can help you achieve or maintain a healthy weight. You should:  Do at least 150 minutes of exercise each week. The exercise should increase your heart rate and make you sweat (moderate-intensity exercise).  Do strength-training exercises at least twice a week.  Watch Your Levels of Cholesterol and Blood Lipids  Have your blood tested for lipids and cholesterol every 5 years starting at 81 years of age. If you are at high risk for heart disease, you should start having your blood tested when you are 81 years old. You may need to have your cholesterol levels checked more often if: ? Your lipid or cholesterol levels are high. ? You are older than 81 years of age. ? You are at high risk for heart disease.  What should I know about cancer screening? Many types of cancers can be detected early and may often be prevented. Lung Cancer  You should be screened every year for lung cancer if: ? You are a current smoker who has smoked for at least 30 years. ? You are a former smoker who has quit within the past 15 years.  Talk to your health care provider about your screening options, when you should start screening, and how often you should be screened.  Colorectal Cancer  Routine colorectal cancer screening usually begins at 81  years of age and should be repeated every 5-10 years until you are 81 years old. You may need to be screened more often if early forms of precancerous polyps or small growths are found. Your health care provider may recommend screening at an earlier age if you have risk factors for colon cancer.  Your health care provider may recommend using home test kits to check for hidden blood in the stool.  A small camera at the end of a tube can be used to examine your colon (sigmoidoscopy or colonoscopy). This checks for the earliest forms of colorectal cancer.  Prostate and Testicular Cancer  Depending on your age and overall health, your health care provider may do certain tests to screen for prostate and testicular cancer.  Talk to your health care provider about any symptoms or concerns you have about testicular or prostate cancer.  Skin Cancer  Check your skin from head to toe regularly.  Tell your health care provider about any new moles or changes in moles, especially if: ? There is a change in a mole's size, shape, or color. ? You have a mole that is larger than a pencil eraser.  Always use sunscreen. Apply sunscreen liberally and repeat throughout the day.  Protect yourself by wearing long sleeves, pants, a wide-brimmed hat, and sunglasses when outside.  What should I know about heart disease, diabetes, and high blood pressure?  If you are 33-44 years of age, have your blood pressure checked every 3-5 years. If you are 109 years of age or older, have your blood pressure checked every year. You should have your blood pressure measured twice-once when you are at a hospital or clinic, and once when you are not at a hospital or clinic. Record the average of the two measurements. To check  your blood pressure when you are not at a hospital or clinic, you can use: ? An automated blood pressure machine at a pharmacy. ? A home blood pressure monitor.  Talk to your health care provider about your  target blood pressure.  If you are between 87-52 years old, ask your health care provider if you should take aspirin to prevent heart disease.  Have regular diabetes screenings by checking your fasting blood sugar level. ? If you are at a normal weight and have a low risk for diabetes, have this test once every three years after the age of 40. ? If you are overweight and have a high risk for diabetes, consider being tested at a younger age or more often.  A one-time screening for abdominal aortic aneurysm (AAA) by ultrasound is recommended for men aged 20-75 years who are current or former smokers. What should I know about preventing infection? Hepatitis B If you have a higher risk for hepatitis B, you should be screened for this virus. Talk with your health care provider to find out if you are at risk for hepatitis B infection. Hepatitis C Blood testing is recommended for:  Everyone born from [birth year] through 1963.  Anyone with known risk factors for hepatitis C.  Sexually Transmitted Diseases (STDs)  You should be screened each year for STDs including gonorrhea and chlamydia if: ? You are sexually active and are younger than 81 years of age. ? You are older than 81 years of age and your health care provider tells you that you are at risk for this type of infection. ? Your sexual activity has changed since you were last screened and you are at an increased risk for chlamydia or gonorrhea. Ask your health care provider if you are at risk.  Talk with your health care provider about whether you are at high risk of being infected with HIV. Your health care provider may recommend a prescription medicine to help prevent HIV infection.  What else can I do?  Schedule regular health, dental, and eye exams.  Stay current with your vaccines (immunizations).  Do not use any tobacco products, such as cigarettes, chewing tobacco, and e-cigarettes. If you need help quitting, ask your health care  provider.  Limit alcohol intake to no more than 2 drinks per day. One drink equals 12 ounces of beer, 5 ounces of wine, or 1 ounces of hard liquor.  Do not use street drugs.  Do not share needles.  Ask your health care provider for help if you need support or information about quitting drugs.  Tell your health care provider if you often feel depressed.  Tell your health care provider if you have ever been abused or do not feel safe at home. This information is not intended to replace advice given to you by your health care provider. Make sure you discuss any questions you have with your health care provider. Document Released: 02/07/2008 Document Revised: 04/09/2016 Document Reviewed: 05/15/2015 Elsevier Interactive Patient Education  [employer].

## 2017-12-23 NOTE — Progress Notes (Signed)
I have reviewed documentation for AWV and Advance Care Planning provided by the health coach and agree with documentation. I was immediately available for questions.  

## 2018-01-26 DIAGNOSIS — M7541 Impingement syndrome of right shoulder: Secondary | ICD-10-CM | POA: Diagnosis not present

## 2018-01-26 DIAGNOSIS — M7542 Impingement syndrome of left shoulder: Secondary | ICD-10-CM | POA: Diagnosis not present

## 2018-02-09 ENCOUNTER — Ambulatory Visit (INDEPENDENT_AMBULATORY_CARE_PROVIDER_SITE_OTHER): Payer: Medicare Other | Admitting: Adult Health

## 2018-02-09 ENCOUNTER — Encounter: Payer: Self-pay | Admitting: Adult Health

## 2018-02-09 VITALS — BP 148/70 | Temp 98.4°F | Ht 67.5 in | Wt 150.0 lb

## 2018-02-09 DIAGNOSIS — K219 Gastro-esophageal reflux disease without esophagitis: Secondary | ICD-10-CM

## 2018-02-09 DIAGNOSIS — R5383 Other fatigue: Secondary | ICD-10-CM | POA: Diagnosis not present

## 2018-02-09 DIAGNOSIS — E785 Hyperlipidemia, unspecified: Secondary | ICD-10-CM | POA: Diagnosis not present

## 2018-02-09 DIAGNOSIS — I10 Essential (primary) hypertension: Secondary | ICD-10-CM

## 2018-02-09 DIAGNOSIS — R252 Cramp and spasm: Secondary | ICD-10-CM | POA: Diagnosis not present

## 2018-02-09 DIAGNOSIS — J302 Other seasonal allergic rhinitis: Secondary | ICD-10-CM | POA: Diagnosis not present

## 2018-02-09 LAB — HEPATIC FUNCTION PANEL
ALT: 12 U/L (ref 0–53)
AST: 14 U/L (ref 0–37)
Albumin: 4.1 g/dL (ref 3.5–5.2)
Alkaline Phosphatase: 48 U/L (ref 39–117)
Bilirubin, Direct: 0.1 mg/dL (ref 0.0–0.3)
Total Bilirubin: 0.6 mg/dL (ref 0.2–1.2)
Total Protein: 6.4 g/dL (ref 6.0–8.3)

## 2018-02-09 LAB — CBC WITH DIFFERENTIAL/PLATELET
Basophils Absolute: 0 10*3/uL (ref 0.0–0.1)
Basophils Relative: 0.5 % (ref 0.0–3.0)
Eosinophils Absolute: 0 10*3/uL (ref 0.0–0.7)
Eosinophils Relative: 0.2 % (ref 0.0–5.0)
HCT: 43 % (ref 39.0–52.0)
Hemoglobin: 14.4 g/dL (ref 13.0–17.0)
Lymphocytes Relative: 22.8 % (ref 12.0–46.0)
Lymphs Abs: 2.2 10*3/uL (ref 0.7–4.0)
MCHC: 33.5 g/dL (ref 30.0–36.0)
MCV: 91.4 fl (ref 78.0–100.0)
Monocytes Absolute: 0.5 10*3/uL (ref 0.1–1.0)
Monocytes Relative: 5.5 % (ref 3.0–12.0)
Neutro Abs: 6.7 10*3/uL (ref 1.4–7.7)
Neutrophils Relative %: 71 % (ref 43.0–77.0)
Platelets: 269 10*3/uL (ref 150.0–400.0)
RBC: 4.7 Mil/uL (ref 4.22–5.81)
RDW: 13.5 % (ref 11.5–15.5)
WBC: 9.5 10*3/uL (ref 4.0–10.5)

## 2018-02-09 LAB — BASIC METABOLIC PANEL
BUN: 19 mg/dL (ref 6–23)
CO2: 29 mEq/L (ref 19–32)
Calcium: 9.5 mg/dL (ref 8.4–10.5)
Chloride: 103 mEq/L (ref 96–112)
Creatinine, Ser: 1.15 mg/dL (ref 0.40–1.50)
GFR: 64.9 mL/min (ref >60.00)
Glucose, Bld: 93 mg/dL (ref 70–99)
Potassium: 5 mEq/L (ref 3.5–5.1)
Sodium: 139 mEq/L (ref 135–145)

## 2018-02-09 LAB — MAGNESIUM: Magnesium: 2 mg/dL (ref 1.5–2.5)

## 2018-02-09 LAB — LIPID PANEL
Cholesterol: 227 mg/dL — ABNORMAL HIGH (ref 0–200)
HDL: 57 mg/dL (ref >39.00)
LDL Cholesterol: 148 mg/dL — ABNORMAL HIGH (ref 0–99)
NonHDL: 169.86
Total CHOL/HDL Ratio: 4
Triglycerides: 109 mg/dL (ref 0.0–149.0)
VLDL: 21.8 mg/dL (ref 0.0–40.0)

## 2018-02-09 MED ORDER — FLUTICASONE PROPIONATE 50 MCG/ACT NA SUSP: 2.0000 | Freq: Every day | NASAL | 6 refills | Status: DC

## 2018-02-09 NOTE — Progress Notes (Signed)
Subjective:    Patient ID: Jason Farmer, male    DOB: Oct 24, 1934, 83 y.o.   MRN: [id number]  HPI Patient presents for yearly preventative medicine examination. He is a pleasant, active and healthy 83 year old male who  has a past medical history of Anxiety, Arthritis, Chronic lower back pain, Essential tremor, GERD (gastroesophageal reflux disease), Glaucoma, Heart murmur, Hepatitis (~ 1958), HOH (hard of hearing), Hyperlipemia, Migraine, Seasonal allergies, TIA (transient ischemic attack) (04/2015), Wears glasses, and Wears hearing aid.  He is also seen at the [place]  Hyperlipidemia - Takes Crestor 10 mg every other night and ASA 81 mg.  Does continue to get pretty severe cramping in bilateral lower extremities from time to time.  Feels as though this is the result of the Crestor.  He is staying hydrated and tries to be active. Lab Results  Component Value Date   CHOL 235 (H) 09/16/2016   HDL 51.70 09/16/2016   LDLCALC 159 (H) 09/16/2016   LDLDIRECT 139.5 09/01/2013   TRIG 122.0 09/16/2016   CHOLHDL 5 09/16/2016   GERD - Controlled with Prilosec   Hypertension - Takes Atenolol 12.5 mg daily.  BP Readings from Last 3 Encounters:  02/09/18 (!) 148/70  12/23/17 128/60  07/13/17 (!) 150/74    Seasonal allergies -currently prescribed Claritin 10 mg tablets daily.  He reports that the spring and summer have been especially bad for his allergies.  He has over the last couple weeks started feeling more dizzy fatigue, which is not uncommon for him allergies or other worse.  Does not feel as though Claritin is really helping him with his symptoms.  All immunizations and health maintenance protocols were reviewed with the patient and needed orders were placed.  Appropriate screening laboratory values were ordered for the patient including screening of hyperlipidemia, renal function and hepatic function. If indicated by BPH, a PSA was ordered.  Medication reconciliation,  past medical  history, social history, problem list and allergies were reviewed in detail with the patient  Goals were established with regard to weight loss, exercise, and  diet in compliance with medications.  He has been the main caregiver for his wife who has had multiple orthopedic problems over the last year, due to this he has not been able to make it to the gym in the last few months.  He continues to try any heart healthy diet   He continues to go to the gym and work out multiple times per week. He also stays active with fishing.  Wt Readings from Last 3 Encounters:  02/09/18 150 lb (68 kg)  12/23/17 156 lb 8 oz (71 kg)  07/13/17 158 lb 8 oz (71.9 kg)   End of life planning was discussed. He has an advanced directive and living will.    Review of Systems  Constitutional: Positive for fatigue.  HENT: Negative.   Eyes: Negative.   Respiratory: Negative.   Cardiovascular: Negative.   Gastrointestinal: Negative.   Endocrine: Negative.   Genitourinary: Negative.   Musculoskeletal: Negative.   Skin: Negative.   Allergic/Immunologic: Negative.   Neurological: Positive for dizziness.  Hematological: Negative.   Psychiatric/Behavioral: Negative.   All other systems reviewed and are negative.  Past Medical History:  Diagnosis Date  . Anxiety    "not a problem for a long time" (11/27/2016)  . Arthritis    "wrists, knees, shoulders, back" (11/27/2016)  . Chronic lower back pain    "5th disc is protruding" (11/27/2016)  . Essential  tremor   . GERD (gastroesophageal reflux disease)   . Glaucoma   . Heart murmur    dx'd in [employer] in the 1960s; haven't been seen for it since "(11/27/2016)  . Hepatitis ~ 1958   "jaundice kind"  . HOH (hard of hearing)   . Hyperlipemia   . Migraine    "none since my neck OR" (11/27/2016)  . Seasonal allergies   . TIA (transient ischemic attack) 04/2015  . Wears glasses   . Wears hearing aid     Social History   Socioeconomic History  . Marital status: Married     Spouse name: Not on file  . Number of children: Not on file  . Years of education: Not on file  . Highest education level: Not on file  Occupational History  . Not on file  Social Needs  . Financial resource strain: Not on file  . Food insecurity:    Worry: Not on file    Inability: Not on file  . Transportation needs:    Medical: Not on file    Non-medical: Not on file  Tobacco Use  . Smoking status: Former Smoker    Packs/day: 2.00    Years: 30.00    Pack years: 60.00    Types: Cigarettes    Last attempt to quit: 06/03/1984    Years since quitting: 33.7  . Smokeless tobacco: Never Used  . Tobacco comment: had AAA at the [facility]; also noted 2018 on Dr. Kyla Balzarine note   Substance and Sexual Activity  . Alcohol use: No    Comment: 11/27/2016 "quit in 1985"  . Drug use: No  . Sexual activity: Not Currently  Lifestyle  . Physical activity:    Days per week: Not on file    Minutes per session: Not on file  . Stress: Not on file  Relationships  . Social connections:    Talks on phone: Not on file    Gets together: Not on file    Attends religious service: Not on file    Active member of club or organization: Not on file    Attends meetings of clubs or organizations: Not on file    Relationship status: Not on file  . Intimate partner violence:    Fear of current or ex partner: Not on file    Emotionally abused: Not on file    Physically abused: Not on file    Forced sexual activity: Not on file  Other Topics Concern  . Not on file  Social History Narrative   Retired from [employer]   Married    4 children, one son lives locally    Past Surgical History:  Procedure Laterality Date  . ANTERIOR CERVICAL DECOMP/DISCECTOMY FUSION  2007  . APPENDECTOMY    . BACK SURGERY    . CARPAL TUNNEL RELEASE Right 10/12/2013   Procedure: RIGHT CARPAL TUNNEL RELEASE;  Surgeon: Wynonia Sours, MD;  Location: [place];  Service: Orthopedics;  Laterality: Right;  .  CATARACT EXTRACTION W/ INTRAOCULAR LENS  IMPLANT, BILATERAL Bilateral   . COLONOSCOPY    . HEMORRHOID SURGERY    . KNEE ARTHROSCOPY Left 1970s  . NASAL SEPTOPLASTY W/ TURBINOPLASTY Bilateral 02/15/2013   Procedure: NASAL SEPTOPLASTY WITH BILATER TURBINATE REDUCTION;  Surgeon: Ascencion Dike, MD;  Location: [place];  Service: ENT;  Laterality: Bilateral;  . TONSILLECTOMY    . TRIGGER FINGER RELEASE  06/08/2012   Procedure: RELEASE TRIGGER FINGER/A-1 PULLEY;  Surgeon: Wynonia Sours, MD;  Location: [place];  Service: Orthopedics;  Laterality: Left;  Release A-1 Pulley Left Long Finger  . TRIGGER FINGER RELEASE  07/14/2012   Procedure: RELEASE TRIGGER FINGER/A-1 PULLEY;  Surgeon: Wynonia Sours, MD;  Location: [place];  Service: Orthopedics;  Laterality: Right;  . TRIGGER FINGER RELEASE Right 10/12/2013   Procedure: RELEASE TRIGGER FINGER/A-1 PULLEY RIGHT MIDDLE FINGER;  Surgeon: Wynonia Sours, MD;  Location: [place];  Service: Orthopedics;  Laterality: Right;  . UPPER GASTROINTESTINAL ENDOSCOPY      Family History  Problem Relation Age of Onset  . Dementia Father   . Heart failure Father   . Glaucoma Mother   . Colon cancer Neg Hx     Allergies  Allergen Reactions  . Statins Other (See Comments)    Muscle cramps    Current Outpatient Medications on File Prior to Visit  Medication Sig Dispense Refill  . aspirin EC 81 MG tablet Take 81 mg by mouth daily.    Marland Kitchen atenolol (TENORMIN) 25 MG tablet Take 0.5 tablets (12.5 mg total) by mouth daily. 45 tablet 0  . latanoprost (XALATAN) 0.005 % ophthalmic solution Place 1 drop into both eyes at bedtime.     Marland Kitchen loratadine (CLARITIN) 10 MG tablet Take 1 tablet (10 mg total) by mouth daily. 90 tablet 3  . omeprazole (PRILOSEC) 20 MG capsule Take 1 capsule (20 mg total) by mouth daily. 90 capsule 0  . rosuvastatin (CRESTOR) 20 MG tablet Take 1 tablet (20 mg total) by mouth daily. 90 tablet  0  . nitroGLYCERIN (NITROSTAT) 0.4 MG SL tablet Place 1 tablet (0.4 mg total) under the tongue every 5 (five) minutes as needed for chest pain. 25 tablet 2   No current facility-administered medications on file prior to visit.     BP (!) 148/70   Temp 98.4 F (36.9 C) (Oral)   Ht 5' 7.5" (1.715 m)   Wt 150 lb (68 kg)   BMI 23.15 kg/m       Objective:   Physical Exam  Constitutional: He is oriented to person, place, and time. He appears well-developed and well-nourished. No distress.  HENT:  Head: Normocephalic and atraumatic.  Right Ear: External ear normal. Tympanic membrane is bulging. Tympanic membrane is not erythematous. A middle ear effusion is present.  Left Ear: External ear normal. Tympanic membrane is bulging. Tympanic membrane is not erythematous. A middle ear effusion is present.  Nose: Rhinorrhea present. No mucosal edema. Right sinus exhibits no maxillary sinus tenderness and no frontal sinus tenderness. Left sinus exhibits no maxillary sinus tenderness and no frontal sinus tenderness.  Mouth/Throat: Uvula is midline, oropharynx is clear and moist and mucous membranes are normal. No oropharyngeal exudate.  Eyes: Pupils are equal, round, and reactive to light. Conjunctivae and EOM are normal. Right eye exhibits no discharge. Left eye exhibits no discharge. No scleral icterus.  Neck: Normal range of motion. Neck supple. No JVD present. No tracheal deviation present. No thyromegaly present.  Cardiovascular: Normal rate, regular rhythm, normal heart sounds and intact distal pulses. Exam reveals no gallop and no friction rub.  No murmur heard. Pulmonary/Chest: Effort normal and breath sounds normal. No stridor. No respiratory distress. He has no wheezes. He has no rales. He exhibits no tenderness.  Abdominal: Soft. Bowel sounds are normal. He exhibits no distension and no mass. There is no tenderness. There is no rebound and no guarding. No hernia.  Genitourinary:    Genitourinary Comments: Deferred due to age  Musculoskeletal: Normal range of motion. He exhibits no edema, tenderness or deformity.  Lymphadenopathy:    He has no cervical adenopathy.  Neurological: He is alert and oriented to person, place, and time. He displays normal reflexes. No cranial nerve deficit or sensory deficit. He exhibits normal muscle tone. Coordination normal.  Skin: Skin is warm and dry. Capillary refill takes less than 2 seconds. No rash noted. He is not diaphoretic. No erythema. No pallor.  Psychiatric: He has a normal mood and affect. His behavior is normal. Judgment and thought content normal.  Nursing note and vitals reviewed.      Assessment & Plan:  1. Essential hypertension, benign - near goal. Asked to monitor BP at home and report readings back  - No changes at this time  - Encouraged heart healthy diet and exercise  - Magnesium - Basic metabolic panel - CBC with Differential/Platelet - Hepatic function panel - Lipid panel - TSH - Vitamin D, 25-hydroxy  2. Gastroesophageal reflux disease, esophagitis presence not specified - Controlled with Prilosec   3. Hyperlipidemia, unspecified hyperlipidemia type -Consider changing the simvastatin - Magnesium - Basic metabolic panel - CBC with Differential/Platelet - Hepatic function panel - Lipid panel - TSH - Vitamin D, 25-hydroxy  4. Muscle cramping -Likely due to Crestor.  Consider changing to simvastatin.  5 stretching exercises before going to bed - Magnesium  5. Seasonal allergies  - fluticasone (FLONASE) 50 MCG/ACT nasal spray; Place 2 sprays into both nostrils daily.  Dispense: 16 g; Refill: 6  6. Fatigue, unspecified type -Possibly multifactorial due to seasonal allergies and him not being able to exercise over the last few months - Magnesium - Basic metabolic panel - CBC with Differential/Platelet - Hepatic function panel - Lipid panel - TSH - Vitamin D, 25-hydroxy  Dorothyann Peng,  NP

## 2018-02-10 LAB — TSH: TSH: 0.87 u[IU]/mL (ref 0.35–4.50)

## 2018-02-10 LAB — VITAMIN D 25 HYDROXY (VIT D DEFICIENCY, FRACTURES): VITD: 20.33 ng/mL — ABNORMAL LOW (ref 30.00–100.00)

## 2018-02-11 ENCOUNTER — Other Ambulatory Visit: Payer: Self-pay | Admitting: Family Medicine

## 2018-02-11 MED ORDER — SIMVASTATIN 10 MG PO TABS: 10.0000 mg | ORAL_TABLET | Freq: Every day | ORAL | 3 refills | Status: DC

## 2018-02-11 NOTE — Telephone Encounter (Signed)
Sent to the pharmacy by e-scribe. 

## 2018-03-10 DIAGNOSIS — M7542 Impingement syndrome of left shoulder: Secondary | ICD-10-CM | POA: Diagnosis not present

## 2018-03-16 DIAGNOSIS — H26492 Other secondary cataract, left eye: Secondary | ICD-10-CM | POA: Diagnosis not present

## 2018-03-16 DIAGNOSIS — H401131 Primary open-angle glaucoma, bilateral, mild stage: Secondary | ICD-10-CM | POA: Diagnosis not present

## 2018-03-17 DIAGNOSIS — M7542 Impingement syndrome of left shoulder: Secondary | ICD-10-CM | POA: Diagnosis not present

## 2018-03-22 DIAGNOSIS — H26492 Other secondary cataract, left eye: Secondary | ICD-10-CM | POA: Diagnosis not present

## 2018-03-24 DIAGNOSIS — M7542 Impingement syndrome of left shoulder: Secondary | ICD-10-CM | POA: Diagnosis not present

## 2018-04-06 DIAGNOSIS — H401131 Primary open-angle glaucoma, bilateral, mild stage: Secondary | ICD-10-CM | POA: Diagnosis not present

## 2018-04-07 ENCOUNTER — Ambulatory Visit: Payer: Self-pay | Admitting: *Deleted

## 2018-04-07 NOTE — Telephone Encounter (Signed)
Pt called because he has changed the way he is taking his simvastatin. He said that he was taking it every day and started to have muscle cramps like when he was taking the Crestor. So he decided to take his simvastatin every other day and he is doing a lot better, cramping is down.  He wants his prescription to reflect this if this is ok with his provider.  He is also taking asa daily. He has thin skin so he bruises and bleeds when he bumps or hits himself. He has bruises on his arms, hands and legs. This has been going on a very long time. So he wants to know if he needs to take the asa or any changes could be made as to how it is prescribed. Appointment scheduled per protocol.  Reason for Disposition . Bruising easily is a chronic symptom (recurrent or ongoing AND present > 4 weeks)  Answer Assessment - Initial Assessment Questions 1. APPEARANCE of BRUISE: "Describe the bruise."      Old and new 2. SIZE: "How large is the bruise?"      Some large and some small 3. NUMBER: "How many bruises are there?"      All over hands, legs, arms 4. LOCATION: "Where is the bruise located?"      See #3 5. ONSET: "How long ago did the bruise occur?"      For a while 6. CAUSE: "Tell me how it happened."     Taking Asprin and having thin skin. 7. MEDICAL HISTORY: "Do you have any medical problems that can cause easy bruising or bleeding?" (e.g., leukemia, liver disease, recent chemotherapy)     no 8. MEDICATIONS : "Do you take any medications which thin the blood such as: aspirin, heparin, ibuprofen (NSAIDS), Plavix, or Coumadin?"     asprin 9. OTHER SYMPTOMS: "Do you have any other symptoms?"  (e.g., weakness, dizziness, pain, fever, nosebleed, blood in urine/stool)     no  Protocols used: BRUISES-A-AH

## 2018-04-13 ENCOUNTER — Ambulatory Visit (INDEPENDENT_AMBULATORY_CARE_PROVIDER_SITE_OTHER): Payer: Medicare Other | Admitting: Adult Health

## 2018-04-13 ENCOUNTER — Encounter: Payer: Self-pay | Admitting: Adult Health

## 2018-04-13 VITALS — BP 136/80 | HR 80 | Temp 97.9°F | Wt 150.6 lb

## 2018-04-13 DIAGNOSIS — Z79899 Other long term (current) drug therapy: Secondary | ICD-10-CM

## 2018-04-13 MED ORDER — SIMVASTATIN 10 MG PO TABS: 10.0000 mg | ORAL_TABLET | Freq: Every day | ORAL | 3 refills | Status: DC

## 2018-04-13 NOTE — Progress Notes (Signed)
Subjective:    Patient ID: Jason Farmer, male    DOB: 05/08/1935, 83 y.o.   MRN: [id number]  HPI 83 year old male who  has a past medical history of Anxiety, Arthritis, Chronic lower back pain, Essential tremor, GERD (gastroesophageal reflux disease), Glaucoma, Heart murmur, Hepatitis (~ 1958), HOH (hard of hearing), Hyperlipemia, Migraine, Seasonal allergies, TIA (transient ischemic attack) (04/2015), Wears glasses, and Wears hearing aid.  He presents to the office today to discuss changes in medications. He reports myalgia with Simvastatin ( he was switched from Crestor due to this issue). Reports that he started taking Simvastatin every other day and his symptoms improved.   He also reports bruising and prolonged bleeding with daily ASA. He would like to know if he needs to continue to take daily ASA.    Review of Systems See HPI   Past Medical History:  Diagnosis Date  . Anxiety    "not a problem for a long time" (11/27/2016)  . Arthritis    "wrists, knees, shoulders, back" (11/27/2016)  . Chronic lower back pain    "5th disc is protruding" (11/27/2016)  . Essential tremor   . GERD (gastroesophageal reflux disease)   . Glaucoma   . Heart murmur    dx'd in [employer] in the 1960s; haven't been seen for it since "(11/27/2016)  . Hepatitis ~ 1958   "jaundice kind"  . HOH (hard of hearing)   . Hyperlipemia   . Migraine    "none since my neck OR" (11/27/2016)  . Seasonal allergies   . TIA (transient ischemic attack) 04/2015  . Wears glasses   . Wears hearing aid     Social History   Socioeconomic History  . Marital status: Married    Spouse name: Not on file  . Number of children: Not on file  . Years of education: Not on file  . Highest education level: Not on file  Occupational History  . Not on file  Social Needs  . Financial resource strain: Not on file  . Food insecurity:    Worry: Not on file    Inability: Not on file  . Transportation needs:    Medical: Not on file      Non-medical: Not on file  Tobacco Use  . Smoking status: Former Smoker    Packs/day: 2.00    Years: 30.00    Pack years: 60.00    Types: Cigarettes    Last attempt to quit: 06/03/1984    Years since quitting: 33.8  . Smokeless tobacco: Never Used  . Tobacco comment: had AAA at [facility]; also noted 2018 on Dr. Kyla Balzarine note   Substance and Sexual Activity  . Alcohol use: No    Comment: 11/27/2016 "quit in 1985"  . Drug use: No  . Sexual activity: Not Currently  Lifestyle  . Physical activity:    Days per week: Not on file    Minutes per session: Not on file  . Stress: Not on file  Relationships  . Social connections:    Talks on phone: Not on file    Gets together: Not on file    Attends religious service: Not on file    Active member of club or organization: Not on file    Attends meetings of clubs or organizations: Not on file    Relationship status: Not on file  . Intimate partner violence:    Fear of current or ex partner: Not on file    Emotionally  abused: Not on file    Physically abused: Not on file    Forced sexual activity: Not on file  Other Topics Concern  . Not on file  Social History Narrative   Retired from [employer]   Married    4 children, one son lives locally    Past Surgical History:  Procedure Laterality Date  . ANTERIOR CERVICAL DECOMP/DISCECTOMY FUSION  2007  . APPENDECTOMY    . BACK SURGERY    . CARPAL TUNNEL RELEASE Right 10/12/2013   Procedure: RIGHT CARPAL TUNNEL RELEASE;  Surgeon: Wynonia Sours, MD;  Location: [place];  Service: Orthopedics;  Laterality: Right;  . CATARACT EXTRACTION W/ INTRAOCULAR LENS  IMPLANT, BILATERAL Bilateral   . COLONOSCOPY    . HEMORRHOID SURGERY    . KNEE ARTHROSCOPY Left 1970s  . NASAL SEPTOPLASTY W/ TURBINOPLASTY Bilateral 02/15/2013   Procedure: NASAL SEPTOPLASTY WITH BILATER TURBINATE REDUCTION;  Surgeon: Ascencion Dike, MD;  Location: [place];  Service: ENT;  Laterality:  Bilateral;  . TONSILLECTOMY    . TRIGGER FINGER RELEASE  06/08/2012   Procedure: RELEASE TRIGGER FINGER/A-1 PULLEY;  Surgeon: Wynonia Sours, MD;  Location: [place];  Service: Orthopedics;  Laterality: Left;  Release A-1 Pulley Left Long Finger  . TRIGGER FINGER RELEASE  07/14/2012   Procedure: RELEASE TRIGGER FINGER/A-1 PULLEY;  Surgeon: Wynonia Sours, MD;  Location: [place];  Service: Orthopedics;  Laterality: Right;  . TRIGGER FINGER RELEASE Right 10/12/2013   Procedure: RELEASE TRIGGER FINGER/A-1 PULLEY RIGHT MIDDLE FINGER;  Surgeon: Wynonia Sours, MD;  Location: [place];  Service: Orthopedics;  Laterality: Right;  . UPPER GASTROINTESTINAL ENDOSCOPY      Family History  Problem Relation Age of Onset  . Dementia Father   . Heart failure Father   . Glaucoma Mother   . Colon cancer Neg Hx     Allergies  Allergen Reactions  . Statins Other (See Comments)    Muscle cramps    Current Outpatient Medications on File Prior to Visit  Medication Sig Dispense Refill  . aspirin EC 81 MG tablet Take 81 mg by mouth daily.    . fexofenadine (ALLEGRA) 180 MG tablet Take 180 mg by mouth daily.    Marland Kitchen latanoprost (XALATAN) 0.005 % ophthalmic solution Place 1 drop into both eyes at bedtime.     Marland Kitchen omeprazole (PRILOSEC) 20 MG capsule Take 1 capsule (20 mg total) by mouth daily. 90 capsule 0  . simvastatin (ZOCOR) 10 MG tablet Take 1 tablet (10 mg total) by mouth at bedtime. (Patient taking differently: Take 10 mg by mouth every other day. ) 90 tablet 3  . atenolol (TENORMIN) 25 MG tablet Take 0.5 tablets (12.5 mg total) by mouth daily. 45 tablet 0  . nitroGLYCERIN (NITROSTAT) 0.4 MG SL tablet Place 1 tablet (0.4 mg total) under the tongue every 5 (five) minutes as needed for chest pain. 25 tablet 2   No current facility-administered medications on file prior to visit.     BP 136/80 (BP Location: Left Arm, Patient Position: Sitting, Cuff Size: Normal)    Pulse 80   Temp 97.9 F (36.6 C) (Oral)   Wt 150 lb 9.6 oz (68.3 kg)   SpO2 99%   BMI 23.24 kg/m       Objective:   Physical Exam  Constitutional: He is oriented to person, place, and time. He appears well-developed and well-nourished. No distress.  Cardiovascular:  Normal rate.  Pulmonary/Chest: Effort normal and breath sounds normal.  Neurological: He is alert and oriented to person, place, and time.  Skin: Skin is warm and dry. He is not diaphoretic.  Psychiatric: He has a normal mood and affect. His behavior is normal. Judgment and thought content normal.  Nursing note and vitals reviewed.     Assessment & Plan:  1. Medication management - I am ok with him taking simvastatin every other day  - I advised to him continue taking ASA daily due to past cardiac history.  - he is ok with this plan   Dorothyann Peng, NP

## 2018-04-22 DIAGNOSIS — M7542 Impingement syndrome of left shoulder: Secondary | ICD-10-CM | POA: Diagnosis not present

## 2018-04-22 DIAGNOSIS — M7541 Impingement syndrome of right shoulder: Secondary | ICD-10-CM | POA: Diagnosis not present

## 2018-04-29 ENCOUNTER — Other Ambulatory Visit: Payer: Self-pay | Admitting: Adult Health

## 2018-04-29 NOTE — Telephone Encounter (Signed)
Medication: omeprazole (PRILOSEC) 20 MG capsule   Has the patient contacted their pharmacy? No - pt was weaning off of medication, but that did not work. Heartburn has returned. (Agent: If no, request that the patient contact the pharmacy for the refill.) (Agent: If yes, when and what did the pharmacy advise?)   Preferred Pharmacy (with phone number or street name): [place], [place] [phone] (Phone) [phone] (Fax)   Agent: Please be advised that RX refills may take up to 3 business days. We ask that you follow-up with your pharmacy.

## 2018-04-29 NOTE — Telephone Encounter (Signed)
Ok to fill for one year  

## 2018-04-30 NOTE — Telephone Encounter (Signed)
Sent to the pharmacy by e-scribe. 

## 2018-06-07 ENCOUNTER — Ambulatory Visit (INDEPENDENT_AMBULATORY_CARE_PROVIDER_SITE_OTHER): Payer: Medicare Other

## 2018-06-07 DIAGNOSIS — Z23 Encounter for immunization: Secondary | ICD-10-CM | POA: Diagnosis not present

## 2018-06-10 ENCOUNTER — Other Ambulatory Visit: Payer: Self-pay | Admitting: Adult Health

## 2018-06-11 NOTE — Telephone Encounter (Signed)
Sort to the pharmacy by e-scribe.

## 2018-06-25 DIAGNOSIS — J300 Vasomotor rhinitis: Secondary | ICD-10-CM | POA: Diagnosis not present

## 2018-06-25 DIAGNOSIS — R42 Dizziness and giddiness: Secondary | ICD-10-CM | POA: Diagnosis not present

## 2018-06-25 DIAGNOSIS — J3089 Other allergic rhinitis: Secondary | ICD-10-CM | POA: Diagnosis not present

## 2018-06-25 DIAGNOSIS — J3081 Allergic rhinitis due to animal (cat) (dog) hair and dander: Secondary | ICD-10-CM | POA: Diagnosis not present

## 2018-08-04 ENCOUNTER — Inpatient Hospital Stay (HOSPITAL_COMMUNITY)
Admission: EM | Admit: 2018-08-04 | Discharge: 2018-08-08 | DRG: 871 | Disposition: A | Discharge status: Home / Self Care | Payer: Medicare Other | Attending: Internal Medicine | Admitting: Internal Medicine

## 2018-08-04 ENCOUNTER — Other Ambulatory Visit: Payer: Self-pay

## 2018-08-04 ENCOUNTER — Encounter (HOSPITAL_COMMUNITY): Payer: Self-pay | Admitting: Emergency Medicine

## 2018-08-04 ENCOUNTER — Emergency Department (HOSPITAL_COMMUNITY): Payer: Medicare Other

## 2018-08-04 DIAGNOSIS — J181 Lobar pneumonia, unspecified organism: Secondary | ICD-10-CM | POA: Diagnosis present

## 2018-08-04 DIAGNOSIS — J13 Pneumonia due to Streptococcus pneumoniae: Secondary | ICD-10-CM | POA: Diagnosis not present

## 2018-08-04 DIAGNOSIS — K219 Gastro-esophageal reflux disease without esophagitis: Secondary | ICD-10-CM | POA: Diagnosis present

## 2018-08-04 DIAGNOSIS — Z888 Allergy status to other drugs, medicaments and biological substances status: Secondary | ICD-10-CM | POA: Diagnosis not present

## 2018-08-04 DIAGNOSIS — G928 Other toxic encephalopathy: Secondary | ICD-10-CM | POA: Diagnosis present

## 2018-08-04 DIAGNOSIS — Z7982 Long term (current) use of aspirin: Secondary | ICD-10-CM | POA: Diagnosis not present

## 2018-08-04 DIAGNOSIS — R197 Diarrhea, unspecified: Secondary | ICD-10-CM | POA: Diagnosis not present

## 2018-08-04 DIAGNOSIS — R7881 Bacteremia: Secondary | ICD-10-CM | POA: Diagnosis present

## 2018-08-04 DIAGNOSIS — G92 Toxic encephalopathy: Secondary | ICD-10-CM | POA: Diagnosis not present

## 2018-08-04 DIAGNOSIS — Z8673 Personal history of transient ischemic attack (TIA), and cerebral infarction without residual deficits: Secondary | ICD-10-CM

## 2018-08-04 DIAGNOSIS — E785 Hyperlipidemia, unspecified: Secondary | ICD-10-CM | POA: Diagnosis present

## 2018-08-04 DIAGNOSIS — B953 Streptococcus pneumoniae as the cause of diseases classified elsewhere: Secondary | ICD-10-CM | POA: Diagnosis not present

## 2018-08-04 DIAGNOSIS — M545 Low back pain: Secondary | ICD-10-CM | POA: Diagnosis present

## 2018-08-04 DIAGNOSIS — F419 Anxiety disorder, unspecified: Secondary | ICD-10-CM | POA: Diagnosis present

## 2018-08-04 DIAGNOSIS — Z79899 Other long term (current) drug therapy: Secondary | ICD-10-CM

## 2018-08-04 DIAGNOSIS — R0602 Shortness of breath: Secondary | ICD-10-CM

## 2018-08-04 DIAGNOSIS — T3695XA Adverse effect of unspecified systemic antibiotic, initial encounter: Secondary | ICD-10-CM | POA: Diagnosis present

## 2018-08-04 DIAGNOSIS — R410 Disorientation, unspecified: Secondary | ICD-10-CM

## 2018-08-04 DIAGNOSIS — E860 Dehydration: Secondary | ICD-10-CM | POA: Diagnosis present

## 2018-08-04 DIAGNOSIS — N179 Acute kidney failure, unspecified: Secondary | ICD-10-CM | POA: Diagnosis present

## 2018-08-04 DIAGNOSIS — G250 Essential tremor: Secondary | ICD-10-CM | POA: Diagnosis present

## 2018-08-04 DIAGNOSIS — G8929 Other chronic pain: Secondary | ICD-10-CM | POA: Diagnosis present

## 2018-08-04 DIAGNOSIS — I10 Essential (primary) hypertension: Secondary | ICD-10-CM | POA: Diagnosis present

## 2018-08-04 DIAGNOSIS — J189 Pneumonia, unspecified organism: Secondary | ICD-10-CM | POA: Diagnosis not present

## 2018-08-04 DIAGNOSIS — R442 Other hallucinations: Secondary | ICD-10-CM | POA: Diagnosis not present

## 2018-08-04 DIAGNOSIS — H9190 Unspecified hearing loss, unspecified ear: Secondary | ICD-10-CM | POA: Diagnosis present

## 2018-08-04 DIAGNOSIS — Z87891 Personal history of nicotine dependence: Secondary | ICD-10-CM | POA: Diagnosis not present

## 2018-08-04 DIAGNOSIS — R918 Other nonspecific abnormal finding of lung field: Secondary | ICD-10-CM | POA: Diagnosis not present

## 2018-08-04 DIAGNOSIS — G2581 Restless legs syndrome: Secondary | ICD-10-CM | POA: Diagnosis not present

## 2018-08-04 DIAGNOSIS — R42 Dizziness and giddiness: Secondary | ICD-10-CM | POA: Diagnosis not present

## 2018-08-04 DIAGNOSIS — A403 Sepsis due to Streptococcus pneumoniae: Secondary | ICD-10-CM | POA: Diagnosis not present

## 2018-08-04 DIAGNOSIS — R4182 Altered mental status, unspecified: Secondary | ICD-10-CM | POA: Diagnosis not present

## 2018-08-04 DIAGNOSIS — F411 Generalized anxiety disorder: Secondary | ICD-10-CM | POA: Diagnosis present

## 2018-08-04 DIAGNOSIS — R52 Pain, unspecified: Secondary | ICD-10-CM | POA: Diagnosis not present

## 2018-08-04 DIAGNOSIS — A419 Sepsis, unspecified organism: Secondary | ICD-10-CM | POA: Diagnosis present

## 2018-08-04 LAB — URINALYSIS, ROUTINE W REFLEX MICROSCOPIC
Bilirubin Urine: NEGATIVE
Glucose, UA: NEGATIVE mg/dL
Ketones, ur: NEGATIVE mg/dL
Leukocytes, UA: NEGATIVE
Nitrite: NEGATIVE
Protein, ur: 30 mg/dL — AB
Specific Gravity, Urine: 1.02 (ref 1.005–1.030)
pH: 5 (ref 5.0–8.0)

## 2018-08-04 LAB — CBC
HCT: 42.8 % (ref 39.0–52.0)
Hemoglobin: 14.1 g/dL (ref 13.0–17.0)
MCH: 30.9 pg (ref 26.0–34.0)
MCHC: 32.9 g/dL (ref 30.0–36.0)
MCV: 93.7 fL (ref 80.0–100.0)
Platelets: 224 10*3/uL (ref 150–400)
RBC: 4.57 MIL/uL (ref 4.22–5.81)
RDW: 13.1 % (ref 11.5–15.5)
WBC: 30.7 10*3/uL — ABNORMAL HIGH (ref 4.0–10.5)
nRBC: 0 % (ref 0.0–0.2)

## 2018-08-04 LAB — COMPREHENSIVE METABOLIC PANEL
ALT: 13 U/L (ref 0–44)
AST: 16 U/L (ref 15–41)
Albumin: 3.5 g/dL (ref 3.5–5.0)
Alkaline Phosphatase: 44 U/L (ref 38–126)
Anion gap: 11 (ref 5–15)
BUN: 27 mg/dL — ABNORMAL HIGH (ref 8–23)
CO2: 23 mmol/L (ref 22–32)
Calcium: 8.7 mg/dL — ABNORMAL LOW (ref 8.9–10.3)
Chloride: 102 mmol/L (ref 98–111)
Creatinine, Ser: 1.97 mg/dL — ABNORMAL HIGH (ref 0.61–1.24)
GFR calc Af Amer: 36 mL/min — ABNORMAL LOW (ref >60)
GFR calc non Af Amer: 31 mL/min — ABNORMAL LOW (ref >60)
Glucose, Bld: 119 mg/dL — ABNORMAL HIGH (ref 70–99)
Potassium: 4.3 mmol/L (ref 3.5–5.1)
Sodium: 136 mmol/L (ref 135–145)
Total Bilirubin: 1.1 mg/dL (ref 0.3–1.2)
Total Protein: 6.5 g/dL (ref 6.5–8.1)

## 2018-08-04 LAB — SODIUM, URINE, RANDOM: Sodium, Ur: 73 mmol/L

## 2018-08-04 LAB — INFLUENZA PANEL BY PCR (TYPE A & B)
Influenza A By PCR: NEGATIVE
Influenza B By PCR: NEGATIVE

## 2018-08-04 LAB — I-STAT CG4 LACTIC ACID, ED: Lactic Acid, Venous: 1.51 mmol/L (ref 0.5–1.9)

## 2018-08-04 LAB — STREP PNEUMONIAE URINARY ANTIGEN: Strep Pneumo Urinary Antigen: POSITIVE — AB

## 2018-08-04 LAB — CREATININE, URINE, RANDOM: Creatinine, Urine: 235.82 mg/dL

## 2018-08-04 LAB — AMMONIA: Ammonia: 17 umol/L (ref 9–35)

## 2018-08-04 MED ORDER — ACETAMINOPHEN 325 MG PO TABS
650.0000 mg | ORAL_TABLET | Freq: Four times a day (QID) | ORAL | Status: DC | PRN
  Administered 2018-08-04 – 2018-08-08 (×2): 650 mg via ORAL
  Filled 2018-08-04 (×2): qty 2

## 2018-08-04 MED ORDER — SODIUM CHLORIDE 0.9 % IV SOLN
1.0000 g | INTRAVENOUS | Status: DC
  Filled 2018-08-04: qty 10

## 2018-08-04 MED ORDER — IPRATROPIUM BROMIDE 0.03 % NA SOLN: 2.0000 | Freq: Three times a day (TID) | NASAL | Status: DC | PRN

## 2018-08-04 MED ORDER — GUAIFENESIN-DM 100-10 MG/5ML PO SYRP: 5.0000 mL | ORAL_SOLUTION | ORAL | Status: DC | PRN

## 2018-08-04 MED ORDER — SODIUM CHLORIDE 0.9 % IV BOLUS (SEPSIS)
250.0000 mL | Freq: Once | INTRAVENOUS | Status: AC
  Administered 2018-08-04: 250 mL via INTRAVENOUS

## 2018-08-04 MED ORDER — PANTOPRAZOLE SODIUM 40 MG PO TBEC
40.0000 mg | DELAYED_RELEASE_TABLET | Freq: Every day | ORAL | Status: DC
  Administered 2018-08-04 – 2018-08-08 (×5): 40 mg via ORAL
  Filled 2018-08-04 (×5): qty 1

## 2018-08-04 MED ORDER — AZITHROMYCIN 250 MG PO TABS
500.0000 mg | ORAL_TABLET | ORAL | Status: DC
  Administered 2018-08-05 – 2018-08-08 (×4): 500 mg via ORAL
  Filled 2018-08-04 (×4): qty 2

## 2018-08-04 MED ORDER — SODIUM CHLORIDE 0.9 % IV SOLN
2.0000 g | INTRAVENOUS | Status: DC
  Administered 2018-08-04: 2 g via INTRAVENOUS
  Filled 2018-08-04: qty 20

## 2018-08-04 MED ORDER — ONDANSETRON HCL 4 MG PO TABS: 4.0000 mg | ORAL_TABLET | Freq: Four times a day (QID) | ORAL | Status: DC | PRN

## 2018-08-04 MED ORDER — HEPARIN SODIUM (PORCINE) 5000 UNIT/ML IJ SOLN
5000.0000 [IU] | Freq: Three times a day (TID) | INTRAMUSCULAR | Status: DC
  Administered 2018-08-04 – 2018-08-07 (×7): 5000 [IU] via SUBCUTANEOUS
  Filled 2018-08-04 (×7): qty 1

## 2018-08-04 MED ORDER — LORATADINE 10 MG PO TABS
10.0000 mg | ORAL_TABLET | Freq: Every day | ORAL | Status: DC
  Administered 2018-08-04 – 2018-08-06 (×3): 10 mg via ORAL
  Filled 2018-08-04 (×3): qty 1

## 2018-08-04 MED ORDER — SODIUM CHLORIDE 0.9 % IV SOLN
500.0000 mg | INTRAVENOUS | Status: DC
  Administered 2018-08-04: 500 mg via INTRAVENOUS
  Filled 2018-08-04: qty 500

## 2018-08-04 MED ORDER — SIMVASTATIN 10 MG PO TABS
10.0000 mg | ORAL_TABLET | ORAL | Status: DC
  Administered 2018-08-04 – 2018-08-08 (×3): 10 mg via ORAL
  Filled 2018-08-04 (×4): qty 1

## 2018-08-04 MED ORDER — LORAZEPAM 1 MG PO TABS
1.0000 mg | ORAL_TABLET | Freq: Once | ORAL | Status: AC
  Administered 2018-08-04: 1 mg via ORAL
  Filled 2018-08-04: qty 1

## 2018-08-04 MED ORDER — SODIUM CHLORIDE 0.9 % IV BOLUS
1000.0000 mL | Freq: Once | INTRAVENOUS | Status: AC
  Administered 2018-08-04: 1000 mL via INTRAVENOUS

## 2018-08-04 MED ORDER — LATANOPROST 0.005 % OP SOLN
1.0000 [drp] | Freq: Every day | OPHTHALMIC | Status: DC
  Administered 2018-08-04 – 2018-08-07 (×4): 1 [drp] via OPHTHALMIC
  Filled 2018-08-04: qty 2.5

## 2018-08-04 MED ORDER — ONDANSETRON HCL 4 MG/2ML IJ SOLN: 4.0000 mg | Freq: Four times a day (QID) | INTRAMUSCULAR | Status: DC | PRN

## 2018-08-04 MED ORDER — LEVOCETIRIZINE DIHYDROCHLORIDE 5 MG PO TABS: 5.0000 mg | ORAL_TABLET | Freq: Every evening | ORAL | Status: DC

## 2018-08-04 MED ORDER — SODIUM CHLORIDE 0.9 % IV BOLUS (SEPSIS)
1000.0000 mL | Freq: Once | INTRAVENOUS | Status: AC
  Administered 2018-08-04: 1000 mL via INTRAVENOUS

## 2018-08-04 MED ORDER — ACETAMINOPHEN 650 MG RE SUPP: 650.0000 mg | Freq: Four times a day (QID) | RECTAL | Status: DC | PRN

## 2018-08-04 MED ORDER — ASPIRIN EC 81 MG PO TBEC
81.0000 mg | DELAYED_RELEASE_TABLET | Freq: Every day | ORAL | Status: DC
  Administered 2018-08-04 – 2018-08-08 (×5): 81 mg via ORAL
  Filled 2018-08-04 (×5): qty 1

## 2018-08-04 MED ORDER — VITAMIN D3 25 MCG (1000 UNIT) PO TABS
1000.0000 [IU] | ORAL_TABLET | Freq: Every day | ORAL | Status: DC
  Administered 2018-08-05 – 2018-08-08 (×4): 1000 [IU] via ORAL
  Filled 2018-08-04 (×4): qty 1

## 2018-08-04 NOTE — ED Notes (Signed)
ED TO INPATIENT HANDOFF REPORT  Name/Age/Gender Jason Farmer 83 y.o. male  Code Status Code Status History    Date Active Date Inactive Code Status Order ID Comments User Context   11/27/2016 2049 11/28/2016 1858 Full Code [id number]  Etta Quill, DO ED      Home/SNF/Other Home  Chief Complaint AMS  Level of Care/Admitting Diagnosis ED Disposition    ED Disposition Condition [facility] Area: [facility] [100102]  Level of Care: Med-Surg [16]  Diagnosis: SIRS (systemic inflammatory response syndrome) [facility]) [308657]  Admitting Physician: Louellen Molder [phone]  Attending Physician: Louellen Molder [phone]  Estimated length of stay: past midnight tomorrow  Certification:: I certify this patient will need inpatient services for at least 2 midnights  PT Class (Do Not Modify): Inpatient [101]  PT Acc Code (Do Not Modify): Private [1]       Medical History Past Medical History:  Diagnosis Date  . Anxiety    "not a problem for a long time" (11/27/2016)  . Arthritis    "wrists, knees, shoulders, back" (11/27/2016)  . Chronic lower back pain    "5th disc is protruding" (11/27/2016)  . Essential tremor   . GERD (gastroesophageal reflux disease)   . Glaucoma   . Heart murmur    dx'd in [employer] in the 1960s; haven't been seen for it since "(11/27/2016)  . Hepatitis ~ 1958   "jaundice kind"  . HOH (hard of hearing)   . Hyperlipemia   . Migraine    "none since my neck OR" (11/27/2016)  . Seasonal allergies   . TIA (transient ischemic attack) 04/2015  . Wears glasses   . Wears hearing aid     Allergies Allergies  Allergen Reactions  . Statins Other (See Comments)    Muscle cramps     IV Location/Drains/Wounds Patient Lines/Drains/Airways Status   Active Line/Drains/Airways    Name:   Placement date:   Placement time:   Site:   Days:   Peripheral IV 08/04/18 Right Forearm   08/04/18    0654    Forearm   less than 1           Labs/Imaging Results for orders placed or performed during the hospital encounter of 08/04/18 (from the past 48 hour(s))  Comprehensive metabolic panel     Status: Abnormal   Collection Time: 08/04/18  6:52 AM  Result Value Ref Range   Sodium 136 135 - 145 mmol/L   Potassium 4.3 3.5 - 5.1 mmol/L   Chloride 102 98 - 111 mmol/L   CO2 23 22 - 32 mmol/L   Glucose, Bld 119 (H) 70 - 99 mg/dL   BUN 27 (H) 8 - 23 mg/dL   Creatinine, Ser 1.97 (H) 0.61 - 1.24 mg/dL   Calcium 8.7 (L) 8.9 - 10.3 mg/dL   Total Protein 6.5 6.5 - 8.1 g/dL   Albumin 3.5 3.5 - 5.0 g/dL   AST 16 15 - 41 U/L   ALT 13 0 - 44 U/L   Alkaline Phosphatase 44 38 - 126 U/L   Total Bilirubin 1.1 0.3 - 1.2 mg/dL   GFR calc non Af Amer 31 (L) >60 mL/min   GFR calc Af Amer 36 (L) >60 mL/min   Anion gap 11 5 - 15    Comment: Performed at [facility], [place] [address], [place], [place] [address]  CBC     Status: Abnormal   Collection Time: 08/04/18  6:52 AM  Result Value Ref Range   WBC 30.7 (H) 4.0 - 10.5 K/uL   RBC 4.57 4.22 - 5.81 MIL/uL   Hemoglobin 14.1 13.0 - 17.0 g/dL   HCT 42.8 39.0 - 52.0 %   MCV 93.7 80.0 - 100.0 fL   MCH 30.9 26.0 - 34.0 pg   MCHC 32.9 30.0 - 36.0 g/dL   RDW 13.1 11.5 - 15.5 %   Platelets 224 150 - 400 K/uL   nRBC 0.0 0.0 - 0.2 %    Comment: Performed at [facility], [place] [address], [place], [place] [address]  Ammonia     Status: None   Collection Time: 08/04/18  6:53 AM  Result Value Ref Range   Ammonia 17 9 - 35 umol/L    Comment: Performed at [facility], [place] [address] [place], [place] [address]  Influenza panel by PCR (type A & B)     Status: None   Collection Time: 08/04/18  8:47 AM  Result Value Ref Range   Influenza A By PCR NEGATIVE NEGATIVE   Influenza B By PCR NEGATIVE NEGATIVE    Comment: (NOTE) The Xpert Xpress Flu assay is intended as an aid in the diagnosis of  influenza and should not be used as a sole basis  for treatment.  This  assay is FDA approved for nasopharyngeal swab specimens only. Nasal  washings and aspirates are unacceptable for Xpert Xpress Flu testing. Performed at [facility], [place] [address], [place], [place] [address]   I-Stat CG4 Lactic Acid, ED     Status: None   Collection Time: 08/04/18  8:50 AM  Result Value Ref Range   Lactic Acid, Venous 1.51 0.5 - 1.9 mmol/L   Dg Chest 2 View  Result Date: 08/04/2018 CLINICAL DATA:  Cough. EXAM: CHEST - 2 VIEW COMPARISON:  Radiographs of November 27, 2016. FINDINGS: The heart size and mediastinal contours are within normal limits. No pneumothorax or pleural effusion is noted. Stable probable scarring is seen throughout both lungs. Increased airspace opacity is noted in left upper lobe consistent with pneumonia. The visualized skeletal structures are unremarkable. IMPRESSION: Left upper lobe pneumonia. Followup PA and lateral chest X-ray is recommended in 3-4 weeks following trial of antibiotic therapy to ensure resolution and exclude underlying malignancy. Electronically Signed   By: Marijo Conception, M.D.   On: 08/04/2018 07:43   Ct Head Wo Contrast  Result Date: 08/04/2018 CLINICAL DATA:  Dizziness, hallucinations. EXAM: CT HEAD WITHOUT CONTRAST TECHNIQUE: Contiguous axial images were obtained from the base of the skull through the vertex without intravenous contrast. COMPARISON:  MRI of May 16, 2015.  CT scan of April 24, 2015. FINDINGS: Brain: Mild diffuse cortical atrophy is noted. Mild chronic ischemic white matter disease is noted. Stable probable calcified meningioma seen in left posterior fossa. No acute hemorrhage or infarction is noted. No mass effect or midline shift is noted. Ventricular size is within normal limits. Vascular: No hyperdense vessel or unexpected calcification. Skull: Normal. Negative for fracture or focal lesion. Sinuses/Orbits: No acute finding. Other: None. IMPRESSION: Mild diffuse cortical  atrophy. Mild chronic ischemic white matter disease. Stable probable calcified meningioma seen in left posterior fossa. No acute intracranial abnormality seen. Electronically Signed   By: Marijo Conception, M.D.   On: 08/04/2018 07:49   None  Pending Labs Unresulted Labs (From admission, onward)    Start     Ordered   08/04/18 1004  Sodium,  urine, random  ONCE - STAT,   STAT     08/04/18 1003   08/04/18 1004  Creatinine, urine, random  Once,   STAT     08/04/18 1003   08/04/18 1003  Urinalysis, Routine w reflex microscopic  Once,   R     08/04/18 1003   08/04/18 0750  Blood culture (routine x 2)  BLOOD CULTURE X 2,   STAT     08/04/18 0749   Signed and Held  HIV antibody (Routine Screening)  Add-on,   R     Signed and Held   Signed and Held  Gram stain  Once,   R     Signed and Held   Signed and Held  Strep pneumoniae urinary antigen  Once,   R     Signed and Held   Signed and Held  CBC  (heparin)  Once,   R    Comments:  Baseline for heparin therapy IF NOT ALREADY DRAWN.  Notify MD if PLT < 100 K.    Signed and Held   Signed and Held  Creatinine, serum  (heparin)  Once,   R    Comments:  Baseline for heparin therapy IF NOT ALREADY DRAWN.    Signed and Held   Signed and Held  Basic metabolic panel  Tomorrow morning,   R     Signed and Held   Signed and Held  CBC  Tomorrow morning,   R     Signed and Held          Vitals/Pain Today's Vitals   08/04/18 0930 08/04/18 1000 08/04/18 1100 08/04/18 1200  BP: 111/63 109/64 103/61 109/69  Pulse: 90 86 (!) 145 87  Resp: (!) 21 (!) 21 (!) 22 (!) 22  Temp:      TempSrc:      SpO2: 99% 96% 94% 95%    Isolation Precautions Droplet precaution  Medications Medications  cefTRIAXone (ROCEPHIN) 2 g in sodium chloride 0.9 % 100 mL IVPB (0 g Intravenous Stopped 08/04/18 1012)  azithromycin (ZITHROMAX) 500 mg in sodium chloride 0.9 % 250 mL IVPB (0 mg Intravenous Stopped 08/04/18 1052)  sodium chloride 0.9 % bolus 1,000 mL (0 mLs  Intravenous Stopped 08/04/18 0924)  sodium chloride 0.9 % bolus 1,000 mL (0 mLs Intravenous Stopped 08/04/18 0924)    And  sodium chloride 0.9 % bolus 250 mL (0 mLs Intravenous Stopped 08/04/18 1012)    Mobility walks

## 2018-08-04 NOTE — ED Triage Notes (Signed)
[facility] was called because patient has been hallucinating about people that is not there last night. Patient gets dizzy and unbalanced when he stands up. Patient is not hallucinating this morning. Patient has a hx of TIA.

## 2018-08-04 NOTE — Progress Notes (Addendum)
Pt experiencing visual hallucinations. Pt states that he can still see the tv screen although, the tv is off and that his eyes are closed. Pt req that we take his iPad and bluetooth away (stored at the nursing station in drawer) because he thinks it is coming from these objects.

## 2018-08-04 NOTE — H&P (Addendum)
                                                                                                           TRH H&P   Patient Demographics:    Jason Farmer, is a 83 y.o. male  MRN: [id number]   DOB - 05/03/1935  Admit Date - 08/04/2018  Outpatient Primary MD for the patient is Nafziger, Cory, NP  Referring MD: Dr. Rees  Outpatient Specialists: None  Patient coming from: Home  Chief Complaint  Patient presents with  . Altered Mental Status      HPI:    Jason Farmer  is a 83 y.o. male, with history of hypertension, hyperlipidemia, GERD, restless leg syndrome who was brought to the ED by his wife with acute onset of confusion with visual and auditory hallucinations.  Associated symptoms include generalized weakness and unsteady gait.  Patient reports having chronic nonproductive cough but yesterday started having sudden pain in his left eye and left ear.  He also had an episode of nonbloody vomiting.  Denies back pain, dysuria or diarrhea.  Denies chest pain, shortness of breath, orthopnea, abdominal pain, palpitations, tingling or numbness of extremities.  Did not record temperature at home.  Denies any recent travel.  Wife reports having URI symptoms last week.  Denies any new medications.  Patient reports pain in his left shoulders which is chronic.  Vitals in the ED showed temperature of 100.2 F, respiratory rate of 21, blood pressure 96/49 mmHg, O2 sat >95% on room air.  Blood work showed WBC of 30.7K, hemoglobin of 14, BUN of 27 creatinine of 1.97 (baseline creatinine of 1), glucose of 119.  Lactic acid was normal.  Chest x-ray showed left lower lobe pneumonia.  Head CT was unremarkable for acute findings. Patient met criteria for early sepsis and sepsis pathway initiated.  Blood culture and flu PCR was sent.  Patient given empiric IV Rocephin and azithromycin along with 2 L normal saline bolus and blood pressure slowly improved. In the ED, he was confused and was not fully oriented  but after receiving IV fluids mental status started improving to baseline. Hospitalist consulted for admission to early sepsis/Sirs secondary to left lobar pneumonia.     Review of systems:    In addition to the HPI above,  No Fever, subjective chills No Headache, No changes with Vision or hearing, left eye and left ear pain No problems swallowing food or Liquids, No Chest pain, cough with whitish phlegm, no shortness of breath No Abdominal pain, no nausea, vomiting +, Bowel movements are regular, No Blood in stool or Urine,  No dysuria, No new skin rashes or bruises, No new joints pains-aches,  Weakness +, tingling, numbness in any extremity, No recent weight gain or loss, No polyuria, polydypsia or polyphagia, No significant Mental Stressors.    With Past History of the following :    Past Medical History:  Diagnosis Date  . Anxiety    "not a problem for a long time" (11/27/2016)  . Arthritis    "  wrists, knees, shoulders, back" (11/27/2016)  . Chronic lower back pain    "5th disc is protruding" (11/27/2016)  . Essential tremor   . GERD (gastroesophageal reflux disease)   . Glaucoma   . Heart murmur    dx'd in [employer] in the 1960s; haven't been seen for it since "(11/27/2016)  . Hepatitis ~ 1958   "jaundice kind"  . HOH (hard of hearing)   . Hyperlipemia   . Migraine    "none since my neck OR" (11/27/2016)  . Seasonal allergies   . TIA (transient ischemic attack) 04/2015  . Wears glasses   . Wears hearing aid       Past Surgical History:  Procedure Laterality Date  . ANTERIOR CERVICAL DECOMP/DISCECTOMY FUSION  2007  . APPENDECTOMY    . BACK SURGERY    . CARPAL TUNNEL RELEASE Right 10/12/2013   Procedure: RIGHT CARPAL TUNNEL RELEASE;  Surgeon: Gary R Kuzma, MD;  Location: [facility];  Service: Orthopedics;  Laterality: Right;  . CATARACT EXTRACTION W/ INTRAOCULAR LENS  IMPLANT, BILATERAL Bilateral   . COLONOSCOPY    . HEMORRHOID SURGERY    . KNEE  ARTHROSCOPY Left 1970s  . NASAL SEPTOPLASTY W/ TURBINOPLASTY Bilateral 02/15/2013   Procedure: NASAL SEPTOPLASTY WITH BILATER TURBINATE REDUCTION;  Surgeon: Sui W Teoh, MD;  Location: [facility];  Service: ENT;  Laterality: Bilateral;  . TONSILLECTOMY    . TRIGGER FINGER RELEASE  06/08/2012   Procedure: RELEASE TRIGGER FINGER/A-1 PULLEY;  Surgeon: Gary R Kuzma, MD;  Location: [facility];  Service: Orthopedics;  Laterality: Left;  Release A-1 Pulley Left Long Finger  . TRIGGER FINGER RELEASE  07/14/2012   Procedure: RELEASE TRIGGER FINGER/A-1 PULLEY;  Surgeon: Gary R Kuzma, MD;  Location: [facility];  Service: Orthopedics;  Laterality: Right;  . TRIGGER FINGER RELEASE Right 10/12/2013   Procedure: RELEASE TRIGGER FINGER/A-1 PULLEY RIGHT MIDDLE FINGER;  Surgeon: Gary R Kuzma, MD;  Location: [facility];  Service: Orthopedics;  Laterality: Right;  . UPPER GASTROINTESTINAL ENDOSCOPY        Social History:     Social History   Tobacco Use  . Smoking status: Former Smoker    Packs/day: 2.00    Years: 30.00    Pack years: 60.00    Types: Cigarettes    Last attempt to quit: 06/03/1984    Years since quitting: 34.1  . Smokeless tobacco: Never Used  . [facility] comment[facility]; also noted 2018 on Dr. Nishan's note   Substance Use Topics  . Alcohol use: No    Comment: 11/27/2016 "quit in 1985"     Lives -home with wife Mobility -independent     Family History :     Family History  Problem Relation Age of Onset  . Dementia Father   . Heart failure Father   . Glaucoma Mother   . Colon cancer Neg Hx       Home Medications:   Prior to Admission medications   Medication Sig Start Date End Date Taking? Authorizing Provider  aspirin EC 81 MG tablet Take 81 mg by mouth daily.   Yes [provider]  atenolol (TENORMIN) 25 MG tablet TAKE (1/2) TABLET DAILY. 06/11/18  Yes Nafziger, Cory, NP  Cholecalciferol  (VITAMIN D3) 25 MCG (1000 UT) CAPS Take 1 capsule by mouth daily.   Yes [provider]  Homeopathic Products (LEG CRAMPS PM SL) Place 3 tablets under the tongue   every evening.   Yes [provider]  ipratropium (ATROVENT) 0.03 % nasal spray Place 2 sprays into both nostrils 3 (three) times daily as needed for rhinitis.   Yes [provider]  latanoprost (XALATAN) 0.005 % ophthalmic solution Place 1 drop into both eyes at bedtime.    Yes [provider]  levocetirizine (XYZAL) 5 MG tablet Take 5 mg by mouth every evening.   Yes [provider]  omeprazole (PRILOSEC) 20 MG capsule TAKE (1) CAPSULE DAILY. 04/30/18  Yes Nafziger, Cory, NP  simvastatin (ZOCOR) 10 MG tablet Take 1 tablet (10 mg total) by mouth at bedtime. Patient taking differently: Take 10 mg by mouth every other day.  04/13/18  Yes Nafziger, Cory, NP     Allergies:     Allergies  Allergen Reactions  . Statins Other (See Comments)    Muscle cramps      Physical Exam:   Vitals  Blood pressure 113/66, pulse 87, temperature 100.2 F (37.9 C), temperature source Rectal, resp. rate 20, SpO2 95 %.   General: Elderly male lying in bed in no acute distress HEENT: Pupils reactive bilaterally, EOMI, normal ear exam, no sinus tenderness, no pallor, no icterus, dry mucosa, supple neck, no cervical lymphadenopathy Chest: Clear to auscultation bilaterally, no rhonchi wheeze or crackles CVs: Normal S1-S2, no murmurs rub or gallop GI: Soft, nondistended, nontender, bowel sounds present Musculoskeletal: Warm, no edema, normal skin CNS: Alert and oriented x2-3 with occasional confusion, nonfocal   Data Review:    CBC Recent Labs  Lab 08/04/18 0652  WBC 30.7*  HGB 14.1  HCT 42.8  PLT 224  MCV 93.7  MCH 30.9  MCHC 32.9  RDW 13.1   ------------------------------------------------------------------------------------------------------------------  Chemistries  Recent Labs  Lab  08/04/18 0652  NA 136  K 4.3  CL 102  CO2 23  GLUCOSE 119*  BUN 27*  CREATININE 1.97*  CALCIUM 8.7*  AST 16  ALT 13  ALKPHOS 44  BILITOT 1.1   ------------------------------------------------------------------------------------------------------------------ CrCl cannot be calculated (Unknown ideal weight.). ------------------------------------------------------------------------------------------------------------------ No results for input(s): TSH, T4TOTAL, T3FREE, THYROIDAB in the last 72 hours.  Invalid input(s): FREET3  Coagulation profile No results for input(s): INR, PROTIME in the last 168 hours. ------------------------------------------------------------------------------------------------------------------- No results for input(s): DDIMER in the last 72 hours. -------------------------------------------------------------------------------------------------------------------  Cardiac Enzymes No results for input(s): CKMB, TROPONINI, MYOGLOBIN in the last 168 hours.  Invalid input(s): CK ------------------------------------------------------------------------------------------------------------------ No results found for: BNP   ---------------------------------------------------------------------------------------------------------------  Urinalysis    Component Value Date/Time   BILIRUBINUR n 09/16/2016 1048   PROTEINUR trace 09/16/2016 1048   UROBILINOGEN 0.2 09/16/2016 1048   NITRITE n 09/16/2016 1048   LEUKOCYTESUR Negative 09/16/2016 1048    ----------------------------------------------------------------------------------------------------------------   Imaging Results:    Dg Chest 2 View  Result Date: 08/04/2018 CLINICAL DATA:  Cough. EXAM: CHEST - 2 VIEW COMPARISON:  Radiographs of November 27, 2016. FINDINGS: The heart size and mediastinal contours are within normal limits. No pneumothorax or pleural effusion is noted. Stable probable scarring is  seen throughout both lungs. Increased airspace opacity is noted in left upper lobe consistent with pneumonia. The visualized skeletal structures are unremarkable. IMPRESSION: Left upper lobe pneumonia. Followup PA and lateral chest X-ray is recommended in 3-4 weeks following trial of antibiotic therapy to ensure resolution and exclude underlying malignancy. Electronically Signed   By: James  Green Jr, M.D.   On: 08/04/2018 07:43   Ct Head Wo Contrast  Result Date: 08/04/2018 CLINICAL DATA:  Dizziness, hallucinations. EXAM:   CT HEAD WITHOUT CONTRAST TECHNIQUE: Contiguous axial images were obtained from the base of the skull through the vertex without intravenous contrast. COMPARISON:  MRI of May 16, 2015.  CT scan of April 24, 2015. FINDINGS: Brain: Mild diffuse cortical atrophy is noted. Mild chronic ischemic white matter disease is noted. Stable probable calcified meningioma seen in left posterior fossa. No acute hemorrhage or infarction is noted. No mass effect or midline shift is noted. Ventricular size is within normal limits. Vascular: No hyperdense vessel or unexpected calcification. Skull: Normal. Negative for fracture or focal lesion. Sinuses/Orbits: No acute finding. Other: None. IMPRESSION: Mild diffuse cortical atrophy. Mild chronic ischemic white matter disease. Stable probable calcified meningioma seen in left posterior fossa. No acute intracranial abnormality seen. Electronically Signed   By: James  Green Jr, M.D.   On: 08/04/2018 07:49    My personal review of EKG: Pending   Assessment & Plan:    Principal Problem:   Sepsis due to pneumonia (HCC) Admit to MedSurg.  Received 2 L IV normal saline bolus in the ED.  Maintenance fluids (normal saline) ordered.  Empiric IV Rocephin and azithromycin.  Blood cultures ordered.  Check sputum culture and flu PCR. Supportive care with Tylenol and antitussives. Blood pressure started to improve on IV fluids.  Repeat WBC in a.m.  Active  Problems: Left lobar pneumonia, unspecified organism (HCC) As above.  Needs follow-up chest x-ray in 4-6 weeks as outpatient to ensure resolution.  Toxic metabolic encephalopathy Secondary to pneumonia/early sepsis and dehydration.  Mental status clearing in the ED but still not back at baseline with some confusion.. Head CT unremarkable and ammonia normal.  Not on narcotics or benzos at home.  Monitor for now  Acute kidney injury (HCC) Likely prerenal secondary to dehydration and sepsis.  Monitor with IV fluids.  Avoid nephrotoxins.  Check UA and urine lites.  Essential hypertension Hold home medication as patient was hypotensive on presentation.  GERD Continue PPI  Dyslipidemia Continue statin      DVT Prophylaxis: Subcu heparin  AM Labs Ordered, also please review Full Orders  Family Communication: Admission, patients condition and plan of care including tests being ordered have been discussed with the patient and his wife at bedside  CODE STATUS: Full code  Likely DC to home in 2-3 days  Condition: Fair  Consults called: None  Admission status: Inpatient  Patient presenting with Sirs/early sepsis secondary to lobar pneumonia, acute kidney injury and associated toxic metabolic encephalopathy.  Patient needs to be monitored with IV fluids, IV antibiotics and close monitoring of his encephalopathy which requires him to be monitored in an inpatient setting for at least >2 midnights   Time spent in minutes : 70     M.D on 08/04/2018 at 9:48 AM  Between 7am to 7pm - Pager - [phone]. After 7pm go to [email or web address] - password [email or web address]  [facility] - Office  [phone]     

## 2018-08-04 NOTE — ED Notes (Signed)
Bed: WA01 Expected date:  Expected time:  Means of arrival:  Comments: 83 yr old blurred vision, hx TIA

## 2018-08-04 NOTE — ED Provider Notes (Addendum)
[facility] DEPT Provider Note   CSN: [id number] Arrival date & time: 08/04/18  2778     History   Chief Complaint Chief Complaint  Patient presents with  . Altered Mental Status    HPI Jason Farmer is a 83 y.o. male.  The history is provided by the patient, the spouse and medical records. No language interpreter was used.  Altered Mental Status       83 year old male with history of hepatitis, TIA, anxiety, chronic lower back pain brought here via EMS from home for evaluation of altered mental status.  History obtained through patient and through wife who is at bedside.  Per wife, since yesterday, patient has been complaining of headache, left ear pain, left shoulder and left hip pain with some congestion and occasional cough.  Symptoms that is most concerning was his confusion throughout the night in which he sees figures that isn't there, "talking out of his head".  This is unusual for him.  He has left shoulder and left hip pain is not new.  He has mild cold symptoms is reminiscent of recent sick contacts are most of the family is sick with a cold.  Cough is minimal and nonproductive.  No urinary symptoms.  No pain to chest, trouble breathing, abdominal pain, nausea vomiting or diarrhea.  No recent medication changes.  He denies alcohol or drug use.  Denies any depression.  Headache is throbbing primarily left side.  Headache is mild.  Past Medical History:  Diagnosis Date  . Anxiety    "not a problem for a long time" (11/27/2016)  . Arthritis    "wrists, knees, shoulders, back" (11/27/2016)  . Chronic lower back pain    "5th disc is protruding" (11/27/2016)  . Essential tremor   . GERD (gastroesophageal reflux disease)   . Glaucoma   . Heart murmur    dx'd in [employer] in the 1960s; haven't been seen for it since "(11/27/2016)  . Hepatitis ~ 1958   "jaundice kind"  . HOH (hard of hearing)   . Hyperlipemia   . Migraine    "none since my neck OR"  (11/27/2016)  . Seasonal allergies   . TIA (transient ischemic attack) 04/2015  . Wears glasses   . Wears hearing aid     Patient Active Problem List   Diagnosis Date Noted  . Chest pain, rule out acute myocardial infarction 11/27/2016  . Essential hypertension, benign 12/07/2014  . Muscle cramps 03/14/2014  . Allergic rhinitis, seasonal 04/06/2012  . PERSONAL HX COLONIC POLYPS 08/06/2010  . GERD 11/05/2009  . RESTLESS LEG SYNDROME 10/04/2009  . Hyperlipidemia 08/23/2009  . ANXIETY DISORDER, GENERALIZED 08/23/2009  . TREMOR, ESSENTIAL 08/23/2009  . 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 ANTERIOR CERVICAL DECOMP/DISCECTOMY FUSION  2007  . APPENDECTOMY    . BACK SURGERY    . CARPAL TUNNEL RELEASE Right 10/12/2013   Procedure: RIGHT CARPAL TUNNEL RELEASE;  Surgeon: Wynonia Sours, MD;  Location: [facility];  Service: Orthopedics;  Laterality: Right;  . CATARACT EXTRACTION W/ INTRAOCULAR LENS  IMPLANT, BILATERAL Bilateral   . COLONOSCOPY    . HEMORRHOID SURGERY    . KNEE ARTHROSCOPY Left 1970s  . NASAL SEPTOPLASTY W/ TURBINOPLASTY Bilateral 02/15/2013   Procedure: NASAL SEPTOPLASTY WITH BILATER TURBINATE REDUCTION;  Surgeon: Ascencion Dike, MD;  Location: [facility];  Service: ENT;  Laterality: Bilateral;  . TONSILLECTOMY    .  TRIGGER FINGER RELEASE  06/08/2012   Procedure: RELEASE TRIGGER FINGER/A-1 PULLEY;  Surgeon: Wynonia Sours, MD;  Location: [facility];  Service: Orthopedics;  Laterality: Left;  Release A-1 Pulley Left Long Finger  . TRIGGER FINGER RELEASE  07/14/2012   Procedure: RELEASE TRIGGER FINGER/A-1 PULLEY;  Surgeon: Wynonia Sours, MD;  Location: [facility];  Service: Orthopedics;  Laterality: Right;  . TRIGGER FINGER RELEASE Right 10/12/2013   Procedure: RELEASE TRIGGER FINGER/A-1 PULLEY RIGHT MIDDLE FINGER;  Surgeon: Wynonia Sours, MD;  Location: [facility];  Service: Orthopedics;  Laterality: Right;  . UPPER GASTROINTESTINAL ENDOSCOPY          Home Medications    Prior to Admission medications   Medication Sig Start Date End Date Taking? Authorizing Provider  aspirin EC 81 MG tablet Take 81 mg by mouth daily.    [provider]  atenolol (TENORMIN) 25 MG tablet TAKE (1/2) TABLET DAILY. 06/11/18   Nafziger, Tommi Rumps, NP  fexofenadine (ALLEGRA) 180 MG tablet Take 180 mg by mouth daily.    [provider]  latanoprost (XALATAN) 0.005 % ophthalmic solution Place 1 drop into both eyes at bedtime.     [provider]  omeprazole (PRILOSEC) 20 MG capsule TAKE (1) CAPSULE DAILY. 04/30/18   Nafziger, Tommi Rumps, NP  simvastatin (ZOCOR) 10 MG tablet Take 1 tablet (10 mg total) by mouth at bedtime. 04/13/18   Dorothyann Peng, NP    Family History Family History  Problem Relation Age of Onset  . Dementia Father   . Heart failure Father   . Glaucoma Mother   . Colon cancer Neg Hx     Social History Social History   Tobacco Use  . Smoking status: Former Smoker    Packs/day: 2.00    Years: 30.00    Pack years: 60.00    Types: Cigarettes    Last attempt to quit: 06/03/1984    Years since quitting: 34.1  . Smokeless tobacco: Never Used  . Tobacco comment: had AAA at the [facility]; also noted 2018 on Dr. Kyla Balzarine note   Substance Use Topics  . Alcohol use: No    Comment: 11/27/2016 "quit in 1985"  . Drug use: No     Allergies   Statins   Review of Systems Review of Systems  All other systems reviewed and are negative.    Physical Exam Updated Vital Signs BP (!) 96/49 (BP Location: Right Arm)   Pulse 98   Temp 98.8 F (37.1 C) (Oral)   Resp 18   SpO2 97%   Physical Exam  Constitutional: He is oriented to person, place, and time. He appears well-developed and well-nourished. No distress.  HENT:  Head: Normocephalic and atraumatic.  Right Ear: External ear normal.  Left Ear: External ear normal.    Nose: Nose normal.  Mouth/Throat: Oropharynx is clear and moist.  Eyes: Pupils are equal, round, and reactive to light. Conjunctivae and EOM are normal.  Neck: Normal range of motion. Neck supple.  No nuchal rigidity  Cardiovascular: Normal rate and regular rhythm.  Pulmonary/Chest: Effort normal and breath sounds normal.  Abdominal: Soft. Bowel sounds are normal. He exhibits no distension. There is no tenderness.  Musculoskeletal: He exhibits tenderness (Mild tenderness about the left shoulder and left hip with full range of motion.).  Neurological: He is alert and oriented to person, place, and time. He has normal strength. No cranial nerve deficit or sensory deficit. GCS eye subscore is  4. GCS verbal subscore is 5. GCS motor subscore is 6.  Skin: No rash noted.  Psychiatric: He has a normal mood and affect.  Nursing note and vitals reviewed.    ED Treatments / Results  Labs (all labs ordered are listed, but only abnormal results are displayed) Labs Reviewed  COMPREHENSIVE METABOLIC PANEL - Abnormal; Notable for the following components:      Result Value   Glucose, Bld 119 (*)    BUN 27 (*)    Creatinine, Ser 1.97 (*)    Calcium 8.7 (*)    GFR calc non Af Amer 31 (*)    GFR calc Af Amer 36 (*)    All other components within normal limits  CBC - Abnormal; Notable for the following components:   WBC 30.7 (*)    All other components within normal limits  CULTURE, BLOOD (ROUTINE X 2)  CULTURE, BLOOD (ROUTINE X 2)  AMMONIA  INFLUENZA PANEL BY PCR (TYPE A & B)  CBG MONITORING, ED  I-STAT CG4 LACTIC ACID, ED  I-STAT CG4 LACTIC ACID, ED    EKG None  Radiology Dg Chest 2 View  Result Date: 08/04/2018 CLINICAL DATA:  Cough. EXAM: CHEST - 2 VIEW COMPARISON:  Radiographs of November 27, 2016. FINDINGS: The heart size and mediastinal contours are within normal limits. No pneumothorax or pleural effusion is noted. Stable probable scarring is seen throughout both lungs. Increased  airspace opacity is noted in left upper lobe consistent with pneumonia. The visualized skeletal structures are unremarkable. IMPRESSION: Left upper lobe pneumonia. Followup PA and lateral chest X-ray is recommended in 3-4 weeks following trial of antibiotic therapy to ensure resolution and exclude underlying malignancy. Electronically Signed   By: Marijo Conception, M.D.   On: 08/04/2018 07:43   Ct Head Wo Contrast  Result Date: 08/04/2018 CLINICAL DATA:  Dizziness, hallucinations. EXAM: CT HEAD WITHOUT CONTRAST TECHNIQUE: Contiguous axial images were obtained from the base of the skull through the vertex without intravenous contrast. COMPARISON:  MRI of May 16, 2015.  CT scan of April 24, 2015. FINDINGS: Brain: Mild diffuse cortical atrophy is noted. Mild chronic ischemic white matter disease is noted. Stable probable calcified meningioma seen in left posterior fossa. No acute hemorrhage or infarction is noted. No mass effect or midline shift is noted. Ventricular size is within normal limits. Vascular: No hyperdense vessel or unexpected calcification. Skull: Normal. Negative for fracture or focal lesion. Sinuses/Orbits: No acute finding. Other: None. IMPRESSION: Mild diffuse cortical atrophy. Mild chronic ischemic white matter disease. Stable probable calcified meningioma seen in left posterior fossa. No acute intracranial abnormality seen. Electronically Signed   By: Marijo Conception, M.D.   On: 08/04/2018 07:49    Procedures .Critical Care Performed by: Domenic Moras, PA-C Authorized by: Domenic Moras, PA-C   Critical care provider statement:    Critical care time (minutes):  45   Critical care was time spent personally by me on the following activities:  Discussions with consultants, evaluation of patient's response to treatment, examination of patient, ordering and performing treatments and interventions, ordering and review of laboratory studies, ordering and review of radiographic studies,  pulse oximetry, re-evaluation of patient's condition, obtaining history from patient or surrogate and review of old charts   (including critical care time)  Medications Ordered in ED Medications  sodium chloride 0.9 % bolus 1,000 mL (1,000 mLs Intravenous New Bag/Given 08/04/18 0841)    And  sodium chloride 0.9 % bolus 250 mL (has no administration  in time range)  cefTRIAXone (ROCEPHIN) 2 g in sodium chloride 0.9 % 100 mL IVPB (2 g Intravenous New Bag/Given 08/04/18 0841)  azithromycin (ZITHROMAX) 500 mg in sodium chloride 0.9 % 250 mL IVPB (has no administration in time range)  sodium chloride 0.9 % bolus 1,000 mL (1,000 mLs Intravenous New Bag/Given 08/04/18 0841)     Initial Impression / Assessment and Plan / ED Course  I have reviewed the triage vital signs and the nursing notes.  Pertinent labs & imaging results that were available during my care of the patient were reviewed by me and considered in my medical decision making (see chart for details).     BP 105/72   Pulse 94   Temp 100.2 F (37.9 C) (Rectal)   Resp 19   SpO2 94%    Final Clinical Impressions(s) / ED Diagnoses   Final diagnoses:  Community acquired pneumonia of left upper lobe of lung ([place])  Dehydration  Delirium    ED Discharge Orders    None     6:57 AM Patient here for new onset of confusion according to wife which started since yesterday.  He has some mild cold symptoms but no objective finding on exam.  He has some mild headache but no nuchal rigidity and has no focal neuro deficit on exam.  He is actually alert and oriented x4 upon my questioning.  Given remote hx of Hepatitis, will check labs including ammonia level.  Due to cold sxs, will check CXR.   7:52 AM Chest x-ray demonstrates left lower lobe pneumonia.  This is consistent with patient's left shoulder and left side pain as well as cough and confusion.  Patient has an elevated white count of 30.  Evidence of AKI with BUN 27, creatinine  1.97.  He has normal ammonia level.  Head CT scan without acute changes.  8:54 AM Rectal temp is 100.2, initial blood pressure is soft in the low 71I systolic.  In the setting of elevated white count of 30, code sepsis was initiated.  Initial lactic acid is less than 2.  Patient however is dehydrated therefore with I think is reasonable to give 30 mL/kg of normal saline IV fluid.  Patient was started on Rocephin and Zithromax as coverage for community-acquired pneumonia however I am suspicious that patient may have influenza.  Flu PCR sent.  Will consult for admission.  Care discussed with DR. Ralene Bathe. Low suspicion for ACS or PE.   9:15 AM Appreciate consultation from [place] DR. Dhungel who agrees to see pt in the ER and will admit for further management.  Pt and family agrees with plan.     Domenic Moras, PA-C 08/04/18 0916    Domenic Moras, PA-C 08/05/18 4580    Quintella Reichert, MD 08/05/18 1003

## 2018-08-05 ENCOUNTER — Inpatient Hospital Stay (HOSPITAL_COMMUNITY): Payer: Medicare Other

## 2018-08-05 DIAGNOSIS — N179 Acute kidney failure, unspecified: Secondary | ICD-10-CM

## 2018-08-05 DIAGNOSIS — J189 Pneumonia, unspecified organism: Secondary | ICD-10-CM

## 2018-08-05 DIAGNOSIS — A419 Sepsis, unspecified organism: Secondary | ICD-10-CM

## 2018-08-05 DIAGNOSIS — R7881 Bacteremia: Secondary | ICD-10-CM | POA: Diagnosis present

## 2018-08-05 DIAGNOSIS — J181 Lobar pneumonia, unspecified organism: Secondary | ICD-10-CM

## 2018-08-05 DIAGNOSIS — G92 Toxic encephalopathy: Secondary | ICD-10-CM

## 2018-08-05 LAB — BLOOD CULTURE ID PANEL (REFLEXED)

## 2018-08-05 LAB — BASIC METABOLIC PANEL
Anion gap: 10 (ref 5–15)
BUN: 28 mg/dL — ABNORMAL HIGH (ref 8–23)
CO2: 21 mmol/L — ABNORMAL LOW (ref 22–32)
Calcium: 8.7 mg/dL — ABNORMAL LOW (ref 8.9–10.3)
Chloride: 106 mmol/L (ref 98–111)
Creatinine, Ser: 1.39 mg/dL — ABNORMAL HIGH (ref 0.61–1.24)
GFR calc Af Amer: 55 mL/min — ABNORMAL LOW (ref >60)
GFR calc non Af Amer: 47 mL/min — ABNORMAL LOW (ref >60)
Glucose, Bld: 112 mg/dL — ABNORMAL HIGH (ref 70–99)
Potassium: 3.8 mmol/L (ref 3.5–5.1)
Sodium: 137 mmol/L (ref 135–145)

## 2018-08-05 LAB — CBC
HCT: 41.1 % (ref 39.0–52.0)
Hemoglobin: 13.4 g/dL (ref 13.0–17.0)
MCH: 30.2 pg (ref 26.0–34.0)
MCHC: 32.6 g/dL (ref 30.0–36.0)
MCV: 92.8 fL (ref 80.0–100.0)
Platelets: 268 10*3/uL (ref 150–400)
RBC: 4.43 MIL/uL (ref 4.22–5.81)
RDW: 12.9 % (ref 11.5–15.5)
WBC: 20.1 10*3/uL — ABNORMAL HIGH (ref 4.0–10.5)
nRBC: 0 % (ref 0.0–0.2)

## 2018-08-05 LAB — HIV ANTIBODY (ROUTINE TESTING W REFLEX): HIV Screen 4th Generation wRfx: NONREACTIVE

## 2018-08-05 MED ORDER — SODIUM CHLORIDE 0.9 % IV SOLN
2.0000 g | INTRAVENOUS | Status: DC
  Administered 2018-08-05 – 2018-08-08 (×4): 2 g via INTRAVENOUS
  Filled 2018-08-05 (×4): qty 2

## 2018-08-05 MED ORDER — HALOPERIDOL LACTATE 5 MG/ML IJ SOLN
2.5000 mg | Freq: Once | INTRAMUSCULAR | Status: AC
  Administered 2018-08-05: 2.5 mg via INTRAVENOUS
  Filled 2018-08-05: qty 1

## 2018-08-05 MED ORDER — LEVALBUTEROL HCL 0.63 MG/3ML IN NEBU
0.6300 mg | INHALATION_SOLUTION | Freq: Four times a day (QID) | RESPIRATORY_TRACT | Status: DC
  Administered 2018-08-05: 0.63 mg via RESPIRATORY_TRACT
  Filled 2018-08-05: qty 3

## 2018-08-05 MED ORDER — FLUTICASONE PROPIONATE 50 MCG/ACT NA SUSP
2.0000 | Freq: Every day | NASAL | Status: DC
  Administered 2018-08-06 – 2018-08-07 (×2): 2 via NASAL
  Filled 2018-08-05: qty 16

## 2018-08-05 MED ORDER — SODIUM CHLORIDE 0.9 % IV SOLN: INTRAVENOUS | Status: DC

## 2018-08-05 MED ORDER — IPRATROPIUM BROMIDE 0.02 % IN SOLN: 0.5000 mg | RESPIRATORY_TRACT | Status: DC | PRN

## 2018-08-05 MED ORDER — SODIUM CHLORIDE 0.9 % IV SOLN
INTRAVENOUS | Status: DC
  Administered 2018-08-05 – 2018-08-06 (×2): via INTRAVENOUS

## 2018-08-05 MED ORDER — IPRATROPIUM BROMIDE 0.02 % IN SOLN
0.5000 mg | Freq: Four times a day (QID) | RESPIRATORY_TRACT | Status: DC
  Administered 2018-08-05: 0.5 mg via RESPIRATORY_TRACT
  Filled 2018-08-05: qty 2.5

## 2018-08-05 MED ORDER — LEVALBUTEROL HCL 0.63 MG/3ML IN NEBU: 0.6300 mg | INHALATION_SOLUTION | RESPIRATORY_TRACT | Status: DC | PRN

## 2018-08-05 MED ORDER — IPRATROPIUM BROMIDE 0.02 % IN SOLN
0.5000 mg | Freq: Two times a day (BID) | RESPIRATORY_TRACT | Status: DC
  Administered 2018-08-05 – 2018-08-06 (×2): 0.5 mg via RESPIRATORY_TRACT
  Filled 2018-08-05 (×3): qty 2.5

## 2018-08-05 MED ORDER — GUAIFENESIN ER 600 MG PO TB12
1200.0000 mg | ORAL_TABLET | Freq: Two times a day (BID) | ORAL | Status: DC
  Administered 2018-08-05 – 2018-08-08 (×7): 1200 mg via ORAL
  Filled 2018-08-05 (×7): qty 2

## 2018-08-05 MED ORDER — LEVALBUTEROL HCL 1.25 MG/0.5ML IN NEBU
1.2500 mg | INHALATION_SOLUTION | Freq: Two times a day (BID) | RESPIRATORY_TRACT | Status: DC
  Administered 2018-08-05 – 2018-08-06 (×2): 1.25 mg via RESPIRATORY_TRACT
  Filled 2018-08-05 (×3): qty 0.5

## 2018-08-05 NOTE — Progress Notes (Signed)
PROGRESS NOTE    Jason Farmer  KWI:[id number] DOB: [birth year] DOA: 08/04/2018 PCP: Dorothyann Peng, NP    Brief Narrative:  HPI per Dr. Colman Cater Docken  is a 83 y.o. male, with history of hypertension, hyperlipidemia, GERD, restless leg syndrome who was brought to the ED by his wife with acute onset of confusion with visual and auditory hallucinations.  Associated symptoms include generalized weakness and unsteady gait.  Patient reports having chronic nonproductive cough but yesterday started having sudden pain in his left eye and left ear.  He also had an episode of nonbloody vomiting.  Denies back pain, dysuria or diarrhea.  Denies chest pain, shortness of breath, orthopnea, abdominal pain, palpitations, tingling or numbness of extremities.  Did not record temperature at home.  Denies any recent travel.  Wife reports having URI symptoms last week.  Denies any new medications.  Patient reports pain in his left shoulders which is chronic.  Vitals in the ED showed temperature of 100.2 F, respiratory rate of 21, blood pressure 96/49 mmHg, O2 sat >95% on room air.  Blood work showed WBC of 30.7K, hemoglobin of 14, BUN of 27 creatinine of 1.97 (baseline creatinine of 1), glucose of 119.  Lactic acid was normal.  Chest x-ray showed left lower lobe pneumonia.  Head CT was unremarkable for acute findings. Patient met criteria for early sepsis and sepsis pathway initiated.  Blood culture and flu PCR was sent.  Patient given empiric IV Rocephin and azithromycin along with 2 L normal saline bolus and blood pressure slowly improved. In the ED, he was confused and was not fully oriented but after receiving IV fluids mental status started improving to baseline. Hospitalist consulted for admission to early sepsis/Sirs secondary to left lobar pneumonia  Assessment & Plan:   Principal Problem:   Sepsis due to pneumonia [facility]) Active Problems:   Bacteremia   Hyperlipidemia   ANXIETY DISORDER,  GENERALIZED   RESTLESS LEG SYNDROME   GERD   Essential hypertension, benign   AKI (acute kidney injury) ([place])   Toxic metabolic encephalopathy   Lobar pneumonia, unspecified organism ([place])   Sepsis ([place])  #1 sepsis secondary to pneumonia and Streptococcus pneumonia bacteremia Patient presented with altered mental status, tachycardia, temperature of 100.2 with borderline blood pressure.  Chest x-ray concerning for left upper lobe pneumonia.  Urine strep pneumococcus antigen was positive.  Urine Legionella antigen pending.  Blood cultures positive for strep pneumonia which likely seeded from patient's lung infection.  Patient still hallucinating.  Blood pressure somewhat borderline however improving.  Tachycardia slowly improving.  Continue empiric IV Rocephin and azithromycin.  Sensitivities pending.  Continue IV fluids.  Add Mucinex, Flonase, scheduled Atrovent and Xopenex nebs, Claritin, PPI.  Supportive care.  Patient will likely need at least 2 weeks of antibiotics.  Follow.  2.  Acute metabolic encephalopathy Patient with visual and auditory hallucinations ongoing per RN.  Patient also endorses visual hallucinations.  CT head negative.  Continue empiric IV antibiotics, supportive care.  Follow.  3.  Gastroesophageal reflux disease PPI.  4.  Acute renal failure Likely secondary to a prerenal azotemia.  Patient noted to have borderline blood pressure on admission.  Renal function trending down with hydration.  Follow.  5.  Hypertension Patient noted to be hypotensive on admission and as such antihypertensive medications on hold.  Continue IV fluids.  Follow.  6.  Hyperlipidemia Continue statin.   DVT prophylaxis: Heparin Code Status: Full Family Communication: Updated wife at bedside. Disposition Plan:  Likely home when clinically improved and mentation back to baseline.   Consultants:   None  Procedures:   CT head without contrast 08/04/2018  Chest x-ray  08/04/2018  Antimicrobials:   IV azithromycin 08/04/2018>>>> oral azithromycin 08/05/2018  IV Rocephin 08/04/2018   Subjective: Patient laying in bed easily arousable.  Per RN and per patient he is having some visual hallucinations noticed a cat in the room.  Per RN patient also noted a family that did not exist in the bathroom.  Patient told wife he felt I was his old neighbor.  Per RN patient dismantled light bulb in the room.  Patient denies any significant shortness of breath.  Patient denies any chest pain.  Patient states weakness slightly improved from admission.  Wife at bedside.  Objective: Vitals:   08/04/18 1444 08/04/18 2257 08/05/18 0446 08/05/18 0859  BP: 107/62 114/76 109/72   Pulse: (!) 105 80 100   Resp: 17 16 20    Temp: 98.2 F (36.8 C) 98.1 F (36.7 C) 98 F (36.7 C)   TempSrc: Oral Oral Oral   SpO2: 95% 96% 94% 96%    Intake/Output Summary (Last 24 hours) at 08/05/2018 1049 Last data filed at 08/05/2018 0524 Gross per 24 hour  Intake 611.79 ml  Output -  Net 611.79 ml   There were no vitals filed for this visit.  Examination:  General exam: Appears calm and comfortable.  Dry mucous membranes. Respiratory system: Some coarse breath sounds on the left.  No wheezing, no crackles. Respiratory effort normal. Cardiovascular system: S1 & S2 heard, RRR. No JVD, murmurs, rubs, gallops or clicks. No pedal edema. Gastrointestinal system: Abdomen is nondistended, soft and nontender. No organomegaly or masses felt. Normal bowel sounds heard. Central nervous system: Alert and oriented. No focal neurological deficits. Extremities: Symmetric 5 x 5 power. Skin: No rashes, lesions or ulcers Psychiatry: Judgement and insight appear normal. Mood & affect appropriate.     Data Reviewed: I have personally reviewed following labs and imaging studies  CBC: Recent Labs  Lab 08/04/18 0652 08/05/18 0341  WBC 30.7* 20.1*  HGB 14.1 13.4  HCT 42.8 41.1  MCV 93.7 92.8   PLT 224 122   Basic Metabolic Panel: Recent Labs  Lab 08/04/18 0652 08/05/18 0341  NA 136 137  K 4.3 3.8  CL 102 106  CO2 23 21*  GLUCOSE 119* 112*  BUN 27* 28*  CREATININE 1.97* 1.39*  CALCIUM 8.7* 8.7*   GFR: CrCl cannot be calculated (Unknown ideal weight.). Liver Function Tests: Recent Labs  Lab 08/04/18 0652  AST 16  ALT 13  ALKPHOS 44  BILITOT 1.1  PROT 6.5  ALBUMIN 3.5   No results for input(s): LIPASE, AMYLASE in the last 168 hours. Recent Labs  Lab 08/04/18 0653  AMMONIA 17   Coagulation Profile: No results for input(s): INR, PROTIME in the last 168 hours. Cardiac Enzymes: No results for input(s): CKTOTAL, CKMB, CKMBINDEX, TROPONINI in the last 168 hours. BNP (last 3 results) No results for input(s): PROBNP in the last 8760 hours. HbA1C: No results for input(s): HGBA1C in the last 72 hours. CBG: No results for input(s): GLUCAP in the last 168 hours. Lipid Profile: No results for input(s): CHOL, HDL, LDLCALC, TRIG, CHOLHDL, LDLDIRECT in the last 72 hours. Thyroid Function Tests: No results for input(s): TSH, T4TOTAL, FREET4, T3FREE, THYROIDAB in the last 72 hours. Anemia Panel: No results for input(s): VITAMINB12, FOLATE, FERRITIN, TIBC, IRON, RETICCTPCT in the last 72 hours. Sepsis Labs: Recent  Labs  Lab 08/04/18 0850  LATICACIDVEN 1.51    Recent Results (from the past 240 hour(s))  Blood culture (routine x 2)     Status: Abnormal (Preliminary result)   Collection Time: 08/04/18  8:39 AM  Result Value Ref Range Status   Specimen Description   Final    BLOOD RIGHT ANTECUBITAL Performed at [facility] [address] [place], [place] [address]    Special Requests   Final    BOTTLES DRAWN AEROBIC AND ANAEROBIC Blood Culture adequate volume Performed at [facility] [address] [place], [place] [address]    Culture  Setup Time   Final    GRAM POSITIVE COCCI AEROBIC BOTTLE ONLY Organism ID to  follow CRITICAL RESULT CALLED TO, READ BACK BY AND VERIFIED WITH: L POINDEXTER PHARMD 0201 08/05/18 A BROWNING    Culture (A)  Final    STREPTOCOCCUS PNEUMONIAE CULTURE REINCUBATED FOR BETTER GROWTH Performed at [facility], [place] [address], [place], [place] [address]    Report Status PENDING  Incomplete  Blood Culture ID Panel (Reflexed)     Status: Abnormal   Collection Time: 08/04/18  8:39 AM  Result Value Ref Range Status   Enterococcus species NOT DETECTED NOT DETECTED Final   Listeria monocytogenes NOT DETECTED NOT DETECTED Final   Staphylococcus species NOT DETECTED NOT DETECTED Final   Staphylococcus aureus (BCID) NOT DETECTED NOT DETECTED Final   Streptococcus species DETECTED (A) NOT DETECTED Final    Comment: CRITICAL RESULT CALLED TO, READ BACK BY AND VERIFIED WITH: L POINDEXTER PHARMD 0201 08/05/18 A BROWNING    Streptococcus agalactiae NOT DETECTED NOT DETECTED Final   Streptococcus pneumoniae DETECTED (A) NOT DETECTED Final    Comment: CRITICAL RESULT CALLED TO, READ BACK BY AND VERIFIED WITH: L POINDEXTER PHARMD 0201 08/05/18 A BROWNING    Streptococcus pyogenes NOT DETECTED NOT DETECTED Final   Acinetobacter baumannii NOT DETECTED NOT DETECTED Final   Enterobacteriaceae species NOT DETECTED NOT DETECTED Final   Enterobacter cloacae complex NOT DETECTED NOT DETECTED Final   Escherichia coli NOT DETECTED NOT DETECTED Final   Klebsiella oxytoca NOT DETECTED NOT DETECTED Final   Klebsiella pneumoniae NOT DETECTED NOT DETECTED Final   Proteus species NOT DETECTED NOT DETECTED Final   Serratia marcescens NOT DETECTED NOT DETECTED Final   Haemophilus influenzae NOT DETECTED NOT DETECTED Final   Neisseria meningitidis NOT DETECTED NOT DETECTED Final   Pseudomonas aeruginosa NOT DETECTED NOT DETECTED Final   Candida albicans NOT DETECTED NOT DETECTED Final   Candida glabrata NOT DETECTED NOT DETECTED Final   Candida krusei NOT DETECTED NOT DETECTED Final    Candida parapsilosis NOT DETECTED NOT DETECTED Final   Candida tropicalis NOT DETECTED NOT DETECTED Final    Comment: Performed at [facility], [place] [address], [place], [place] [address]  Blood culture (routine x 2)     Status: None (Preliminary result)   Collection Time: 08/04/18  9:00 AM  Result Value Ref Range Status   Specimen Description   Final    BLOOD LEFT ANTECUBITAL Performed at [facility] [address] [place],  [address]    Special Requests   Final    BOTTLES DRAWN AEROBIC AND ANAEROBIC Blood Culture adequate volume Performed at [facility] [address] [place],  [address]    Culture  Setup Time   Final    GRAM POSITIVE COCCI AEROBIC BOTTLE ONLY CRITICAL VALUE NOTED.  VALUE IS CONSISTENT WITH PREVIOUSLY REPORTED AND CALLED VALUE. Performed at [facility], [place] [address], [place], [place] [address]    Culture PENDING  Incomplete   Report Status PENDING  Incomplete         Radiology Studies: Dg Chest 2 View  Result Date: 08/04/2018 CLINICAL DATA:  Cough. EXAM: CHEST - 2 VIEW COMPARISON:  Radiographs of November 27, 2016. FINDINGS: The heart size and mediastinal contours are within normal limits. No pneumothorax or pleural effusion is noted. Stable probable scarring is seen throughout both lungs. Increased airspace opacity is noted in left upper lobe consistent with pneumonia. The visualized skeletal structures are unremarkable. IMPRESSION: Left upper lobe pneumonia. Followup PA and lateral chest X-ray is recommended in 3-4 weeks following trial of antibiotic therapy to ensure resolution and exclude underlying malignancy. Electronically Signed   By: Marijo Conception, M.D.   On: 08/04/2018 07:43   Ct Head Wo Contrast  Result Date: 08/04/2018 CLINICAL DATA:  Dizziness, hallucinations. EXAM: CT HEAD WITHOUT CONTRAST TECHNIQUE: Contiguous axial images were obtained from the base of the skull through the  vertex without intravenous contrast. COMPARISON:  MRI of May 16, 2015.  CT scan of April 24, 2015. FINDINGS: Brain: Mild diffuse cortical atrophy is noted. Mild chronic ischemic white matter disease is noted. Stable probable calcified meningioma seen in left posterior fossa. No acute hemorrhage or infarction is noted. No mass effect or midline shift is noted. Ventricular size is within normal limits. Vascular: No hyperdense vessel or unexpected calcification. Skull: Normal. Negative for fracture or focal lesion. Sinuses/Orbits: No acute finding. Other: None. IMPRESSION: Mild diffuse cortical atrophy. Mild chronic ischemic white matter disease. Stable probable calcified meningioma seen in left posterior fossa. No acute intracranial abnormality seen. Electronically Signed   By: Marijo Conception, M.D.   On: 08/04/2018 07:49        Scheduled Meds: . aspirin EC  81 mg Oral Daily  . azithromycin  500 mg Oral Q24H  . cholecalciferol  1,000 Units Oral Daily  . fluticasone  2 spray Each Nare Daily  . guaiFENesin  1,200 mg Oral BID  . heparin  5,000 Units Subcutaneous Q8H  . ipratropium  0.5 mg Nebulization BID  . latanoprost  1 drop Both Eyes QHS  . levalbuterol  1.25 mg Nebulization BID  . loratadine  10 mg Oral q1800  . pantoprazole  40 mg Oral Daily  . simvastatin  10 mg Oral QODAY   Continuous Infusions: . sodium chloride    . cefTRIAXone (ROCEPHIN)  IV 2 g (08/05/18 0433)     LOS: 1 day    Time spent: 40 minutes    Irine Seal, MD [facility] Pager [phone]  If 7PM-7AM, please contact night-coverage [email or web address] Password [facility] 08/05/2018, 10:49 AM

## 2018-08-05 NOTE — Progress Notes (Signed)
PHARMACY - PHYSICIAN COMMUNICATION CRITICAL VALUE ALERT - BLOOD CULTURE IDENTIFICATION (BCID)  Results for orders placed or performed during the hospital encounter of 08/04/18  Blood Culture ID Panel (Reflexed) (Collected: 08/04/2018  8:39 AM)  Result Value Ref Range   Enterococcus species NOT DETECTED NOT DETECTED   Listeria monocytogenes NOT DETECTED NOT DETECTED   Staphylococcus species NOT DETECTED NOT DETECTED   Staphylococcus aureus (BCID) NOT DETECTED NOT DETECTED   Streptococcus species DETECTED (A) NOT DETECTED   Streptococcus agalactiae NOT DETECTED NOT DETECTED   Streptococcus pneumoniae DETECTED (A) NOT DETECTED   Streptococcus pyogenes NOT DETECTED NOT DETECTED   Acinetobacter baumannii NOT DETECTED NOT DETECTED   Enterobacteriaceae species NOT DETECTED NOT DETECTED   Enterobacter cloacae complex NOT DETECTED NOT DETECTED   Escherichia coli NOT DETECTED NOT DETECTED   Klebsiella oxytoca NOT DETECTED NOT DETECTED   Klebsiella pneumoniae NOT DETECTED NOT DETECTED   Proteus species NOT DETECTED NOT DETECTED   Serratia marcescens NOT DETECTED NOT DETECTED   Haemophilus influenzae NOT DETECTED NOT DETECTED   Neisseria meningitidis NOT DETECTED NOT DETECTED   Pseudomonas aeruginosa NOT DETECTED NOT DETECTED   Candida albicans NOT DETECTED NOT DETECTED   Candida glabrata NOT DETECTED NOT DETECTED   Candida krusei NOT DETECTED NOT DETECTED   Candida parapsilosis NOT DETECTED NOT DETECTED   Candida tropicalis NOT DETECTED NOT DETECTED    Name of physician (or Provider) Contacted: Lamar Blinks, NP  Current antibiotics:  Ceftriaxone 1gm IV q24h and Azithromycin 500mg  IV q24h  Changes to prescribed antibiotics required: Increase Ceftriaxone to 2gm IV q24h for treatment of Strep. pneumoniae  Everette Rank, PharmD 08/05/2018  3:22 AM

## 2018-08-06 DIAGNOSIS — Z87891 Personal history of nicotine dependence: Secondary | ICD-10-CM

## 2018-08-06 DIAGNOSIS — I10 Essential (primary) hypertension: Secondary | ICD-10-CM

## 2018-08-06 DIAGNOSIS — R197 Diarrhea, unspecified: Secondary | ICD-10-CM

## 2018-08-06 DIAGNOSIS — J13 Pneumonia due to Streptococcus pneumoniae: Secondary | ICD-10-CM

## 2018-08-06 DIAGNOSIS — R7881 Bacteremia: Secondary | ICD-10-CM

## 2018-08-06 DIAGNOSIS — E785 Hyperlipidemia, unspecified: Secondary | ICD-10-CM

## 2018-08-06 DIAGNOSIS — Z888 Allergy status to other drugs, medicaments and biological substances status: Secondary | ICD-10-CM

## 2018-08-06 DIAGNOSIS — K219 Gastro-esophageal reflux disease without esophagitis: Secondary | ICD-10-CM

## 2018-08-06 DIAGNOSIS — G2581 Restless legs syndrome: Secondary | ICD-10-CM

## 2018-08-06 DIAGNOSIS — B953 Streptococcus pneumoniae as the cause of diseases classified elsewhere: Secondary | ICD-10-CM

## 2018-08-06 LAB — BASIC METABOLIC PANEL
Anion gap: 9 (ref 5–15)
BUN: 20 mg/dL (ref 8–23)
CO2: 21 mmol/L — ABNORMAL LOW (ref 22–32)
Calcium: 8.2 mg/dL — ABNORMAL LOW (ref 8.9–10.3)
Chloride: 109 mmol/L (ref 98–111)
Creatinine, Ser: 1.21 mg/dL (ref 0.61–1.24)
GFR calc Af Amer: >60 mL/min (ref >60)
GFR calc non Af Amer: 56 mL/min — ABNORMAL LOW (ref >60)
Glucose, Bld: 97 mg/dL (ref 70–99)
Potassium: 3.9 mmol/L (ref 3.5–5.1)
Sodium: 139 mmol/L (ref 135–145)

## 2018-08-06 LAB — CBC WITH DIFFERENTIAL/PLATELET
Abs Immature Granulocytes: 0.03 10*3/uL (ref 0.00–0.07)
Basophils Absolute: 0 10*3/uL (ref 0.0–0.1)
Basophils Relative: 0 %
Eosinophils Absolute: 0.1 10*3/uL (ref 0.0–0.5)
Eosinophils Relative: 2 %
HCT: 36.5 % — ABNORMAL LOW (ref 39.0–52.0)
Hemoglobin: 11.6 g/dL — ABNORMAL LOW (ref 13.0–17.0)
Immature Granulocytes: 0 %
Lymphocytes Relative: 25 %
Lymphs Abs: 1.9 10*3/uL (ref 0.7–4.0)
MCH: 29.7 pg (ref 26.0–34.0)
MCHC: 31.8 g/dL (ref 30.0–36.0)
MCV: 93.4 fL (ref 80.0–100.0)
Monocytes Absolute: 0.5 10*3/uL (ref 0.1–1.0)
Monocytes Relative: 7 %
Neutro Abs: 4.9 10*3/uL (ref 1.7–7.7)
Neutrophils Relative %: 66 %
Platelets: 265 10*3/uL (ref 150–400)
RBC: 3.91 MIL/uL — ABNORMAL LOW (ref 4.22–5.81)
RDW: 13.1 % (ref 11.5–15.5)
WBC: 7.4 10*3/uL (ref 4.0–10.5)
nRBC: 0 % (ref 0.0–0.2)

## 2018-08-06 LAB — GLUCOSE, CAPILLARY: Glucose-Capillary: 87 mg/dL (ref 70–99)

## 2018-08-06 LAB — MAGNESIUM: Magnesium: 2.1 mg/dL (ref 1.7–2.4)

## 2018-08-06 MED ORDER — LOPERAMIDE HCL 2 MG PO CAPS
2.0000 mg | ORAL_CAPSULE | ORAL | Status: DC | PRN
  Administered 2018-08-07: 2 mg via ORAL
  Filled 2018-08-06 (×3): qty 1

## 2018-08-06 MED ORDER — LOPERAMIDE HCL 2 MG PO CAPS
2.0000 mg | ORAL_CAPSULE | Freq: Every day | ORAL | Status: AC
  Administered 2018-08-06 – 2018-08-08 (×3): 2 mg via ORAL
  Filled 2018-08-06: qty 1

## 2018-08-06 MED ORDER — IPRATROPIUM BROMIDE 0.02 % IN SOLN: 0.5000 mg | RESPIRATORY_TRACT | Status: DC | PRN

## 2018-08-06 NOTE — Progress Notes (Addendum)
PROGRESS NOTE    Jason Farmer  TMA:[id number] DOB: [birth year] DOA: 08/04/2018 PCP: Dorothyann Peng, NP    Brief Narrative:  HPI per Dr. Colman Cater Centola  is a 83 y.o. male, with history of hypertension, hyperlipidemia, GERD, restless leg syndrome who was brought to the ED by his wife with acute onset of confusion with visual and auditory hallucinations.  Associated symptoms include generalized weakness and unsteady gait.  Patient reports having chronic nonproductive cough but yesterday started having sudden pain in his left eye and left ear.  He also had an episode of nonbloody vomiting.  Denies back pain, dysuria or diarrhea.  Denies chest pain, shortness of breath, orthopnea, abdominal pain, palpitations, tingling or numbness of extremities.  Did not record temperature at home.  Denies any recent travel.  Wife reports having URI symptoms last week.  Denies any new medications.  Patient reports pain in his left shoulders which is chronic.  Vitals in the ED showed temperature of 100.2 F, respiratory rate of 21, blood pressure 96/49 mmHg, O2 sat >95% on room air.  Blood work showed WBC of 30.7K, hemoglobin of 14, BUN of 27 creatinine of 1.97 (baseline creatinine of 1), glucose of 119.  Lactic acid was normal.  Chest x-ray showed left lower lobe pneumonia.  Head CT was unremarkable for acute findings. Patient met criteria for early sepsis and sepsis pathway initiated.  Blood culture and flu PCR was sent.  Patient given empiric IV Rocephin and azithromycin along with 2 L normal saline bolus and blood pressure slowly improved. In the ED, he was confused and was not fully oriented but after receiving IV fluids mental status started improving to baseline. Hospitalist consulted for admission to early sepsis/Sirs secondary to left lobar pneumonia  Assessment & Plan:   Principal Problem:   Sepsis due to pneumonia [facility]) Active Problems:   Bacteremia   Hyperlipidemia   ANXIETY DISORDER,  GENERALIZED   RESTLESS LEG SYNDROME   GERD   Essential hypertension, benign   AKI (acute kidney injury) ([place])   Toxic metabolic encephalopathy   Lobar pneumonia, unspecified organism ([place])   Sepsis ([place])  #1 sepsis secondary to pneumonia and Streptococcus pneumonia bacteremia Patient presented with altered mental status, tachycardia, temperature of 100.2 with borderline blood pressure.  Chest x-ray concerning for left upper lobe pneumonia.  Urine strep pneumococcus antigen was positive.  Urine Legionella antigen pending.  Blood cultures positive for strep pneumonia which likely seeded from patient's lung infection.  Sensitivities pending.  Hallucinations improving.  WBC trending down.  Blood pressure improving as well as tachycardia.  Repeat blood cultures to ensure clearing of infection.  Continue empiric IV Rocephin, IV fluids, Mucinex, Flonase, scheduled Atrovent and Xopenex nebs, Claritin, PPI.  Supportive care.  Patient will likely need at least 2 weeks of antibiotics.  ID consultation pending.  Follow.  2.  Acute metabolic encephalopathy Patient with visual and auditory hallucinations on admission and on first hospital day.  Hallucinations improving per patient.  CT head negative.  Continue empiric IV antibiotics, supportive care.  Follow.  3.  Gastroesophageal reflux disease PPI.  4.  Acute renal failure Likely secondary to a prerenal azotemia.  Patient noted to have borderline blood pressure on admission.  Renal function improving with hydration.  Decrease IV fluids to 75 cc/h. Follow.  5.  Hypertension Patient noted to be hypotensive on admission and as such antihypertensive medications on hold.  Blood pressure improving.  Decrease IV fluids to 75 cc/h and subsequently  saline lock tomorrow. Follow.  6.  Hyperlipidemia Continue statin.   DVT prophylaxis: Heparin Code Status: Full Family Communication: Updated patient and wife at bedside. Disposition Plan: Likely home when  clinically improved and mentation back to baseline.   Consultants:   ID pending  Procedures:   CT head without contrast 08/04/2018  Chest x-ray 08/04/2018  Antimicrobials:   IV azithromycin 08/04/2018>>>> oral azithromycin 08/05/2018  IV Rocephin 08/04/2018   Subjective: Patient in bed.  States hallucinations are improving.  No nausea or emesis.  Shortness of breath improving.  No chest pain.  Wife at bedside.   Objective: Vitals:   08/05/18 2012 08/05/18 2104 08/06/18 0438 08/06/18 0744  BP:  139/83 (!) 146/79   Pulse:  94 90   Resp:  16 16   Temp:  98.2 F (36.8 C) 98 F (36.7 C)   TempSrc:  Oral Oral   SpO2: 96% 98% 97% 97%  Weight:      Height:        Intake/Output Summary (Last 24 hours) at 08/06/2018 1150 Last data filed at 08/06/2018 1031 Gross per 24 hour  Intake 1975.35 ml  Output -  Net 1975.35 ml   Filed Weights   08/05/18 1913  Weight: 74.6 kg    Examination:  General exam: NAD.  Dry mucous membranes. Respiratory system: Coarse breath sounds on the left.  No wheezing, no crackles, normal respiratory effort.   Cardiovascular system: Regular rate and rhythm no murmurs rubs or gallops.  No JVD.  No lower extremity edema.  Gastrointestinal system: Abdomen is soft, nontender, nondistended, positive bowel sounds.  No rebound.  No guarding. Central nervous system: Alert and oriented. No focal neurological deficits. Extremities: Symmetric 5 x 5 power. Skin: No rashes, lesions or ulcers Psychiatry: Judgement and insight appear normal. Mood & affect appropriate.     Data Reviewed: I have personally reviewed following labs and imaging studies  CBC: Recent Labs  Lab 08/04/18 0652 08/05/18 0341 08/06/18 0427  WBC 30.7* 20.1* 7.4  NEUTROABS  --   --  4.9  HGB 14.1 13.4 11.6*  HCT 42.8 41.1 36.5*  MCV 93.7 92.8 93.4  PLT 224 268 539   Basic Metabolic Panel: Recent Labs  Lab 08/04/18 0652 08/05/18 0341 08/06/18 0427  NA 136 137 139  K  4.3 3.8 3.9  CL 102 106 109  CO2 23 21* 21*  GLUCOSE 119* 112* 97  BUN 27* 28* 20  CREATININE 1.97* 1.39* 1.21  CALCIUM 8.7* 8.7* 8.2*  MG  --   --  2.1   GFR: Estimated Creatinine Clearance: 46.3 mL/min (by C-G formula based on SCr of 1.21 mg/dL). Liver Function Tests: Recent Labs  Lab 08/04/18 0652  AST 16  ALT 13  ALKPHOS 44  BILITOT 1.1  PROT 6.5  ALBUMIN 3.5   No results for input(s): LIPASE, AMYLASE in the last 168 hours. Recent Labs  Lab 08/04/18 0653  AMMONIA 17   Coagulation Profile: No results for input(s): INR, PROTIME in the last 168 hours. Cardiac Enzymes: No results for input(s): CKTOTAL, CKMB, CKMBINDEX, TROPONINI in the last 168 hours. BNP (last 3 results) No results for input(s): PROBNP in the last 8760 hours. HbA1C: No results for input(s): HGBA1C in the last 72 hours. CBG: Recent Labs  Lab 08/06/18 0742  GLUCAP 87   Lipid Profile: No results for input(s): CHOL, HDL, LDLCALC, TRIG, CHOLHDL, LDLDIRECT in the last 72 hours. Thyroid Function Tests: No results for input(s): TSH, T4TOTAL, FREET4, T3FREE,  THYROIDAB in the last 72 hours. Anemia Panel: No results for input(s): VITAMINB12, FOLATE, FERRITIN, TIBC, IRON, RETICCTPCT in the last 72 hours. Sepsis Labs: Recent Labs  Lab 08/04/18 0850  LATICACIDVEN 1.51    Recent Results (from the past 240 hour(s))  Blood culture (routine x 2)     Status: Abnormal (Preliminary result)   Collection Time: 08/04/18  8:39 AM  Result Value Ref Range Status   Specimen Description   Final    BLOOD RIGHT ANTECUBITAL Performed at [facility] [address] [place], [place] [address]    Special Requests   Final    BOTTLES DRAWN AEROBIC AND ANAEROBIC Blood Culture adequate volume Performed at [facility] [address] [place], [place] [address]    Culture  Setup Time   Final    GRAM POSITIVE COCCI AEROBIC BOTTLE ONLY Organism ID to follow CRITICAL RESULT CALLED  TO, READ BACK BY AND VERIFIED WITH: L POINDEXTER PHARMD 0201 08/05/18 A BROWNING    Culture (A)  Final    STREPTOCOCCUS PNEUMONIAE SUSCEPTIBILITIES TO FOLLOW Performed at [facility], [place] [address], [place], [place] [address]    Report Status PENDING  Incomplete  Blood Culture ID Panel (Reflexed)     Status: Abnormal   Collection Time: 08/04/18  8:39 AM  Result Value Ref Range Status   Enterococcus species NOT DETECTED NOT DETECTED Final   Listeria monocytogenes NOT DETECTED NOT DETECTED Final   Staphylococcus species NOT DETECTED NOT DETECTED Final   Staphylococcus aureus (BCID) NOT DETECTED NOT DETECTED Final   Streptococcus species DETECTED (A) NOT DETECTED Final    Comment: CRITICAL RESULT CALLED TO, READ BACK BY AND VERIFIED WITH: L POINDEXTER PHARMD 0201 08/05/18 A BROWNING    Streptococcus agalactiae NOT DETECTED NOT DETECTED Final   Streptococcus pneumoniae DETECTED (A) NOT DETECTED Final    Comment: CRITICAL RESULT CALLED TO, READ BACK BY AND VERIFIED WITH: L POINDEXTER PHARMD 0201 08/05/18 A BROWNING    Streptococcus pyogenes NOT DETECTED NOT DETECTED Final   Acinetobacter baumannii NOT DETECTED NOT DETECTED Final   Enterobacteriaceae species NOT DETECTED NOT DETECTED Final   Enterobacter cloacae complex NOT DETECTED NOT DETECTED Final   Escherichia coli NOT DETECTED NOT DETECTED Final   Klebsiella oxytoca NOT DETECTED NOT DETECTED Final   Klebsiella pneumoniae NOT DETECTED NOT DETECTED Final   Proteus species NOT DETECTED NOT DETECTED Final   Serratia marcescens NOT DETECTED NOT DETECTED Final   Haemophilus influenzae NOT DETECTED NOT DETECTED Final   Neisseria meningitidis NOT DETECTED NOT DETECTED Final   Pseudomonas aeruginosa NOT DETECTED NOT DETECTED Final   Candida albicans NOT DETECTED NOT DETECTED Final   Candida glabrata NOT DETECTED NOT DETECTED Final   Candida krusei NOT DETECTED NOT DETECTED Final   Candida parapsilosis NOT DETECTED NOT DETECTED  Final   Candida tropicalis NOT DETECTED NOT DETECTED Final    Comment: Performed at [facility], [place] [address] [place], [place] [address]  Blood culture (routine x 2)     Status: Abnormal (Preliminary result)   Collection Time: 08/04/18  9:00 AM  Result Value Ref Range Status   Specimen Description   Final    BLOOD LEFT ANTECUBITAL Performed at [facility] [address] [place],  [address]    Special Requests   Final    BOTTLES DRAWN AEROBIC AND ANAEROBIC Blood Culture adequate volume Performed at [facility] [address] [place], [place]  [address]    Culture  Setup Time   Final    GRAM POSITIVE COCCI AEROBIC BOTTLE ONLY CRITICAL VALUE NOTED.  VALUE IS CONSISTENT WITH PREVIOUSLY REPORTED AND CALLED VALUE. Performed at [facility], [place] [address], [place], [place] [address]    Culture STREPTOCOCCUS PNEUMONIAE (A)  Final   Report Status PENDING  Incomplete         Radiology Studies: Dg Chest 2 View  Result Date: 08/05/2018 CLINICAL DATA:  Shortness of breath. EXAM: CHEST - 2 VIEW COMPARISON:  Radiographs of August 04, 2018. FINDINGS: The heart size and mediastinal contours are within normal limits. No pneumothorax or pleural effusion is noted. Stable left upper lobe airspace opacity is noted concerning for pneumonia. Scarring is noted throughout both lungs. The visualized skeletal structures are unremarkable. IMPRESSION: Stable left upper lobe airspace opacity is noted consistent with pneumonia. Followup PA and lateral chest X-ray is recommended in 3-4 weeks following trial of antibiotic therapy to ensure resolution and exclude underlying malignancy. Electronically Signed   By: Marijo Conception, M.D.   On: 08/05/2018 17:22        Scheduled Meds: . aspirin EC  81 mg Oral Daily  . azithromycin  500 mg Oral Q24H  . cholecalciferol  1,000 Units Oral Daily  . fluticasone  2 spray Each Nare Daily  . guaiFENesin   1,200 mg Oral BID  . heparin  5,000 Units Subcutaneous Q8H  . ipratropium  0.5 mg Nebulization BID  . latanoprost  1 drop Both Eyes QHS  . levalbuterol  1.25 mg Nebulization BID  . loratadine  10 mg Oral q1800  . pantoprazole  40 mg Oral Daily  . simvastatin  10 mg Oral QODAY   Continuous Infusions: . sodium chloride 75 mL/hr at 08/06/18 1138  . cefTRIAXone (ROCEPHIN)  IV 2 g (08/06/18 0308)     LOS: 2 days    Time spent: 35 minutes    Irine Seal, MD [facility] Pager [phone]  If 7PM-7AM, please contact night-coverage [email or web address] Password [email or web address] 08/06/2018, 11:50 AM

## 2018-08-06 NOTE — Consult Note (Signed)
[name] for Infectious Disease  Total days of antibiotics 3        Day 3 ceftriaxone/azithromycin       Reason for Consult: strep pneumoniae with bacteremia    Referring Physician: Grandville Silos  Principal Problem:   Sepsis due to pneumonia [facility]) Active Problems:   Hyperlipidemia   ANXIETY DISORDER, GENERALIZED   RESTLESS LEG SYNDROME   GERD   Essential hypertension, benign   AKI (acute kidney injury) ([place])   Toxic metabolic encephalopathy   Lobar pneumonia, unspecified organism ([place])   Sepsis ([place])   Bacteremia    HPI: Jason Farmer is a 83 y.o. male with hx of HTN, HLD, GERD, RLS who was admitted for feeling poorly a day prior to admit followed by acute onset of generalized weakness, unsteady gait, confusion. He reports having nasal congestion, non productive cough. URI symptoms followed after thanksgiving after seeing friends/family. In the ED, he was found to have fever of 100.3, RR 21, BP 90/60s with lab work showing impressive leukocytosis of 30.7K, CXR showing evidence of left lower lobe infiltrate per my read. He was started on ceftriaxone plus azithromycin emprically. Infectious work up revealed blood cx + strep pneumonaie. He is still feeling fatigued, improved from yesterday. Less confusion. But still having productive cough with clear phlegm. He is having profuse diarrhea - watery stool x 6 today - no cramping no blood in stool. Not previously having any diarrhea. Leukocytosis improving from 30.7 to 7.4.   Past Medical History:  Diagnosis Date  . Anxiety    "not a problem for a long time" (11/27/2016)  . Arthritis    "wrists, knees, shoulders, back" (11/27/2016)  . Chronic lower back pain    "5th disc is protruding" (11/27/2016)  . Essential tremor   . GERD (gastroesophageal reflux disease)   . Glaucoma   . Heart murmur    dx'd in [employer] in the 1960s; haven't been seen for it since "(11/27/2016)  . Hepatitis ~ 1958   "jaundice kind"  . HOH (hard of hearing)   .  Hyperlipemia   . Migraine    "none since my neck OR" (11/27/2016)  . Seasonal allergies   . TIA (transient ischemic attack) 04/2015  . Wears glasses   . Wears hearing aid     Allergies:  Allergies  Allergen Reactions  . Statins Other (See Comments)    Muscle cramps     MEDICATIONS: . aspirin EC  81 mg Oral Daily  . azithromycin  500 mg Oral Q24H  . cholecalciferol  1,000 Units Oral Daily  . fluticasone  2 spray Each Nare Daily  . guaiFENesin  1,200 mg Oral BID  . heparin  5,000 Units Subcutaneous Q8H  . ipratropium  0.5 mg Nebulization BID  . latanoprost  1 drop Both Eyes QHS  . levalbuterol  1.25 mg Nebulization BID  . loratadine  10 mg Oral q1800  . pantoprazole  40 mg Oral Daily  . simvastatin  10 mg Oral QODAY    Social History   Tobacco Use  . Smoking status: Former Smoker    Packs/day: 2.00    Years: 30.00    Pack years: 60.00    Types: Cigarettes    Last attempt to quit: 06/03/1984    Years since quitting: 34.1  . Smokeless tobacco: Never Used  . Tobacco comment: had AAA at the [facility]; also noted 2018 on Dr. Kyla Balzarine note   Substance Use Topics  . Alcohol use: No  Comment: 11/27/2016 "quit in 1985"  . Drug use: No    Family History  Problem Relation Age of Onset  . Dementia Father   . Heart failure Father   . Glaucoma Mother   . Colon cancer Neg Hx     Review of Systems  Constitutional: positive for fever, chills, diaphoresis, activity change, appetite change, fatigue and unexpected weight change.  HENT: positive for congestion, sore throat, rhinorrhea, sneezing, trouble swallowing and sinus pressure.  Eyes: Negative for photophobia and visual disturbance.  Respiratory: positive  for cough, but negative for chest tightness, shortness of breath, wheezing and stridor.  Cardiovascular: Negative for chest pain, palpitations and leg swelling.  Gastrointestinal: Negative for nausea, vomiting, abdominal pain, diarrhea, constipation, blood in stool, abdominal  distention and anal bleeding.  Genitourinary: Negative for dysuria, hematuria, flank pain and difficulty urinating.  Musculoskeletal: Negative for myalgias, back pain, joint swelling, arthralgias and gait problem.  Skin: Negative for color change, pallor, rash and wound.  Neurological: Negative for dizziness, tremors, weakness and light-headedness.  Hematological: Negative for adenopathy. Does not bruise/bleed easily.  Psychiatric/Behavioral: Negative for behavioral problems, confusion, sleep disturbance, dysphoric mood, decreased concentration and agitation.     OBJECTIVE: Temp:  [98 F (36.7 C)-98.2 F (36.8 C)] 98 F (36.7 C) (12/13 1332) Pulse Rate:  [90-94] 93 (12/13 1332) Resp:  [12-16] 12 (12/13 1332) BP: (139-156)/(79-86) 156/86 (12/13 1332) SpO2:  [96 %-100 %] 100 % (12/13 1332) Weight:  [74.6 kg] 74.6 kg (12/12 1913) Physical Exam  Constitutional: He is oriented to person, place, and time. He appears well-developed and well-nourished. No distress.  HENT:  Mouth/Throat: Oropharynx is clear and moist. No oropharyngeal exudate.  Cardiovascular: Normal rate, regular rhythm and normal heart sounds. Exam reveals no gallop and no friction rub.  No murmur heard.  Pulmonary/Chest: Effort normal and breath sounds normal. No respiratory distress. He has no wheezes.  Abdominal: Soft. Bowel sounds are normal. He exhibits no distension. There is no tenderness.  Lymphadenopathy:  He has no cervical adenopathy.  Neurological: He is alert and oriented to person, place, and time.  Skin: Skin is warm and dry. No rash noted. No erythema.  Psychiatric: He has a normal mood and affect. His behavior is normal.     LABS: Results for orders placed or performed during the hospital encounter of 08/04/18 (from the past 48 hour(s))  Basic metabolic panel     Status: Abnormal   Collection Time: 08/05/18  3:41 AM  Result Value Ref Range   Sodium 137 135 - 145 mmol/L   Potassium 3.8 3.5 - 5.1  mmol/L   Chloride 106 98 - 111 mmol/L   CO2 21 (L) 22 - 32 mmol/L   Glucose, Bld 112 (H) 70 - 99 mg/dL   BUN 28 (H) 8 - 23 mg/dL   Creatinine, Ser 1.39 (H) 0.61 - 1.24 mg/dL   Calcium 8.7 (L) 8.9 - 10.3 mg/dL   GFR calc non Af Amer 47 (L) >60 mL/min   GFR calc Af Amer 55 (L) >60 mL/min   Anion gap 10 5 - 15    Comment: Performed at [facility], [place] [address], [place], [place] [address]  CBC     Status: Abnormal   Collection Time: 08/05/18  3:41 AM  Result Value Ref Range   WBC 20.1 (H) 4.0 - 10.5 K/uL   RBC 4.43 4.22 - 5.81 MIL/uL   Hemoglobin 13.4 13.0 - 17.0 g/dL   HCT 41.1 39.0 - 52.0 %  MCV 92.8 80.0 - 100.0 fL   MCH 30.2 26.0 - 34.0 pg   MCHC 32.6 30.0 - 36.0 g/dL   RDW 12.9 11.5 - 15.5 %   Platelets 268 150 - 400 K/uL   nRBC 0.0 0.0 - 0.2 %    Comment: Performed at [facility], [place] [address], [place], [place] [address]  CBC with Differential/Platelet     Status: Abnormal   Collection Time: 08/06/18  4:27 AM  Result Value Ref Range   WBC 7.4 4.0 - 10.5 K/uL   RBC 3.91 (L) 4.22 - 5.81 MIL/uL   Hemoglobin 11.6 (L) 13.0 - 17.0 g/dL   HCT 36.5 (L) 39.0 - 52.0 %   MCV 93.4 80.0 - 100.0 fL   MCH 29.7 26.0 - 34.0 pg   MCHC 31.8 30.0 - 36.0 g/dL   RDW 13.1 11.5 - 15.5 %   Platelets 265 150 - 400 K/uL   nRBC 0.0 0.0 - 0.2 %   Neutrophils Relative % 66 %   Neutro Abs 4.9 1.7 - 7.7 K/uL   Lymphocytes Relative 25 %   Lymphs Abs 1.9 0.7 - 4.0 K/uL   Monocytes Relative 7 %   Monocytes Absolute 0.5 0.1 - 1.0 K/uL   Eosinophils Relative 2 %   Eosinophils Absolute 0.1 0.0 - 0.5 K/uL   Basophils Relative 0 %   Basophils Absolute 0.0 0.0 - 0.1 K/uL   Immature Granulocytes 0 %   Abs Immature Granulocytes 0.03 0.00 - 0.07 K/uL    Comment: Performed at [facility], [place] [address], [place], [place] [address]  Basic metabolic panel     Status: Abnormal   Collection Time: 08/06/18  4:27 AM  Result Value Ref Range    Sodium 139 135 - 145 mmol/L   Potassium 3.9 3.5 - 5.1 mmol/L   Chloride 109 98 - 111 mmol/L   CO2 21 (L) 22 - 32 mmol/L   Glucose, Bld 97 70 - 99 mg/dL   BUN 20 8 - 23 mg/dL   Creatinine, Ser 1.21 0.61 - 1.24 mg/dL   Calcium 8.2 (L) 8.9 - 10.3 mg/dL   GFR calc non Af Amer 56 (L) >60 mL/min   GFR calc Af Amer >60 >60 mL/min   Anion gap 9 5 - 15    Comment: Performed at [facility], [place] [address], [place], [place] [address]  Magnesium     Status: None   Collection Time: 08/06/18  4:27 AM  Result Value Ref Range   Magnesium 2.1 1.7 - 2.4 mg/dL    Comment: Performed at [facility], [place] [address], [place], [place] [address]  Glucose, capillary     Status: None   Collection Time: 08/06/18  7:42 AM  Result Value Ref Range   Glucose-Capillary 87 70 - 99 mg/dL    MICRO: reveiwed IMAGING: Dg Chest 2 View  Result Date: 08/05/2018 CLINICAL DATA:  Shortness of breath. EXAM: CHEST - 2 VIEW COMPARISON:  Radiographs of August 04, 2018. FINDINGS: The heart size and mediastinal contours are within normal limits. No pneumothorax or pleural effusion is noted. Stable left upper lobe airspace opacity is noted concerning for pneumonia. Scarring is noted throughout both lungs. The visualized skeletal structures are unremarkable. IMPRESSION: Stable left upper lobe airspace opacity is noted consistent with pneumonia. Followup PA and lateral chest X-ray is recommended in 3-4 weeks following trial of antibiotic therapy to ensure resolution and exclude underlying malignancy. Electronically  Signed   By: Marijo Conception, M.D.   On: 08/05/2018 17:22   Assessment/Plan:  Strep. Pneumonia with secondary bacteremia. Now has abtx associated diarrhea  - continue with ceftriaxone 2gm iv daily. Will repeat blood cx to see that he is clearing his bacteremia - we will plan to treat for 10 more days of abtx. Can likely transition to orals when ready to be discharged for home -  leukocytosis is improving  He had new onset diarrhea= this is likely abtx associated diarrhea since this just started today in the setting of getting abtx for his pneumonia. No need to test for cdifficile since clinically is not having fevers, abdominal discomfort. Will start him on imodium to help with symptoms.  Elzie Rings [facility] for Infectious Diseases [phone]

## 2018-08-06 NOTE — Care Management Important Message (Signed)
Important Message  Patient Details  Name: THADDUS MCDOWELL MRN: [id number] Date of Birth: 08-25-1935   Medicare Important Message Given:  Yes    Kerin Salen 08/06/2018, 10:32 AMImportant Message  Patient Details  Name: ELEFTHERIOS DUDENHOEFFER MRN: [id number] Date of Birth: 1935-05-28   Medicare Important Message Given:  Yes    Kerin Salen 08/06/2018, 10:32 AM

## 2018-08-07 LAB — CULTURE, BLOOD (ROUTINE X 2)
Special Requests: ADEQUATE
Special Requests: ADEQUATE

## 2018-08-07 LAB — DIFFERENTIAL
Abs Immature Granulocytes: 0.04 10*3/uL (ref 0.00–0.07)
Basophils Absolute: 0 10*3/uL (ref 0.0–0.1)
Basophils Relative: 0 %
Eosinophils Absolute: 0.1 10*3/uL (ref 0.0–0.5)
Eosinophils Relative: 2 %
Immature Granulocytes: 1 %
Lymphocytes Relative: 25 %
Lymphs Abs: 1.5 10*3/uL (ref 0.7–4.0)
Monocytes Absolute: 0.5 10*3/uL (ref 0.1–1.0)
Monocytes Relative: 8 %
Neutro Abs: 3.9 10*3/uL (ref 1.7–7.7)
Neutrophils Relative %: 64 %

## 2018-08-07 LAB — BASIC METABOLIC PANEL
Anion gap: 9 (ref 5–15)
BUN: 12 mg/dL (ref 8–23)
CO2: 21 mmol/L — ABNORMAL LOW (ref 22–32)
Calcium: 8.4 mg/dL — ABNORMAL LOW (ref 8.9–10.3)
Chloride: 108 mmol/L (ref 98–111)
Creatinine, Ser: 1 mg/dL (ref 0.61–1.24)
GFR calc Af Amer: >60 mL/min (ref >60)
GFR calc non Af Amer: >60 mL/min (ref >60)
Glucose, Bld: 94 mg/dL (ref 70–99)
Potassium: 4.1 mmol/L (ref 3.5–5.1)
Sodium: 138 mmol/L (ref 135–145)

## 2018-08-07 LAB — CBC
HCT: 38.8 % — ABNORMAL LOW (ref 39.0–52.0)
Hemoglobin: 12.3 g/dL — ABNORMAL LOW (ref 13.0–17.0)
MCH: 29.2 pg (ref 26.0–34.0)
MCHC: 31.7 g/dL (ref 30.0–36.0)
MCV: 92.2 fL (ref 80.0–100.0)
Platelets: 303 10*3/uL (ref 150–400)
RBC: 4.21 MIL/uL — ABNORMAL LOW (ref 4.22–5.81)
RDW: 13 % (ref 11.5–15.5)
WBC: 6 10*3/uL (ref 4.0–10.5)
nRBC: 0 % (ref 0.0–0.2)

## 2018-08-07 LAB — GLUCOSE, CAPILLARY: Glucose-Capillary: 80 mg/dL (ref 70–99)

## 2018-08-07 NOTE — Progress Notes (Signed)
PROGRESS NOTE    Jason Farmer  CNO:[id number] DOB: October 19, 1934 DOA: 08/04/2018 PCP: Dorothyann Peng, NP    Brief Narrative:  HPI per Dr. Colman Cater Viveros  is a 83 y.o. male, with history of hypertension, hyperlipidemia, GERD, restless leg syndrome who was brought to the ED by his wife with acute onset of confusion with visual and auditory hallucinations.  Associated symptoms include generalized weakness and unsteady gait.  Patient reports having chronic nonproductive cough but yesterday started having sudden pain in his left eye and left ear.  He also had an episode of nonbloody vomiting.  Denies back pain, dysuria or diarrhea.  Denies chest pain, shortness of breath, orthopnea, abdominal pain, palpitations, tingling or numbness of extremities.  Did not record temperature at home.  Denies any recent travel.  Wife reports having URI symptoms last week.  Denies any new medications.  Patient reports pain in his left shoulders which is chronic.  Vitals in the ED showed temperature of 100.2 F, respiratory rate of 21, blood pressure 96/49 mmHg, O2 sat >95% on room air.  Blood work showed WBC of 30.7K, hemoglobin of 14, BUN of 27 creatinine of 1.97 (baseline creatinine of 1), glucose of 119.  Lactic acid was normal.  Chest x-ray showed left lower lobe pneumonia.  Head CT was unremarkable for acute findings. Patient met criteria for early sepsis and sepsis pathway initiated.  Blood culture and flu PCR was sent.  Patient given empiric IV Rocephin and azithromycin along with 2 L normal saline bolus and blood pressure slowly improved. In the ED, he was confused and was not fully oriented but after receiving IV fluids mental status started improving to baseline. Hospitalist consulted for admission to early sepsis/Sirs secondary to left lobar pneumonia  Assessment & Plan:   Principal Problem:   Sepsis due to pneumonia [facility]) Active Problems:   Bacteremia   Hyperlipidemia   ANXIETY DISORDER,  GENERALIZED   RESTLESS LEG SYNDROME   GERD   Essential hypertension, benign   AKI (acute kidney injury) ([place])   Toxic metabolic encephalopathy   Lobar pneumonia, unspecified organism ([place])   Sepsis ([place])  #1 sepsis secondary to pneumonia and Streptococcus pneumonia bacteremia Patient presented with altered mental status, tachycardia, temperature of 100.2 with borderline blood pressure.  Chest x-ray concerning for left upper lobe pneumonia.  Urine strep pneumococcus antigen was positive.  Urine Legionella antigen pending.  Blood cultures positive for strep pneumonia which likely seeded from patient's lung infection.  Sensitivities pending.  Hallucinations improving.  WBC trending down.  Blood pressure improving as well as tachycardia.  Repeat blood cultures ordered and currently pending. Continue empiric IV Rocephin, Mucinex, Flonase, scheduled Atrovent and Xopenex nebs, Claritin, PPI.  Saline lock IV fluids.  Supportive care.  Patient will likely need at least 2 weeks of antibiotics.  ID following and appreciate input and recommendations.   2.  Acute metabolic encephalopathy Patient with visual and auditory hallucinations on admission and on first hospital day.  Hallucinations improved and patient denies any further hallucinations.  CT head negative.  Continue IV Rocephin.  Supportive care.   3.  Gastroesophageal reflux disease Continue PPI.  4.  Acute renal failure Likely secondary to a prerenal azotemia.  Patient noted to have borderline blood pressure on admission.  Renal function has improved with hydration.  Saline lock IV fluids.  5.  Hypertension Patient noted to be hypotensive on admission and as such antihypertensive medications on hold.  Blood pressure improving.  Saline lock  IV fluids.  Follow.   6.  Hyperlipidemia Continue statin.  7.  Loose stools Likely antibiotic induced.  Patient afebrile.  WBC has trended down.  Per ID doubt if C. difficile colitis and no further work-up  needed.  Continue Imodium as needed.   DVT prophylaxis: Heparin Code Status: Full Family Communication: Updated patient and wife at bedside. Disposition Plan: Likely home when clinically improved and mentation back to baseline.   Consultants:   ID: Dr. Baxter Flattery 08/06/2018  Procedures:   CT head without contrast 08/04/2018  Chest x-ray 08/04/2018  Antimicrobials:   IV azithromycin 08/04/2018>>>> oral azithromycin 08/05/2018  IV Rocephin 08/04/2018   Subjective: Patient sitting up in chair.  States had several loose stools yesterday which he describes as soft.  Patient states had 4 loose stools already this morning.  Patient denies any further hallucinations.  Feels mental status has improved.  Denies any chest pain.  Denies any shortness of breath.  Asking when he can go home.  Wife at bedside.    Objective: Vitals:   08/06/18 0744 08/06/18 1332 08/06/18 2039 08/07/18 0417  BP:  (!) 156/86 140/90 139/78  Pulse:  93 94 89  Resp:  12 16 14   Temp:  98 F (36.7 C) 98.1 F (36.7 C) 98.4 F (36.9 C)  TempSrc:  Oral Oral Oral  SpO2: 97% 100% 98% 97%  Weight:      Height:        Intake/Output Summary (Last 24 hours) at 08/07/2018 1108 Last data filed at 08/06/2018 1806 Gross per 24 hour  Intake 360 ml  Output -  Net 360 ml   Filed Weights   08/05/18 1913  Weight: 74.6 kg    Examination:  General exam: NAD.  Respiratory system: Decreased coarse breath sounds on the left.  No wheezing, no crackles.  Normal respiratory effort.   Cardiovascular system: RRR no murmurs rubs or gallops.  No JVD.  No lower extremity edema.  Gastrointestinal system: Abdomen is nontender, nondistended, soft, positive bowel sounds.  No rebound.  No guarding.  Central nervous system: Alert and oriented. No focal neurological deficits. Extremities: Symmetric 5 x 5 power. Skin: No rashes, lesions or ulcers Psychiatry: Judgement and insight appear normal. Mood & affect appropriate.      Data Reviewed: I have personally reviewed following labs and imaging studies  CBC: Recent Labs  Lab 08/04/18 0652 08/05/18 0341 08/06/18 0427 08/07/18 0720  WBC 30.7* 20.1* 7.4 6.0  NEUTROABS  --   --  4.9 3.9  HGB 14.1 13.4 11.6* 12.3*  HCT 42.8 41.1 36.5* 38.8*  MCV 93.7 92.8 93.4 92.2  PLT 224 268 265 833   Basic Metabolic Panel: Recent Labs  Lab 08/04/18 0652 08/05/18 0341 08/06/18 0427 08/07/18 0522  NA 136 137 139 138  K 4.3 3.8 3.9 4.1  CL 102 106 109 108  CO2 23 21* 21* 21*  GLUCOSE 119* 112* 97 94  BUN 27* 28* 20 12  CREATININE 1.97* 1.39* 1.21 1.00  CALCIUM 8.7* 8.7* 8.2* 8.4*  MG  --   --  2.1  --    GFR: Estimated Creatinine Clearance: 56.1 mL/min (by C-G formula based on SCr of 1 mg/dL). Liver Function Tests: Recent Labs  Lab 08/04/18 0652  AST 16  ALT 13  ALKPHOS 44  BILITOT 1.1  PROT 6.5  ALBUMIN 3.5   No results for input(s): LIPASE, AMYLASE in the last 168 hours. Recent Labs  Lab 08/04/18 0653  AMMONIA 17  Coagulation Profile: No results for input(s): INR, PROTIME in the last 168 hours. Cardiac Enzymes: No results for input(s): CKTOTAL, CKMB, CKMBINDEX, TROPONINI in the last 168 hours. BNP (last 3 results) No results for input(s): PROBNP in the last 8760 hours. HbA1C: No results for input(s): HGBA1C in the last 72 hours. CBG: Recent Labs  Lab 08/06/18 0742 08/07/18 0742  GLUCAP 87 80   Lipid Profile: No results for input(s): CHOL, HDL, LDLCALC, TRIG, CHOLHDL, LDLDIRECT in the last 72 hours. Thyroid Function Tests: No results for input(s): TSH, T4TOTAL, FREET4, T3FREE, THYROIDAB in the last 72 hours. Anemia Panel: No results for input(s): VITAMINB12, FOLATE, FERRITIN, TIBC, IRON, RETICCTPCT in the last 72 hours. Sepsis Labs: Recent Labs  Lab 08/04/18 0850  LATICACIDVEN 1.51    Recent Results (from the past 240 hour(s))  Blood culture (routine x 2)     Status: Abnormal   Collection Time: 08/04/18  8:39 AM   Result Value Ref Range Status   Specimen Description   Final    BLOOD RIGHT ANTECUBITAL Performed at [facility] [address], [place], [place] [address]    Special Requests   Final    BOTTLES DRAWN AEROBIC AND ANAEROBIC Blood Culture adequate volume Performed at [facility] [address], [place], [place] [address]    Culture  Setup Time   Final    GRAM POSITIVE COCCI AEROBIC BOTTLE ONLY CRITICAL RESULT CALLED TO, READ BACK BY AND VERIFIED WITH: L POINDEXTER PHARMD 0201 08/05/18 A BROWNING Performed at [facility], [place] [address] [place], [place] [address]    Culture STREPTOCOCCUS PNEUMONIAE (A)  Final   Report Status 08/07/2018 FINAL  Final   Organism ID, Bacteria STREPTOCOCCUS PNEUMONIAE  Final      Susceptibility   Streptococcus pneumoniae - MIC*    ERYTHROMYCIN >=8 RESISTANT Resistant     LEVOFLOXACIN 1 SENSITIVE Sensitive     VANCOMYCIN 0.5 SENSITIVE Sensitive     PENICILLIN (meningitis) <=0.06 SENSITIVE Sensitive     PENICILLIN (non-meningitis) <=0.06 SENSITIVE Sensitive     CEFTRIAXONE (non-meningitis) <=0.12 SENSITIVE Sensitive     CEFTRIAXONE (meningitis) <=0.12 SENSITIVE Sensitive     * STREPTOCOCCUS PNEUMONIAE  Blood Culture ID Panel (Reflexed)     Status: Abnormal   Collection Time: 08/04/18  8:39 AM  Result Value Ref Range Status   Enterococcus species NOT DETECTED NOT DETECTED Final   Listeria monocytogenes NOT DETECTED NOT DETECTED Final   Staphylococcus species NOT DETECTED NOT DETECTED Final   Staphylococcus aureus (BCID) NOT DETECTED NOT DETECTED Final   Streptococcus species DETECTED (A) NOT DETECTED Final    Comment: CRITICAL RESULT CALLED TO, READ BACK BY AND VERIFIED WITH: L POINDEXTER PHARMD 0201 08/05/18 A BROWNING    Streptococcus agalactiae NOT DETECTED NOT DETECTED Final   Streptococcus pneumoniae DETECTED (A) NOT DETECTED Final    Comment: CRITICAL RESULT CALLED TO, READ BACK BY AND VERIFIED  WITH: L POINDEXTER PHARMD 0201 08/05/18 A BROWNING    Streptococcus pyogenes NOT DETECTED NOT DETECTED Final   Acinetobacter baumannii NOT DETECTED NOT DETECTED Final   Enterobacteriaceae species NOT DETECTED NOT DETECTED Final   Enterobacter cloacae complex NOT DETECTED NOT DETECTED Final   Escherichia coli NOT DETECTED NOT DETECTED Final   Klebsiella oxytoca NOT DETECTED NOT DETECTED Final   Klebsiella pneumoniae NOT DETECTED NOT DETECTED Final   Proteus species NOT DETECTED NOT DETECTED Final   Serratia marcescens NOT DETECTED NOT DETECTED Final   Haemophilus influenzae  NOT DETECTED NOT DETECTED Final   Neisseria meningitidis NOT DETECTED NOT DETECTED Final   Pseudomonas aeruginosa NOT DETECTED NOT DETECTED Final   Candida albicans NOT DETECTED NOT DETECTED Final   Candida glabrata NOT DETECTED NOT DETECTED Final   Candida krusei NOT DETECTED NOT DETECTED Final   Candida parapsilosis NOT DETECTED NOT DETECTED Final   Candida tropicalis NOT DETECTED NOT DETECTED Final    Comment: Performed at [facility], [place] [address] [place], [place] [address]  Blood culture (routine x 2)     Status: Abnormal   Collection Time: 08/04/18  9:00 AM  Result Value Ref Range Status   Specimen Description   Final    BLOOD LEFT ANTECUBITAL Performed at [facility] [address] [place], [place] [address]    Special Requests   Final    BOTTLES DRAWN AEROBIC AND ANAEROBIC Blood Culture adequate volume Performed at [facility] [address], [place], [place] [address]    Culture  Setup Time   Final    GRAM POSITIVE COCCI AEROBIC BOTTLE ONLY CRITICAL VALUE NOTED.  VALUE IS CONSISTENT WITH PREVIOUSLY REPORTED AND CALLED VALUE.    Culture (A)  Final    STREPTOCOCCUS PNEUMONIAE SUSCEPTIBILITIES PERFORMED ON PREVIOUS CULTURE WITHIN THE LAST 5 DAYS. Performed at [facility], [place] [address], [place], [place] [address]    Report Status 08/07/2018  FINAL  Final         Radiology Studies: Dg Chest 2 View  Result Date: 08/05/2018 CLINICAL DATA:  Shortness of breath. EXAM: CHEST - 2 VIEW COMPARISON:  Radiographs of August 04, 2018. FINDINGS: The heart size and mediastinal contours are within normal limits. No pneumothorax or pleural effusion is noted. Stable left upper lobe airspace opacity is noted concerning for pneumonia. Scarring is noted throughout both lungs. The visualized skeletal structures are unremarkable. IMPRESSION: Stable left upper lobe airspace opacity is noted consistent with pneumonia. Followup PA and lateral chest X-ray is recommended in 3-4 weeks following trial of antibiotic therapy to ensure resolution and exclude underlying malignancy. Electronically Signed   By: Marijo Conception, M.D.   On: 08/05/2018 17:22        Scheduled Meds: . aspirin EC  81 mg Oral Daily  . azithromycin  500 mg Oral Q24H  . cholecalciferol  1,000 Units Oral Daily  . fluticasone  2 spray Each Nare Daily  . guaiFENesin  1,200 mg Oral BID  . heparin  5,000 Units Subcutaneous Q8H  . latanoprost  1 drop Both Eyes QHS  . loperamide  2 mg Oral Daily  . loratadine  10 mg Oral q1800  . pantoprazole  40 mg Oral Daily  . simvastatin  10 mg Oral QODAY   Continuous Infusions: . cefTRIAXone (ROCEPHIN)  IV 2 g (08/07/18 0500)     LOS: 3 days    Time spent: 35 minutes    Irine Seal, MD [facility] Pager [phone]  If 7PM-7AM, please contact night-coverage [email or web address] Password [email or web address] 08/07/2018, 11:08 AM

## 2018-08-08 DIAGNOSIS — R197 Diarrhea, unspecified: Secondary | ICD-10-CM

## 2018-08-08 LAB — BASIC METABOLIC PANEL
Anion gap: 10 (ref 5–15)
BUN: 13 mg/dL (ref 8–23)
CO2: 21 mmol/L — ABNORMAL LOW (ref 22–32)
Calcium: 8.6 mg/dL — ABNORMAL LOW (ref 8.9–10.3)
Chloride: 107 mmol/L (ref 98–111)
Creatinine, Ser: 0.95 mg/dL (ref 0.61–1.24)
GFR calc Af Amer: >60 mL/min (ref >60)
GFR calc non Af Amer: >60 mL/min (ref >60)
Glucose, Bld: 92 mg/dL (ref 70–99)
Potassium: 3.7 mmol/L (ref 3.5–5.1)
Sodium: 138 mmol/L (ref 135–145)

## 2018-08-08 LAB — CBC
HCT: 38.6 % — ABNORMAL LOW (ref 39.0–52.0)
Hemoglobin: 12.4 g/dL — ABNORMAL LOW (ref 13.0–17.0)
MCH: 29.9 pg (ref 26.0–34.0)
MCHC: 32.1 g/dL (ref 30.0–36.0)
MCV: 93 fL (ref 80.0–100.0)
Platelets: 315 10*3/uL (ref 150–400)
RBC: 4.15 MIL/uL — ABNORMAL LOW (ref 4.22–5.81)
RDW: 13.1 % (ref 11.5–15.5)
WBC: 7 10*3/uL (ref 4.0–10.5)
nRBC: 0 % (ref 0.0–0.2)

## 2018-08-08 LAB — LEGIONELLA PNEUMOPHILA SEROGP 1 UR AG: L. pneumophila Serogp 1 Ur Ag: NEGATIVE

## 2018-08-08 LAB — GLUCOSE, CAPILLARY: Glucose-Capillary: 88 mg/dL (ref 70–99)

## 2018-08-08 MED ORDER — CEPHALEXIN 500 MG PO CAPS: 500.0000 mg | ORAL_CAPSULE | Freq: Four times a day (QID) | ORAL | 0 refills | Status: AC

## 2018-08-08 MED ORDER — LOPERAMIDE HCL 2 MG PO CAPS: 2.0000 mg | ORAL_CAPSULE | ORAL | 0 refills | Status: DC | PRN

## 2018-08-08 MED ORDER — FLUTICASONE PROPIONATE 50 MCG/ACT NA SUSP: 2.0000 | Freq: Every day | NASAL | 0 refills | Status: DC

## 2018-08-08 MED ORDER — ALBUTEROL SULFATE HFA 108 (90 BASE) MCG/ACT IN AERS: 2.0000 | INHALATION_SPRAY | Freq: Three times a day (TID) | RESPIRATORY_TRACT | 0 refills | Status: DC

## 2018-08-08 MED ORDER — GUAIFENESIN ER 600 MG PO TB12: 1200.0000 mg | ORAL_TABLET | Freq: Two times a day (BID) | ORAL | 0 refills | Status: AC

## 2018-08-08 NOTE — Discharge Summary (Signed)
Physician Discharge Summary  Jason Farmer WIO:[id number] DOB: Mar 13, 1935 DOA: 08/04/2018  PCP: Jason Peng, NP  Admit date: 08/04/2018 Discharge date: 08/08/2018  Time spent: 60 minutes  Recommendations for Outpatient Follow-up:  1. Follow-up with Jason Peng, NP in 1 to 2 weeks.  On follow-up patient will need a basic metabolic profile done to follow-up on electrolytes and renal function.  Patient will likely need a repeat chest x-ray done in about 4 weeks for resolution of pneumonia.   Discharge Diagnoses:  Principal Problem:   Sepsis due to pneumonia [facility]) Active Problems:   Bacteremia   Hyperlipidemia   ANXIETY DISORDER, GENERALIZED   RESTLESS LEG SYNDROME   GERD   Essential hypertension, benign   AKI (acute kidney injury) ([place])   Toxic metabolic encephalopathy   Lobar pneumonia, unspecified organism ([place])   Sepsis ([place])   Discharge Condition: Stable and improved  Diet recommendation: Regular  Filed Weights   08/05/18 1913  Weight: 74.6 kg    History of present illness:  Per Dr. Colman Cater Farmer  is a 83 y.o. male, with history of hypertension, hyperlipidemia, GERD, restless leg syndrome who was brought to the ED by his wife with acute onset of confusion with visual and auditory hallucinations.  Associated symptoms included generalized weakness and unsteady gait.  Patient reported having chronic nonproductive cough but yesterday started having sudden pain in his left eye and left ear.  He also had an episode of nonbloody vomiting.  Denied back pain, dysuria or diarrhea.  Denied chest pain, shortness of breath, orthopnea, abdominal pain, palpitations, tingling or numbness of extremities.  Did not record temperature at home.  Denied any recent travel.  Wife reported having URI symptoms last week.  Denied any new medications.  Patient reported pain in his left shoulders which was chronic.  Vitals in the ED showed temperature of 100.2 F, respiratory rate of 21,  blood pressure 96/49 mmHg, O2 sat >95% on room air.  Blood work showed WBC of 30.7K, hemoglobin of 14, BUN of 27 creatinine of 1.97 (baseline creatinine of 1), glucose of 119.  Lactic acid was normal.  Chest x-ray showed left lower lobe pneumonia.  Head CT was unremarkable for acute findings. Patient met criteria for early sepsis and sepsis pathway initiated.  Blood culture and flu PCR was sent.  Patient given empiric IV Rocephin and azithromycin along with 2 L normal saline bolus and blood pressure slowly improved. In the ED, he was confused and was not fully oriented but after receiving IV fluids mental status started improving to baseline. Hospitalist consulted for admission to early sepsis/Sirs secondary to left lobar pneumonia.  Hospital Course:  1 sepsis secondary to pneumonia and Streptococcus pneumonia bacteremia Patient presented with altered mental status, tachycardia, temperature of 100.2 with borderline blood pressure.  Chest x-ray concerning for left upper lobe pneumonia.  Urine strep pneumococcus antigen was positive.  Urine Legionella antigen pending.  Blood cultures positive for strep pneumonia which likely seeded from patient's lung infection.  Patient was placed on IV fluids as well as IV Rocephin.  On admission patient noted to have a leukocytosis with a white count of 30.7.  Patient placed on IV fluids and followed.  Patient remained afebrile.  Leukocytosis trended down and had resolved by day of discharge.  Patient improved clinically did not have any further hallucinations.  Patient was also maintained on Atrovent and Xopenex nebs, Claritin, Mucinex, Flonase and PPI.  Patient seen in consultation by ID who recommended a total  of 10 days of antibiotics.  Patient was maintained on IV antibiotics during the hospitalization will be discharged home on Keflex 500 mg p.o. 4 times daily x6 more days to complete a 10-day course of antibiotic treatment.  Outpatient follow-up with PCP.   2.   Acute metabolic encephalopathy Patient with visual and auditory hallucinations on admission and on first hospital day.  Hallucinations improved with antibiotic treatment of pneumonia and bacteremia.  CT head which was done was negative.  Hallucinations had resolved by day of discharge and patient was back to baseline.  Outpatient follow-up.  3.  Gastroesophageal reflux disease Continued on a PPI.  4.  Acute renal failure Likely secondary to a prerenal azotemia.  Patient noted to have borderline blood pressure on admission.  Renal function has improved with hydration and acute renal failure had resolved by day of discharge.   5.  Hypertension Patient noted to be hypotensive on admission and as such antihypertensive medications were held during the hospitalization.  Blood pressure improved with hydration.  IV fluids saline lock.    6.  Hyperlipidemia Patient maintained on a statin.  7.  Loose stools Likely antibiotic induced.  Patient afebrile.  WBC has trended down.  Per ID doubt if C. difficile colitis and no further work-up needed.    Patient placed on Imodium as needed.  Outpatient follow-up.    Procedures:  CT head without contrast 08/04/2018  Chest x-ray 08/04/2018  Consultations:  ID: Dr. Baxter Farmer 08/06/2018  Discharge Exam: Vitals:   08/08/18 0407 08/08/18 1252  BP: 140/85 (!) 153/90  Pulse: 84 84  Resp: 18 16  Temp: 97.9 F (36.6 C)   SpO2: 95% 97%    General: NAD Cardiovascular: RRR Respiratory: Decreased coarse breath sounds on the left.  No wheezing.  No crackles.  Discharge Instructions   Discharge Instructions    Diet general   Complete by:  As directed    Increase activity slowly   Complete by:  As directed      Allergies as of 08/08/2018      Reactions   Statins Other (See Comments)   Muscle cramps      Medication List    TAKE these medications   albuterol 108 (90 Base) MCG/ACT inhaler Commonly known as:  PROVENTIL HFA;VENTOLIN  HFA Inhale 2 puffs into the lungs 3 (three) times daily. Use 2 puffs 3 times daily x 5 days, then every 6 hours as needed.   aspirin EC 81 MG tablet Take 81 mg by mouth daily.   atenolol 25 MG tablet Commonly known as:  TENORMIN TAKE (1/2) TABLET DAILY.   cephALEXin 500 MG capsule Commonly known as:  KEFLEX Take 1 capsule (500 mg total) by mouth 4 (four) times daily for 6 days. Start taking on:  August 09, 2018   fluticasone 50 MCG/ACT nasal spray Commonly known as:  FLONASE Place 2 sprays into both nostrils daily.   guaiFENesin 600 MG 12 hr tablet Commonly known as:  MUCINEX Take 2 tablets (1,200 mg total) by mouth 2 (two) times daily for 5 days.   ipratropium 0.03 % nasal spray Commonly known as:  ATROVENT Place 2 sprays into both nostrils 3 (three) times daily as needed for rhinitis.   latanoprost 0.005 % ophthalmic solution Commonly known as:  XALATAN Place 1 drop into both eyes at bedtime.   LEG CRAMPS PM SL Place 3 tablets under the tongue every evening.   levocetirizine 5 MG tablet Commonly known as:  XYZAL Take  5 mg by mouth every evening.   loperamide 2 MG capsule Commonly known as:  IMODIUM Take 1 capsule (2 mg total) by mouth as needed for diarrhea or loose stools.   omeprazole 20 MG capsule Commonly known as:  PRILOSEC TAKE (1) CAPSULE DAILY.   simvastatin 10 MG tablet Commonly known as:  ZOCOR Take 1 tablet (10 mg total) by mouth at bedtime. What changed:  when to take this   Vitamin D3 25 MCG (1000 UT) Caps Take 1 capsule by mouth daily.      Allergies  Allergen Reactions  . Statins Other (See Comments)    Muscle cramps    Follow-up Information    Nafziger, Tommi Rumps, NP Follow up in 2 week(s).   Specialty:  Family Medicine Why:  f/u in 1-2 weeks. Contact information: [address] [place] [address] [phone]            The results of significant diagnostics from this hospitalization (including imaging,  microbiology, ancillary and laboratory) are listed below for reference.    Significant Diagnostic Studies: Dg Chest 2 View  Result Date: 08/05/2018 CLINICAL DATA:  Shortness of breath. EXAM: CHEST - 2 VIEW COMPARISON:  Radiographs of August 04, 2018. FINDINGS: The heart size and mediastinal contours are within normal limits. No pneumothorax or pleural effusion is noted. Stable left upper lobe airspace opacity is noted concerning for pneumonia. Scarring is noted throughout both lungs. The visualized skeletal structures are unremarkable. IMPRESSION: Stable left upper lobe airspace opacity is noted consistent with pneumonia. Followup PA and lateral chest X-ray is recommended in 3-4 weeks following trial of antibiotic therapy to ensure resolution and exclude underlying malignancy. Electronically Signed   By: Marijo Conception, M.D.   On: 08/05/2018 17:22   Dg Chest 2 View  Result Date: 08/04/2018 CLINICAL DATA:  Cough. EXAM: CHEST - 2 VIEW COMPARISON:  Radiographs of November 27, 2016. FINDINGS: The heart size and mediastinal contours are within normal limits. No pneumothorax or pleural effusion is noted. Stable probable scarring is seen throughout both lungs. Increased airspace opacity is noted in left upper lobe consistent with pneumonia. The visualized skeletal structures are unremarkable. IMPRESSION: Left upper lobe pneumonia. Followup PA and lateral chest X-ray is recommended in 3-4 weeks following trial of antibiotic therapy to ensure resolution and exclude underlying malignancy. Electronically Signed   By: Marijo Conception, M.D.   On: 08/04/2018 07:43   Ct Head Wo Contrast  Result Date: 08/04/2018 CLINICAL DATA:  Dizziness, hallucinations. EXAM: CT HEAD WITHOUT CONTRAST TECHNIQUE: Contiguous axial images were obtained from the base of the skull through the vertex without intravenous contrast. COMPARISON:  MRI of May 16, 2015.  CT scan of April 24, 2015. FINDINGS: Brain: Mild diffuse cortical  atrophy is noted. Mild chronic ischemic white matter disease is noted. Stable probable calcified meningioma seen in left posterior fossa. No acute hemorrhage or infarction is noted. No mass effect or midline shift is noted. Ventricular size is within normal limits. Vascular: No hyperdense vessel or unexpected calcification. Skull: Normal. Negative for fracture or focal lesion. Sinuses/Orbits: No acute finding. Other: None. IMPRESSION: Mild diffuse cortical atrophy. Mild chronic ischemic white matter disease. Stable probable calcified meningioma seen in left posterior fossa. No acute intracranial abnormality seen. Electronically Signed   By: Marijo Conception, M.D.   On: 08/04/2018 07:49    Microbiology: Recent Results (from the past 240 hour(s))  Blood culture (routine x 2)     Status: Abnormal  Collection Time: 08/04/18  8:39 AM  Result Value Ref Range Status   Specimen Description   Final    BLOOD RIGHT ANTECUBITAL Performed at [facility] [address], [place], [place] [address]    Special Requests   Final    BOTTLES DRAWN AEROBIC AND ANAEROBIC Blood Culture adequate volume Performed at [facility] [address] [place], [place] [address]    Culture  Setup Time   Final    GRAM POSITIVE COCCI AEROBIC BOTTLE ONLY CRITICAL RESULT CALLED TO, READ BACK BY AND VERIFIED WITH: L POINDEXTER PHARMD 0201 08/05/18 A BROWNING Performed at [facility], [place] [address] [place], [place] [address]    Culture STREPTOCOCCUS PNEUMONIAE (A)  Final   Report Status 08/07/2018 FINAL  Final   Organism ID, Bacteria STREPTOCOCCUS PNEUMONIAE  Final      Susceptibility   Streptococcus pneumoniae - MIC*    ERYTHROMYCIN >=8 RESISTANT Resistant     LEVOFLOXACIN 1 SENSITIVE Sensitive     VANCOMYCIN 0.5 SENSITIVE Sensitive     PENICILLIN (meningitis) <=0.06 SENSITIVE Sensitive     PENICILLIN (non-meningitis) <=0.06 SENSITIVE Sensitive     CEFTRIAXONE  (non-meningitis) <=0.12 SENSITIVE Sensitive     CEFTRIAXONE (meningitis) <=0.12 SENSITIVE Sensitive     * STREPTOCOCCUS PNEUMONIAE  Blood Culture ID Panel (Reflexed)     Status: Abnormal   Collection Time: 08/04/18  8:39 AM  Result Value Ref Range Status   Enterococcus species NOT DETECTED NOT DETECTED Final   Listeria monocytogenes NOT DETECTED NOT DETECTED Final   Staphylococcus species NOT DETECTED NOT DETECTED Final   Staphylococcus aureus (BCID) NOT DETECTED NOT DETECTED Final   Streptococcus species DETECTED (A) NOT DETECTED Final    Comment: CRITICAL RESULT CALLED TO, READ BACK BY AND VERIFIED WITH: L POINDEXTER PHARMD 0201 08/05/18 A BROWNING    Streptococcus agalactiae NOT DETECTED NOT DETECTED Final   Streptococcus pneumoniae DETECTED (A) NOT DETECTED Final    Comment: CRITICAL RESULT CALLED TO, READ BACK BY AND VERIFIED WITH: L POINDEXTER PHARMD 0201 08/05/18 A BROWNING    Streptococcus pyogenes NOT DETECTED NOT DETECTED Final   Acinetobacter baumannii NOT DETECTED NOT DETECTED Final   Enterobacteriaceae species NOT DETECTED NOT DETECTED Final   Enterobacter cloacae complex NOT DETECTED NOT DETECTED Final   Escherichia coli NOT DETECTED NOT DETECTED Final   Klebsiella oxytoca NOT DETECTED NOT DETECTED Final   Klebsiella pneumoniae NOT DETECTED NOT DETECTED Final   Proteus species NOT DETECTED NOT DETECTED Final   Serratia marcescens NOT DETECTED NOT DETECTED Final   Haemophilus influenzae NOT DETECTED NOT DETECTED Final   Neisseria meningitidis NOT DETECTED NOT DETECTED Final   Pseudomonas aeruginosa NOT DETECTED NOT DETECTED Final   Candida albicans NOT DETECTED NOT DETECTED Final   Candida glabrata NOT DETECTED NOT DETECTED Final   Candida krusei NOT DETECTED NOT DETECTED Final   Candida parapsilosis NOT DETECTED NOT DETECTED Final   Candida tropicalis NOT DETECTED NOT DETECTED Final    Comment: Performed at [facility], [address], [place], [place]  [address]  Blood culture (routine x 2)     Status: Abnormal   Collection Time: 08/04/18  9:00 AM  Result Value Ref Range Status   Specimen Description   Final    BLOOD LEFT ANTECUBITAL Performed at [facility] [address] [place], [place] [address]    Special Requests   Final    BOTTLES DRAWN AEROBIC AND ANAEROBIC Blood  Culture adequate volume Performed at [facility] [address], [place], [place] [address]    Culture  Setup Time   Final    GRAM POSITIVE COCCI AEROBIC BOTTLE ONLY CRITICAL VALUE NOTED.  VALUE IS CONSISTENT WITH PREVIOUSLY REPORTED AND CALLED VALUE.    Culture (A)  Final    STREPTOCOCCUS PNEUMONIAE SUSCEPTIBILITIES PERFORMED ON PREVIOUS CULTURE WITHIN THE LAST 5 DAYS. Performed at [facility], [place] [address], [place], [place] [address]    Report Status 08/07/2018 FINAL  Final  Culture, blood (routine x 2)     Status: None (Preliminary result)   Collection Time: 08/06/18  3:45 PM  Result Value Ref Range Status   Specimen Description   Final    BLOOD LEFT ARM Performed at [facility] [address] [place], [place] [address]    Special Requests   Final    BOTTLES DRAWN AEROBIC ONLY Blood Culture results may not be optimal due to an inadequate volume of blood received in culture bottles Performed at [facility] [address], [place], [place] [address]    Culture   Final    NO GROWTH < 24 HOURS Performed at [facility] [address] [place], [place] [address]    Report Status PENDING  Incomplete  Culture, blood (routine x 2)     Status: None (Preliminary result)   Collection Time: 08/06/18  3:45 PM  Result Value Ref Range Status   Specimen Description   Final    BLOOD LEFT HAND Performed at [facility] [address] [place], [place] [address]    Special Requests   Final    BOTTLES DRAWN AEROBIC ONLY Blood Culture adequate  volume Performed at [facility] [address], [place], [place] [address]    Culture   Final    NO GROWTH < 24 HOURS Performed at [facility] [address], [place],  [address]    Report Status PENDING  Incomplete     Labs: Basic Metabolic Panel: Recent Labs  Lab 08/04/18 0652 08/05/18 0341 08/06/18 0427 08/07/18 0522 08/08/18 0545  NA 136 137 139 138 138  K 4.3 3.8 3.9 4.1 3.7  CL 102 106 109 108 107  CO2 23 21* 21* 21* 21*  GLUCOSE 119* 112* 97 94 92  BUN 27* 28* 20 12 13   CREATININE 1.97* 1.39* 1.21 1.00 0.95  CALCIUM 8.7* 8.7* 8.2* 8.4* 8.6*  MG  --   --  2.1  --   --    Liver Function Tests: Recent Labs  Lab 08/04/18 0652  AST 16  ALT 13  ALKPHOS 44  BILITOT 1.1  PROT 6.5  ALBUMIN 3.5   No results for input(s): LIPASE, AMYLASE in the last 168 hours. Recent Labs  Lab 08/04/18 0653  AMMONIA 17   CBC: Recent Labs  Lab 08/04/18 0652 08/05/18 0341 08/06/18 0427 08/07/18 0720 08/08/18 0545  WBC 30.7* 20.1* 7.4 6.0 7.0  NEUTROABS  --   --  4.9 3.9  --   HGB 14.1 13.4 11.6* 12.3* 12.4*  HCT 42.8 41.1 36.5* 38.8* 38.6*  MCV 93.7 92.8 93.4 92.2 93.0  PLT 224 268 265 303 315   Cardiac Enzymes: No results for input(s): CKTOTAL, CKMB, CKMBINDEX, TROPONINI in the last 168 hours. BNP: BNP (last 3 results) No results for input(s): BNP in the last 8760 hours.  ProBNP (last 3 results) No results for  input(s): PROBNP in the last 8760 hours.  CBG: Recent Labs  Lab 08/06/18 0742 08/07/18 0742 08/08/18 0802  GLUCAP 87 80 88       Signed:  Irine Seal MD.  [facility] 08/08/2018, 3:07 PM

## 2018-08-08 NOTE — Progress Notes (Signed)
Pt ambulating in the hallway, tolerating well. Jason Farmer had 2 episodes of loose watery stools. Discharge instructions explained to patient and his wife. Prescriptions given to wife. Discharged to wife via wheelchair.

## 2018-08-10 ENCOUNTER — Telehealth: Payer: Self-pay

## 2018-08-10 NOTE — Telephone Encounter (Signed)
Transition Care Management Follow-up Telephone Call   Date discharged? 08/08/18   How have you been since you were released from the hospital? "still under the weather. It's going to take a bit but I'm ok."   Do you understand why you were in the hospital? yes   Do you understand the discharge instructions? yes   Where were you discharged to? Home   Items Reviewed:  Medications reviewed: yes  Allergies reviewed: yes  Dietary changes reviewed: yes  Referrals reviewed: no   Functional Questionnaire:   Activities of Daily Living (ADLs):   He states they are independent in the following: ambulation, bathing and hygiene, feeding, continence, grooming, toileting and dressing States they require assistance with the following: none   Any transportation issues/concerns?: no   Any patient concerns? no   Confirmed importance and date/time of follow-up visits scheduled yes  Provider Appointment booked with Dorothyann Peng, 08/06/18  Confirmed with patient if condition begins to worsen call PCP or go to the ER.  Patient was given the office number and encouraged to call back with question or concerns.  : yes

## 2018-08-11 LAB — CULTURE, BLOOD (ROUTINE X 2)
Culture: NO GROWTH
Culture: NO GROWTH
Special Requests: ADEQUATE

## 2018-08-24 ENCOUNTER — Telehealth: Payer: Self-pay | Admitting: Adult Health

## 2018-08-24 ENCOUNTER — Other Ambulatory Visit: Payer: Medicare Other

## 2018-08-24 ENCOUNTER — Ambulatory Visit (INDEPENDENT_AMBULATORY_CARE_PROVIDER_SITE_OTHER): Payer: Medicare Other | Admitting: Adult Health

## 2018-08-24 ENCOUNTER — Ambulatory Visit (INDEPENDENT_AMBULATORY_CARE_PROVIDER_SITE_OTHER): Payer: Medicare Other

## 2018-08-24 ENCOUNTER — Encounter: Payer: Self-pay | Admitting: Adult Health

## 2018-08-24 VITALS — BP 124/70 | HR 75 | Temp 97.8°F | Wt 151.0 lb

## 2018-08-24 DIAGNOSIS — J189 Pneumonia, unspecified organism: Secondary | ICD-10-CM

## 2018-08-24 DIAGNOSIS — A419 Sepsis, unspecified organism: Secondary | ICD-10-CM

## 2018-08-24 DIAGNOSIS — J329 Chronic sinusitis, unspecified: Secondary | ICD-10-CM | POA: Diagnosis not present

## 2018-08-24 DIAGNOSIS — J181 Lobar pneumonia, unspecified organism: Principal | ICD-10-CM

## 2018-08-24 DIAGNOSIS — R110 Nausea: Secondary | ICD-10-CM | POA: Diagnosis not present

## 2018-08-24 DIAGNOSIS — J90 Pleural effusion, not elsewhere classified: Secondary | ICD-10-CM | POA: Diagnosis not present

## 2018-08-24 LAB — CBC WITH DIFFERENTIAL/PLATELET
Basophils Absolute: 0 10*3/uL (ref 0.0–0.1)
Basophils Relative: 0.4 % (ref 0.0–3.0)
Eosinophils Absolute: 0.1 10*3/uL (ref 0.0–0.7)
Eosinophils Relative: 1.2 % (ref 0.0–5.0)
HCT: 43.1 % (ref 39.0–52.0)
Hemoglobin: 14.4 g/dL (ref 13.0–17.0)
Lymphocytes Relative: 24.9 % (ref 12.0–46.0)
Lymphs Abs: 1.9 10*3/uL (ref 0.7–4.0)
MCHC: 33.3 g/dL (ref 30.0–36.0)
MCV: 91.6 fl (ref 78.0–100.0)
Monocytes Absolute: 0.5 10*3/uL (ref 0.1–1.0)
Monocytes Relative: 6.7 % (ref 3.0–12.0)
Neutro Abs: 5.2 10*3/uL (ref 1.4–7.7)
Neutrophils Relative %: 66.8 % (ref 43.0–77.0)
Platelets: 408 10*3/uL — ABNORMAL HIGH (ref 150.0–400.0)
RBC: 4.71 Mil/uL (ref 4.22–5.81)
RDW: 13.2 % (ref 11.5–15.5)
WBC: 7.8 10*3/uL (ref 4.0–10.5)

## 2018-08-24 LAB — BASIC METABOLIC PANEL
BUN: 16 mg/dL (ref 6–23)
CO2: 28 mEq/L (ref 19–32)
Calcium: 9.4 mg/dL (ref 8.4–10.5)
Chloride: 103 mEq/L (ref 96–112)
Creatinine, Ser: 1.19 mg/dL (ref 0.40–1.50)
GFR: 62.31 mL/min (ref >60.00)
Glucose, Bld: 108 mg/dL — ABNORMAL HIGH (ref 70–99)
Potassium: 5.2 mEq/L — ABNORMAL HIGH (ref 3.5–5.1)
Sodium: 137 mEq/L (ref 135–145)

## 2018-08-24 MED ORDER — AMOXICILLIN-POT CLAVULANATE 875-125 MG PO TABS: 1.0000 | ORAL_TABLET | Freq: Two times a day (BID) | ORAL | 0 refills | Status: DC

## 2018-08-24 MED ORDER — ONDANSETRON HCL 4 MG PO TABS: 4.0000 mg | ORAL_TABLET | Freq: Three times a day (TID) | ORAL | 1 refills | Status: DC | PRN

## 2018-08-24 NOTE — Telephone Encounter (Signed)
Spoke to patients wife and informed her of chest xray. Chest xray continues to show pneumonia but is resolving. Will place of Augmentin due to positive for Strep Pneumo.   Repeat chest xray in 2-3 weeks  Return precautions given

## 2018-08-24 NOTE — Progress Notes (Signed)
Subjective:    Patient ID: Jason Farmer, male    DOB: [birth year], 83 y.o.   MRN: [id number]  HPI 83 year old male who  has a past medical history of Anxiety, Arthritis, Chronic lower back pain, Essential tremor, GERD (gastroesophageal reflux disease), Glaucoma, Heart murmur, Hepatitis (~ 1958), HOH (hard of hearing), Hyperlipemia, Migraine, Seasonal allergies, TIA (transient ischemic attack) (04/2015), Wears glasses, and Wears hearing aid.  He presents to the office today for TCM visit  Admit Date: 08/04/2018 Discharge Date:08/08/2018  Per hospital note, patient was brought to the ED by his wife with acute onset of confusion and visual and auditory hallucinations.  Associated symptoms included generalized weakness and unsteady gait.  In the ER the patient reported having chronic nonproductive cough but the day prior to being evaluated he had sudden pain in his left eye and left ear.  He had a single episode of vomiting. He denied chest pain, shortness of breath, abdominal pain, palpitations, tingling or numbness of extremities.  In the  ER vitals showed a temperature of 100.2 F, RR 21, blood pressure 96/49, and oxygen saturation of greater than 95% on room air.Blood work was remarkable for  white blood cell count of 30.7K, S x-ray showed left lower lobe pneumonia.  Head CT was unremarkable for acute findings.Patient was started on empiric IV Rocephin and azithromycin as well as 2 L normal saline bolus, blood pressure slowly improved.  Was admitted for sepsis work-up.   Blood cultures were positive for strep pneumonia  On discharge leukocytosis had resolved and was discharged home on Keflex 500 mg 4 times a day for a 10-day course.  He reports that he is slowly recovering his strength. He appetite is good. He denies any fevers or shortness of breath. He does report episodes of nausea but feels as though this is caused from chronic rhinorrhea and PND- See below  On a side note, he  continues to have chronic rhinorrhea with clear nasal drainage, PND, and episodes of sinus pressure. He has tried many OTC medication such as flonase, Claritin and zyrtec.    Review of Systems  Constitutional: Positive for fatigue. Negative for activity change, appetite change and fever.  HENT: Positive for postnasal drip, rhinorrhea and sinus pressure. Negative for ear discharge, ear pain, nosebleeds, sore throat and trouble swallowing.   Eyes: Negative.   Respiratory: Negative.   Cardiovascular: Negative.   Gastrointestinal: Positive for nausea. Negative for blood in stool, constipation and vomiting.  Endocrine: Negative.   Genitourinary: Negative.   Musculoskeletal: Negative.   Skin: Negative.   Allergic/Immunologic: Negative.   Neurological: Negative.   Hematological: Negative.   Psychiatric/Behavioral: Negative.   All other systems reviewed and are negative.  Past Medical History:  Diagnosis Date  . Anxiety    "not a problem for a long time" (11/27/2016)  . Arthritis    "wrists, knees, shoulders, back" (11/27/2016)  . Chronic lower back pain    "5th disc is protruding" (11/27/2016)  . Essential tremor   . GERD (gastroesophageal reflux disease)   . Glaucoma   . Heart murmur    dx'd in [employer] in the 1960s; haven't been seen for it since "(11/27/2016)  . Hepatitis ~ 1958   "jaundice kind"  . HOH (hard of hearing)   . Hyperlipemia   . Migraine    "none since my neck OR" (11/27/2016)  . Seasonal allergies   . TIA (transient ischemic attack) 04/2015  . Wears glasses   . Wears  hearing aid     Social History   Socioeconomic History  . Marital status: Married    Spouse name: Not on file  . Number of children: Not on file  . Years of education: Not on file  . Highest education level: Not on file  Occupational History  . Not on file  Social Needs  . Financial resource strain: Not on file  . Food insecurity:    Worry: Not on file    Inability: Not on file  . Transportation  needs:    Medical: Not on file    Non-medical: Not on file  Tobacco Use  . Smoking status: Former Smoker    Packs/day: 2.00    Years: 30.00    Pack years: 60.00    Types: Cigarettes    Last attempt to quit: 06/03/1984    Years since quitting: 34.2  . Smokeless tobacco: Never Used  . Tobacco comment: had AAA at the [facility]; also noted 2018 on Dr. Kyla Balzarine note   Substance and Sexual Activity  . Alcohol use: No    Comment: 11/27/2016 "quit in 1985"  . Drug use: No  . Sexual activity: Not Currently  Lifestyle  . Physical activity:    Days per week: Not on file    Minutes per session: Not on file  . Stress: Not on file  Relationships  . Social connections:    Talks on phone: Not on file    Gets together: Not on file    Attends religious service: Not on file    Active member of club or organization: Not on file    Attends meetings of clubs or organizations: Not on file    Relationship status: Not on file  . Intimate partner violence:    Fear of current or ex partner: Not on file    Emotionally abused: Not on file    Physically abused: Not on file    Forced sexual activity: Not on file  Other Topics Concern  . Not on file  Social History Narrative   Retired from [employer]   Married    4 children, one son lives locally    Past Surgical History:  Procedure Laterality Date  . ANTERIOR CERVICAL DECOMP/DISCECTOMY FUSION  2007  . APPENDECTOMY    . BACK SURGERY    . CARPAL TUNNEL RELEASE Right 10/12/2013   Procedure: RIGHT CARPAL TUNNEL RELEASE;  Surgeon: Wynonia Sours, MD;  Location: [place];  Service: Orthopedics;  Laterality: Right;  . CATARACT EXTRACTION W/ INTRAOCULAR LENS  IMPLANT, BILATERAL Bilateral   . COLONOSCOPY    . HEMORRHOID SURGERY    . KNEE ARTHROSCOPY Left 1970s  . NASAL SEPTOPLASTY W/ TURBINOPLASTY Bilateral 02/15/2013   Procedure: NASAL SEPTOPLASTY WITH BILATER TURBINATE REDUCTION;  Surgeon: Ascencion Dike, MD;  Location: [place];  Service: ENT;  Laterality: Bilateral;  . TONSILLECTOMY    . TRIGGER FINGER RELEASE  06/08/2012   Procedure: RELEASE TRIGGER FINGER/A-1 PULLEY;  Surgeon: Wynonia Sours, MD;  Location: [place];  Service: Orthopedics;  Laterality: Left;  Release A-1 Pulley Left Long Finger  . TRIGGER FINGER RELEASE  07/14/2012   Procedure: RELEASE TRIGGER FINGER/A-1 PULLEY;  Surgeon: Wynonia Sours, MD;  Location: [place];  Service: Orthopedics;  Laterality: Right;  . TRIGGER FINGER RELEASE Right 10/12/2013   Procedure: RELEASE TRIGGER FINGER/A-1 PULLEY RIGHT MIDDLE FINGER;  Surgeon: Wynonia Sours, MD;  Location: [place];  Service: Orthopedics;  Laterality: Right;  . UPPER GASTROINTESTINAL ENDOSCOPY      Family History  Problem Relation Age of Onset  . Dementia Father   . Heart failure Father   . Glaucoma Mother   . Colon cancer Neg Hx     Allergies  Allergen Reactions  . Statins Other (See Comments)    Muscle cramps     Current Outpatient Medications on File Prior to Visit  Medication Sig Dispense Refill  . aspirin EC 81 MG tablet Take 81 mg by mouth daily.    Marland Kitchen atenolol (TENORMIN) 25 MG tablet TAKE (1/2) TABLET DAILY. 45 tablet 2  . Cholecalciferol (VITAMIN D3) 25 MCG (1000 UT) CAPS Take 1 capsule by mouth daily.    . fluticasone (FLONASE) 50 MCG/ACT nasal spray Place 2 sprays into both nostrils daily. 16 g 0  . ipratropium (ATROVENT) 0.03 % nasal spray Place 2 sprays into both nostrils 3 (three) times daily as needed for rhinitis.    Marland Kitchen latanoprost (XALATAN) 0.005 % ophthalmic solution Place 1 drop into both eyes at bedtime.     Marland Kitchen levocetirizine (XYZAL) 5 MG tablet Take 5 mg by mouth every evening.    . loperamide (IMODIUM) 2 MG capsule Take 1 capsule (2 mg total) by mouth as needed for diarrhea or loose stools. 15 capsule 0  . omeprazole (PRILOSEC) 20 MG capsule TAKE (1) CAPSULE DAILY. 90 capsule 3  . simvastatin (ZOCOR) 10 MG tablet Take  1 tablet (10 mg total) by mouth at bedtime. (Patient taking differently: Take 10 mg by mouth every other day. ) 90 tablet 3   No current facility-administered medications on file prior to visit.     BP 124/70   Pulse 75   Temp 97.8 F (36.6 C)   Wt 151 lb (68.5 kg)   SpO2 98%   BMI 22.96 kg/m       Objective:   Physical Exam Vitals signs and nursing note reviewed.  Constitutional:      Appearance: Normal appearance. He is normal weight.  HENT:     Head: Normocephalic and atraumatic.     Right Ear: Tympanic membrane, ear canal and external ear normal. There is no impacted cerumen.     Left Ear: Tympanic membrane, ear canal and external ear normal. There is no impacted cerumen.     Nose: Rhinorrhea present.     Comments: + PND ( clear)     Mouth/Throat:     Mouth: Mucous membranes are moist.     Pharynx: Oropharynx is clear.  Eyes:     Extraocular Movements: Extraocular movements intact.     Pupils: Pupils are equal, round, and reactive to light.  Cardiovascular:     Rate and Rhythm: Normal rate and regular rhythm.     Pulses: Normal pulses.     Heart sounds: Normal heart sounds.  Pulmonary:     Effort: Pulmonary effort is normal.     Breath sounds: Normal breath sounds.  Skin:    General: Skin is warm and dry.  Neurological:     General: No focal deficit present.     Mental Status: He is alert and oriented to person, place, and time. Mental status is at baseline.  Psychiatric:        Mood and Affect: Mood normal.        Behavior: Behavior normal.        Thought Content: Thought content normal.        Judgment: Judgment  normal.       Assessment & Plan:  1. Sepsis due to pneumonia [facility]) -Reviewed hospital notes, discharge instructions, imaging, and labs with the patient and his wife.  All questions answered.  He seems to be improving.  Continue to rest and hydration.  Will recheck CBC, BMP and chest x-ray - CBC with Differential/Platelet - Basic Metabolic  Panel - DG Chest 2 View; Future  2. Chronic sinusitis, unspecified location -Have him trial over-the-counter Allegra - Ambulatory referral to ENT  3. Nausea  - ondansetron (ZOFRAN) 4 MG tablet; Take 1 tablet (4 mg total) by mouth every 8 (eight) hours as needed for nausea or vomiting.  Dispense: 20 tablet; Refill: 1  Dorothyann Peng, NP

## 2018-08-24 NOTE — Patient Instructions (Signed)
It was great seeing you today   I am glad you are feeling better   Someone from ENT will call you to schedule your appointment   Please stop by the lab and get your xray and blood work done

## 2018-09-02 DIAGNOSIS — M25811 Other specified joint disorders, right shoulder: Secondary | ICD-10-CM | POA: Diagnosis not present

## 2018-09-02 DIAGNOSIS — M25812 Other specified joint disorders, left shoulder: Secondary | ICD-10-CM | POA: Diagnosis not present

## 2018-09-02 DIAGNOSIS — M7541 Impingement syndrome of right shoulder: Secondary | ICD-10-CM | POA: Diagnosis not present

## 2018-09-02 DIAGNOSIS — M7542 Impingement syndrome of left shoulder: Secondary | ICD-10-CM | POA: Diagnosis not present

## 2018-09-03 DIAGNOSIS — J310 Chronic rhinitis: Secondary | ICD-10-CM | POA: Diagnosis not present

## 2018-09-07 ENCOUNTER — Telehealth: Payer: Self-pay | Admitting: Adult Health

## 2018-09-07 NOTE — Telephone Encounter (Signed)
Copied from [place] [id number]. Topic: General - Inquiry >> Sep 03, 2018  1:54 PM Conception Chancy, NT wrote: Reason for CRM: Dr. Lucia Gaskins office is calling and states that she is needing a copy of the patients medicare a & b insurance card faxed to them as the patient states he does not have a insurance card.   Fax# [phone] attention Marlowe Kays >> Sep 07, 2018  8:03 AM Carney Bern wrote: Referral  Insurance card fax to  [phone] attention Marlowe Kays

## 2018-12-28 ENCOUNTER — Ambulatory Visit: Payer: Medicare Other

## 2019-01-04 DIAGNOSIS — M12812 Other specific arthropathies, not elsewhere classified, left shoulder: Secondary | ICD-10-CM | POA: Insufficient documentation

## 2019-01-04 DIAGNOSIS — M12811 Other specific arthropathies, not elsewhere classified, right shoulder: Secondary | ICD-10-CM | POA: Insufficient documentation

## 2019-01-05 DIAGNOSIS — M25811 Other specified joint disorders, right shoulder: Secondary | ICD-10-CM | POA: Diagnosis not present

## 2019-01-05 DIAGNOSIS — M25812 Other specified joint disorders, left shoulder: Secondary | ICD-10-CM | POA: Diagnosis not present

## 2019-01-13 DIAGNOSIS — H401131 Primary open-angle glaucoma, bilateral, mild stage: Secondary | ICD-10-CM | POA: Diagnosis not present

## 2019-01-13 DIAGNOSIS — H26492 Other secondary cataract, left eye: Secondary | ICD-10-CM | POA: Diagnosis not present

## 2019-01-21 ENCOUNTER — Other Ambulatory Visit: Payer: Self-pay

## 2019-01-21 ENCOUNTER — Ambulatory Visit (INDEPENDENT_AMBULATORY_CARE_PROVIDER_SITE_OTHER): Payer: Medicare Other | Admitting: Adult Health

## 2019-01-21 ENCOUNTER — Encounter: Payer: Self-pay | Admitting: Adult Health

## 2019-01-21 VITALS — BP 130/70 | Temp 98.5°F | Wt 146.0 lb

## 2019-01-21 DIAGNOSIS — J302 Other seasonal allergic rhinitis: Secondary | ICD-10-CM

## 2019-01-21 DIAGNOSIS — G43109 Migraine with aura, not intractable, without status migrainosus: Secondary | ICD-10-CM

## 2019-01-21 MED ORDER — AMITRIPTYLINE HCL 25 MG PO TABS: 25.0000 mg | ORAL_TABLET | Freq: Every day | ORAL | 0 refills | Status: DC

## 2019-01-21 MED ORDER — AZELASTINE HCL 0.1 % NA SOLN: 2.0000 | Freq: Two times a day (BID) | NASAL | 12 refills | Status: DC

## 2019-01-21 NOTE — Progress Notes (Signed)
Subjective:    Patient ID: Jason Farmer, male    DOB: Jun 24, 1935, 84 y.o.   MRN: [id number]  HPI 84 year old male who  has a past medical history of Anxiety, Arthritis, Chronic lower back pain, Essential tremor, GERD (gastroesophageal reflux disease), Glaucoma, Heart murmur, Hepatitis (~ 1958), HOH (hard of hearing), Hyperlipemia, Migraine, Seasonal allergies, TIA (transient ischemic attack) (04/2015), Wears glasses, and Wears hearing aid.  He presents to the office today for two chronic issues  1. Ocular Migraines -was seen by his eye doctor approximately a week and a half ago after he had 8 straight days of migraine headache with floaters.  He reports that his eye exam came back normal diagnosed with ocular migraines and advised to follow-up with his PCP.  He reports that he continues to have ocular migraines about 3 to 4 days a week.  The pain aspect has improved but still has flashes of light seem to be worse in the left eye.  Has a history of these types of migraine headaches but until recently they were very infrequent.  CT head in Dec 2019 showed: IMPRESSION: Mild diffuse cortical atrophy. Mild chronic ischemic white matter disease. Stable probable calcified meningioma seen in left posterior fossa. No acute intracranial abnormality seen.  2.  Chronic rhinorrhea and postnasal drip.  This is been a longstanding issue and has been seen by allergy as well as ear nose and throat.  He has been tried on many medications including Zyrtec, Allegra, Claritin, Flonase, and Atrovent throughout the years and has never found anything that helps with his symptoms.  He does feel as though his symptoms are getting worse as he ages.  Denies sinus pain or pressure but does feel as though his sinuses are "always clogged".  As the day progresses he often becomes nauseous due to the postnasal drip.  Not noticed any fevers or chills  Review of Systems See HPI   Past Medical History:  Diagnosis Date  .  Anxiety    "not a problem for a long time" (11/27/2016)  . Arthritis    "wrists, knees, shoulders, back" (11/27/2016)  . Chronic lower back pain    "5th disc is protruding" (11/27/2016)  . Essential tremor   . GERD (gastroesophageal reflux disease)   . Glaucoma   . Heart murmur    dx'd in [employer] in the 1960s; haven't been seen for it since "(11/27/2016)  . Hepatitis ~ 1958   "jaundice kind"  . HOH (hard of hearing)   . Hyperlipemia   . Migraine    "none since my neck OR" (11/27/2016)  . Seasonal allergies   . TIA (transient ischemic attack) 04/2015  . Wears glasses   . Wears hearing aid     Social History   Socioeconomic History  . Marital status: Married    Spouse name: Not on file  . Number of children: Not on file  . Years of education: Not on file  . Highest education level: Not on file  Occupational History  . Not on file  Social Needs  . Financial resource strain: Not on file  . Food insecurity:    Worry: Not on file    Inability: Not on file  . Transportation needs:    Medical: Not on file    Non-medical: Not on file  Tobacco Use  . Smoking status: Former Smoker    Packs/day: 2.00    Years: 30.00    Pack years: 60.00  Types: Cigarettes    Last attempt to quit: 06/03/1984    Years since quitting: 34.6  . Smokeless tobacco: Never Used  . Tobacco comment: had AAA [facility]; also noted 2018 on Dr. Kyla Balzarine note   Substance and Sexual Activity  . Alcohol use: No    Comment: 11/27/2016 "quit in 1985"  . Drug use: No  . Sexual activity: Not Currently  Lifestyle  . Physical activity:    Days per week: Not on file    Minutes per session: Not on file  . Stress: Not on file  Relationships  . Social connections:    Talks on phone: Not on file    Gets together: Not on file    Attends religious service: Not on file    Active member of club or organization: Not on file    Attends meetings of clubs or organizations: Not on file    Relationship status: Not on file  .  Intimate partner violence:    Fear of current or ex partner: Not on file    Emotionally abused: Not on file    Physically abused: Not on file    Forced sexual activity: Not on file  Other Topics Concern  . Not on file  Social History Narrative   Retired from [employer]   Married    4 children, one son lives locally    Past Surgical History:  Procedure Laterality Date  . ANTERIOR CERVICAL DECOMP/DISCECTOMY FUSION  2007  . APPENDECTOMY    . BACK SURGERY    . CARPAL TUNNEL RELEASE Right 10/12/2013   Procedure: RIGHT CARPAL TUNNEL RELEASE;  Surgeon: Wynonia Sours, MD;  Location: [place];  Service: Orthopedics;  Laterality: Right;  . CATARACT EXTRACTION W/ INTRAOCULAR LENS  IMPLANT, BILATERAL Bilateral   . COLONOSCOPY    . HEMORRHOID SURGERY    . KNEE ARTHROSCOPY Left 1970s  . NASAL SEPTOPLASTY W/ TURBINOPLASTY Bilateral 02/15/2013   Procedure: NASAL SEPTOPLASTY WITH BILATER TURBINATE REDUCTION;  Surgeon: Ascencion Dike, MD;  Location: [place];  Service: ENT;  Laterality: Bilateral;  . TONSILLECTOMY    . TRIGGER FINGER RELEASE  06/08/2012   Procedure: RELEASE TRIGGER FINGER/A-1 PULLEY;  Surgeon: Wynonia Sours, MD;  Location: [place];  Service: Orthopedics;  Laterality: Left;  Release A-1 Pulley Left Long Finger  . TRIGGER FINGER RELEASE  07/14/2012   Procedure: RELEASE TRIGGER FINGER/A-1 PULLEY;  Surgeon: Wynonia Sours, MD;  Location: [place];  Service: Orthopedics;  Laterality: Right;  . TRIGGER FINGER RELEASE Right 10/12/2013   Procedure: RELEASE TRIGGER FINGER/A-1 PULLEY RIGHT MIDDLE FINGER;  Surgeon: Wynonia Sours, MD;  Location: [place];  Service: Orthopedics;  Laterality: Right;  . UPPER GASTROINTESTINAL ENDOSCOPY      Family History  Problem Relation Age of Onset  . Dementia Father   . Heart failure Father   . Glaucoma Mother   . Colon cancer Neg Hx     Allergies  Allergen Reactions  .  Statins Other (See Comments)    Muscle cramps     Current Outpatient Medications on File Prior to Visit  Medication Sig Dispense Refill  . aspirin EC 81 MG tablet Take 81 mg by mouth daily.    Marland Kitchen atenolol (TENORMIN) 25 MG tablet TAKE (1/2) TABLET DAILY. 45 tablet 2  . Cholecalciferol (VITAMIN D3) 25 MCG (1000 UT) CAPS Take 1 capsule by mouth daily.    Marland Kitchen latanoprost (  XALATAN) 0.005 % ophthalmic solution Place 1 drop into both eyes at bedtime.     Marland Kitchen omeprazole (PRILOSEC) 20 MG capsule TAKE (1) CAPSULE DAILY. 90 capsule 3  . simvastatin (ZOCOR) 10 MG tablet Take 1 tablet (10 mg total) by mouth at bedtime. (Patient taking differently: Take 10 mg by mouth every other day. ) 90 tablet 3  . fluticasone (FLONASE) 50 MCG/ACT nasal spray Place 2 sprays into both nostrils daily. (Patient not taking: Reported on 01/21/2019) 16 g 0   No current facility-administered medications on file prior to visit.     BP 130/70   Temp 98.5 F (36.9 C)   Wt 146 lb (66.2 kg)   BMI 22.20 kg/m       Objective:   Physical Exam Vitals signs and nursing note reviewed.  Constitutional:      Appearance: He is well-developed.  HENT:     Head: Normocephalic and atraumatic.     Nose: Nose normal. No congestion or rhinorrhea.     Mouth/Throat:     Mouth: Mucous membranes are moist.     Comments: + PND Eyes:     Extraocular Movements: Extraocular movements intact.     Conjunctiva/sclera: Conjunctivae normal.     Pupils: Pupils are equal, round, and reactive to light.  Cardiovascular:     Pulses: Normal pulses.     Heart sounds: Normal heart sounds.  Pulmonary:     Effort: Pulmonary effort is normal.     Breath sounds: Normal breath sounds.  Skin:    Capillary Refill: Capillary refill takes less than 2 seconds.  Neurological:     General: No focal deficit present.     Mental Status: He is alert and oriented to person, place, and time.  Psychiatric:        Mood and Affect: Mood normal.        Behavior:  Behavior normal.        Thought Content: Thought content normal.        Judgment: Judgment normal.       Assessment & Plan:  1. Ocular migraine -I will him on low-dose amitriptyline nightly.  Due to symptoms we will also get an MRI of the brain.  He was advised to follow-up if no improvement in 2 weeks - amitriptyline (ELAVIL) 25 MG tablet; Take 1 tablet (25 mg total) by mouth at bedtime for 30 days.  Dispense: 30 tablet; Refill: 0 - MR Brain Wo Contrast; Future  2. Seasonal allergic rhinitis, unspecified trigger  - azelastine (ASTELIN) 0.1 % nasal spray; Place 2 sprays into both nostrils 2 (two) times daily. Use in each nostril as directed  Dispense: 30 mL; Refill: 12   Dorothyann Peng, NP

## 2019-02-15 ENCOUNTER — Other Ambulatory Visit: Payer: Self-pay | Admitting: Adult Health

## 2019-02-15 DIAGNOSIS — G43109 Migraine with aura, not intractable, without status migrainosus: Secondary | ICD-10-CM

## 2019-02-16 ENCOUNTER — Ambulatory Visit
Admission: RE | Admit: 2019-02-16 | Discharge: 2019-02-16 | Disposition: A | Discharge status: Home / Self Care | Payer: Medicare Other | Source: Ambulatory Visit | Attending: Adult Health | Admitting: Adult Health

## 2019-02-16 ENCOUNTER — Other Ambulatory Visit: Payer: Self-pay

## 2019-02-16 DIAGNOSIS — D320 Benign neoplasm of cerebral meninges: Secondary | ICD-10-CM | POA: Diagnosis not present

## 2019-02-16 DIAGNOSIS — G43109 Migraine with aura, not intractable, without status migrainosus: Secondary | ICD-10-CM

## 2019-03-24 ENCOUNTER — Other Ambulatory Visit: Payer: Self-pay

## 2019-04-11 DIAGNOSIS — M7542 Impingement syndrome of left shoulder: Secondary | ICD-10-CM | POA: Diagnosis not present

## 2019-04-11 DIAGNOSIS — M7541 Impingement syndrome of right shoulder: Secondary | ICD-10-CM | POA: Diagnosis not present

## 2019-05-09 DIAGNOSIS — Z23 Encounter for immunization: Secondary | ICD-10-CM | POA: Diagnosis not present

## 2019-06-02 DIAGNOSIS — H26491 Other secondary cataract, right eye: Secondary | ICD-10-CM | POA: Diagnosis not present

## 2019-06-02 DIAGNOSIS — Z961 Presence of intraocular lens: Secondary | ICD-10-CM | POA: Diagnosis not present

## 2019-06-02 DIAGNOSIS — H401131 Primary open-angle glaucoma, bilateral, mild stage: Secondary | ICD-10-CM | POA: Diagnosis not present

## 2019-06-02 DIAGNOSIS — H52203 Unspecified astigmatism, bilateral: Secondary | ICD-10-CM | POA: Diagnosis not present

## 2019-06-20 ENCOUNTER — Other Ambulatory Visit: Payer: Self-pay

## 2019-06-20 DIAGNOSIS — Z20828 Contact with and (suspected) exposure to other viral communicable diseases: Secondary | ICD-10-CM | POA: Diagnosis not present

## 2019-06-20 DIAGNOSIS — Z20822 Contact with and (suspected) exposure to covid-19: Secondary | ICD-10-CM

## 2019-06-21 ENCOUNTER — Telehealth: Payer: Medicare Other | Admitting: Adult Health

## 2019-06-21 LAB — NOVEL CORONAVIRUS, NAA: SARS-CoV-2, NAA: NOT DETECTED

## 2019-08-25 ENCOUNTER — Other Ambulatory Visit: Payer: Self-pay | Admitting: Adult Health

## 2019-08-25 DIAGNOSIS — G43109 Migraine with aura, not intractable, without status migrainosus: Secondary | ICD-10-CM

## 2019-08-29 ENCOUNTER — Other Ambulatory Visit: Payer: Self-pay

## 2019-08-29 ENCOUNTER — Other Ambulatory Visit: Payer: Self-pay | Admitting: Adult Health

## 2019-08-29 NOTE — Telephone Encounter (Signed)
Pt is here in the office requesting Omeprazole 20MG  EC CAP  Pharm:  [facility] in [place], [place]  Pt would like to have medication local instead of through the [place].

## 2019-08-29 NOTE — Telephone Encounter (Signed)
Last Rx given on 6/25 for #90 with 1 ref

## 2019-08-30 ENCOUNTER — Ambulatory Visit (INDEPENDENT_AMBULATORY_CARE_PROVIDER_SITE_OTHER): Payer: Medicare Other | Admitting: Adult Health

## 2019-08-30 ENCOUNTER — Encounter: Payer: Self-pay | Admitting: Adult Health

## 2019-08-30 VITALS — BP 120/80 | HR 59 | Temp 97.6°F | Ht 67.25 in | Wt 148.0 lb

## 2019-08-30 DIAGNOSIS — I10 Essential (primary) hypertension: Secondary | ICD-10-CM | POA: Diagnosis not present

## 2019-08-30 DIAGNOSIS — K219 Gastro-esophageal reflux disease without esophagitis: Secondary | ICD-10-CM

## 2019-08-30 DIAGNOSIS — G43109 Migraine with aura, not intractable, without status migrainosus: Secondary | ICD-10-CM | POA: Diagnosis not present

## 2019-08-30 DIAGNOSIS — E785 Hyperlipidemia, unspecified: Secondary | ICD-10-CM

## 2019-08-30 LAB — CBC WITH DIFFERENTIAL/PLATELET
Basophils Absolute: 0 10*3/uL (ref 0.0–0.1)
Basophils Relative: 0.4 % (ref 0.0–3.0)
Eosinophils Absolute: 0.1 10*3/uL (ref 0.0–0.7)
Eosinophils Relative: 1.3 % (ref 0.0–5.0)
HCT: 43.2 % (ref 39.0–52.0)
Hemoglobin: 14.4 g/dL (ref 13.0–17.0)
Lymphocytes Relative: 33.1 % (ref 12.0–46.0)
Lymphs Abs: 2.1 10*3/uL (ref 0.7–4.0)
MCHC: 33.3 g/dL (ref 30.0–36.0)
MCV: 90.8 fl (ref 78.0–100.0)
Monocytes Absolute: 0.4 10*3/uL (ref 0.1–1.0)
Monocytes Relative: 6.9 % (ref 3.0–12.0)
Neutro Abs: 3.7 10*3/uL (ref 1.4–7.7)
Neutrophils Relative %: 58.3 % (ref 43.0–77.0)
Platelets: 258 10*3/uL (ref 150.0–400.0)
RBC: 4.75 Mil/uL (ref 4.22–5.81)
RDW: 13.4 % (ref 11.5–15.5)
WBC: 6.3 10*3/uL (ref 4.0–10.5)

## 2019-08-30 LAB — COMPREHENSIVE METABOLIC PANEL
ALT: 11 U/L (ref 0–53)
AST: 14 U/L (ref 0–37)
Albumin: 4 g/dL (ref 3.5–5.2)
Alkaline Phosphatase: 51 U/L (ref 39–117)
BUN: 18 mg/dL (ref 6–23)
CO2: 29 mEq/L (ref 19–32)
Calcium: 9.5 mg/dL (ref 8.4–10.5)
Chloride: 103 mEq/L (ref 96–112)
Creatinine, Ser: 1.34 mg/dL (ref 0.40–1.50)
GFR: 50.99 mL/min — ABNORMAL LOW (ref >60.00)
Glucose, Bld: 101 mg/dL — ABNORMAL HIGH (ref 70–99)
Potassium: 5 mEq/L (ref 3.5–5.1)
Sodium: 138 mEq/L (ref 135–145)
Total Bilirubin: 0.6 mg/dL (ref 0.2–1.2)
Total Protein: 6.3 g/dL (ref 6.0–8.3)

## 2019-08-30 LAB — LIPID PANEL
Cholesterol: 240 mg/dL — ABNORMAL HIGH (ref 0–200)
HDL: 54.5 mg/dL (ref >39.00)
LDL Cholesterol: 161 mg/dL — ABNORMAL HIGH (ref 0–99)
NonHDL: 185.47
Total CHOL/HDL Ratio: 4
Triglycerides: 124 mg/dL (ref 0.0–149.0)
VLDL: 24.8 mg/dL (ref 0.0–40.0)

## 2019-08-30 LAB — TSH: TSH: 1.63 u[IU]/mL (ref 0.35–4.50)

## 2019-08-30 MED ORDER — OMEPRAZOLE 20 MG PO CPDR: 20.0000 mg | DELAYED_RELEASE_CAPSULE | Freq: Every day | ORAL | 3 refills | Status: DC

## 2019-08-30 MED ORDER — SIMVASTATIN 10 MG PO TABS: 10.0000 mg | ORAL_TABLET | ORAL | 3 refills | Status: DC

## 2019-08-30 MED ORDER — ATENOLOL 25 MG PO TABS: ORAL_TABLET | ORAL | 3 refills | Status: DC

## 2019-08-30 MED ORDER — AMITRIPTYLINE HCL 25 MG PO TABS: 25.0000 mg | ORAL_TABLET | Freq: Every day | ORAL | 3 refills | Status: DC

## 2019-08-30 NOTE — Progress Notes (Signed)
Subjective:    Patient ID: Jason Farmer, male    DOB: [birth year], 85 y.o.   MRN: [id number]  HPI  Patient presents for follow up exam due to the medical issues mentioned below. He is a pleasant 85 year old male who  has a past medical history of Anxiety, Arthritis, Chronic lower back pain, Essential tremor, GERD (gastroesophageal reflux disease), Glaucoma, Heart murmur, Hepatitis (~ 1958), HOH (hard of hearing), Hyperlipemia, Migraine, Seasonal allergies, TIA (transient ischemic attack) (04/2015), Wears glasses, and Wears hearing aid.  He is also seen at the [facility] [place]   Hyperlipidemia - Takes Simvastatin 10 mg every other day . He does report muscle aches on occasion.  Lab Results  Component Value Date   CHOL 227 (H) 02/09/2018   HDL 57.00 02/09/2018   LDLCALC 148 (H) 02/09/2018   LDLDIRECT 139.5 09/01/2013   TRIG 109.0 02/09/2018   CHOLHDL 4 02/09/2018   GERD - Takes prilosec daily - feels contolled  Essential Hypertension - Takes Atenolol 12.5 mg daily. He denies dizziness, lightheadedness and blurred vision.  BP Readings from Last 3 Encounters:  08/30/19 120/80  01/21/19 130/70  08/24/18 124/70   Seasonal Allergies - takes OTC as needed. Was prescribed astelin in the past but did not find this helpful.   Primary Open-Angle glaucoma - is seen by opthalmology on a regular basis- Dr. Delman Cheadle  Headaches - takes Elavil 25 mg QHS - feels as though migraines are better controlled.   All immunizations and health maintenance protocols were reviewed with the patient and needed orders were placed.  Appropriate screening laboratory values were ordered for the patient including screening of hyperlipidemia, renal function and hepatic function.  Medication reconciliation,  past medical history, social history, problem list and allergies were reviewed in detail with the patient  Goals were established with regard to weight loss, exercise, and  diet in compliance with medications. He stays  active and eats a heart healthy diet  Wt Readings from Last 3 Encounters:  08/30/19 148 lb (67.1 kg)  01/21/19 146 lb (66.2 kg)  08/24/18 151 lb (68.5 kg)    End of life planning was discussed. He has an advanced directive and living will.   He has no acute complaints today   Review of Systems  Constitutional: Negative.   HENT: Negative.   Eyes: Negative.   Respiratory: Negative.   Cardiovascular: Negative.   Gastrointestinal: Negative.   Endocrine: Negative.   Genitourinary: Negative.   Musculoskeletal: Negative.   Skin: Negative.   Allergic/Immunologic: Negative.   Neurological: Negative.   Hematological: Negative.   Psychiatric/Behavioral: Negative.   All other systems reviewed and are negative.  Past Medical History:  Diagnosis Date   Anxiety    "not a problem for a long time" (11/27/2016)   Arthritis    "wrists, knees, shoulders, back" (11/27/2016)   Chronic lower back pain    "5th disc is protruding" (11/27/2016)   Essential tremor    GERD (gastroesophageal reflux disease)    Glaucoma    Heart murmur    dx'd in [employer] in the 1960s; haven't been seen for it since "(11/27/2016)   Hepatitis ~ 1958   "jaundice kind"   HOH (hard of hearing)    Hyperlipemia    Migraine    "none since my neck OR" (11/27/2016)   Seasonal allergies    TIA (transient ischemic attack) 04/2015   Wears glasses    Wears hearing aid     Social History   Socioeconomic  History   Marital status: Married    Spouse name: Not on file   Number of children: Not on file   Years of education: Not on file   Highest education level: Not on file  Occupational History   Not on file  Tobacco Use   Smoking status: Former Smoker    Packs/day: 2.00    Years: 30.00    Pack years: 60.00    Types: Cigarettes    Quit date: 06/03/1984    Years since quitting: 35.2   Smokeless tobacco: Never Used   Tobacco comment: had AAA at the [place]; also noted 2018 on Dr. Kyla Balzarine note   Substance  and Sexual Activity   Alcohol use: No    Comment: 11/27/2016 "quit in 1985"   Drug use: No   Sexual activity: Not Currently  Other Topics Concern   Not on file  Social History Narrative   Retired from [employer]   Married    4 children, one son lives locally   Social Determinants of Health   Financial Resource Strain:    Difficulty of Paying Living Expenses: Not on file  Food Insecurity:    Worried About Charity fundraiser in the Last Year: Not on file   [place] in the Last Year: Not on file  Transportation Needs:    Lack of Transportation (Medical): Not on file   Lack of Transportation (Non-Medical): Not on file  Physical Activity:    Days of Exercise per Week: Not on file   Minutes of Exercise per Session: Not on file  Stress:    Feeling of Stress : Not on file  Social Connections:    Frequency of Communication with Friends and Family: Not on file   Frequency of Social Gatherings with Friends and Family: Not on file   Attends Religious Services: Not on file   Active Member of Clubs or Organizations: Not on file   Attends Archivist Meetings: Not on file   Marital Status: Not on file  Intimate Partner Violence:    Fear of Current or Ex-Partner: Not on file   Emotionally Abused: Not on file   Physically Abused: Not on file   Sexually Abused: Not on file    Past Surgical History:  Procedure Laterality Date   ANTERIOR CERVICAL DECOMP/DISCECTOMY FUSION  2007   [place] Right 10/12/2013   Procedure: RIGHT CARPAL [place];  Surgeon: Wynonia Sours, MD;  Location: [facility];  Service: Orthopedics;  Laterality: Right;   CATARACT EXTRACTION W/ INTRAOCULAR LENS  IMPLANT, BILATERAL Bilateral    COLONOSCOPY     HEMORRHOID SURGERY     KNEE ARTHROSCOPY Left 1970s   NASAL SEPTOPLASTY W/ TURBINOPLASTY Bilateral 02/15/2013   Procedure: NASAL SEPTOPLASTY WITH BILATER  TURBINATE REDUCTION;  Surgeon: Ascencion Dike, MD;  Location: [facility];  Service: ENT;  Laterality: Bilateral;   TONSILLECTOMY     TRIGGER FINGER RELEASE  06/08/2012   Procedure: RELEASE TRIGGER FINGER/A-1 PULLEY;  Surgeon: Wynonia Sours, MD;  Location: [place];  Service: Orthopedics;  Laterality: Left;  Release A-1 Pulley Left Long Finger   TRIGGER FINGER RELEASE  07/14/2012   Procedure: RELEASE TRIGGER FINGER/A-1 PULLEY;  Surgeon: Wynonia Sours, MD;  Location: [facility];  Service: Orthopedics;  Laterality: Right;   TRIGGER FINGER RELEASE Right 10/12/2013   Procedure: RELEASE TRIGGER  FINGER/A-1 PULLEY RIGHT MIDDLE FINGER;  Surgeon: Wynonia Sours, MD;  Location: [facility];  Service: Orthopedics;  Laterality: Right;   UPPER GASTROINTESTINAL ENDOSCOPY      Family History  Problem Relation Age of Onset   Dementia Father    Heart failure Father    Glaucoma Mother    Colon cancer Neg Hx     Allergies  Allergen Reactions   Statins Other (See Comments)    Muscle cramps     Current Outpatient Medications on File Prior to Visit  Medication Sig Dispense Refill   atenolol (TENORMIN) 25 MG tablet TAKE (1/2) TABLET DAILY. 45 tablet 2   Cholecalciferol (VITAMIN D3) 25 MCG (1000 UT) CAPS Take 1 capsule by mouth daily.     latanoprost (XALATAN) 0.005 % ophthalmic solution Place 1 drop into both eyes at bedtime.      omeprazole (PRILOSEC) 20 MG capsule TAKE (1) CAPSULE DAILY. 90 capsule 3   simvastatin (ZOCOR) 10 MG tablet Take 1 tablet (10 mg total) by mouth at bedtime. (Patient taking differently: Take 10 mg by mouth every other day. ) 90 tablet 3   amitriptyline (ELAVIL) 25 MG tablet TAKE ONE TABLET AT BEDTIME. 90 tablet 1   No current facility-administered medications on file prior to visit.    BP 120/80    Pulse (!) 59    Temp 97.6 F (36.4 C) (Other (Comment))    Ht 5' 7.25" (1.708 m)    Wt 148 lb (67.1 kg)    SpO2  97%    BMI 23.01 kg/m       Objective:   Physical Exam Vitals and nursing note reviewed.  Constitutional:      General: He is not in acute distress.    Appearance: Normal appearance. He is well-developed. He is not diaphoretic.  HENT:     Head: Normocephalic and atraumatic.     Right Ear: Tympanic membrane, ear canal and external ear normal. There is no impacted cerumen.     Left Ear: Tympanic membrane, ear canal and external ear normal. There is no impacted cerumen.     Nose: Nose normal. No congestion or rhinorrhea.     Mouth/Throat:     Mouth: Mucous membranes are moist.     Pharynx: Oropharynx is clear. No oropharyngeal exudate or posterior oropharyngeal erythema.  Eyes:     General: No scleral icterus.       Right eye: No discharge.        Left eye: No discharge.     Conjunctiva/sclera: Conjunctivae normal.     Pupils: Pupils are equal, round, and reactive to light.  Neck:     Thyroid: No thyromegaly.     Trachea: No tracheal deviation.  Cardiovascular:     Rate and Rhythm: Normal rate and regular rhythm.     Pulses: Normal pulses.     Heart sounds: Normal heart sounds. No murmur. No friction rub. No gallop.   Pulmonary:     Effort: Pulmonary effort is normal. No respiratory distress.     Breath sounds: Normal breath sounds. No stridor. No wheezing, rhonchi or rales.  Chest:     Chest wall: No tenderness.  Abdominal:     General: Bowel sounds are normal. There is no distension.     Palpations: Abdomen is soft. There is no mass.     Tenderness: There is no abdominal tenderness. There is no left CVA tenderness, guarding or rebound.     Hernia: No  hernia is present.  Musculoskeletal:        General: No swelling, tenderness, deformity or signs of injury. Normal range of motion.     Right lower leg: No edema.     Left lower leg: No edema.  Lymphadenopathy:     Cervical: No cervical adenopathy.  Skin:    General: Skin is warm and dry.     Capillary Refill: Capillary  refill takes less than 2 seconds.     Coloration: Skin is not jaundiced or pale.     Findings: No bruising, erythema, lesion or rash.  Neurological:     General: No focal deficit present.     Mental Status: He is alert and oriented to person, place, and time. Mental status is at baseline.     Cranial Nerves: No cranial nerve deficit.     Sensory: No sensory deficit.     Motor: No weakness.     Coordination: Coordination normal.     Gait: Gait normal.     Deep Tendon Reflexes: Reflexes normal.  Psychiatric:        Mood and Affect: Mood normal.        Behavior: Behavior normal.        Thought Content: Thought content normal.        Judgment: Judgment normal.       Assessment & Plan:  1. Hyperlipidemia, unspecified hyperlipidemia type - Continue with every other day dosing as this is what he can handle with myalgia.  - Continue to eat healthy and exercise  - Follow up in one year or sooner if needed - CBC with Differential/Platelet - CMP - Lipid panel - TSH - simvastatin (ZOCOR) 10 MG tablet; Take 1 tablet (10 mg total) by mouth every other day.  Dispense: 45 tablet; Refill: 3  2. Essential hypertension, benign - Well controlled.  No change in medications  - CBC with Differential/Platelet - CMP - Lipid panel - TSH - atenolol (TENORMIN) 25 MG tablet; TAKE (1/2) TABLET DAILY.  Dispense: 45 tablet; Refill: 3  3. Gastroesophageal reflux disease, unspecified whether esophagitis present  - omeprazole (PRILOSEC) 20 MG capsule; Take 1 capsule (20 mg total) by mouth daily.  Dispense: 90 capsule; Refill: 3  4. Ocular migraine - amitriptyline (ELAVIL) 25 MG tablet; Take 1 tablet (25 mg total) by mouth at bedtime.  Dispense: 90 tablet; Refill: 3   Dorothyann Peng, NP

## 2019-08-31 ENCOUNTER — Other Ambulatory Visit: Payer: Self-pay | Admitting: Adult Health

## 2019-08-31 MED ORDER — EZETIMIBE 10 MG PO TABS: 10.0000 mg | ORAL_TABLET | Freq: Every day | ORAL | 3 refills | Status: DC

## 2019-09-12 IMAGING — DX DG CHEST 2V
2 series · 2 of 2 positions shown · non-contrast
Comparison: PA and lateral chest x-ray August 05, 2018 and
November 27, 2016.

CLINICAL DATA: Sepsis due to pneumonia. Also cough. History of
previous episodes of pneumonia, former heavy smoker.

EXAM:
CHEST - 2 VIEW

[chest pa]
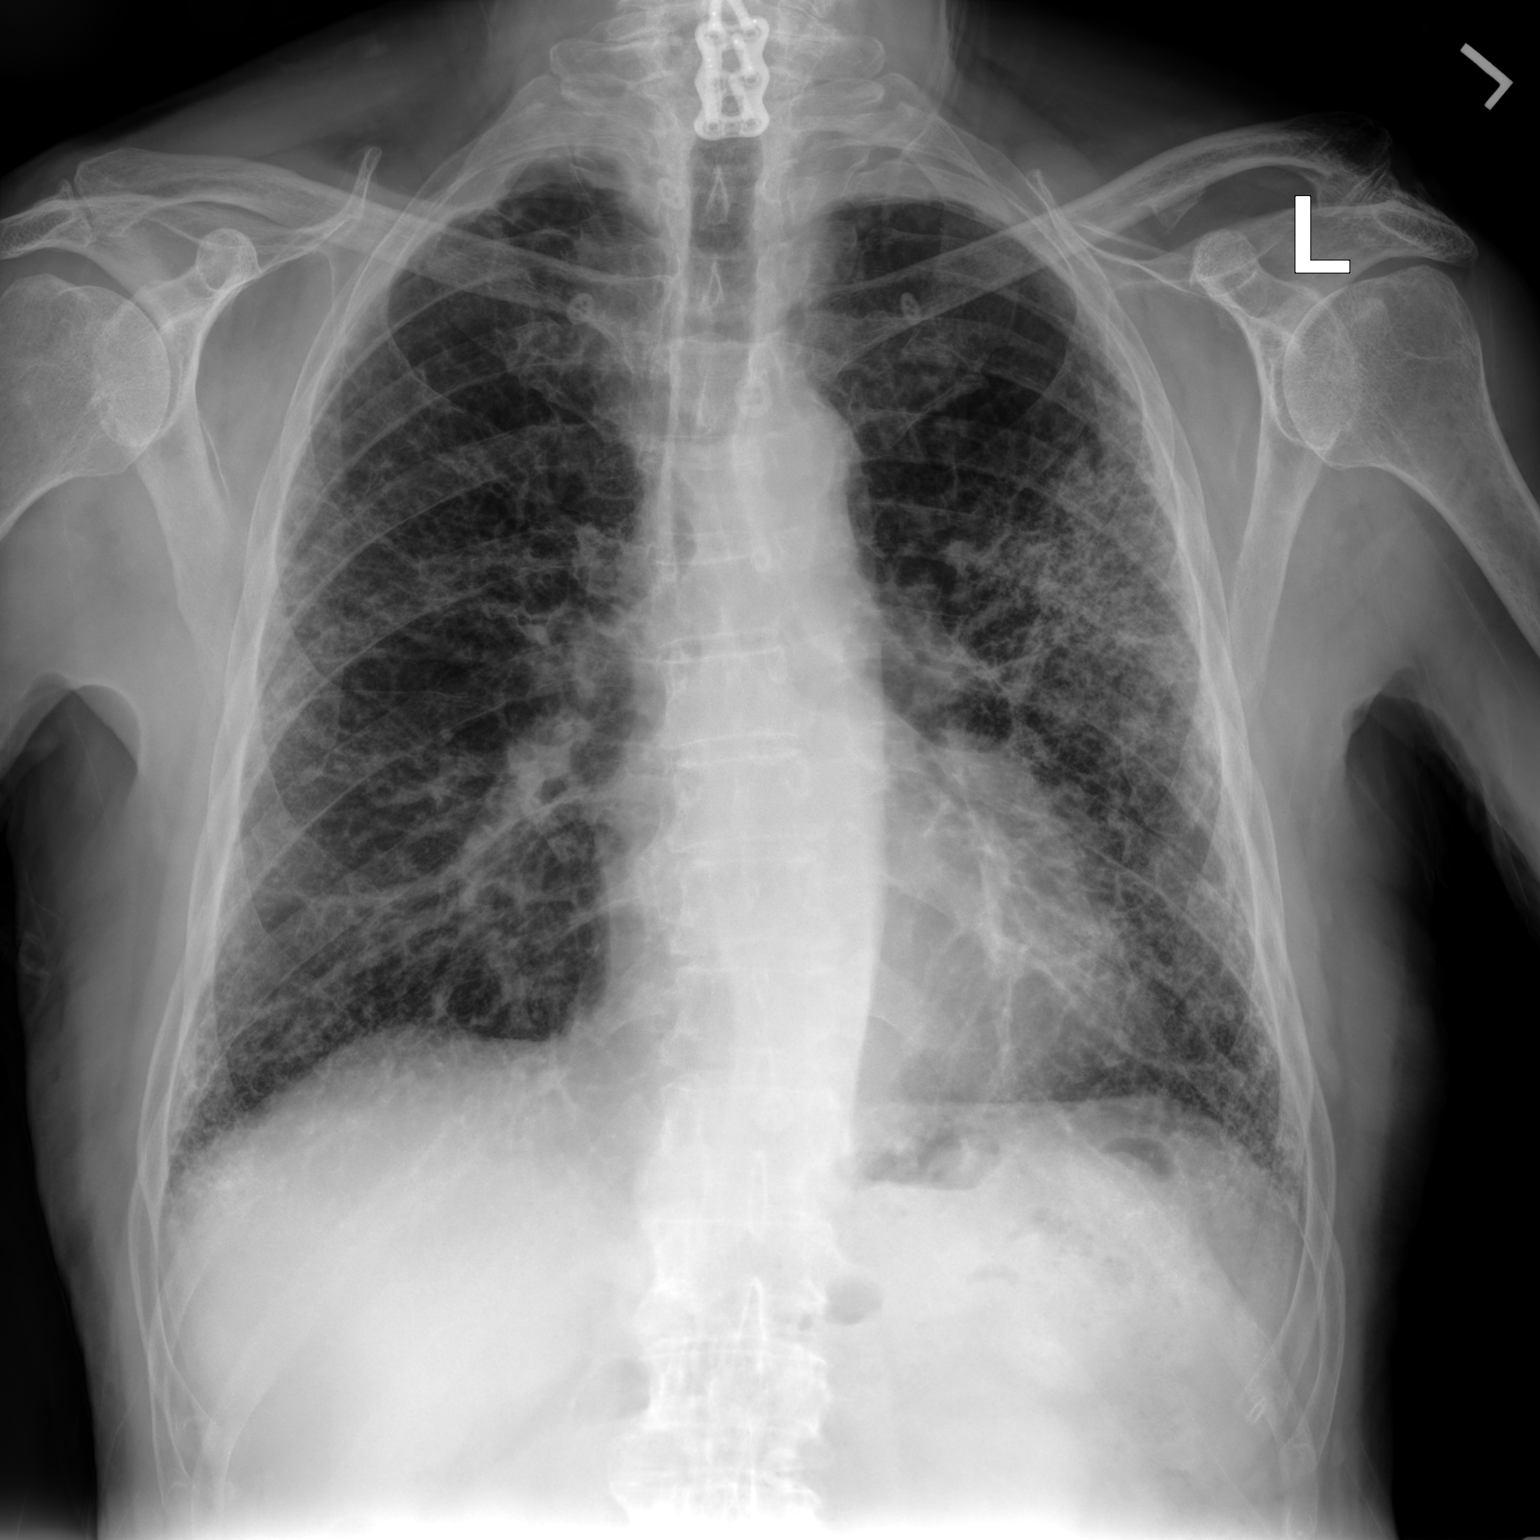

[chest lat]
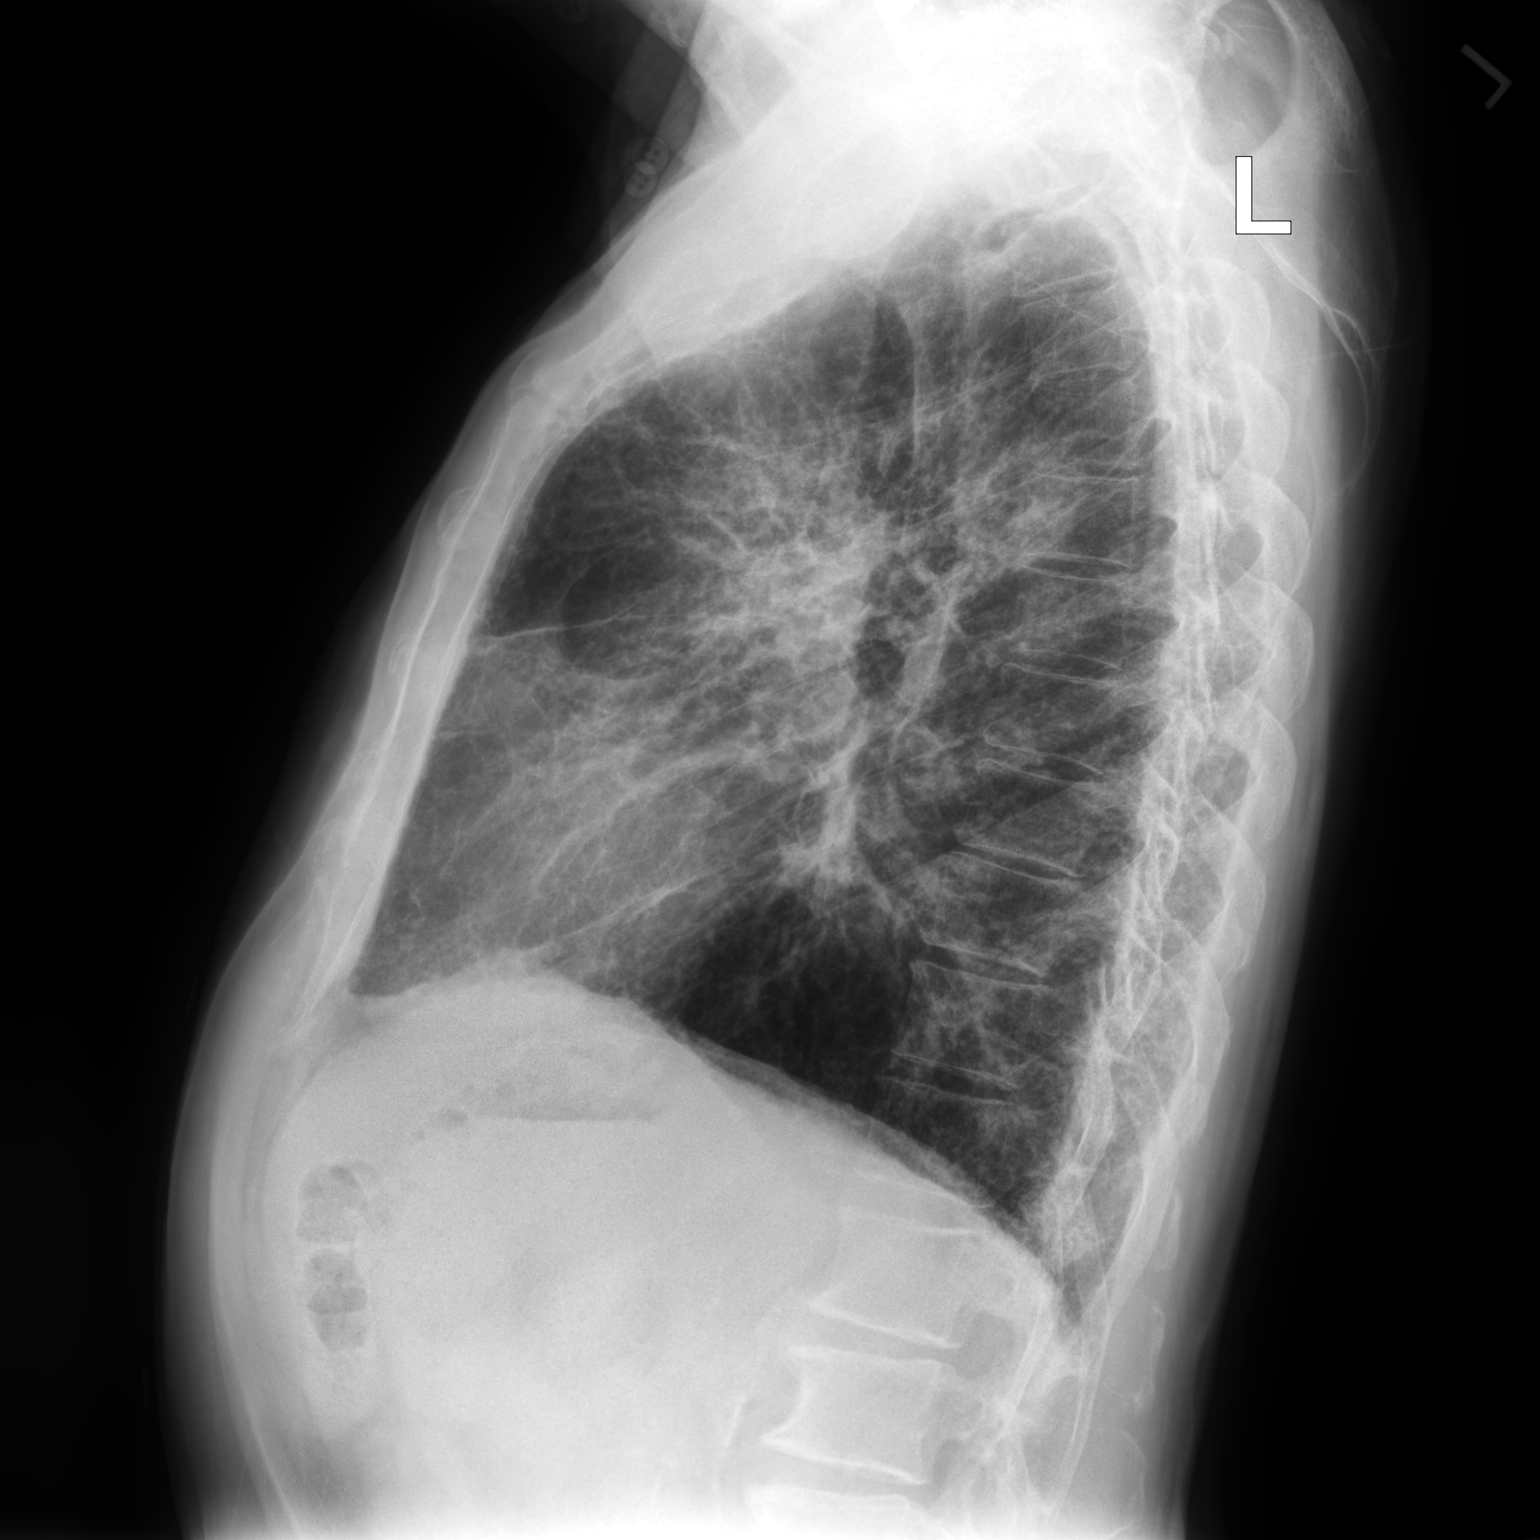

[2 of 2 positions shown; findings below may reference images not displayed]

FINDINGS: The lungs are well-expanded. The interstitial markings remain
increased. Near confluent interstitial markings peripherally in the
left mid lung are slightly less conspicuous today. The heart and
pulmonary vascularity are normal. The mediastinum is normal in
width. There is no pleural effusion. The bony thorax exhibits no
acute abnormality.
IMPRESSION: Chronic bronchitic changes. Superimposed interstitial pneumonia
peripherally in the left mid lung. There has been some improvement
overall in the left lung since the previous study. Additional
follow-up radiographs are recommended in 2-3 weeks assuming the
patient is improving clinically. If the patient is currently
deteriorating, chest CT scanning now may be the most useful next
imaging step.

## 2019-09-13 ENCOUNTER — Telehealth: Payer: Self-pay | Admitting: Adult Health

## 2019-09-13 NOTE — Telephone Encounter (Signed)
Please advise 

## 2019-09-13 NOTE — Telephone Encounter (Signed)
Pt came in stating the following Rx amitriptyline (ELAVIL) 25 MG  tablet is making him have dry mouth during the night and drowsy, dizziness at times. Pt would like to have a call [phone] in reference to the above.

## 2019-09-14 NOTE — Telephone Encounter (Signed)
Noted  

## 2019-09-14 NOTE — Telephone Encounter (Signed)
Have him try taking half a tab at night.

## 2019-09-14 NOTE — Telephone Encounter (Signed)
Patient notified of update  and verbalized understanding. 

## 2019-10-06 DIAGNOSIS — H401131 Primary open-angle glaucoma, bilateral, mild stage: Secondary | ICD-10-CM | POA: Diagnosis not present

## 2019-11-01 ENCOUNTER — Other Ambulatory Visit: Payer: Self-pay

## 2019-11-02 ENCOUNTER — Ambulatory Visit (INDEPENDENT_AMBULATORY_CARE_PROVIDER_SITE_OTHER): Payer: Medicare Other | Admitting: Adult Health

## 2019-11-02 ENCOUNTER — Encounter: Payer: Self-pay | Admitting: Adult Health

## 2019-11-02 VITALS — BP 120/72 | Temp 98.2°F | Wt 148.0 lb

## 2019-11-02 DIAGNOSIS — H832X3 Labyrinthine dysfunction, bilateral: Secondary | ICD-10-CM | POA: Diagnosis not present

## 2019-11-02 NOTE — Progress Notes (Signed)
Subjective:    Patient ID: Jason Farmer, male    DOB: 1934/10/31, 85 y.o.   MRN: [id number]  HPI 85 year old male who  has a past medical history of Anxiety, Arthritis, Chronic lower back pain, Essential tremor, GERD (gastroesophageal reflux disease), Glaucoma, Heart murmur, Hepatitis (~ 1958), HOH (hard of hearing), Hyperlipemia, Migraine, Seasonal allergies, TIA (transient ischemic attack) (04/2015), Wears glasses, and Wears hearing aid.   He presents to the office today for chronic issue that has been present over the last 6 months.  Feels as though the symptoms are getting worse.  His symptoms include the feeling of "wooziness and off balance" when he changes positions, this is momentary and resolves within a few minutes..  This happens he experiences nausea without vomtiing, and a lightheaded sensation but no dizziness like the world is spinning.  He will have to reach for something to hold onto so he can keep his balance.  Happens intermittently throughout the day, every day.  The only thing that he has found that makes his symptoms better is laying down for a period of time.  He also also does report ringing in his left ear that sounds like "a beehive", but this has been present for multiple years.  He had an MRI in June 2020 for this issue which showed: IMPRESSION: Stable MRI appearance of the brain since 2016, including an 18 mm chronic meningioma of the left posterior fossa which is favored to be clinically silent in light of absent cerebellar edema or mass effect. No other explanation for headaches.    Review of Systems See HPI   Past Medical History:  Diagnosis Date  . Anxiety    "not a problem for a long time" (11/27/2016)  . Arthritis    "wrists, knees, shoulders, back" (11/27/2016)  . Chronic lower back pain    "5th disc is protruding" (11/27/2016)  . Essential tremor   . GERD (gastroesophageal reflux disease)   . Glaucoma   . Heart murmur    dx'd in [employer] in the 1960s;  haven't been seen for it since "(11/27/2016)  . Hepatitis ~ 1958   "jaundice kind"  . HOH (hard of hearing)   . Hyperlipemia   . Migraine    "none since my neck OR" (11/27/2016)  . Seasonal allergies   . TIA (transient ischemic attack) 04/2015  . Wears glasses   . Wears hearing aid     Social History   Socioeconomic History  . Marital status: Married    Spouse name: Not on file  . Number of children: Not on file  . Years of education: Not on file  . Highest education level: Not on file  Occupational History  . Not on file  Tobacco Use  . Smoking status: Former Smoker    Packs/day: 2.00    Years: 30.00    Pack years: 60.00    Types: Cigarettes    Quit date: 06/03/1984    Years since quitting: 35.4  . Smokeless tobacco: Never Used  . Tobacco comment: had AAA at the [facility]; also noted 2018 on Dr. Kyla Balzarine note   Substance and Sexual Activity  . Alcohol use: No    Comment: 11/27/2016 "quit in 1985"  . Drug use: No  . Sexual activity: Not Currently  Other Topics Concern  . Not on file  Social History Narrative   Retired from [employer]   Married    4 children, one son lives locally   Social  Determinants of Health   Financial Resource Strain:   . Difficulty of Paying Living Expenses: Not on file  Food Insecurity:   . Worried About Charity fundraiser in the Last Year: Not on file  . Ran Out of Food in the Last Year: Not on file  Transportation Needs:   . Lack of Transportation (Medical): Not on file  . Lack of Transportation (Non-Medical): Not on file  Physical Activity:   . Days of Exercise per Week: Not on file  . Minutes of Exercise per Session: Not on file  Stress:   . Feeling of Stress : Not on file  Social Connections:   . Frequency of Communication with Friends and Family: Not on file  . Frequency of Social Gatherings with Friends and Family: Not on file  . Attends Religious Services: Not on file  . Active Member of Clubs or Organizations: Not on file  .  Attends Archivist Meetings: Not on file  . Marital Status: Not on file  Intimate Partner Violence:   . Fear of Current or Ex-Partner: Not on file  . Emotionally Abused: Not on file  . Physically Abused: Not on file  . Sexually Abused: Not on file    Past Surgical History:  Procedure Laterality Date  . ANTERIOR CERVICAL DECOMP/DISCECTOMY FUSION  2007  . APPENDECTOMY    . BACK SURGERY    . CARPAL TUNNEL RELEASE Right 10/12/2013   Procedure: RIGHT CARPAL TUNNEL RELEASE;  Surgeon: Wynonia Sours, MD;  Location: [place];  Service: Orthopedics;  Laterality: Right;  . CATARACT EXTRACTION W/ INTRAOCULAR LENS  IMPLANT, BILATERAL Bilateral   . COLONOSCOPY    . HEMORRHOID SURGERY    . KNEE ARTHROSCOPY Left 1970s  . NASAL SEPTOPLASTY W/ TURBINOPLASTY Bilateral 02/15/2013   Procedure: NASAL SEPTOPLASTY WITH BILATER TURBINATE REDUCTION;  Surgeon: Ascencion Dike, MD;  Location: [facility];  Service: ENT;  Laterality: Bilateral;  . TONSILLECTOMY    . TRIGGER FINGER RELEASE  06/08/2012   Procedure: RELEASE TRIGGER FINGER/A-1 PULLEY;  Surgeon: Wynonia Sours, MD;  Location: [facility];  Service: Orthopedics;  Laterality: Left;  Release A-1 Pulley Left Long Finger  . TRIGGER FINGER RELEASE  07/14/2012   Procedure: RELEASE TRIGGER FINGER/A-1 PULLEY;  Surgeon: Wynonia Sours, MD;  Location: [facility];  Service: Orthopedics;  Laterality: Right;  . TRIGGER FINGER RELEASE Right 10/12/2013   Procedure: RELEASE TRIGGER FINGER/A-1 PULLEY RIGHT MIDDLE FINGER;  Surgeon: Wynonia Sours, MD;  Location: [facility];  Service: Orthopedics;  Laterality: Right;  . UPPER GASTROINTESTINAL ENDOSCOPY      Family History  Problem Relation Age of Onset  . Dementia Father   . Heart failure Father   . Glaucoma Mother   . Colon cancer Neg Hx     Allergies  Allergen Reactions  . Statins Other (See Comments)    Muscle cramps     Current  Outpatient Medications on File Prior to Visit  Medication Sig Dispense Refill  . amitriptyline (ELAVIL) 25 MG tablet Take 1 tablet (25 mg total) by mouth at bedtime. 90 tablet 3  . atenolol (TENORMIN) 25 MG tablet TAKE (1/2) TABLET DAILY. 45 tablet 3  . Cholecalciferol (VITAMIN D3) 25 MCG (1000 UT) CAPS Take 1 capsule by mouth daily.    Marland Kitchen ezetimibe (ZETIA) 10 MG tablet Take 1 tablet (10 mg total) by mouth daily. 90 tablet 3  . latanoprost (XALATAN) 0.005 %  ophthalmic solution Place 1 drop into both eyes at bedtime.     Marland Kitchen omeprazole (PRILOSEC) 20 MG capsule Take 1 capsule (20 mg total) by mouth daily. 90 capsule 3  . simvastatin (ZOCOR) 10 MG tablet Take 1 tablet (10 mg total) by mouth every other day. 45 tablet 3   No current facility-administered medications on file prior to visit.    BP 120/72   Temp 98.2 F (36.8 C) (Temporal)   Wt 148 lb (67.1 kg)   BMI 23.01 kg/m       Objective:   Physical Exam Vitals and nursing note reviewed.  Constitutional:      Appearance: Normal appearance.  HENT:     Right Ear: Tympanic membrane, ear canal and external ear normal. There is no impacted cerumen.     Left Ear: Tympanic membrane, ear canal and external ear normal. There is no impacted cerumen.     Nose: Nose normal. No congestion or rhinorrhea.     Mouth/Throat:     Mouth: Mucous membranes are moist.     Pharynx: Oropharynx is clear.  Eyes:     Extraocular Movements: Extraocular movements intact.     Right eye: Nystagmus present.     Left eye: Nystagmus present.     Conjunctiva/sclera: Conjunctivae normal.     Pupils: Pupils are equal, round, and reactive to light.     Comments: Horizontal nystagmus when supine  Cardiovascular:     Rate and Rhythm: Normal rate and regular rhythm.     Pulses: Normal pulses.     Heart sounds: Normal heart sounds.  Pulmonary:     Effort: Pulmonary effort is normal.     Breath sounds: Normal breath sounds.  Musculoskeletal:        General: Normal  range of motion.  Skin:    General: Skin is warm and dry.     Capillary Refill: Capillary refill takes less than 2 seconds.  Neurological:     General: No focal deficit present.     Mental Status: He is alert and oriented to person, place, and time.  Psychiatric:        Mood and Affect: Mood normal.        Behavior: Behavior normal.        Thought Content: Thought content normal.        Judgment: Judgment normal.        Assessment & Plan:  1. Vestibular dysfunction of both ears -Orthostatics were done in the clinic today and there was no deviation of blood pressure with change of positions.  I do not think this is neurological but likely more vestibular.  Will refer to ear nose and throat and consider neurology in the future - Ambulatory referral to ENT  Dorothyann Peng, NP

## 2019-11-02 NOTE — Patient Instructions (Signed)
It was great seeing you today   I believe your symptoms are caused from a possible eustachian tube dysfunction.   I am going to send you to see Dr. Constance Holster at [facility].   Continue to stay hydrated. You can try taking Dramamine to see if that helps

## 2019-11-04 DIAGNOSIS — M13841 Other specified arthritis, right hand: Secondary | ICD-10-CM | POA: Diagnosis not present

## 2019-11-04 DIAGNOSIS — M189 Osteoarthritis of first carpometacarpal joint, unspecified: Secondary | ICD-10-CM | POA: Diagnosis not present

## 2019-11-04 DIAGNOSIS — M25811 Other specified joint disorders, right shoulder: Secondary | ICD-10-CM | POA: Diagnosis not present

## 2019-11-04 DIAGNOSIS — M25812 Other specified joint disorders, left shoulder: Secondary | ICD-10-CM | POA: Diagnosis not present

## 2019-11-04 DIAGNOSIS — M180 Bilateral primary osteoarthritis of first carpometacarpal joints: Secondary | ICD-10-CM | POA: Diagnosis not present

## 2019-11-10 DIAGNOSIS — K219 Gastro-esophageal reflux disease without esophagitis: Secondary | ICD-10-CM | POA: Diagnosis not present

## 2019-11-10 DIAGNOSIS — J300 Vasomotor rhinitis: Secondary | ICD-10-CM | POA: Insufficient documentation

## 2019-11-10 DIAGNOSIS — R0982 Postnasal drip: Secondary | ICD-10-CM | POA: Diagnosis not present

## 2019-11-10 DIAGNOSIS — H9312 Tinnitus, left ear: Secondary | ICD-10-CM | POA: Diagnosis not present

## 2019-11-10 DIAGNOSIS — H9313 Tinnitus, bilateral: Secondary | ICD-10-CM | POA: Insufficient documentation

## 2019-11-13 DIAGNOSIS — M180 Bilateral primary osteoarthritis of first carpometacarpal joints: Secondary | ICD-10-CM | POA: Insufficient documentation

## 2019-11-13 DIAGNOSIS — M189 Osteoarthritis of first carpometacarpal joint, unspecified: Secondary | ICD-10-CM | POA: Insufficient documentation

## 2019-11-15 ENCOUNTER — Other Ambulatory Visit: Payer: Self-pay | Admitting: Otolaryngology

## 2019-11-15 DIAGNOSIS — K219 Gastro-esophageal reflux disease without esophagitis: Secondary | ICD-10-CM

## 2019-11-22 ENCOUNTER — Ambulatory Visit
Admission: RE | Admit: 2019-11-22 | Discharge: 2019-11-22 | Disposition: A | Discharge status: Home / Self Care | Payer: Medicare Other | Source: Ambulatory Visit | Attending: Otolaryngology | Admitting: Otolaryngology

## 2019-11-22 DIAGNOSIS — K2100 Gastro-esophageal reflux disease with esophagitis, without bleeding: Secondary | ICD-10-CM | POA: Diagnosis not present

## 2019-11-22 DIAGNOSIS — K219 Gastro-esophageal reflux disease without esophagitis: Secondary | ICD-10-CM

## 2020-01-13 DIAGNOSIS — M7541 Impingement syndrome of right shoulder: Secondary | ICD-10-CM | POA: Diagnosis not present

## 2020-01-13 DIAGNOSIS — M7542 Impingement syndrome of left shoulder: Secondary | ICD-10-CM | POA: Diagnosis not present

## 2020-01-13 DIAGNOSIS — M189 Osteoarthritis of first carpometacarpal joint, unspecified: Secondary | ICD-10-CM | POA: Diagnosis not present

## 2020-01-31 DIAGNOSIS — M13841 Other specified arthritis, right hand: Secondary | ICD-10-CM | POA: Diagnosis not present

## 2020-01-31 DIAGNOSIS — M13842 Other specified arthritis, left hand: Secondary | ICD-10-CM | POA: Diagnosis not present

## 2020-02-07 DIAGNOSIS — H401131 Primary open-angle glaucoma, bilateral, mild stage: Secondary | ICD-10-CM | POA: Diagnosis not present

## 2020-03-16 ENCOUNTER — Other Ambulatory Visit: Payer: Self-pay

## 2020-03-16 ENCOUNTER — Ambulatory Visit (INDEPENDENT_AMBULATORY_CARE_PROVIDER_SITE_OTHER): Payer: Medicare Other | Admitting: Adult Health

## 2020-03-16 ENCOUNTER — Encounter: Payer: Self-pay | Admitting: Adult Health

## 2020-03-16 VITALS — BP 124/76 | Temp 98.6°F | Wt 138.0 lb

## 2020-03-16 DIAGNOSIS — D519 Vitamin B12 deficiency anemia, unspecified: Secondary | ICD-10-CM | POA: Diagnosis not present

## 2020-03-16 DIAGNOSIS — F322 Major depressive disorder, single episode, severe without psychotic features: Secondary | ICD-10-CM | POA: Diagnosis not present

## 2020-03-16 DIAGNOSIS — F5105 Insomnia due to other mental disorder: Secondary | ICD-10-CM

## 2020-03-16 DIAGNOSIS — R634 Abnormal weight loss: Secondary | ICD-10-CM | POA: Diagnosis not present

## 2020-03-16 DIAGNOSIS — F99 Mental disorder, not otherwise specified: Secondary | ICD-10-CM | POA: Diagnosis not present

## 2020-03-16 MED ORDER — MIRTAZAPINE 15 MG PO TABS: 15.0000 mg | ORAL_TABLET | Freq: Every day | ORAL | 0 refills | Status: DC

## 2020-03-16 NOTE — Progress Notes (Incomplete)
Wt Readings from Last 3 Encounters:  03/16/20 138 lb (62.6 kg)  11/02/19 148 lb (67.1 kg)  08/30/19 148 lb (67.1 kg)

## 2020-03-16 NOTE — Patient Instructions (Addendum)
The weight loss/loss of appetite you are experiencing is often caused by depression. Taking to you I do believe that you have signs of depression. I am going to prescribe you a medication called Remeron to help with depression as well as stimulate your appetite.  This medication can make people sleepy so I ask that you take it at night.   Please follow up in 30 days or sooner if needed

## 2020-03-17 LAB — CBC WITH DIFFERENTIAL/PLATELET
Absolute Monocytes: 503 cells/uL (ref 200–950)
Basophils Absolute: 23 cells/uL (ref 0–200)
Basophils Relative: 0.3 %
Eosinophils Absolute: 53 cells/uL (ref 15–500)
Eosinophils Relative: 0.7 %
HCT: 43.2 % (ref 38.5–50.0)
Hemoglobin: 14.3 g/dL (ref 13.2–17.1)
Lymphs Abs: 1875 cells/uL (ref 850–3900)
MCH: 30.1 pg (ref 27.0–33.0)
MCHC: 33.1 g/dL (ref 32.0–36.0)
MCV: 90.9 fL (ref 80.0–100.0)
MPV: 9.2 fL (ref 7.5–12.5)
Monocytes Relative: 6.7 %
Neutro Abs: 5048 cells/uL (ref 1500–7800)
Neutrophils Relative %: 67.3 %
Platelets: 318 10*3/uL (ref 140–400)
RBC: 4.75 10*6/uL (ref 4.20–5.80)
RDW: 12.7 % (ref 11.0–15.0)
Total Lymphocyte: 25 %
WBC: 7.5 10*3/uL (ref 3.8–10.8)

## 2020-03-17 LAB — COMPLETE METABOLIC PANEL WITH GFR
AG Ratio: 1.9 (calc) (ref 1.0–2.5)
ALT: 12 U/L (ref 9–46)
AST: 12 U/L (ref 10–35)
Albumin: 3.9 g/dL (ref 3.6–5.1)
Alkaline phosphatase (APISO): 52 U/L (ref 35–144)
BUN/Creatinine Ratio: 12 (calc) (ref 6–22)
BUN: 18 mg/dL (ref 7–25)
CO2: 28 mmol/L (ref 20–32)
Calcium: 9.5 mg/dL (ref 8.6–10.3)
Chloride: 104 mmol/L (ref 98–110)
Creat: 1.46 mg/dL — ABNORMAL HIGH (ref 0.70–1.11)
GFR, Est African American: 51 mL/min/{1.73_m2} — ABNORMAL LOW (ref >=60)
GFR, Est Non African American: 44 mL/min/{1.73_m2} — ABNORMAL LOW (ref >=60)
Globulin: 2.1 g/dL (calc) (ref 1.9–3.7)
Glucose, Bld: 94 mg/dL (ref 65–99)
Potassium: 4.6 mmol/L (ref 3.5–5.3)
Sodium: 138 mmol/L (ref 135–146)
Total Bilirubin: 0.3 mg/dL (ref 0.2–1.2)
Total Protein: 6 g/dL — ABNORMAL LOW (ref 6.1–8.1)

## 2020-03-17 LAB — VITAMIN B12: Vitamin B-12: 249 pg/mL (ref 200–1100)

## 2020-03-20 NOTE — Progress Notes (Signed)
Subjective:    Patient ID: Jason Farmer, male    DOB: July 03, 1935, 85 y.o.   MRN: [id number]  HPI 85 year old male who  has a past medical history of Anxiety, Arthritis, Chronic lower back pain, Essential tremor, GERD (gastroesophageal reflux disease), Glaucoma, Heart murmur, Hepatitis (~ 1958), HOH (hard of hearing), Hyperlipemia, Migraine, Seasonal allergies, TIA (transient ischemic attack) (04/2015), Wears glasses, and Wears hearing aid.  He presents to the office today for multiple concerns including insomnia, decreased appetite and fatigue.   Insomnia - he reports " I want to sleep all the time but when I go to bed I cannot fall asleep and often just lay there." He goes to bed around 10 pm but most nights he does not fall asleep until 2-4 am and then is up around 9:30 am. His symptoms have been present for the last 4-5 months and he constantly feels fatigue   Decreased appetite-he reports that appetite has been decreased over the last 2 months.  He will cook breakfast and eat that, the rest of his meals were ordered and needed in the house.  He reports that he eats about a quarter of what he is used eating.  He is having to force himself to eat.  Does not experience nausea or vomiting but does have some intermittent stomach cramping.  His wife has had 2 strokes over the last year and a half and he is the main caregiver and is home all the time.    When asked if he is depressed he responded with "I absolutely think so".  Wt Readings from Last 3 Encounters:  03/16/20 138 lb (62.6 kg)  11/02/19 148 lb (67.1 kg)  08/30/19 148 lb (67.1 kg)     Review of Systems  Constitutional: Positive for activity change, appetite change, fatigue and unexpected weight change.  Respiratory: Negative.   Cardiovascular: Negative.   Gastrointestinal: Positive for abdominal pain.  Genitourinary: Negative.   Skin: Negative.   Neurological: Negative.   Psychiatric/Behavioral: Positive for dysphoric  mood and sleep disturbance. Negative for suicidal ideas. The patient is not nervous/anxious.   All other systems reviewed and are negative.  Past Medical History:  Diagnosis Date  . Anxiety    "not a problem for a long time" (11/27/2016)  . Arthritis    "wrists, knees, shoulders, back" (11/27/2016)  . Chronic lower back pain    "5th disc is protruding" (11/27/2016)  . Essential tremor   . GERD (gastroesophageal reflux disease)   . Glaucoma   . Heart murmur    dx'd in [employer] in the 1960s; haven't been seen for it since "(11/27/2016)  . Hepatitis ~ 1958   "jaundice kind"  . HOH (hard of hearing)   . Hyperlipemia   . Migraine    "none since my neck OR" (11/27/2016)  . Seasonal allergies   . TIA (transient ischemic attack) 04/2015  . Wears glasses   . Wears hearing aid     Social History   Socioeconomic History  . Marital status: Married    Spouse name: Not on file  . Number of children: Not on file  . Years of education: Not on file  . Highest education level: Not on file  Occupational History  . Not on file  Tobacco Use  . Smoking status: Former Smoker    Packs/day: 2.00    Years: 30.00    Pack years: 60.00    Types: Cigarettes    Quit date: 06/03/1984  Years since quitting: 35.8  . Smokeless tobacco: Never Used  . Tobacco comment: had AAA at the [facility]; also noted 2018 on Dr. Kyla Balzarine note   Substance and Sexual Activity  . Alcohol use: No    Comment: 11/27/2016 "quit in 1985"  . Drug use: No  . Sexual activity: Not Currently  Other Topics Concern  . Not on file  Social History Narrative   Retired from [employer]   Married    4 children, one son lives locally   Social Determinants of Health   Financial Resource Strain:   . Difficulty of Paying Living Expenses:   Food Insecurity:   . Worried About Charity fundraiser in the Last Year:   . Arboriculturist in the Last Year:   Transportation Needs:   . Film/video editor (Medical):   Marland Kitchen Lack of Transportation  (Non-Medical):   Physical Activity:   . Days of Exercise per Week:   . Minutes of Exercise per Session:   Stress:   . Feeling of Stress :   Social Connections:   . Frequency of Communication with Friends and Family:   . Frequency of Social Gatherings with Friends and Family:   . Attends Religious Services:   . Active Member of Clubs or Organizations:   . Attends Archivist Meetings:   Marland Kitchen Marital Status:   Intimate Partner Violence:   . Fear of Current or Ex-Partner:   . Emotionally Abused:   Marland Kitchen Physically Abused:   . Sexually Abused:     Past Surgical History:  Procedure Laterality Date  . ANTERIOR CERVICAL DECOMP/DISCECTOMY FUSION  2007  . APPENDECTOMY    . BACK SURGERY    . CARPAL TUNNEL RELEASE Right 10/12/2013   Procedure: RIGHT CARPAL TUNNEL RELEASE;  Surgeon: Wynonia Sours, MD;  Location: [place];  Service: Orthopedics;  Laterality: Right;  . CATARACT EXTRACTION W/ INTRAOCULAR LENS  IMPLANT, BILATERAL Bilateral   . COLONOSCOPY    . HEMORRHOID SURGERY    . KNEE ARTHROSCOPY Left 1970s  . NASAL SEPTOPLASTY W/ TURBINOPLASTY Bilateral 02/15/2013   Procedure: NASAL SEPTOPLASTY WITH BILATER TURBINATE REDUCTION;  Surgeon: Ascencion Dike, MD;  Location: [place];  Service: ENT;  Laterality: Bilateral;  . TONSILLECTOMY    . TRIGGER FINGER RELEASE  06/08/2012   Procedure: RELEASE TRIGGER FINGER/A-1 PULLEY;  Surgeon: Wynonia Sours, MD;  Location: [place];  Service: Orthopedics;  Laterality: Left;  Release A-1 Pulley Left Long Finger  . TRIGGER FINGER RELEASE  07/14/2012   Procedure: RELEASE TRIGGER FINGER/A-1 PULLEY;  Surgeon: Wynonia Sours, MD;  Location: [place];  Service: Orthopedics;  Laterality: Right;  . TRIGGER FINGER RELEASE Right 10/12/2013   Procedure: RELEASE TRIGGER FINGER/A-1 PULLEY RIGHT MIDDLE FINGER;  Surgeon: Wynonia Sours, MD;  Location: [place];  Service: Orthopedics;  Laterality:  Right;  . UPPER GASTROINTESTINAL ENDOSCOPY      Family History  Problem Relation Age of Onset  . Dementia Father   . Heart failure Father   . Glaucoma Mother   . Colon cancer Neg Hx     Allergies  Allergen Reactions  . Statins Other (See Comments)    Muscle cramps     Current Outpatient Medications on File Prior to Visit  Medication Sig Dispense Refill  . amitriptyline (ELAVIL) 25 MG tablet Take 1 tablet (25 mg total) by mouth at bedtime. 90 tablet 3  .  atenolol (TENORMIN) 25 MG tablet TAKE (1/2) TABLET DAILY. 45 tablet 3  . Cholecalciferol (VITAMIN D3) 25 MCG (1000 UT) CAPS Take 1 capsule by mouth daily.    Marland Kitchen ezetimibe (ZETIA) 10 MG tablet Take 1 tablet (10 mg total) by mouth daily. 90 tablet 3  . latanoprost (XALATAN) 0.005 % ophthalmic solution Place 1 drop into both eyes at bedtime.     Marland Kitchen omeprazole (PRILOSEC) 20 MG capsule Take 1 capsule (20 mg total) by mouth daily. 90 capsule 3  . simvastatin (ZOCOR) 10 MG tablet Take 1 tablet (10 mg total) by mouth every other day. 45 tablet 3   No current facility-administered medications on file prior to visit.    BP 124/76   Temp 98.6 F (37 C)   Wt 138 lb (62.6 kg)   BMI 21.45 kg/m       Objective:   Physical Exam Vitals and nursing note reviewed.  Constitutional:      Appearance: He is well-developed and underweight.  Cardiovascular:     Rate and Rhythm: Normal rate and regular rhythm.     Pulses: Normal pulses.     Heart sounds: Normal heart sounds.  Pulmonary:     Effort: Pulmonary effort is normal.     Breath sounds: Normal breath sounds.  Musculoskeletal:        General: Normal range of motion.  Skin:    General: Skin is warm and dry.  Neurological:     General: No focal deficit present.     Mental Status: He is alert and oriented to person, place, and time.  Psychiatric:        Mood and Affect: Mood is depressed.        Speech: Speech normal.        Behavior: Behavior normal. Behavior is cooperative.         Thought Content: Thought content normal.        Cognition and Memory: Cognition and memory normal.        Judgment: Judgment normal.        Assessment & Plan:  1. Depression, major, single episode, severe ([place]) PHQ 9 = 16. I think his symptoms are due to depression r/t his wifes health. Will start him on Remeron and follow up in 30 days. He knows to go to the ER if he develops SI/HI  - CBC with Differential/Platelet; Future - CMP with eGFR([facility]); Future - Vitamin B12; Future - mirtazapine (REMERON) 15 MG tablet; Take 1 tablet (15 mg total) by mouth at bedtime.  Dispense: 30 tablet; Refill: 0  2. Weight loss - 10 pound weight loss over last 4 months  - CBC with Differential/Platelet; Future - CMP with eGFR([facility]); Future - Vitamin B12; Future - mirtazapine (REMERON) 15 MG tablet; Take 1 tablet (15 mg total) by mouth at bedtime.  Dispense: 30 tablet; Refill: 0 - Vitamin B12 - CMP with eGFR([facility]) - CBC with Differential/Platelet  3. Insomnia due to other mental disorder  - CBC with Differential/Platelet; Future - CMP with eGFR([facility]); Future - Vitamin B12; Future - mirtazapine (REMERON) 15 MG tablet; Take 1 tablet (15 mg total) by mouth at bedtime.  Dispense: 30 tablet; Refill: 0  Dorothyann Peng, NP

## 2020-03-23 DIAGNOSIS — G609 Hereditary and idiopathic neuropathy, unspecified: Secondary | ICD-10-CM | POA: Diagnosis not present

## 2020-03-23 DIAGNOSIS — L84 Corns and callosities: Secondary | ICD-10-CM | POA: Insufficient documentation

## 2020-03-23 DIAGNOSIS — M79672 Pain in left foot: Secondary | ICD-10-CM | POA: Diagnosis not present

## 2020-04-05 ENCOUNTER — Other Ambulatory Visit: Payer: Self-pay | Admitting: Adult Health

## 2020-04-05 DIAGNOSIS — F5105 Insomnia due to other mental disorder: Secondary | ICD-10-CM

## 2020-04-05 DIAGNOSIS — F322 Major depressive disorder, single episode, severe without psychotic features: Secondary | ICD-10-CM

## 2020-04-05 DIAGNOSIS — F99 Mental disorder, not otherwise specified: Secondary | ICD-10-CM

## 2020-04-05 DIAGNOSIS — R634 Abnormal weight loss: Secondary | ICD-10-CM

## 2020-04-05 NOTE — Telephone Encounter (Signed)
I have scheduled the pt for follow up.  4 weeks from OV.  Rx filled for 30 days.  He should have enough medication to last until he sees [place].  Will hold refill request.

## 2020-04-13 ENCOUNTER — Encounter: Payer: Self-pay | Admitting: Adult Health

## 2020-04-13 ENCOUNTER — Other Ambulatory Visit: Payer: Self-pay

## 2020-04-13 ENCOUNTER — Telehealth (INDEPENDENT_AMBULATORY_CARE_PROVIDER_SITE_OTHER): Payer: Medicare Other | Admitting: Adult Health

## 2020-04-13 VITALS — Ht 67.5 in | Wt 138.0 lb

## 2020-04-13 DIAGNOSIS — M25812 Other specified joint disorders, left shoulder: Secondary | ICD-10-CM | POA: Diagnosis not present

## 2020-04-13 DIAGNOSIS — F322 Major depressive disorder, single episode, severe without psychotic features: Secondary | ICD-10-CM | POA: Diagnosis not present

## 2020-04-13 DIAGNOSIS — M25811 Other specified joint disorders, right shoulder: Secondary | ICD-10-CM | POA: Diagnosis not present

## 2020-04-13 DIAGNOSIS — F99 Mental disorder, not otherwise specified: Secondary | ICD-10-CM | POA: Diagnosis not present

## 2020-04-13 DIAGNOSIS — F5105 Insomnia due to other mental disorder: Secondary | ICD-10-CM | POA: Diagnosis not present

## 2020-04-13 DIAGNOSIS — R630 Anorexia: Secondary | ICD-10-CM | POA: Diagnosis not present

## 2020-04-13 DIAGNOSIS — M7541 Impingement syndrome of right shoulder: Secondary | ICD-10-CM | POA: Diagnosis not present

## 2020-04-13 DIAGNOSIS — M7542 Impingement syndrome of left shoulder: Secondary | ICD-10-CM | POA: Diagnosis not present

## 2020-04-13 MED ORDER — MIRTAZAPINE 30 MG PO TABS: 30.0000 mg | ORAL_TABLET | Freq: Every day | ORAL | 1 refills | Status: DC

## 2020-04-13 NOTE — Progress Notes (Signed)
Virtual Visit via Telephone Note  I connected with Jason Farmer on 04/13/20 at  2:00 PM EDT by telephone and verified that I am speaking with the correct person using two identifiers.   I discussed the limitations, risks, security and privacy concerns of performing an evaluation and management service by telephone and the availability of in person appointments. I also discussed with the patient that there may be a patient responsible charge related to this service. The patient expressed understanding and agreed to proceed.  Location patient: home Location provider: work or home office Participants present for the call: patient, provider Patient did not have a visit in the prior 7 days to address this/these issue(s).   History of Present Illness: 85 year old male who is being evaluated today for 1 month follow-up regarding depression, loss of appetite and sleep disorder.  During the last visit his PHQ-9 was 16 and it was thought that all of his symptoms were due to depression related to his wife's health.  He was started on Remeron 15 mg daily.  Today he reports that overall his mood is much improved and he is not feeling as depressed.  He does continue to go through "episodes" where he feels sad but nothing like he used to a month ago.  He still is having trouble falling asleep at night and is not uncommon for him to go to bed around 10:00 and still be awake at 2 or 3:00 in the morning.  His appetite is also not improved much when he is still forcing himself to eat.   Observations/Objective: Patient sounds cheerful and well on the phone. I do not appreciate any SOB. Speech and thought processing are grossly intact. Patient reported vitals:  Assessment and Plan: 1. Depression, major, single episode, severe ([place]) -We will increase Remeron to 30 mg to get better symptom management for depression, insomnia, and loss of appetite.  He will follow-up with me if he does not notice any improvement.  - mirtazapine (REMERON) 30 MG tablet; Take 1 tablet (30 mg total) by mouth at bedtime.  Dispense: 90 tablet; Refill: 1  2. Insomnia due to other mental disorder  - mirtazapine (REMERON) 30 MG tablet; Take 1 tablet (30 mg total) by mouth at bedtime.  Dispense: 90 tablet; Refill: 1  3. Loss of appetite  - mirtazapine (REMERON) 30 MG tablet; Take 1 tablet (30 mg total) by mouth at bedtime.  Dispense: 90 tablet; Refill: 1   Follow Up Instructions:   I did not refer this patient for an OV in the next 24 hours for this/these issue(s).  I discussed the assessment and treatment plan with the patient. The patient was provided an opportunity to ask questions and all were answered. The patient agreed with the plan and demonstrated an understanding of the instructions.   The patient was advised to call back or seek an in-person evaluation if the symptoms worsen or if the condition fails to improve as anticipated.  I provided 18 minutes of non-face-to-face time during this encounter.   Dorothyann Peng, NP

## 2020-04-14 DIAGNOSIS — Z23 Encounter for immunization: Secondary | ICD-10-CM | POA: Diagnosis not present

## 2020-04-20 ENCOUNTER — Telehealth: Payer: Self-pay | Admitting: Adult Health

## 2020-04-20 NOTE — Progress Notes (Signed)
  Chronic Care Management   Outreach Note  04/20/2020 Name: Jason Farmer MRN: [id number] DOB: Mar 10, 1935  Referred by: Dorothyann Peng, NP Reason for referral : No chief complaint on file.   An unsuccessful telephone outreach was attempted today. The patient was referred to the pharmacist for assistance with care management and care coordination.   Follow Up Plan:   Carley Perdue [employer] Scheduler

## 2020-05-15 ENCOUNTER — Telehealth: Payer: Self-pay | Admitting: Adult Health

## 2020-05-15 NOTE — Progress Notes (Signed)
°  Chronic Care Management   Note  05/15/2020 Name: [name] MRN: [id number] DOB: [birth year]  Jason Farmer is a 85 y.o. year old male who is a primary care patient of Dorothyann Peng, NP. I reached out to [name] by phone today in response to a referral sent by Mr. Jason Lente Maund's PCP, Dorothyann Peng, NP.   Jason Farmer was given information about Chronic Care Management services today including:  1. CCM service includes personalized support from designated clinical staff supervised by his physician, including individualized plan of care and coordination with other care providers 2. 24/7 contact phone numbers for assistance for urgent and routine care needs. 3. Service will only be billed when office clinical staff spend 20 minutes or more in a month to coordinate care. 4. Only one practitioner may furnish and bill the service in a calendar month. 5. The patient may stop CCM services at any time (effective at the end of the month) by phone call to the office staff.   Patient agreed to services and verbal consent obtained.   Follow up plan:   Carley Perdue [employer] Scheduler

## 2020-05-16 DIAGNOSIS — Z23 Encounter for immunization: Secondary | ICD-10-CM | POA: Diagnosis not present

## 2020-06-19 ENCOUNTER — Other Ambulatory Visit: Payer: Self-pay

## 2020-06-19 ENCOUNTER — Telehealth: Payer: Self-pay

## 2020-06-19 ENCOUNTER — Ambulatory Visit (INDEPENDENT_AMBULATORY_CARE_PROVIDER_SITE_OTHER): Payer: Medicare Other | Admitting: Adult Health

## 2020-06-19 ENCOUNTER — Encounter: Payer: Self-pay | Admitting: Adult Health

## 2020-06-19 VITALS — BP 132/76 | HR 76 | Temp 98.6°F | Ht 67.5 in | Wt 142.0 lb

## 2020-06-19 DIAGNOSIS — R1310 Dysphagia, unspecified: Secondary | ICD-10-CM

## 2020-06-19 DIAGNOSIS — J988 Other specified respiratory disorders: Secondary | ICD-10-CM

## 2020-06-19 MED ORDER — HYDROCODONE-HOMATROPINE 5-1.5 MG/5ML PO SYRP: 5.0000 mL | ORAL_SOLUTION | Freq: Three times a day (TID) | ORAL | 0 refills | Status: DC | PRN

## 2020-06-19 MED ORDER — DOXYCYCLINE HYCLATE 100 MG PO CAPS: 100.0000 mg | ORAL_CAPSULE | Freq: Two times a day (BID) | ORAL | 0 refills | Status: DC

## 2020-06-19 NOTE — Patient Instructions (Signed)
I am going to prescribe an antibiotic called Doxycycline and prescribe a cough syrup. I am concerned that you have a bacterial infection in your respiratory system   Please follow up if not resolved in the next week or sooner if symptoms worsen.

## 2020-06-19 NOTE — Progress Notes (Signed)
Subjective:    Patient ID: Jason Farmer, male    DOB: Aug 20, 1935, 85 y.o.   MRN: [id number]  HPI 85 year old male who  has a past medical history of Anxiety, Arthritis, Chronic lower back pain, Essential tremor, GERD (gastroesophageal reflux disease), Glaucoma, Heart murmur, Hepatitis (~ 1958), HOH (hard of hearing), Hyperlipemia, Migraine, Seasonal allergies, TIA (transient ischemic attack) (04/2015), Wears glasses, and Wears hearing aid.  He presents to the office today for a productive cough that he has had for two weeks. His cough has been worse at night but intermittent during the day. He reports coughing up " yellow/green thick sputum that looked like glue"   He denies fevers or chills, shortness of breath or wheezing.   He does endorse fatigue since the cough started.   He also reports a long history of dysphagia that he feels as though it is getting worse. He has history of esophageal dilation. Reports now that he has to drink a lot of water with his meals and will often feel like he is choking when eating. If his pills are too large he will end up coughing them back up     Review of Systems See HPI   Past Medical History:  Diagnosis Date  . Anxiety    "not a problem for a long time" (11/27/2016)  . Arthritis    "wrists, knees, shoulders, back" (11/27/2016)  . Chronic lower back pain    "5th disc is protruding" (11/27/2016)  . Essential tremor   . GERD (gastroesophageal reflux disease)   . Glaucoma   . Heart murmur    dx'd in [employer] in the 1960s; haven't been seen for it since "(11/27/2016)  . Hepatitis ~ 1958   "jaundice kind"  . HOH (hard of hearing)   . Hyperlipemia   . Migraine    "none since my neck OR" (11/27/2016)  . Seasonal allergies   . TIA (transient ischemic attack) 04/2015  . Wears glasses   . Wears hearing aid     Social History   Socioeconomic History  . Marital status: Married    Spouse name: Not on file  . Number of children: Not on file  . Years of  education: Not on file  . Highest education level: Not on file  Occupational History  . Not on file  Tobacco Use  . Smoking status: Former Smoker    Packs/day: 2.00    Years: 30.00    Pack years: 60.00    Types: Cigarettes    Quit date: 06/03/1984    Years since quitting: 36.0  . Smokeless tobacco: Never Used  . Tobacco comment: had AAA at the [facility]; also noted 2018 on Dr. Kyla Balzarine note   Substance and Sexual Activity  . Alcohol use: No    Comment: 11/27/2016 "quit in 1985"  . Drug use: No  . Sexual activity: Not Currently  Other Topics Concern  . Not on file  Social History Narrative   Retired from [employer]   Married    4 children, one son lives locally   Social Determinants of Health   Financial Resource Strain:   . Difficulty of Paying Living Expenses: Not on file  Food Insecurity:   . Worried About Charity fundraiser in the Last Year: Not on file  . Ran Out of Food in the Last Year: Not on file  Transportation Needs:   . Lack of Transportation (Medical): Not on file  . Lack of Transportation (  Non-Medical): Not on file  Physical Activity:   . Days of Exercise per Week: Not on file  . Minutes of Exercise per Session: Not on file  Stress:   . Feeling of Stress : Not on file  Social Connections:   . Frequency of Communication with Friends and Family: Not on file  . Frequency of Social Gatherings with Friends and Family: Not on file  . Attends Religious Services: Not on file  . Active Member of Clubs or Organizations: Not on file  . Attends Archivist Meetings: Not on file  . Marital Status: Not on file  Intimate Partner Violence:   . Fear of Current or Ex-Partner: Not on file  . Emotionally Abused: Not on file  . Physically Abused: Not on file  . Sexually Abused: Not on file    Past Surgical History:  Procedure Laterality Date  . ANTERIOR CERVICAL DECOMP/DISCECTOMY FUSION  2007  . APPENDECTOMY    . BACK SURGERY    . CARPAL TUNNEL RELEASE Right  10/12/2013   Procedure: RIGHT CARPAL TUNNEL RELEASE;  Surgeon: Wynonia Sours, MD;  Location: [place];  Service: Orthopedics;  Laterality: Right;  . CATARACT EXTRACTION W/ INTRAOCULAR LENS  IMPLANT, BILATERAL Bilateral   . COLONOSCOPY    . HEMORRHOID SURGERY    . KNEE ARTHROSCOPY Left 1970s  . NASAL SEPTOPLASTY W/ TURBINOPLASTY Bilateral 02/15/2013   Procedure: NASAL SEPTOPLASTY WITH BILATER TURBINATE REDUCTION;  Surgeon: Ascencion Dike, MD;  Location: [place];  Service: ENT;  Laterality: Bilateral;  . TONSILLECTOMY    . TRIGGER FINGER RELEASE  06/08/2012   Procedure: RELEASE TRIGGER FINGER/A-1 PULLEY;  Surgeon: Wynonia Sours, MD;  Location: [place];  Service: Orthopedics;  Laterality: Left;  Release A-1 Pulley Left Long Finger  . TRIGGER FINGER RELEASE  07/14/2012   Procedure: RELEASE TRIGGER FINGER/A-1 PULLEY;  Surgeon: Wynonia Sours, MD;  Location: [place];  Service: Orthopedics;  Laterality: Right;  . TRIGGER FINGER RELEASE Right 10/12/2013   Procedure: RELEASE TRIGGER FINGER/A-1 PULLEY RIGHT MIDDLE FINGER;  Surgeon: Wynonia Sours, MD;  Location: [place];  Service: Orthopedics;  Laterality: Right;  . UPPER GASTROINTESTINAL ENDOSCOPY      Family History  Problem Relation Age of Onset  . Dementia Father   . Heart failure Father   . Glaucoma Mother   . Colon cancer Neg Hx     Allergies  Allergen Reactions  . Statins Other (See Comments)    Muscle cramps     Current Outpatient Medications on File Prior to Visit  Medication Sig Dispense Refill  . atenolol (TENORMIN) 25 MG tablet TAKE (1/2) TABLET DAILY. 45 tablet 3  . Cholecalciferol (VITAMIN D3) 25 MCG (1000 UT) CAPS Take 1 capsule by mouth daily.    Marland Kitchen ipratropium (ATROVENT) 0.06 % nasal spray Place into the nose.    . latanoprost (XALATAN) 0.005 % ophthalmic solution Place 1 drop into both eyes at bedtime.     Marland Kitchen omeprazole (PRILOSEC) 20 MG capsule  Take 1 capsule (20 mg total) by mouth daily. 90 capsule 3  . amitriptyline (ELAVIL) 25 MG tablet Take 1 tablet (25 mg total) by mouth at bedtime. 90 tablet 3  . ezetimibe (ZETIA) 10 MG tablet Take 1 tablet (10 mg total) by mouth daily. 90 tablet 3  . mirtazapine (REMERON) 30 MG tablet Take 1 tablet (30 mg total) by mouth at bedtime. 90 tablet 1  .  simvastatin (ZOCOR) 10 MG tablet Take 1 tablet (10 mg total) by mouth every other day. 45 tablet 3   No current facility-administered medications on file prior to visit.    BP 132/76 (BP Location: Left Arm, Patient Position: Sitting, Cuff Size: Normal)   Pulse 76   Temp 98.6 F (37 C) (Oral)   Ht 5' 7.5" (1.715 m)   Wt 142 lb (64.4 kg)   SpO2 97%   BMI 21.91 kg/m       Objective:   Physical Exam Vitals and nursing note reviewed.  Constitutional:      Appearance: Normal appearance.  Cardiovascular:     Rate and Rhythm: Normal rate and regular rhythm.     Pulses: Normal pulses.     Heart sounds: Normal heart sounds.  Pulmonary:     Effort: Pulmonary effort is normal.     Breath sounds: Wheezing (wheezing throughout ) and rhonchi (trace throughout?) present.  Neurological:     Mental Status: He is alert.  Psychiatric:        Mood and Affect: Mood normal.        Behavior: Behavior normal.        Thought Content: Thought content normal.        Judgment: Judgment normal.        Assessment & Plan:  1. Respiratory infection - will treat due to symptoms and duration.  - Advised to follow up  - doxycycline (VIBRAMYCIN) 100 MG capsule; Take 1 capsule (100 mg total) by mouth 2 (two) times daily.  Dispense: 20 capsule; Refill: 0 - HYDROcodone-homatropine (HYCODAN) 5-1.5 MG/5ML syrup; Take 5 mLs by mouth every 8 (eight) hours as needed for cough.  Dispense: 120 mL; Refill: 0   2. Dysphagia, unspecified type - Ambulatory referral to Gastroenterology

## 2020-06-19 NOTE — Telephone Encounter (Signed)
Pharmacy called in letting Dr. Gwyndolyn Kaufman that medication is on back order is there something else we can send in for the patient   Hycodan    Please advise

## 2020-06-21 ENCOUNTER — Other Ambulatory Visit: Payer: Self-pay | Admitting: Adult Health

## 2020-06-21 MED ORDER — PROMETHAZINE VC/CODEINE 6.25-5-10 MG/5ML PO SYRP: 5.0000 mL | ORAL_SOLUTION | Freq: Three times a day (TID) | ORAL | 0 refills | Status: DC | PRN

## 2020-07-12 ENCOUNTER — Telehealth: Payer: Self-pay | Admitting: Pharmacist

## 2020-07-12 NOTE — Chronic Care Management (AMB) (Signed)
    Chronic Care Management Pharmacy Assistant   Name: Jason Farmer  MRN: [id number] DOB: 1935-07-07  Reason for Encounter: Medication Review/Initial Questions for Pharmacist visit on 07/13/2020  Patient Questions: 1. Have you seen any other providers since your last visit? No 2. Any changes in your medications or health? No 3. Any side effects from any medications? No 4. Do you have any symptoms or problems not managed by your medications? No 5. Any concerns about your health right now?  No 6. Has your provider asked that you check blood pressure, blood sugar, or follow a special diet at home? No 7. Do you get any type of exercise regularly?   . He tries to walk at least three miles a day 8. Can you think of a goal you would like to reach for your health? . He would like to be able to play golf again. 9. Do you have any problems getting your medications? No  10. Is there anything that you would like to discuss during the appointment? No  The patient was asked to please bring medications, blood pressure/ blood sugar log, and supplements to his appointment.  PCP : Dorothyann Peng, NP  Allergies:   Allergies  Allergen Reactions  . Statins Other (See Comments)    Muscle cramps     Medications: Outpatient Encounter Medications as of 07/12/2020  Medication Sig  . amitriptyline (ELAVIL) 25 MG tablet Take 1 tablet (25 mg total) by mouth at bedtime.  Marland Kitchen atenolol (TENORMIN) 25 MG tablet TAKE (1/2) TABLET DAILY.  Marland Kitchen Cholecalciferol (VITAMIN D3) 25 MCG (1000 UT) CAPS Take 1 capsule by mouth daily.  Marland Kitchen doxycycline (VIBRAMYCIN) 100 MG capsule Take 1 capsule (100 mg total) by mouth 2 (two) times daily.  Marland Kitchen ezetimibe (ZETIA) 10 MG tablet Take 1 tablet (10 mg total) by mouth daily.  Marland Kitchen ipratropium (ATROVENT) 0.06 % nasal spray Place into the nose.  . latanoprost (XALATAN) 0.005 % ophthalmic solution Place 1 drop into both eyes at bedtime.   . mirtazapine (REMERON) 30 MG tablet Take 1 tablet  (30 mg total) by mouth at bedtime.  Marland Kitchen omeprazole (PRILOSEC) 20 MG capsule Take 1 capsule (20 mg total) by mouth daily.  . Promethazine-Phenyleph-Codeine (PROMETHAZINE VC/CODEINE) 6.25-5-10 MG/5ML SYRP Take 5 mLs by mouth 3 (three) times daily as needed.  . simvastatin (ZOCOR) 10 MG tablet Take 1 tablet (10 mg total) by mouth every other day.   No facility-administered encounter medications on file as of 07/12/2020.    Current Diagnosis: Patient Active Problem List   Diagnosis Date Noted  . 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  . Essential hypertension, benign 12/07/2014  . Muscle cramps 03/14/2014  . Allergic rhinitis, seasonal 04/06/2012  . PERSONAL HX COLONIC POLYPS 08/06/2010  . GERD 11/05/2009  . RESTLESS LEG SYNDROME 10/04/2009  . Hyperlipidemia 08/23/2009  . ANXIETY DISORDER, GENERALIZED 08/23/2009  . TREMOR, ESSENTIAL 08/23/2009  . 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

## 2020-07-13 ENCOUNTER — Ambulatory Visit: Payer: Medicare Other | Admitting: Pharmacist

## 2020-07-13 ENCOUNTER — Other Ambulatory Visit: Payer: Self-pay

## 2020-07-13 DIAGNOSIS — F5105 Insomnia due to other mental disorder: Secondary | ICD-10-CM

## 2020-07-13 DIAGNOSIS — I10 Essential (primary) hypertension: Secondary | ICD-10-CM

## 2020-07-13 DIAGNOSIS — F99 Mental disorder, not otherwise specified: Secondary | ICD-10-CM

## 2020-07-13 NOTE — Patient Instructions (Addendum)
Hi Zaahir,  It was lovely to get to meet you in person! Below is a summary of some of the topics we discussed. I do think it would be beneficial for you to check your blood pressure at home a couple of times a month just to make sure your medication is working and I also want to encourage you to continue diet and exercise modifications to lower your cholesterol. Also, as a reminder, don't forget to look into getting your shingles shot (2 doses) and tetanus shot at your pharmacy.   Please give me a call if you need anything from me before our follow up!  Best, Maddie  [name], PharmD [facility] Clinical Pharmacist [place] [facility] [place]    Visit Information  Goals Addressed   None     Mr. Agosto was given information about Chronic Care Management services today including:  1. CCM service includes personalized support from designated clinical staff supervised by his physician, including individualized plan of care and coordination with other care providers 2. 24/7 contact phone numbers for assistance for urgent and routine care needs. 3. Standard insurance, coinsurance, copays and deductibles apply for chronic care management only during months in which we provide at least 20 minutes of these services. Most insurances cover these services at 100%, however patients may be responsible for any copay, coinsurance and/or deductible if applicable. This service may help you avoid the need for more expensive face-to-face services. 4. Only one practitioner may furnish and bill the service in a calendar month. 5. The patient may stop CCM services at any time (effective at the end of the month) by phone call to the office staff.  Patient agreed to services and verbal consent obtained.   The patient verbalized understanding of instructions, educational materials, and care plan provided today and agreed to receive a mailed copy of patient instructions, educational  materials, and care plan.  Telephone follow up appointment with pharmacy team member scheduled for: 3 months   Cholesterol Content in Foods Cholesterol is a waxy, fat-like substance that helps to carry fat in the blood. The body needs cholesterol in small amounts, but too much cholesterol can cause damage to the arteries and heart. Most people should eat less than 200 milligrams (mg) of cholesterol a day. Foods with cholesterol  Cholesterol is found in animal-based foods, such as meat, seafood, and dairy. Generally, low-fat dairy and lean meats have less cholesterol than full-fat dairy and fatty meats. The milligrams of cholesterol per serving (mg per serving) of common cholesterol-containing foods are listed below. Meat and other proteins  Egg -- one large whole egg has 186 mg.  Veal shank -- 4 oz has 141 mg.  Lean ground [place] (93% lean) -- 4 oz has 118 mg.  Fat-trimmed lamb loin -- 4 oz has 106 mg.  Lean ground beef (90% lean) -- 4 oz has 100 mg.  Lobster -- 3.5 oz has 90 mg.  Pork loin chops -- 4 oz has 86 mg.  Canned salmon -- 3.5 oz has 83 mg.  Fat-trimmed beef top loin -- 4 oz has 78 mg.  Frankfurter -- 1 frank (3.5 oz) has 77 mg.  Crab -- 3.5 oz has 71 mg.  Roasted chicken without skin, white meat -- 4 oz has 66 mg.  Light bologna -- 2 oz has 45 mg.  Deli-cut [place] -- 2 oz has 31 mg.  Canned tuna -- 3.5 oz has 31 mg.  Berniece Salines -- 1 oz has 29 mg.  Oysters and mussels (raw) -- 3.5 oz has 25 mg.  Mackerel -- 1 oz has 22 mg.  Trout -- 1 oz has 20 mg.  Pork sausage -- 1 link (1 oz) has 17 mg.  Salmon -- 1 oz has 16 mg.  Tilapia -- 1 oz has 14 mg. Dairy  Soft-serve ice cream --  cup (4 oz) has 103 mg.  Whole-milk yogurt -- 1 cup (8 oz) has 29 mg.  Cheddar cheese -- 1 oz has 28 mg.  American cheese -- 1 oz has 28 mg.  Whole milk -- 1 cup (8 oz) has 23 mg.  2% milk -- 1 cup (8 oz) has 18 mg.  Cream cheese -- 1 tablespoon (Tbsp) has 15  mg.  Cottage cheese --  cup (4 oz) has 14 mg.  Low-fat (1%) milk -- 1 cup (8 oz) has 10 mg.  Sour cream -- 1 Tbsp has 8.5 mg.  Low-fat yogurt -- 1 cup (8 oz) has 8 mg.  Nonfat Greek yogurt -- 1 cup (8 oz) has 7 mg.  Half-and-half cream -- 1 Tbsp has 5 mg. Fats and oils  Cod liver oil -- 1 tablespoon (Tbsp) has 82 mg.  Butter -- 1 Tbsp has 15 mg.  Lard -- 1 Tbsp has 14 mg.  Bacon grease -- 1 Tbsp has 14 mg.  Mayonnaise -- 1 Tbsp has 5-10 mg.  Margarine -- 1 Tbsp has 3-10 mg. Exact amounts of cholesterol in these foods may vary depending on specific ingredients and brands. Foods without cholesterol Most plant-based foods do not have cholesterol unless you combine them with a food that has cholesterol. Foods without cholesterol include:  Grains and cereals.  Vegetables.  Fruits.  Vegetable oils, such as olive, canola, and sunflower oil.  Legumes, such as peas, beans, and lentils.  Nuts and seeds.  Egg whites. Summary  The body needs cholesterol in small amounts, but too much cholesterol can cause damage to the arteries and heart.  Most people should eat less than 200 milligrams (mg) of cholesterol a day. This information is not intended to replace advice given to you by your health care provider. Make sure you discuss any questions you have with your health care provider. Document Revised: 07/24/2017 Document Reviewed: 04/07/2017 Elsevier Patient Education  [employer].

## 2020-07-13 NOTE — Chronic Care Management (AMB) (Signed)
Chronic Care Management Pharmacy  Name: Jason Farmer  MRN: [id number] DOB: November 10, 1934  Initial Planning Appointment: completed 07/12/20  Initial Questions: 1. Have you seen any other providers since your last visit? n/a 2. Any changes in your medicines or health? No   Chief Complaint/ HPI  Jason Farmer,  85 y.o. , male presents for their Initial CCM visit with the clinical pharmacist In office.  PCP : Dorothyann Peng, NP  Their chronic conditions include: HTN, HLD, depression/insomnia, ocular migraine, GERD, allergic rhinitis, glaucoma, vitamin D deficiency  Office Visits: -06/19/20 Dorothyann Peng, NP: Patient presented with respiratory infection. Prescribed doxycycline x 10 days and Promethazine w/codeine PRN for cough. Referral placed for gastroenterology.   -04/13/20 Dorothyann Peng, NP: Patient presented for video visit for follow up for depression, loss of appetite and sleep disorder. Patient is much improved since being placed on Remeron 15 mg a month ago. Remeron increased to 30 mg at bedtime.  -03/16/20 Dorothyann Peng, NP: Patient presented with insomnia, poor appetite and abdominal pain. Prescribed mirtazapine 15 mg at bedtime.  Consult Visit: -04/13/20 Victorino December (ortho): Patient presented for shoulder follow up. Unable to access notes.  -02/07/20 Sharyne Peach (ophthalmology): Patient presented for glaucoma follow up. Unable to access notes.  -01/31/20 Gertie Fey, PA [facility]):  Patient presented for arthritis follow up. Unable to access notes.  -7/82/95 Jodi Marble (otolaryngology): Patient presented for follow up for nasal drainage. Unable to access notes.    Medications: Outpatient Encounter Medications as of 07/13/2020  Medication Sig  . amitriptyline (ELAVIL) 25 MG tablet Take 1 tablet (25 mg total) by mouth at bedtime.  Marland Kitchen atenolol (TENORMIN) 25 MG tablet TAKE (1/2) TABLET DAILY.  Marland Kitchen Cholecalciferol (VITAMIN D3) 50 MCG (2000 UT) TABS Take 1 capsule by  mouth daily.   Marland Kitchen ezetimibe (ZETIA) 10 MG tablet Take 1 tablet (10 mg total) by mouth daily.  Marland Kitchen latanoprost (XALATAN) 0.005 % ophthalmic solution Place 1 drop into both eyes at bedtime.   . mirtazapine (REMERON) 30 MG tablet Take 1 tablet (30 mg total) by mouth at bedtime.  Marland Kitchen omeprazole (PRILOSEC) 20 MG capsule Take 1 capsule (20 mg total) by mouth daily.  . simvastatin (ZOCOR) 10 MG tablet Take 1 tablet (10 mg total) by mouth every other day.  . [DISCONTINUED] doxycycline (VIBRAMYCIN) 100 MG capsule Take 1 capsule (100 mg total) by mouth 2 (two) times daily.  . [DISCONTINUED] ipratropium (ATROVENT) 0.06 % nasal spray Place into the nose.  . [DISCONTINUED] Promethazine-Phenyleph-Codeine (PROMETHAZINE VC/CODEINE) 6.25-5-10 MG/5ML SYRP Take 5 mLs by mouth 3 (three) times daily as needed.   No facility-administered encounter medications on file as of 07/13/2020.    Patient is the primary caregiver for his wife. His wife has had 2 strokes and is not able to walk very well and so he does all of the cooking at home and cleaning. He reports making easy foods such as hot dogs, hamburgers, and corn. He does admit to ordering takeout a lot.   He currently walks 3-4 miles a day at two different times of the day. He brings his wife with him on the walks. He does sleep a lot better since starting the mirtazapine and doesn't normally get up at night. He admits to having to go to the bathroom during the night occasionally but only once every 2 or 3 weeks.  Patient is also currently the only driver in the household and does not have any issues with driving. He brings his wife  grocery shopping. He currently denies any problems with his medications other than the vitamin B complex getting stuck in his throat at night and he sometimes coughs it up.   Current Diagnosis/Assessment:  Goals Addressed            This Visit's Progress   . Pharmacy Care Plan       CARE PLAN ENTRY (see longitudinal plan of care for  additional care plan information)  Current Barriers:  . Chronic Disease Management support, education, and care coordination needs related to Hypertension, Hyperlipidemia, GERD, Depression, and vitamin D deficiency   Hypertension BP Readings from Last 3 Encounters:  07/24/20 120/74  06/19/20 132/76  03/16/20 124/76   . Pharmacist Clinical Goal(s): o Over the next 90 days, patient will work with PharmD and providers to maintain BP goal <140/90 . Current regimen:  o Atenolol 25 mg 1/2 tablet daily . Interventions: o Discussed DASH eating plan recommendations: . Emphasizes vegetables, fruits, and whole-grains . Includes fat-free or low-fat dairy products, fish, poultry, beans, nuts, and vegetable oils . Limits foods that are high in saturated fat. These foods include fatty meats, full-fat dairy products, and tropical oils such as coconut, palm kernel, and palm oils. . Limits sugar-sweetened beverages and sweets . Limiting sodium intake to < 1500 mg/day . Patient self care activities - Over the next 90 days, patient will: o Check BP weekly, document, and provide at future appointments o Ensure daily salt intake < 2300 mg/day  Hyperlipidemia Lab Results  Component Value Date/Time   LDLCALC 161 (H) 08/30/2019 08:26 AM   LDLDIRECT 139.5 09/01/2013 08:52 AM   . Pharmacist Clinical Goal(s): o Over the next 90 days, patient will work with PharmD and providers to achieve LDL goal < 70 . Current regimen:  . Simvastatin 10 mg 1 tablet every other day . Ezetimibe 10 mg 1 tablet daily . Interventions: o Discussed lowering cholesterol through diet by: Marland Kitchen Limiting foods with cholesterol such as liver and other organ meats, egg yolks, shrimp, and whole milk dairy products . Avoiding saturated fats and trans fats and incorporating healthier fats, such as lean meat, nuts, and unsaturated oils like canola and olive oils . Eating foods with soluble fiber such as whole-grain cereals such as oatmeal  and oat bran, fruits such as apples, bananas, oranges, pears, and prunes, legumes such as kidney beans, lentils, chick peas, black-eyed peas, and lima beans, and green leafy vegetables . Limiting alcohol intake . Patient self care activities - Over the next 90 days, patient will: o Continue current medication  Depression/insomnia . Pharmacist Clinical Goal(s): o Over the next 90 days, patient will work with PharmD and providers to improve quality and quantity of sleep and improve mood . Current regimen:  o Mirtazapine 30 mg 1 tablet at bedtime . Interventions: o Discussed practicing good sleep hygiene by setting a sleep schedule and maintaining it, avoid excessive napping, following a nightly routine, avoiding screen time for 30-60 minutes before going to bed, and making the bedroom a cool, quiet and dark space . Patient self care activities - Over the next 90 days, patient will: o Continue current medication  GERD . Pharmacist Clinical Goal(s) o Over the next 90 days, patient will work with PharmD and providers to manage symptoms of indigestion or heartburn . Current regimen:  o Omeprazole 20 mg 1 capsule daily . Interventions: o Discussed non-pharmacologic management of symptoms such as elevating the head of your bed, avoiding eating 2-3 hours before bed, avoiding  triggering foods such as acidic, spicy, or fatty foods, eating smaller meals, and wearing clothes that are loose around the waist  . Patient self care activities - Over the next 90 days, patient will: o Continue current medication  Vitamin D deficiency . Pharmacist Clinical Goal(s) o Over the next 90 days, patient will work with PharmD and providers to maintain vitamin D level > 30 . Current regimen:  o Vitamin D 2000 units 1 capsule daily . Interventions: o Discussed drinking plenty of water to avoid leg cramps and how they could be a sign that his vitamin D is low . Patient self care activities - Over the next 90 days,  patient will: o Continue current medication  Medication management . Pharmacist Clinical Goal(s): o Over the next 90 days, patient will work with PharmD and providers to maintain optimal medication adherence . Current pharmacy: [facility] . Interventions o Comprehensive medication review performed. o Continue current medication management strategy . Patient self care activities - Over the next 90 days, patient will: o Take medications as prescribed o Report any questions or concerns to PharmD and/or provider(s)  Initial goal documentation        SDOH Interventions     Most Recent Value  SDOH Interventions  Financial Strain Interventions Intervention Not Indicated  Transportation Interventions Intervention Not Indicated     Hypertension   BP goal is:  <140/90  Office blood pressures are  BP Readings from Last 3 Encounters:  07/24/20 120/74  06/19/20 132/76  03/16/20 124/76   Patient checks BP at home infrequently Patient home BP readings are ranging: n/a  Patient has failed these meds in the past: none Patient is currently controlled on the following medications:  . Atenolol 25 mg 1/2 tablet daily  We discussed diet and exercise extensively  -Diet: has cut back on salt a lot and does not eat canned foods; he does admit to looking at package labels for sodium content -DASH eating plan recommendations: . Emphasizes vegetables, fruits, and whole-grains . Includes fat-free or low-fat dairy products, fish, poultry, beans, nuts, and vegetable oils . Limits foods that are high in saturated fat. These foods include fatty meats, full-fat dairy products, and tropical oils such as coconut, palm kernel, and palm oils. . Limits sugar-sweetened beverages and sweets . Limiting sodium intake to < 1500 mg/day -Discussed recommendations for moderate aerobic exercise for 150 minutes/week spread out over 5 days for heart healthy lifestyle  Plan  Continue current medications     Hyperlipidemia   LDL goal < 70 (CAD and TIA hx)  Last lipids Lab Results  Component Value Date   CHOL 240 (H) 08/30/2019   HDL 54.50 08/30/2019   LDLCALC 161 (H) 08/30/2019   LDLDIRECT 139.5 09/01/2013   TRIG 124.0 08/30/2019   CHOLHDL 4 08/30/2019   Hepatic Function Latest Ref Rng & Units 03/16/2020 08/30/2019 08/04/2018  Total Protein 6.1 - 8.1 g/dL 6.0(L) 6.3 6.5  Albumin 3.5 - 5.2 g/dL - 4.0 3.5  AST 10 - 35 U/L 12 14 16   ALT 9 - 46 U/L 12 11 13   Alk Phosphatase 39 - 117 U/L - 51 44  Total Bilirubin 0.2 - 1.2 mg/dL 0.3 0.6 1.1  Bilirubin, Direct 0.0 - 0.3 mg/dL - - -     The ASCVD Risk score Mikey Bussing DC Jr., et al., 2013) failed to calculate for the following reasons:   The 2013 ASCVD risk score is only valid for ages 6 to 71  Patient has failed these meds in past: statins (muscle cramps) Patient is currently uncontrolled on the following medications:  . Simvastatin 10 mg 1 tablet every other day . Ezetimibe 10 mg 1 tablet daily  We discussed:  diet and exercise extensively  -older brother has hx of high cholesterol -Lowering cholesterol through diet by: Marland Kitchen Limiting foods with cholesterol such as liver and other organ meats, egg yolks, shrimp, and whole milk dairy products . Avoiding saturated fats and trans fats and incorporating healthier fats, such as lean meat, nuts, and unsaturated oils like canola and olive oils . Eating foods with soluble fiber such as whole-grain cereals such as oatmeal and oat bran, fruits such as apples, bananas, oranges, pears, and prunes, legumes such as kidney beans, lentils, chick peas, black-eyed peas, and lima beans, and green leafy vegetables . Limiting alcohol intake -Diet: Eats more chicken than red meat and does not drink alcohol  Plan Recommend repeat lipid panel as it has not been checked since starting Zetia. Could consider PCSK9-i therapy depending on results of lipid panel. Continue current medications   Depression/insomnia    Depression screen [facility] 2/9 06/19/2020 03/16/2020 08/30/2019  Decreased Interest 1 2 0  Down, Depressed, Hopeless 3 3 0  PHQ - 2 Score 4 5 0  Altered sleeping 2 3 -  Tired, decreased energy 1 3 -  Change in appetite 1 3 -  Feeling bad or failure about yourself  0 1 -  Trouble concentrating 2 1 -  Moving slowly or fidgety/restless 0 0 -  Suicidal thoughts 0 0 -  PHQ-9 Score 10 16 -  Difficult doing work/chores Somewhat difficult - -    Patient has failed these meds in past: none Patient is currently controlled on the following medications:  Marland Kitchen Mirtazapine 30 mg 1 tablet at bedtime  We discussed:  Practicing good sleep hygiene by setting a sleep schedule and maintaining it, avoid excessive napping, following a nightly routine, avoiding screen time for 30-60 minutes before going to bed, and making the bedroom a cool, quiet and dark space  Plan Continue current medications  Ocular migraine   Patient has failed these meds in past: none Patient is currently controlled on the following medications:  . Amitriptyline 25 mg 1 tablet at bedtime  We discussed:  Patient restarted taking 1 full tablet at bedtime and denies any dizziness, dry mouth or drowsiness at this time  Plan  Continue current medications   GERD   Patient has failed these meds in past: none Patient is currently controlled on the following medications:  . Omeprazole 20 mg 1 capsule daily  We discussed:  Non-pharmacologic management of symptoms such as elevating the head of your bed, avoiding eating 2-3 hours before bed, avoiding triggering foods such as acidic, spicy, or fatty foods, eating smaller meals, and wearing clothes that are loose around the waist; patient does not have heartburn symptoms - discussed long term risks of PPIs and was ok with stopping  Plan Will discuss with PCP about taper plan for omeprazole. Continue current medications  Allergic rhinitis   Patient has failed these meds in past:  none Patient is currently controlled on the following medications:  . Atrovent nasal spray PRN . Claritin 10 mg 1 tablet daily  We discussed:  Avoiding allergy triggers  Plan  Continue current medications   Glaucoma   Patient has failed these meds in past: none Patient is currently controlled on the following medications:  . Latanoprost 0.005% 1 drop  in both eyes at bedtime  Plan  Continue current medications   Vitamin D deficiency   Last vitamin D Lab Results  Component Value Date   VD25OH 20.33 (L) 02/09/2018    Patient is currently uncontrolled on the following medications:  Marland Kitchen Vitamin D 2000 units 1 capsule daily  We discussed:  Patient gets muscle cramps often at night  Plan Recommend repeat vitamin D given history of low vitamin D and muscle cramping.   Continue current medications   Vaccines   Reviewed and discussed patient's vaccination history.    Immunization History  Administered Date(s) Administered  . Influenza Split 09/03/2011  . Influenza Whole 08/01/2010  . Influenza, High Dose Seasonal PF 06/14/2013, 06/13/2015, 05/08/2016, 05/20/2017, 06/07/2018, 05/25/2020  . Influenza, Seasonal, Injecte, Preservative Fre 09/06/2012  . Influenza,inj,Quad PF,6+ Mos 05/29/2014  . Moderna SARS-COV2 Booster Vaccination 04/14/2020  . PFIZER SARS-COV-2 Vaccination 09/14/2019, 10/05/2019  . Pneumococcal Conjugate-13 09/08/2013  . Pneumococcal Polysaccharide-23 07/25/2009  . Zoster 09/06/2008   Verified doses of COVID vaccine in [facility]. Added to immunization history.  Plan  Recommended patient receive Shingrix and tetanus vaccine in office/at pharmacy.   Medication Management   Patient's preferred pharmacy is:  [facility], [place] [address] Phone: [phone] Fax: [phone]  Uses pill box? Yes - wife manages once a week Pt endorses 90% compliance - twice a  month  We discussed: Current pharmacy is preferred with insurance plan and patient is satisfied with pharmacy services  Plan  Continue current medication management strategy   Follow up: 3 month phone visit  [name], PharmD Clinical Pharmacist [place] [facility] [place] [phone]

## 2020-07-24 ENCOUNTER — Encounter: Payer: Self-pay | Admitting: Gastroenterology

## 2020-07-24 ENCOUNTER — Ambulatory Visit (INDEPENDENT_AMBULATORY_CARE_PROVIDER_SITE_OTHER): Payer: Medicare Other | Admitting: Gastroenterology

## 2020-07-24 VITALS — BP 120/74 | HR 100 | Ht 67.5 in | Wt 143.0 lb

## 2020-07-24 DIAGNOSIS — R1310 Dysphagia, unspecified: Secondary | ICD-10-CM | POA: Diagnosis not present

## 2020-07-24 NOTE — Addendum Note (Signed)
Addended by: Dorisann Frames L on: 07/24/2020 01:47 PM   Modules accepted: Orders

## 2020-07-24 NOTE — Progress Notes (Signed)
History of Present Illness: This is an 85 year old male referred by Dorothyann Peng, NP for the evaluation of dysphagia.  He relates difficulties swallowing bread and certain pills for a few years.  He takes a drink of water with all bites of food for several years.  Over the past few months he notes that pills are more difficult to swallow and occasionally a pill will get stuck, he will cough and the pill come up.  He relates a decreased appetite and a 20 pound weight loss during the current COVID pandemic however his weight has stabilized around 140.  No other gastrointestinal complaints.  He underwent EGD with dilation of an EGJ stricture in 2012.  A small hiatal hernia was also noted on that exam. He has a history of reflux and has been treated with omeprazole daily for about 5 years with very good control of symptoms. Denies abdominal pain, constipation, diarrhea, change in stool caliber, melena, hematochezia, nausea, vomiting, chest pain.    Allergies  Allergen Reactions  . Statins Other (See Comments)    Muscle cramps    Outpatient Medications Prior to Visit  Medication Sig Dispense Refill  . amitriptyline (ELAVIL) 25 MG tablet Take 1 tablet (25 mg total) by mouth at bedtime. 90 tablet 3  . atenolol (TENORMIN) 25 MG tablet TAKE (1/2) TABLET DAILY. 45 tablet 3  . Cholecalciferol (VITAMIN D3) 50 MCG (2000 UT) TABS Take 1 capsule by mouth daily.     Marland Kitchen ezetimibe (ZETIA) 10 MG tablet Take 1 tablet (10 mg total) by mouth daily. 90 tablet 3  . latanoprost (XALATAN) 0.005 % ophthalmic solution Place 1 drop into both eyes at bedtime.     Marland Kitchen omeprazole (PRILOSEC) 20 MG capsule Take 1 capsule (20 mg total) by mouth daily. 90 capsule 3  . mirtazapine (REMERON) 30 MG tablet Take 1 tablet (30 mg total) by mouth at bedtime. 90 tablet 1  . simvastatin (ZOCOR) 10 MG tablet Take 1 tablet (10 mg total) by mouth every other day. 45 tablet 3   No facility-administered medications prior to visit.   Past  Medical History:  Diagnosis Date  . Anxiety    "not a problem for a long time" (11/27/2016)  . Arthritis    "wrists, knees, shoulders, back" (11/27/2016)  . Chronic lower back pain    "5th disc is protruding" (11/27/2016)  . Essential tremor   . GERD (gastroesophageal reflux disease)   . Glaucoma   . Heart murmur    dx'd in [employer] in the 1960s; haven't been seen for it since "(11/27/2016)  . Hepatitis ~ 1958   "jaundice kind"  . HOH (hard of hearing)   . Hyperlipemia   . Migraine    "none since my neck OR" (11/27/2016)  . Seasonal allergies   . TIA (transient ischemic attack) 04/2015  . Wears glasses   . Wears hearing aid    Past Surgical History:  Procedure Laterality Date  . ANTERIOR CERVICAL DECOMP/DISCECTOMY FUSION  2007  . APPENDECTOMY    . BACK SURGERY    . CARPAL TUNNEL RELEASE Right 10/12/2013   Procedure: RIGHT CARPAL TUNNEL RELEASE;  Surgeon: Wynonia Sours, MD;  Location: [facility];  Service: Orthopedics;  Laterality: Right;  . CATARACT EXTRACTION W/ INTRAOCULAR LENS  IMPLANT, BILATERAL Bilateral   . COLONOSCOPY    . HEMORRHOID SURGERY    . KNEE ARTHROSCOPY Left 1970s  . NASAL SEPTOPLASTY W/ TURBINOPLASTY Bilateral 02/15/2013   Procedure:  NASAL SEPTOPLASTY WITH BILATER TURBINATE REDUCTION;  Surgeon: Ascencion Dike, MD;  Location: [facility];  Service: ENT;  Laterality: Bilateral;  . TONSILLECTOMY    . TRIGGER FINGER RELEASE  06/08/2012   Procedure: RELEASE TRIGGER FINGER/A-1 PULLEY;  Surgeon: Wynonia Sours, MD;  Location: [facility];  Service: Orthopedics;  Laterality: Left;  Release A-1 Pulley Left Long Finger  . TRIGGER FINGER RELEASE  07/14/2012   Procedure: RELEASE TRIGGER FINGER/A-1 PULLEY;  Surgeon: Wynonia Sours, MD;  Location: [place];  Service: Orthopedics;  Laterality: Right;  . TRIGGER FINGER RELEASE Right 10/12/2013   Procedure: RELEASE TRIGGER FINGER/A-1 PULLEY RIGHT MIDDLE FINGER;  Surgeon: Wynonia Sours, MD;   Location: [place];  Service: Orthopedics;  Laterality: Right;  . UPPER GASTROINTESTINAL ENDOSCOPY     Social History   Socioeconomic History  . Marital status: Married    Spouse name: Not on file  . Number of children: 4  . Years of education: Not on file  . Highest education level: Not on file  Occupational History  . Not on file  Tobacco Use  . Smoking status: Former Smoker    Packs/day: 2.00    Years: 30.00    Pack years: 60.00    Types: Cigarettes    Quit date: 06/03/1984    Years since quitting: 36.1  . Smokeless tobacco: Never Used  . Tobacco comment: had AAA at the [facility]; also noted 2018 on Dr. Kyla Balzarine note   Substance and Sexual Activity  . Alcohol use: No    Comment: 11/27/2016 "quit in 1985"  . Drug use: No  . Sexual activity: Not Currently  Other Topics Concern  . Not on file  Social History Narrative   Retired from [employer]   Married    4 children, one son lives locally   Social Determinants of Health   Financial Resource Strain:   . Difficulty of Paying Living Expenses: Not on file  Food Insecurity:   . Worried About Charity fundraiser in the Last Year: Not on file  . Ran Out of Food in the Last Year: Not on file  Transportation Needs:   . Lack of Transportation (Medical): Not on file  . Lack of Transportation (Non-Medical): Not on file  Physical Activity:   . Days of Exercise per Week: Not on file  . Minutes of Exercise per Session: Not on file  Stress:   . Feeling of Stress : Not on file  Social Connections:   . Frequency of Communication with Friends and Family: Not on file  . Frequency of Social Gatherings with Friends and Family: Not on file  . Attends Religious Services: Not on file  . Active Member of Clubs or Organizations: Not on file  . Attends Archivist Meetings: Not on file  . Marital Status: Not on file   Family History  Problem Relation Age of Onset  . Dementia Father   . Heart failure Father   .  Glaucoma Mother   . Colon cancer Neg Hx       Review of Systems: Pertinent positive and negative review of systems were noted in the above HPI section. All other review of systems were otherwise negative.   Physical Exam: General: Well developed, well nourished, no acute distress Head: Normocephalic and atraumatic Eyes:  sclerae anicteric, EOMI Ears: Normal auditory acuity Mouth: Not examined, mask on during Covid-19 pandemic Neck: Supple, no masses or thyromegaly  Lungs: Clear throughout to auscultation Heart: Regular rate and rhythm; no murmurs, rubs or bruits Abdomen: Soft, non tender and non distended. No masses, hepatosplenomegaly or hernias noted. Normal Bowel sounds Rectal: Not done Musculoskeletal: Symmetrical with no gross deformities  Skin: No lesions on visible extremities Pulses:  Normal pulses noted Extremities: No clubbing, cyanosis, edema or deformities noted Neurological: Alert oriented x 4, grossly nonfocal Cervical Nodes:  No significant cervical adenopathy Inguinal Nodes: No significant inguinal adenopathy Psychological:  Alert and cooperative. Normal mood and affect   Assessment and Recommendations:  1. GERD, dysphagia, history of distal esophageal stricture.  Rule out recurrent esophageal stricture, esophageal motility disorder, oropharyngeal dysphagia, Zenker's and other disorders.  Continue omeprazole 20 mg daily.  Follow antireflux measures.  Schedule barium esophagram.  Schedule EGD with possible dilation. The risks (including bleeding, perforation, infection, missed lesions, medication reactions and possible hospitalization or surgery if complications occur), benefits, and alternatives to endoscopy with possible biopsy and possible dilation were discussed with the patient and they consent to proceed.    cc: Dorothyann Peng, NP [address],  [place] [address]

## 2020-07-24 NOTE — Patient Instructions (Signed)
You have been scheduled for an endoscopy. Please follow written instructions given to you at your visit today. If you use inhalers (even only as needed), please bring them with you on the day of your procedure.  You have been scheduled for a Barium Esophogram at [facility] (1st floor of the hospital) on 08/06/20 at 10:30am. Please arrive 15 minutes prior to your appointment for registration. Make certain not to have anything to eat or drink 3 hours prior to your test. If you need to reschedule for any reason, please contact radiology at [phone] to do so. __________________________________________________________________ A barium swallow is an examination that concentrates on views of the esophagus. This tends to be a double contrast exam (barium and two liquids which, when combined, create a gas to distend the wall of the oesophagus) or single contrast (non-ionic iodine based). The study is usually tailored to your symptoms so a good history is essential. Attention is paid during the study to the form, structure and configuration of the esophagus, looking for functional disorders (such as aspiration, dysphagia, achalasia, motility and reflux) EXAMINATION You may be asked to change into a gown, depending on the type of swallow being performed. A radiologist and radiographer will perform the procedure. The radiologist will advise you of the type of contrast selected for your procedure and direct you during the exam. You will be asked to stand, sit or lie in several different positions and to hold a small amount of fluid in your mouth before being asked to swallow while the imaging is performed .In some instances you may be asked to swallow barium coated marshmallows to assess the motility of a solid food bolus. The exam can be recorded as a digital or video fluoroscopy procedure. POST PROCEDURE It will take 1-2 days for the barium to pass through your system. To facilitate this, it is  important, unless otherwise directed, to increase your fluids for the next 24-48hrs and to resume your normal diet.  This test typically takes about 30 minutes to perform. __________________________________________________________________________________  Due to recent changes in healthcare laws, you may see the results of your imaging and laboratory studies on MyChart before your provider has had a chance to review them.  We understand that in some cases there may be results that are confusing or concerning to you. Not all laboratory results come back in the same time frame and the provider may be waiting for multiple results in order to interpret others.  Please give [name] 48 hours in order for your provider to thoroughly review all the results before contacting the office for clarification of your results.   Thank you for choosing me and [facility].  Pricilla Riffle. Dagoberto Ligas., MD., [facility]

## 2020-08-03 ENCOUNTER — Ambulatory Visit (INDEPENDENT_AMBULATORY_CARE_PROVIDER_SITE_OTHER): Payer: Medicare Other | Admitting: Adult Health

## 2020-08-03 ENCOUNTER — Other Ambulatory Visit: Payer: Self-pay

## 2020-08-03 ENCOUNTER — Encounter: Payer: Self-pay | Admitting: Adult Health

## 2020-08-03 VITALS — BP 140/72 | HR 89 | Temp 98.3°F | Ht 67.0 in | Wt 145.6 lb

## 2020-08-03 DIAGNOSIS — H8113 Benign paroxysmal vertigo, bilateral: Secondary | ICD-10-CM

## 2020-08-03 NOTE — Patient Instructions (Addendum)
It was great seeing you today   I will send you to vestibular rehab after you get done with your other appointments that are coming up.   Please follow up a month after your appointment with GI Jan 5th and we can see how you are doing now

## 2020-08-03 NOTE — Progress Notes (Signed)
Subjective:    Patient ID: Jason Farmer, male    DOB: 30-Jul-1935, 85 y.o.   MRN: [id number]  HPI 85 year old male who  has a past medical history of Anxiety, Arthritis, Chronic lower back pain, Essential tremor, GERD (gastroesophageal reflux disease), Glaucoma, Heart murmur, Hepatitis (~ 1958), HOH (hard of hearing), Hyperlipemia, Migraine, Seasonal allergies, TIA (transient ischemic attack) (04/2015), Wears glasses, and Wears hearing aid.  He is being reevaluated today for ongoing intermittent "dizzy spells" that come and go and usually happen in the morning.  Symptoms have been present for 6 months or slightly longer.  Feels as though the room is spinning when he gets these "dizzy spells".  Has been evaluated in the past by myself as well as ear nose and throat.  Has had MRI of the brain done, last in June 2020 which showed no acute abnormality.  Noticed that when he closes eyes or changes positions quickly the dizziness presents.  Has mild nausea at times.   Review of Systems See HPI   Past Medical History:  Diagnosis Date  . Anxiety    "not a problem for a long time" (11/27/2016)  . Arthritis    "wrists, knees, shoulders, back" (11/27/2016)  . Chronic lower back pain    "5th disc is protruding" (11/27/2016)  . Essential tremor   . GERD (gastroesophageal reflux disease)   . Glaucoma   . Heart murmur    dx'd in [employer] in the 1960s; haven't been seen for it since "(11/27/2016)  . Hepatitis ~ 1958   "jaundice kind"  . HOH (hard of hearing)   . Hyperlipemia   . Migraine    "none since my neck OR" (11/27/2016)  . Seasonal allergies   . TIA (transient ischemic attack) 04/2015  . Wears glasses   . Wears hearing aid     Social History   Socioeconomic History  . Marital status: Married    Spouse name: Not on file  . Number of children: 4  . Years of education: Not on file  . Highest education level: Not on file  Occupational History  . Not on file  Tobacco Use  . Smoking status:  Former Smoker    Packs/day: 2.00    Years: 30.00    Pack years: 60.00    Types: Cigarettes    Quit date: 06/03/1984    Years since quitting: 36.1  . Smokeless tobacco: Never Used  . Tobacco comment: had AAA at the [facility]; also noted 2018 on Dr. Kyla Balzarine note   Substance and Sexual Activity  . Alcohol use: No    Comment: 11/27/2016 "quit in 1985"  . Drug use: No  . Sexual activity: Not Currently  Other Topics Concern  . Not on file  Social History Narrative   Retired from [employer]   Married    4 children, one son lives locally   Social Determinants of Health   Financial Resource Strain: [place]   . Difficulty of Paying Living Expenses: Not hard at all  Food Insecurity: Not on file  Transportation Needs: No Transportation Needs  . Lack of Transportation (Medical): No  . Lack of Transportation (Non-Medical): No  Physical Activity: Not on file  Stress: Not on file  Social Connections: Not on file  Intimate Partner Violence: Not on file    Past Surgical History:  Procedure Laterality Date  . ANTERIOR CERVICAL DECOMP/DISCECTOMY FUSION  2007  . APPENDECTOMY    . BACK SURGERY    .  CARPAL TUNNEL RELEASE Right 10/12/2013   Procedure: RIGHT CARPAL TUNNEL RELEASE;  Surgeon: Wynonia Sours, MD;  Location: [place];  Service: Orthopedics;  Laterality: Right;  . CATARACT EXTRACTION W/ INTRAOCULAR LENS  IMPLANT, BILATERAL Bilateral   . COLONOSCOPY    . HEMORRHOID SURGERY    . KNEE ARTHROSCOPY Left 1970s  . NASAL SEPTOPLASTY W/ TURBINOPLASTY Bilateral 02/15/2013   Procedure: NASAL SEPTOPLASTY WITH BILATER TURBINATE REDUCTION;  Surgeon: Ascencion Dike, MD;  Location: [place];  Service: ENT;  Laterality: Bilateral;  . TONSILLECTOMY    . TRIGGER FINGER RELEASE  06/08/2012   Procedure: RELEASE TRIGGER FINGER/A-1 PULLEY;  Surgeon: Wynonia Sours, MD;  Location: [place];  Service: Orthopedics;  Laterality: Left;  Release A-1 Pulley Left Long  Finger  . TRIGGER FINGER RELEASE  07/14/2012   Procedure: RELEASE TRIGGER FINGER/A-1 PULLEY;  Surgeon: Wynonia Sours, MD;  Location: [place];  Service: Orthopedics;  Laterality: Right;  . TRIGGER FINGER RELEASE Right 10/12/2013   Procedure: RELEASE TRIGGER FINGER/A-1 PULLEY RIGHT MIDDLE FINGER;  Surgeon: Wynonia Sours, MD;  Location: [place];  Service: Orthopedics;  Laterality: Right;  . UPPER GASTROINTESTINAL ENDOSCOPY      Family History  Problem Relation Age of Onset  . Dementia Father   . Heart failure Father   . Glaucoma Mother   . Colon cancer Neg Hx     Allergies  Allergen Reactions  . Statins Other (See Comments)    Muscle cramps     Current Outpatient Medications on File Prior to Visit  Medication Sig Dispense Refill  . amitriptyline (ELAVIL) 25 MG tablet Take 1 tablet (25 mg total) by mouth at bedtime. 90 tablet 3  . atenolol (TENORMIN) 25 MG tablet TAKE (1/2) TABLET DAILY. 45 tablet 3  . Cholecalciferol (VITAMIN D3) 50 MCG (2000 UT) TABS Take 1 capsule by mouth daily.     Marland Kitchen ezetimibe (ZETIA) 10 MG tablet Take 1 tablet (10 mg total) by mouth daily. 90 tablet 3  . latanoprost (XALATAN) 0.005 % ophthalmic solution Place 1 drop into both eyes at bedtime.     Marland Kitchen loratadine (CLARITIN) 10 MG tablet Take 10 mg by mouth daily.    Marland Kitchen omeprazole (PRILOSEC) 20 MG capsule Take 1 capsule (20 mg total) by mouth daily. 90 capsule 3  . mirtazapine (REMERON) 30 MG tablet Take 1 tablet (30 mg total) by mouth at bedtime. 90 tablet 1  . simvastatin (ZOCOR) 10 MG tablet Take 1 tablet (10 mg total) by mouth every other day. 45 tablet 3   No current facility-administered medications on file prior to visit.    BP 140/72   Pulse 89   Temp 98.3 F (36.8 C) (Oral)   Ht 5\' 7"  (1.702 m)   Wt 145 lb 9.6 oz (66 kg)   SpO2 98%   BMI 22.80 kg/m       Objective:   Physical Exam Vitals and nursing note reviewed.  Constitutional:      Appearance: He is  well-developed.  HENT:     Head: Normocephalic and atraumatic.     Right Ear: Ear canal and external ear normal. A middle ear effusion is present. There is no impacted cerumen.     Left Ear: Tympanic membrane, ear canal and external ear normal.  No middle ear effusion. There is no impacted cerumen.     Mouth/Throat:     Mouth: Mucous membranes are moist.  Pharynx: Oropharynx is clear.  Eyes:     Extraocular Movements: Extraocular movements intact.     Right eye: Nystagmus (horizontal ) present.     Left eye: Nystagmus (horizontal ) present.     Pupils: Pupils are equal, round, and reactive to light.  Skin:    General: Skin is warm and dry.     Capillary Refill: Capillary refill takes less than 2 seconds.  Neurological:     General: No focal deficit present.     Mental Status: He is alert and oriented to person, place, and time.  Psychiatric:        Mood and Affect: Mood normal.        Behavior: Behavior normal.        Thought Content: Thought content normal.        Judgment: Judgment normal.       Assessment & Plan:  1. Benign paroxysmal positional vertigo due to bilateral vestibular disorder -We discussed his disorder at length.  He is interested in vestibular rehab but would like to wait until he is done with his upcoming appointments with gastroenterology.  Did not want any medication today to help with symptoms.  He will follow-up with me in January or February when he is ready to go to vestibular rehab.  In the meantime he was given information on home Epley maneuver  Dorothyann Peng, NP  Time spent on chart review, time with patient; discussion of BPPV, home exercises,  follow up plan, and documentation 30 minutes

## 2020-08-06 ENCOUNTER — Ambulatory Visit (HOSPITAL_COMMUNITY)
Admission: RE | Admit: 2020-08-06 | Discharge: 2020-08-06 | Disposition: A | Discharge status: Home / Self Care | Payer: Medicare Other | Source: Ambulatory Visit | Attending: Gastroenterology | Admitting: Gastroenterology

## 2020-08-06 ENCOUNTER — Other Ambulatory Visit: Payer: Self-pay

## 2020-08-06 DIAGNOSIS — K219 Gastro-esophageal reflux disease without esophagitis: Secondary | ICD-10-CM | POA: Diagnosis not present

## 2020-08-06 DIAGNOSIS — R1310 Dysphagia, unspecified: Secondary | ICD-10-CM | POA: Insufficient documentation

## 2020-08-15 ENCOUNTER — Other Ambulatory Visit: Payer: Self-pay | Admitting: Adult Health

## 2020-08-15 DIAGNOSIS — I10 Essential (primary) hypertension: Secondary | ICD-10-CM

## 2020-08-29 ENCOUNTER — Ambulatory Visit (AMBULATORY_SURGERY_CENTER): Payer: Medicare Other | Admitting: Gastroenterology

## 2020-08-29 ENCOUNTER — Other Ambulatory Visit: Payer: Self-pay

## 2020-08-29 ENCOUNTER — Encounter: Payer: Self-pay | Admitting: Gastroenterology

## 2020-08-29 VITALS — BP 121/88 | HR 63 | Temp 97.5°F | Resp 18 | Ht 67.0 in | Wt 143.0 lb

## 2020-08-29 DIAGNOSIS — K219 Gastro-esophageal reflux disease without esophagitis: Secondary | ICD-10-CM | POA: Diagnosis not present

## 2020-08-29 DIAGNOSIS — R1310 Dysphagia, unspecified: Secondary | ICD-10-CM | POA: Diagnosis not present

## 2020-08-29 DIAGNOSIS — K2970 Gastritis, unspecified, without bleeding: Secondary | ICD-10-CM | POA: Diagnosis not present

## 2020-08-29 DIAGNOSIS — K449 Diaphragmatic hernia without obstruction or gangrene: Secondary | ICD-10-CM

## 2020-08-29 DIAGNOSIS — K222 Esophageal obstruction: Secondary | ICD-10-CM | POA: Diagnosis not present

## 2020-08-29 DIAGNOSIS — K317 Polyp of stomach and duodenum: Secondary | ICD-10-CM | POA: Diagnosis not present

## 2020-08-29 MED ORDER — SODIUM CHLORIDE 0.9 % IV SOLN: 500.0000 mL | Freq: Once | INTRAVENOUS | Status: DC

## 2020-08-29 NOTE — Progress Notes (Signed)
Called to room to assist during endoscopic procedure.  Patient ID and intended procedure confirmed with present staff. Received instructions for my participation in the procedure from the performing physician.  

## 2020-08-29 NOTE — Op Note (Signed)
[facility] Patient Name: Jason Farmer Procedure Date: 08/29/2020 9:47 AM MRN: [id number] Endoscopist: Meryl Dare , MD Age: 86 Referring MD:  Date of Birth: 1934/11/10 Gender: Male Account #: [id number] Procedure:                Upper GI endoscopy Indications:              Dysphagia Medicines:                Monitored Anesthesia Care Procedure:                Pre-Anesthesia Assessment:                           - Prior to the procedure, a History and Physical                            was performed, and patient medications and                            allergies were reviewed. The patient's tolerance of                            previous anesthesia was also reviewed. The risks                            and benefits of the procedure and the sedation                            options and risks were discussed with the patient.                            All questions were answered, and informed consent                            was obtained. Prior Anticoagulants: The patient has                            taken no previous anticoagulant or antiplatelet                            agents. ASA Grade Assessment: II - A patient with                            mild systemic disease. After reviewing the risks                            and benefits, the patient was deemed in                            satisfactory condition to undergo the procedure.                           After obtaining informed consent, the endoscope was  passed under direct vision. Throughout the                            procedure, the patient's blood pressure, pulse, and                            oxygen saturations were monitored continuously. The                            Endoscope was introduced through the mouth, and                            advanced to the second part of duodenum. The upper                            GI endoscopy was accomplished without  difficulty.                            The patient tolerated the procedure well. Scope In: Scope Out: Findings:                 One benign-appearing, intrinsic mild stenosis was                            found 39 cm from the incisors. This stenosis                            measured 1.4 cm (inner diameter) x less than one cm                            (in length). The stenosis was traversed. A                            guidewire was placed and the scope was withdrawn.                            Dilations were performed with Savary dilators with                            no resistance at 16 mm and 17 mm.                           The exam of the esophagus was otherwise normal.                           Patchy mild inflammation characterized by erythema                            and granularity was found in the gastric body.                            Biopsies were taken with a cold forceps for  histology from the antrum and body.                           Multiple 4 to 5 mm sessile polyps with no bleeding                            and no stigmata of recent bleeding were found in                            the gastric fundus and in the gastric body. The                            larger polyps were biopsied. Biopsies were taken                            with a cold forceps for histology.                           A medium-sized hiatal hernia was present.                           The exam of the stomach was otherwise normal.                           The duodenal bulb and second portion of the                            duodenum were normal. Complications:            No immediate complications. Estimated Blood Loss:     Estimated blood loss was minimal. Impression:               - Benign-appearing esophageal stenosis. Dilated.                           - Gastritis. Biopsied.                           - Multiple gastric polyps. Biopsied.                            - Medium-sized hiatal hernia.                           - Normal duodenal bulb and second portion of the                            duodenum. Recommendation:           - Patient has a contact number available for                            emergencies. The signs and symptoms of potential                            delayed complications were discussed with the  patient. Return to normal activities tomorrow.                            Written discharge instructions were provided to the                            patient.                           - Clear liquid diet for 2 hours, then advance as                            tolerated to soft diet today.                           - Resume prior diet tomorrow.                           - Follow antireflux measures long term.                           - Continue present medications.                           - Await pathology results. Ladene Artist, MD 08/29/2020 10:15:29 AM This report has been signed electronically.

## 2020-08-29 NOTE — Progress Notes (Signed)
VS by MO   I have reviewed the patient's medical history in detail and updated the computerized patient record.  

## 2020-08-29 NOTE — Progress Notes (Signed)
Report to PACU, RN, vss, BBS= Clear.  

## 2020-08-29 NOTE — Patient Instructions (Signed)
Thank you for allowing [name] to care for you today!  Await biopsy results,approximately 7-10 days.  Follow soft diet today, starting with clear liquids x 2 hours, then soft foods until tomorrow.  Handout given.  Resume normal diet tomorrow.  Resume medications today.  Resume normal activities tomorrow.  Follow anti-reflux measures long term , handout given.     YOU HAD AN ENDOSCOPIC PROCEDURE TODAY AT THE [facility]:   Refer to the procedure report that was given to you for any specific questions about what was found during the examination.  If the procedure report does not answer your questions, please call your gastroenterologist to clarify.  If you requested that your care partner not be given the details of your procedure findings, then the procedure report has been included in a sealed envelope for you to review at your convenience later.  YOU SHOULD EXPECT: Some feelings of bloating in the abdomen. Passage of more gas than usual.  Walking can help get rid of the air that was put into your GI tract during the procedure and reduce the bloating. If you had a lower endoscopy (such as a colonoscopy or flexible sigmoidoscopy) you may notice spotting of blood in your stool or on the toilet paper. If you underwent a bowel prep for your procedure, you may not have a normal bowel movement for a few days.  Please Note:  You might notice some irritation and congestion in your nose or some drainage.  This is from the oxygen used during your procedure.  There is no need for concern and it should clear up in a day or so.  SYMPTOMS TO REPORT IMMEDIATELY:    Following upper endoscopy (EGD)  Vomiting of blood or coffee ground material  New chest pain or pain under the shoulder blades  Painful or persistently difficult swallowing  New shortness of breath  Fever of 100F or higher  Black, tarry-looking stools  For urgent or emergent issues, a gastroenterologist can be reached at any  hour by calling [phone]. Do not use MyChart messaging for urgent concerns.    DIET:  We do recommend a small meal at first, but then you may proceed to your regular diet.  Drink plenty of fluids but you should avoid alcoholic beverages for 24 hours.  ACTIVITY:  You should plan to take it easy for the rest of today and you should NOT DRIVE or use heavy machinery until tomorrow (because of the sedation medicines used during the test).    FOLLOW UP: Our staff will call the number listed on your records 48-72 hours following your procedure to check on you and address any questions or concerns that you may have regarding the information given to you following your procedure. If we do not reach you, we will leave a message.  We will attempt to reach you two times.  During this call, we will ask if you have developed any symptoms of COVID 19. If you develop any symptoms (ie: fever, flu-like symptoms, shortness of breath, cough etc.) before then, please call [phone].  If you test positive for Covid 19 in the 2 weeks post procedure, please call and report this information to [name].    If any biopsies were taken you will be contacted by phone or by letter within the next 1-3 weeks.  Please call us at [phone] if you have not heard about the biopsies in 3 weeks.    SIGNATURES/CONFIDENTIALITY: You and/or your care partner have  signed paperwork which will be entered into your electronic medical record.  These signatures attest to the fact that that the information above on your After Visit Summary has been reviewed and is understood.  Full responsibility of the confidentiality of this discharge information lies with you and/or your care-partner.

## 2020-08-30 ENCOUNTER — Other Ambulatory Visit: Payer: Self-pay | Admitting: Adult Health

## 2020-08-30 DIAGNOSIS — K219 Gastro-esophageal reflux disease without esophagitis: Secondary | ICD-10-CM

## 2020-08-31 ENCOUNTER — Telehealth: Payer: Self-pay

## 2020-08-31 NOTE — Telephone Encounter (Signed)
Left message on follow up call. 

## 2020-08-31 NOTE — Telephone Encounter (Signed)
I called the pt and scheduled him for his yearly physical.  Sent to the pharmacy by e-scribe.

## 2020-08-31 NOTE — Telephone Encounter (Signed)
LVM

## 2020-09-03 ENCOUNTER — Telehealth: Payer: Self-pay | Admitting: *Deleted

## 2020-09-03 NOTE — Telephone Encounter (Signed)
Please advise him to check with this PCP. I am concerned about sinus drainage, post nasal drip and other potential non GI factors causing his symptoms.

## 2020-09-03 NOTE — Telephone Encounter (Signed)
Pt is requesting a call back from a nurse to discuss his difficulty swallowing.

## 2020-09-03 NOTE — Telephone Encounter (Signed)
Pt called c/o being awakened in the middle of the night the past few nights "coughing and I feel like something is stuck in there." Will notify Dr. Fuller Plan. Post dilation on 08/30/20 with relief the day of the procedure.

## 2020-09-04 ENCOUNTER — Other Ambulatory Visit: Payer: Self-pay | Admitting: Adult Health

## 2020-09-04 DIAGNOSIS — G43109 Migraine with aura, not intractable, without status migrainosus: Secondary | ICD-10-CM

## 2020-09-04 NOTE — Telephone Encounter (Signed)
Left the patient a voicemail conveying to him that Dr. Fuller Plan wanted him to follow up with his pCP and that he's concerned that his symptoms maybe be caused by non GI factors.

## 2020-09-05 NOTE — Telephone Encounter (Signed)
Sent to the pharmacy by e-scribe for 30 days.  Pt has upcoming cpx. 

## 2020-09-06 ENCOUNTER — Encounter: Payer: Self-pay | Admitting: Gastroenterology

## 2020-09-11 ENCOUNTER — Other Ambulatory Visit: Payer: Self-pay | Admitting: Adult Health

## 2020-09-11 NOTE — Telephone Encounter (Signed)
SENT TO THE PHARMACY BY E-SCRIBE.  PT HAS AN UPCOMING APPOINTMENT.

## 2020-09-26 ENCOUNTER — Ambulatory Visit (INDEPENDENT_AMBULATORY_CARE_PROVIDER_SITE_OTHER): Payer: Medicare Other | Admitting: Adult Health

## 2020-09-26 ENCOUNTER — Encounter: Payer: Self-pay | Admitting: Adult Health

## 2020-09-26 ENCOUNTER — Other Ambulatory Visit: Payer: Self-pay

## 2020-09-26 VITALS — BP 118/78 | Temp 98.1°F | Ht 66.25 in | Wt 141.0 lb

## 2020-09-26 DIAGNOSIS — G43109 Migraine with aura, not intractable, without status migrainosus: Secondary | ICD-10-CM

## 2020-09-26 DIAGNOSIS — I10 Essential (primary) hypertension: Secondary | ICD-10-CM

## 2020-09-26 DIAGNOSIS — K219 Gastro-esophageal reflux disease without esophagitis: Secondary | ICD-10-CM | POA: Diagnosis not present

## 2020-09-26 DIAGNOSIS — R0982 Postnasal drip: Secondary | ICD-10-CM | POA: Diagnosis not present

## 2020-09-26 DIAGNOSIS — F322 Major depressive disorder, single episode, severe without psychotic features: Secondary | ICD-10-CM | POA: Diagnosis not present

## 2020-09-26 DIAGNOSIS — E559 Vitamin D deficiency, unspecified: Secondary | ICD-10-CM | POA: Diagnosis not present

## 2020-09-26 DIAGNOSIS — H401130 Primary open-angle glaucoma, bilateral, stage unspecified: Secondary | ICD-10-CM

## 2020-09-26 DIAGNOSIS — E785 Hyperlipidemia, unspecified: Secondary | ICD-10-CM

## 2020-09-26 LAB — CBC WITH DIFFERENTIAL/PLATELET
Basophils Absolute: 0 10*3/uL (ref 0.0–0.1)
Basophils Relative: 0.5 % (ref 0.0–3.0)
Eosinophils Absolute: 0.1 10*3/uL (ref 0.0–0.7)
Eosinophils Relative: 0.9 % (ref 0.0–5.0)
HCT: 43.8 % (ref 39.0–52.0)
Hemoglobin: 14.7 g/dL (ref 13.0–17.0)
Lymphocytes Relative: 28 % (ref 12.0–46.0)
Lymphs Abs: 1.7 10*3/uL (ref 0.7–4.0)
MCHC: 33.6 g/dL (ref 30.0–36.0)
MCV: 90.8 fl (ref 78.0–100.0)
Monocytes Absolute: 0.4 10*3/uL (ref 0.1–1.0)
Monocytes Relative: 6.8 % (ref 3.0–12.0)
Neutro Abs: 3.9 10*3/uL (ref 1.4–7.7)
Neutrophils Relative %: 63.8 % (ref 43.0–77.0)
Platelets: 266 10*3/uL (ref 150.0–400.0)
RBC: 4.82 Mil/uL (ref 4.22–5.81)
RDW: 13.3 % (ref 11.5–15.5)
WBC: 6.2 10*3/uL (ref 4.0–10.5)

## 2020-09-26 LAB — COMPREHENSIVE METABOLIC PANEL
ALT: 18 U/L (ref 0–53)
AST: 21 U/L (ref 0–37)
Albumin: 4.1 g/dL (ref 3.5–5.2)
Alkaline Phosphatase: 59 U/L (ref 39–117)
BUN: 18 mg/dL (ref 6–23)
CO2: 29 mEq/L (ref 19–32)
Calcium: 9.8 mg/dL (ref 8.4–10.5)
Chloride: 102 mEq/L (ref 96–112)
Creatinine, Ser: 1.34 mg/dL (ref 0.40–1.50)
GFR: 49.02 mL/min — ABNORMAL LOW (ref >60.00)
Glucose, Bld: 92 mg/dL (ref 70–99)
Potassium: 4.9 mEq/L (ref 3.5–5.1)
Sodium: 137 mEq/L (ref 135–145)
Total Bilirubin: 0.5 mg/dL (ref 0.2–1.2)
Total Protein: 6.6 g/dL (ref 6.0–8.3)

## 2020-09-26 LAB — LIPID PANEL
Cholesterol: 232 mg/dL — ABNORMAL HIGH (ref 0–200)
HDL: 50.3 mg/dL (ref >39.00)
LDL Cholesterol: 148 mg/dL — ABNORMAL HIGH (ref 0–99)
NonHDL: 181.61
Total CHOL/HDL Ratio: 5
Triglycerides: 169 mg/dL — ABNORMAL HIGH (ref 0.0–149.0)
VLDL: 33.8 mg/dL (ref 0.0–40.0)

## 2020-09-26 LAB — VITAMIN D 25 HYDROXY (VIT D DEFICIENCY, FRACTURES): VITD: 27.55 ng/mL — ABNORMAL LOW (ref 30.00–100.00)

## 2020-09-26 LAB — TSH: TSH: 1.4 u[IU]/mL (ref 0.35–4.50)

## 2020-09-26 MED ORDER — MONTELUKAST SODIUM 10 MG PO TABS: 10.0000 mg | ORAL_TABLET | Freq: Every day | ORAL | 0 refills | Status: DC

## 2020-09-26 NOTE — Progress Notes (Signed)
Subjective:    Patient ID: Jason Farmer, male    DOB: 1935/05/25, 86 y.o.   MRN: [id number]  HPI Patient presents for yearly preventative medicine examination. He is a pleasant 86 year old male who  has a past medical history of Anxiety, Arthritis, Chronic lower back pain, Essential tremor, GERD (gastroesophageal reflux disease), Glaucoma, Heart murmur, Hepatitis (~ 1958), HOH (hard of hearing), Hyperlipemia, Migraine, Seasonal allergies, TIA (transient ischemic attack) (04/2015), Wears glasses, and Wears hearing aid.  He is also seen at the [place] on a regular basis.   Hyperlipidemia - takes Simvastatin 10 mg every other day and Zetia daily. Does experience myalgia on occasion  Lab Results  Component Value Date   CHOL 240 (H) 08/30/2019   HDL 54.50 08/30/2019   LDLCALC 161 (H) 08/30/2019   LDLDIRECT 139.5 09/01/2013   TRIG 124.0 08/30/2019   CHOLHDL 4 08/30/2019   GERD - takes prilosec daily - feels controlled.   Essential Hypertension - Takes Atenolol 12.5 mg daily. He denies dizziness, lightheadedness,blurred vision, chest pain, or shortness of breath  BP Readings from Last 3 Encounters:  08/29/20 121/88  08/03/20 140/72  07/24/20 120/74   Primary Open Angle Glaucoma - is seen by Dr. Delman Cheadle on a regular basis. Currently prescribed Latanoprolst 0.005%- 1 drop at bedtime.   Ocular Migraines- takes Elavil 25 mg QHS - well controlled with this medication   Depression/Insomnia - takes Remeron 30 mg QHS. Feels as though his depresive symptoms are well controlled and he sleeps well with this medication  Wt Readings from Last 3 Encounters:  08/29/20 143 lb (64.9 kg)  08/03/20 145 lb 9.6 oz (66 kg)  07/24/20 143 lb (64.9 kg)   Vitamin D deficiency - takes Vitamin D 2000 units daily does report intermittent episodes of lower extremity cramping.   Chronic PND -been seen by two separate ENT's been prescribed multiple nasal sprays.  Was recently seen by gastroenterology for  dysphagia-like symptoms and had endoscopy performed on 08/29/2020 -this showed 1 benign appearing, intrinsic mild stenosis.  The exam was otherwise normal.  It was thought that his dysphagia was not due to GI issues and was advised to follow-up with his PCP.  Today he reports that he continues to have significant postnasal drip and feels as though his secretions are getting thicker is causing him to feel as though he is choking with p.o. intake.    All immunizations and health maintenance protocols were reviewed with the patient and needed orders were placed.  Appropriate screening laboratory values were ordered for the patient including screening of hyperlipidemia, renal function and hepatic function. If indicated by BPH, a PSA was ordered.  Medication reconciliation,  past medical history, social history, problem list and allergies were reviewed in detail with the patient  Goals were established with regard to weight loss, exercise, and  diet in compliance with medications. He walks about 3-4 miles a day    Review of Systems  Constitutional: Negative.   HENT: Positive for postnasal drip and trouble swallowing.   Eyes: Negative.   Respiratory: Negative.   Cardiovascular: Negative.   Gastrointestinal: Negative.   Endocrine: Negative.   Genitourinary: Negative.   Musculoskeletal: Positive for myalgias.  Skin: Negative.   Allergic/Immunologic: Negative.   Neurological: Negative.   Hematological: Negative.   Psychiatric/Behavioral: Negative.   All other systems reviewed and are negative.  Past Medical History:  Diagnosis Date  . Anxiety    "not a problem for a long time" (11/27/2016)  .  Arthritis    "wrists, knees, shoulders, back" (11/27/2016)  . Chronic lower back pain    "5th disc is protruding" (11/27/2016)  . Essential tremor   . GERD (gastroesophageal reflux disease)   . Glaucoma   . Heart murmur    dx'd in [employer] in the 1960s; haven't been seen for it since "(11/27/2016)  .  Hepatitis ~ 1958   "jaundice kind"  . HOH (hard of hearing)   . Hyperlipemia   . Migraine    "none since my neck OR" (11/27/2016)  . Seasonal allergies   . TIA (transient ischemic attack) 04/2015  . Wears glasses   . Wears hearing aid     Social History   Socioeconomic History  . Marital status: Married    Spouse name: Not on file  . Number of children: 4  . Years of education: Not on file  . Highest education level: Not on file  Occupational History  . Not on file  Tobacco Use  . Smoking status: Former Smoker    Packs/day: 2.00    Years: 30.00    Pack years: 60.00    Types: Cigarettes    Quit date: 06/03/1984    Years since quitting: 36.3  . Smokeless tobacco: Never Used  . Tobacco comment: had AAA at [facility]; also noted 2018 on Dr. Kyla Balzarine note   Substance and Sexual Activity  . Alcohol use: No    Comment: 11/27/2016 "quit in 1985"  . Drug use: No  . Sexual activity: Not Currently  Other Topics Concern  . Not on file  Social History Narrative   Retired from [employer]   Married    4 children, one son lives locally   Social Determinants of Health   Financial Resource Strain: [place]   . Difficulty of Paying Living Expenses: Not hard at all  Food Insecurity: Not on file  Transportation Needs: No Transportation Needs  . Lack of Transportation (Medical): No  . Lack of Transportation (Non-Medical): No  Physical Activity: Not on file  Stress: Not on file  Social Connections: Not on file  Intimate Partner Violence: Not on file    Past Surgical History:  Procedure Laterality Date  . ANTERIOR CERVICAL DECOMP/DISCECTOMY FUSION  2007  . APPENDECTOMY    . BACK SURGERY    . CARPAL TUNNEL RELEASE Right 10/12/2013   Procedure: RIGHT CARPAL TUNNEL RELEASE;  Surgeon: Wynonia Sours, MD;  Location: [facility];  Service: Orthopedics;  Laterality: Right;  . CATARACT EXTRACTION W/ INTRAOCULAR LENS  IMPLANT, BILATERAL Bilateral   . COLONOSCOPY    .  HEMORRHOID SURGERY    . KNEE ARTHROSCOPY Left 1970s  . NASAL SEPTOPLASTY W/ TURBINOPLASTY Bilateral 02/15/2013   Procedure: NASAL SEPTOPLASTY WITH BILATER TURBINATE REDUCTION;  Surgeon: Ascencion Dike, MD;  Location: [facility];  Service: ENT;  Laterality: Bilateral;  . TONSILLECTOMY    . TRIGGER FINGER RELEASE  06/08/2012   Procedure: RELEASE TRIGGER FINGER/A-1 PULLEY;  Surgeon: Wynonia Sours, MD;  Location: [facility];  Service: Orthopedics;  Laterality: Left;  Release A-1 Pulley Left Long Finger  . TRIGGER FINGER RELEASE  07/14/2012   Procedure: RELEASE TRIGGER FINGER/A-1 PULLEY;  Surgeon: Wynonia Sours, MD;  Location: [facility];  Service: Orthopedics;  Laterality: Right;  . TRIGGER FINGER RELEASE Right 10/12/2013   Procedure: RELEASE TRIGGER FINGER/A-1 PULLEY RIGHT MIDDLE FINGER;  Surgeon: Wynonia Sours, MD;  Location: [facility];  Service: Orthopedics;  Laterality: Right;  . UPPER GASTROINTESTINAL ENDOSCOPY      Family History  Problem Relation Age of Onset  . Dementia Father   . Heart failure Father   . Glaucoma Mother   . Colon cancer Neg Hx     Allergies  Allergen Reactions  . Statins Other (See Comments)    Muscle cramps     Current Outpatient Medications on File Prior to Visit  Medication Sig Dispense Refill  . amitriptyline (ELAVIL) 25 MG tablet TAKE ONE TABLET AT BEDTIME. 30 tablet 0  . atenolol (TENORMIN) 25 MG tablet TAKE (1/2) TABLET DAILY. 45 tablet 3  . Cholecalciferol (VITAMIN D3) 50 MCG (2000 UT) TABS Take 1 capsule by mouth daily.     Marland Kitchen ezetimibe (ZETIA) 10 MG tablet TAKE 1 TABLET EACH DAY. 90 tablet 0  . latanoprost (XALATAN) 0.005 % ophthalmic solution Place 1 drop into both eyes at bedtime.     Marland Kitchen loratadine (CLARITIN) 10 MG tablet Take 10 mg by mouth daily.    Marland Kitchen omeprazole (PRILOSEC) 20 MG capsule TAKE (1) CAPSULE DAILY. 90 capsule 0  . mirtazapine (REMERON) 30 MG tablet Take 1 tablet (30 mg total) by  mouth at bedtime. 90 tablet 1  . simvastatin (ZOCOR) 10 MG tablet Take 1 tablet (10 mg total) by mouth every other day. 45 tablet 3   No current facility-administered medications on file prior to visit.    BP 118/78   Temp 98.1 F (36.7 C)   Ht 5' 6.25" (1.683 m) Comment: WITH SHOES  Wt 141 lb (64 kg)   BMI 22.59 kg/m       Objective:   Physical Exam Vitals and nursing note reviewed.  Constitutional:      General: He is not in acute distress.    Appearance: Normal appearance. He is well-developed and normal weight.  HENT:     Head: Normocephalic and atraumatic.     Right Ear: Tympanic membrane, ear canal and external ear normal. There is no impacted cerumen.     Left Ear: Tympanic membrane, ear canal and external ear normal. There is no impacted cerumen.     Nose: Nose normal. No congestion or rhinorrhea.     Mouth/Throat:     Mouth: Mucous membranes are moist.     Pharynx: Oropharynx is clear. No oropharyngeal exudate or posterior oropharyngeal erythema.  Eyes:     General:        Right eye: No discharge.        Left eye: No discharge.     Extraocular Movements: Extraocular movements intact.     Conjunctiva/sclera: Conjunctivae normal.     Pupils: Pupils are equal, round, and reactive to light.  Neck:     Vascular: No carotid bruit.     Trachea: No tracheal deviation.  Cardiovascular:     Rate and Rhythm: Normal rate and regular rhythm.     Pulses: Normal pulses.     Heart sounds: Normal heart sounds. No murmur heard. No friction rub. No gallop.   Pulmonary:     Effort: Pulmonary effort is normal. No respiratory distress.     Breath sounds: Normal breath sounds. No stridor. No wheezing, rhonchi or rales.  Chest:     Chest wall: No tenderness.  Abdominal:     General: Bowel sounds are normal. There is no distension.     Palpations: Abdomen is soft. There is no mass.     Tenderness: There is no abdominal tenderness.  There is no right CVA tenderness, left CVA  tenderness, guarding or rebound.     Hernia: No hernia is present.  Musculoskeletal:        General: No swelling, tenderness, deformity or signs of injury. Normal range of motion.     Right lower leg: No edema.     Left lower leg: No edema.  Lymphadenopathy:     Cervical: No cervical adenopathy.  Skin:    General: Skin is warm and dry.     Capillary Refill: Capillary refill takes less than 2 seconds.     Coloration: Skin is not jaundiced or pale.     Findings: No bruising, erythema, lesion or rash.  Neurological:     General: No focal deficit present.     Mental Status: He is alert and oriented to person, place, and time.     Cranial Nerves: No cranial nerve deficit.     Sensory: No sensory deficit.     Motor: No weakness.     Coordination: Coordination normal.     Gait: Gait normal.     Deep Tendon Reflexes: Reflexes normal.  Psychiatric:        Mood and Affect: Mood normal.        Behavior: Behavior normal.        Thought Content: Thought content normal.        Judgment: Judgment normal.       Assessment & Plan:  1. Essential hypertension, benign - Well controlled. No change in medication - CBC with Differential/Platelet; Future - Comprehensive metabolic panel; Future - Lipid panel; Future - TSH; Future  - TSH - Lipid panel - Comprehensive metabolic panel - CBC with Differential/Platelet  2. Depression, major, single episode, severe ([place]) - Well controlled with Remeron   3. Hyperlipidemia, unspecified hyperlipidemia type - Consider increasing in statin  - CBC with Differential/Platelet; Future - Comprehensive metabolic panel; Future - Lipid panel; Future - TSH; Future - TSH - Lipid panel - Comprehensive metabolic panel - CBC with Differential/Platelet  4. Gastroesophageal reflux disease, unspecified whether esophagitis present - Continue with PPI - CBC with Differential/Platelet; Future - Comprehensive metabolic panel; Future - Lipid panel; Future -  TSH; Future - TSH - Lipid panel - Comprehensive metabolic panel - CBC with Differential/Platelet  5. Ocular migraine - Continue with Elavil nightly   6. Primary open angle glaucoma of both eyes, unspecified glaucoma stage - Follow up with Opthalmology as directed  7. Vitamin D deficiency  - VITAMIN D 25 Hydroxy (Vit-D Deficiency, Fractures); Future - VITAMIN D 25 Hydroxy (Vit-D Deficiency, Fractures)  8. Post-nasal drip - Multiple trials of nasal sprays. Has also tried claritin and zyrtec. Will try him with Singular for a month. He will let me know how he does on this medication  - montelukast (SINGULAIR) 10 MG tablet; Take 1 tablet (10 mg total) by mouth at bedtime.  Dispense: 30 tablet; Refill: 0  Dorothyann Peng, NP

## 2020-09-26 NOTE — Patient Instructions (Addendum)
It was great seeing you today   We will follow up with you regarding your blood work   I am going to prescribe you Singulair to see if this helps with the post nasal drip   Start using Benifiber every morning to help with bowel movements

## 2020-09-27 ENCOUNTER — Other Ambulatory Visit: Payer: Self-pay | Admitting: Family Medicine

## 2020-09-27 MED ORDER — SIMVASTATIN 20 MG PO TABS: 20.0000 mg | ORAL_TABLET | ORAL | 3 refills | Status: DC

## 2020-09-27 NOTE — Telephone Encounter (Signed)
Sent to the pharmacy by e-scribe.  Pt notified.

## 2020-10-09 ENCOUNTER — Other Ambulatory Visit: Payer: Self-pay | Admitting: Adult Health

## 2020-10-09 DIAGNOSIS — G43109 Migraine with aura, not intractable, without status migrainosus: Secondary | ICD-10-CM

## 2020-10-10 NOTE — Telephone Encounter (Signed)
In the past he has had muscle pain with taking it every day, which is why he is taking it every other day now. He can try going back to daily dosing if he would like

## 2020-10-10 NOTE — Telephone Encounter (Signed)
SENT TO THE PHARMACY BY E-SCRIBE. 

## 2020-10-12 ENCOUNTER — Telehealth: Payer: Self-pay | Admitting: Pharmacist

## 2020-10-12 NOTE — Chronic Care Management (AMB) (Addendum)
I left the patient a message about his upcoming appointment on 10/15/2020 @ 1:00 pm with the clinical pharmacist. He/She was asked to please have all medications on hand to review with the pharmacist.   Patient called back and confirmed appointment  Maia Breslow, [place] Assistant [phone]

## 2020-10-15 ENCOUNTER — Ambulatory Visit (INDEPENDENT_AMBULATORY_CARE_PROVIDER_SITE_OTHER): Payer: Medicare Other | Admitting: Pharmacist

## 2020-10-15 DIAGNOSIS — E785 Hyperlipidemia, unspecified: Secondary | ICD-10-CM

## 2020-10-15 DIAGNOSIS — I10 Essential (primary) hypertension: Secondary | ICD-10-CM

## 2020-10-15 NOTE — Progress Notes (Signed)
Chronic Care Management Pharmacy Note  10/23/2020 Name:  Jason Farmer MRN:  [id number] DOB:  12/14/1934  Subjective: Jason Farmer is an 86 y.o. year old male who is a primary patient of Dorothyann Peng, NP.  The CCM team was consulted for assistance with disease management and care coordination needs.    Engaged with patient by telephone for follow up visit in response to provider referral for pharmacy case management and/or care coordination services.   Consent to Services:  The patient was given information about Chronic Care Management services, agreed to services, and gave verbal consent prior to initiation of services.  Please see initial visit note for detailed documentation.   Patient Care Team: Dorothyann Peng, NP as PCP - General (Family Medicine) Rozetta Nunnery, MD as Consulting Physician (Otolaryngology) [facility] as Consulting Physician (Orthopedic Surgery) Viona Gilmore, [facility] as Pharmacist (Pharmacist)  Recent office visits: 09/26/20 Dorothyann Peng, NP: Patient presented for annual exam. Increased simvastatin to 20 mg every other night and increased vitamin D to 5000 units daily. Prescribed montelukast 10 mg at bedtime.  08/03/20 Dorothyann Peng, NP: Patient presented for BPH and dizziness.  06/19/20 Dorothyann Peng, NP: Patient presented with respiratory infection. Prescribed doxycycline x 10 days and Promethazine w/codeine PRN for cough. Referral placed for gastroenterology.   04/13/20 Dorothyann Peng, NP: Patient presented for video visit for follow up for depression, loss of appetite and sleep disorder. Patient is much improved since being placed on Remeron 15 mg a month ago. Remeron increased to 30 mg at bedtime  Recent consult visits: 08/29/20 Lucio Edward, MD (gastro): Patient presented for EGD.  07/24/20 Lucio Edward, mD (gastro): Patient presented for dysphagia evaluation.   04/13/20 Victorino December (ortho): Patient presented for shoulder follow up. Unable  to access notes.  02/07/20 Sharyne Peach (ophthalmology): Patient presented for glaucoma follow up. Unable to access notes.  Hospital visits: None in previous 6 months  Objective:  Lab Results  Component Value Date   CREATININE 1.34 09/26/2020   BUN 18 09/26/2020   GFR 49.02 (L) 09/26/2020   GFRNONAA 44 (L) 03/16/2020   GFRAA 51 (L) 03/16/2020   NA 137 09/26/2020   K 4.9 09/26/2020   CALCIUM 9.8 09/26/2020   CO2 29 09/26/2020    Lab Results  Component Value Date/Time   GFR 49.02 (L) 09/26/2020 10:47 AM   GFR 50.99 (L) 08/30/2019 08:26 AM    Last diabetic Eye exam: No results found for: HMDIABEYEEXA  Last diabetic Foot exam: No results found for: HMDIABFOOTEX   Lab Results  Component Value Date   CHOL 232 (H) 09/26/2020   HDL 50.30 09/26/2020   LDLCALC 148 (H) 09/26/2020   LDLDIRECT 139.5 09/01/2013   TRIG 169.0 (H) 09/26/2020   CHOLHDL 5 09/26/2020    Hepatic Function Latest Ref Rng & Units 09/26/2020 03/16/2020 08/30/2019  Total Protein 6.0 - 8.3 g/dL 6.6 6.0(L) 6.3  Albumin 3.5 - 5.2 g/dL 4.1 - 4.0  AST 0 - 37 U/L 21 12 14   ALT 0 - 53 U/L 18 12 11   Alk Phosphatase 39 - 117 U/L 59 - 51  Total Bilirubin 0.2 - 1.2 mg/dL 0.5 0.3 0.6  Bilirubin, Direct 0.0 - 0.3 mg/dL - - -    Lab Results  Component Value Date/Time   TSH 1.40 09/26/2020 10:47 AM   TSH 1.63 08/30/2019 08:26 AM    CBC Latest Ref Rng & Units 09/26/2020 03/16/2020 08/30/2019  WBC 4.0 - 10.5 K/uL 6.2 7.5 6.3  Hemoglobin 13.0 - 17.0 g/dL 14.7 14.3 14.4  Hematocrit 39.0 - 52.0 % 43.8 43.2 43.2  Platelets 150.0 - 400.0 K/uL 266.0 318 258.0    Lab Results  Component Value Date/Time   VD25OH 27.55 (L) 09/26/2020 10:47 AM   VD25OH 20.33 (L) 02/09/2018 01:33 PM    Clinical ASCVD: No  The ASCVD Risk score Mikey Bussing DC Jr., et al., 2013) failed to calculate for the following reasons:   The 2013 ASCVD risk score is only valid for ages 40 to 55    Depression screen PHQ 2/9 09/26/2020 08/03/2020 06/19/2020   Decreased Interest 0 0 1  Down, Depressed, Hopeless 0 0 3  PHQ - 2 Score 0 0 4  Altered sleeping - - 2  Tired, decreased energy - - 1  Change in appetite - - 1  Feeling bad or failure about yourself  - - 0  Trouble concentrating - - 2  Moving slowly or fidgety/restless - - 0  Suicidal thoughts - - 0  PHQ-9 Score - - 10  Difficult doing work/chores - - Somewhat difficult      Social History   Tobacco Use  Smoking Status Former Smoker  . Packs/day: 2.00  . Years: 30.00  . Pack years: 60.00  . Types: Cigarettes  . Quit date: 06/03/1984  . Years since quitting: 36.4  Smokeless Tobacco Never Used  Tobacco Comment   had AAA at the [place]; also noted 2018 on Dr. Kyla Balzarine note    BP Readings from Last 3 Encounters:  09/26/20 118/78  08/29/20 121/88  08/03/20 140/72   Pulse Readings from Last 3 Encounters:  08/29/20 63  08/03/20 89  07/24/20 100   Wt Readings from Last 3 Encounters:  09/26/20 141 lb (64 kg)  08/29/20 143 lb (64.9 kg)  08/03/20 145 lb 9.6 oz (66 kg)    Assessment/Interventions: Review of patient past medical history, allergies, medications, health status, including review of consultants reports, laboratory and other test data, was performed as part of comprehensive evaluation and provision of chronic care management services.   SDOH:  (Social Determinants of Health) assessments and interventions performed: No   CCM Care Plan  Allergies  Allergen Reactions  . Statins Other (See Comments)    Muscle cramps     Medications Reviewed Today    Reviewed by Dorothyann Peng, NP (Nurse Practitioner) on 09/26/20 at 1642  Med List Status: <None>  Medication Order Taking? Sig Documenting Provider Last Dose Status Informant  amitriptyline (ELAVIL) 25 MG tablet [id number] Yes TAKE ONE TABLET AT BEDTIME. Nafziger, Tommi Rumps, NP Taking Active   atenolol (TENORMIN) 25 MG tablet [id number] Yes TAKE (1/2) TABLET DAILY. Nafziger, Tommi Rumps, NP Taking Active   Cholecalciferol  (VITAMIN D3) 50 MCG (2000 UT) TABS [id number] Yes Take 1 capsule by mouth daily.  [provider] Taking Active Self  ezetimibe (ZETIA) 10 MG tablet [id number] Yes TAKE 1 TABLET EACH DAY. Nafziger, Tommi Rumps, NP Taking Active   latanoprost (XALATAN) 0.005 % ophthalmic solution [id number] Yes Place 1 drop into both eyes at bedtime.  [provider] Taking Active Self  loratadine (CLARITIN) 10 MG tablet [id number] Yes Take 10 mg by mouth daily. [provider] Taking Active   mirtazapine (REMERON) 30 MG tablet [id number]  Take 1 tablet (30 mg total) by mouth at bedtime. Nafziger, Tommi Rumps, NP  Expired 07/13/20 2359   montelukast (SINGULAIR) 10 MG tablet [id number] Yes Take 1 tablet (10 mg total) by mouth at bedtime. Dorothyann Peng, NP  Active  omeprazole (PRILOSEC) 20 MG capsule [id number] Yes TAKE (1) CAPSULE DAILY. Nafziger, Tommi Rumps, NP Taking Active   simvastatin (ZOCOR) 10 MG tablet [id number]  Take 1 tablet (10 mg total) by mouth every other day. Dorothyann Peng, NP  Active   Med List Note Cottie Banda, [place] 11/27/16 1744): [facility] [place]          Patient Active Problem List   Diagnosis Date Noted  . 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  . Essential hypertension, benign 12/07/2014  . Muscle cramps 03/14/2014  . Allergic rhinitis, seasonal 04/06/2012  . PERSONAL HX COLONIC POLYPS 08/06/2010  . GERD 11/05/2009  . RESTLESS LEG SYNDROME 10/04/2009  . Hyperlipidemia 08/23/2009  . ANXIETY DISORDER, GENERALIZED 08/23/2009  . TREMOR, ESSENTIAL 08/23/2009  . 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 Influenza Split 09/03/2011  . Influenza Whole 08/01/2010  . Influenza, High Dose Seasonal PF 06/14/2013, 06/13/2015, 05/08/2016, 05/20/2017, 06/07/2018, 05/25/2020  . Influenza, Seasonal, Injecte, Preservative Fre 09/06/2012  . Influenza,inj,Quad PF,6+ Mos 05/29/2014  . Moderna SARS-COV2 Booster Vaccination 04/14/2020  .  PFIZER(Purple Top)SARS-COV-2 Vaccination 09/14/2019, 10/05/2019  . Pneumococcal Conjugate-13 09/08/2013  . Pneumococcal Polysaccharide-23 07/25/2009  . Zoster 09/06/2008    Conditions to be addressed/monitored:  Hypertension, Hyperlipidemia, GERD, Depression, Allergic Rhinitis and ocular migraine, glaucoma, and vitamin D deficiency  Care Plan : CCM Pharmacy Care Plan  Updates made by Viona Gilmore, [place] since 10/23/2020 12:00 AM    Problem: Problem: Hypertension, Hyperlipidemia, GERD, Depression, Allergic Rhinitis and ocular migraine, glaucoma, and vitamin D deficiency     Long-Range Goal: Patient-Specific Goal   Start Date: 10/15/2020  Expected End Date: 10/15/2021  This Visit's Progress: On track  Priority: High  Note:   Current Barriers:  . Unable to achieve control of cholesterol  . Unable to self administer medications as prescribed  Pharmacist Clinical Goal(s):  Marland Kitchen Over the next 120 days, patient will achieve adherence to monitoring guidelines and medication adherence to achieve therapeutic efficacy . achieve control of cholesterol as evidenced by next lipid panel  through collaboration with PharmD and provider.   Interventions: . 1:1 collaboration with Dorothyann Peng, NP regarding development and update of comprehensive plan of care as evidenced by provider attestation and co-signature . Inter-disciplinary care team collaboration (see longitudinal plan of care) . Comprehensive medication review performed; medication list updated in electronic medical record  Hypertension (BP goal <140/90) -Controlled -Current treatment: . Atenolol 25 mg 1/2 tablet daily -Medications previously tried: none -Current home readings: could not provide -Current dietary habits: has cut back on salt a lot and does not eat canned foods; he does admit to looking at package labels for sodium content -Current exercise habits: walks 3-4 miles a day at two different times of the day -Denies  hypotensive/hypertensive symptoms -Educated on Importance of home blood pressure monitoring; -Counseled to monitor BP at home weekly, document, and provide log at future appointments -Recommended to continue current medication  Hyperlipidemia: (LDL goal < 70) -Uncontrolled -Current treatment: . Simvastatin 20 mg every other day (taking 1/2 tablet every other day) . Ezetimibe 10 mg 1 tablet daily -Medications previously tried:  statins (muscle cramps) -Current dietary patterns: patient eats more chicken than red meat and does not drink alcohol -Current exercise habits:  walks 3-4 miles a day at two different times of the day -Educated on Cholesterol goals;  Benefits of statin for ASCVD risk reduction; -Recommended to continue current medication Educated on proper administration of simvastatin according to  directions  Depression/Insomnia (Goal: minimize symptoms of depression and improve quality and quantity of sleep) -Controlled -Current treatment: . Mirtazapine 30 mg 1 tablet at bedtime -Medications previously tried/failed: n/a -PHQ9: 10 -Educated on Benefits of medication for symptom control -Recommended to continue current medication  Ocular migraine (Goal: minimize symptoms) -Controlled -Current treatment  . Amitriptyline 25 mg 1 tablet at bedtime -Medications previously tried: none  -Recommended to continue current medication  GERD/dysphagia (Goal: minimize symptoms of heartburn or acid reflux) -Controlled -Current treatment  . Omeprazole 20 mg 1 capsule daily -Medications previously tried: none  -Recommended to continue current medication  Vitamin D deficiency (Goal: vitamin D 30-100) -Uncontrolled -Current treatment  . Vitamin D 5000 units 1 capsule daily -Medications previously tried: none  -Recommended to continue current medication Counseled on drinking plenty of water  Allergic rhinitis (Goal: minimize symptoms of allergies) -Controlled -Current treatment    Atrovent nasal spray PRN  Claritin 10 mg 1 tablet daily     Montelukast 10 mg 1 tablet daily -Medications previously tried: none  -Recommended to continue current medication  Glaucoma (Goal: lower intraocular pressure) -Controlled -Current treatment  . Latanoprost 0.005% 1 drop in both eyes at bedtime -Medications previously tried: none  -Recommended to continue current medication  Health Maintenance -Vaccine gaps: Shingrix and tetanus -Current therapy:  . Benefiber daily -Educated on Cost vs benefit of each product must be carefully weighed by individual consumer -Patient is satisfied with current therapy and denies issues -Recommended to continue current medication  Patient Goals/Self-Care Activities . Over the next 120 days, patient will:  - take medications as prescribed check blood pressure weekly, document, and provide at future appointments  Follow Up Plan: Telephone follow up appointment with care management team member scheduled for: 4 months       Medication Assistance: None required.  Patient affirms current coverage meets needs.  Patient's preferred pharmacy is:  [facility], [place] [address] Phone: [phone] Fax: [phone]  Uses pill box? Yes - daughter manages Adherence is unclear  We discussed: Current pharmacy is preferred with insurance plan and patient is satisfied with pharmacy services Patient decided to: Continue current medication management strategy  Care Plan and Follow Up Patient Decision:  Patient agrees to Care Plan and Follow-up.  Plan: Telephone follow up appointment with care management team member scheduled for:  4 months  [name], PharmD [place] Pharmacist [place] [facility] [place] [phone]

## 2020-10-23 NOTE — Patient Instructions (Addendum)
Hi Jason Farmer,  It was great talking with you again! As we discussed, please make sure you are taking 1 full tablet of the simvastatin 20 mg every other night. The every other night administration is to try to minimize the leg cramps. I also attached some other information to see if this helps with them at all.  Please give me a call if you have any questions or need anything before our follow up!  Best, Maddie  [name], PharmD [facility] Clinical Pharmacist [place] [facility] [place]   Visit Information  Goals Addressed   None    Patient Care Plan: CCM Pharmacy Care Plan    Problem Identified: Problem: Hypertension, Hyperlipidemia, GERD, Depression, Allergic Rhinitis and ocular migraine, glaucoma, and vitamin D deficiency     Long-Range Goal: Patient-Specific Goal   Start Date: 10/15/2020  Expected End Date: 10/15/2021  This Visit's Progress: On track  Priority: High  Note:   Current Barriers:  . Unable to achieve control of cholesterol  . Unable to self administer medications as prescribed  Pharmacist Clinical Goal(s):  Marland Kitchen Over the next 120 days, patient will achieve adherence to monitoring guidelines and medication adherence to achieve therapeutic efficacy . achieve control of cholesterol as evidenced by next lipid panel  through collaboration with PharmD and provider.   Interventions: . 1:1 collaboration with Dorothyann Peng, NP regarding development and update of comprehensive plan of care as evidenced by provider attestation and co-signature . Inter-disciplinary care team collaboration (see longitudinal plan of care) . Comprehensive medication review performed; medication list updated in electronic medical record  Hypertension (BP goal <140/90) -Controlled -Current treatment: . Atenolol 25 mg 1/2 tablet daily -Medications previously tried: none -Current home readings: could not provide -Current dietary habits: has cut back on salt a lot and does  not eat canned foods; he does admit to looking at package labels for sodium content -Current exercise habits: walks 3-4 miles a day at two different times of the day -Denies hypotensive/hypertensive symptoms -Educated on Importance of home blood pressure monitoring; -Counseled to monitor BP at home weekly, document, and provide log at future appointments -Recommended to continue current medication  Hyperlipidemia: (LDL goal < 70) -Uncontrolled -Current treatment: . Simvastatin 20 mg every other day (taking 1/2 tablet every other day) . Ezetimibe 10 mg 1 tablet daily -Medications previously tried:  statins (muscle cramps) -Current dietary patterns: patient eats more chicken than red meat and does not drink alcohol -Current exercise habits:  walks 3-4 miles a day at two different times of the day -Educated on Cholesterol goals;  Benefits of statin for ASCVD risk reduction; -Recommended to continue current medication Educated on proper administration of simvastatin according to directions  Depression/Insomnia (Goal: minimize symptoms of depression and improve quality and quantity of sleep) -Controlled -Current treatment: . Mirtazapine 30 mg 1 tablet at bedtime -Medications previously tried/failed: n/a -PHQ9: 10 -Educated on Benefits of medication for symptom control -Recommended to continue current medication  Ocular migraine (Goal: minimize symptoms) -Controlled -Current treatment  . Amitriptyline 25 mg 1 tablet at bedtime -Medications previously tried: none  -Recommended to continue current medication  GERD/dysphagia (Goal: minimize symptoms of heartburn or acid reflux) -Controlled -Current treatment  . Omeprazole 20 mg 1 capsule daily -Medications previously tried: none  -Recommended to continue current medication  Vitamin D deficiency (Goal: vitamin D 30-100) -Uncontrolled -Current treatment  . Vitamin D 5000 units 1 capsule daily -Medications previously tried: none   -Recommended to continue current medication Counseled on drinking  plenty of water  Allergic rhinitis (Goal: minimize symptoms of allergies) -Controlled -Current treatment   Atrovent nasal spray PRN  Claritin 10 mg 1 tablet daily     Montelukast 10 mg 1 tablet daily -Medications previously tried: none  -Recommended to continue current medication  Glaucoma (Goal: lower intraocular pressure) -Controlled -Current treatment  . Latanoprost 0.005% 1 drop in both eyes at bedtime -Medications previously tried: none  -Recommended to continue current medication  Health Maintenance -Vaccine gaps: Shingrix and tetanus -Current therapy:  . Benefiber daily -Educated on Cost vs benefit of each product must be carefully weighed by individual consumer -Patient is satisfied with current therapy and denies issues -Recommended to continue current medication  Patient Goals/Self-Care Activities . Over the next 120 days, patient will:  - take medications as prescribed check blood pressure weekly, document, and provide at future appointments  Follow Up Plan: Telephone follow up appointment with care management team member scheduled for: 4 months       Patient verbalizes understanding of instructions provided today and agrees to view in [place].  Telephone follow up appointment with pharmacy team member scheduled for: 4 months  Viona Gilmore, [facility]  Leg Cramps Leg cramps occur when one or more muscles tighten and a person has no control over it (involuntary muscle contraction). Muscle cramps are most common in the calf muscles of the leg. They can occur during exercise or at rest. Leg cramps are painful, and they may last for a few seconds to a few minutes. Cramps may return several times before they finally stop. Usually, leg cramps are not caused by a serious medical problem. In many cases, the cause is not known. Some common causes include:  Excessive physical effort (overexertion), such  as during intense exercise.  Doing the same motion over and over.  Staying in a certain position for a long period of time.  Improper preparation, form, or technique while doing a sport or an activity.  Dehydration.  Injury.  Side effects of certain medicines.  Abnormally low levels of minerals in your blood (electrolytes), especially potassium and calcium. This could result from: ? Pregnancy. ? Taking diuretic medicines. Follow these instructions at home: Eating and drinking  Drink enough fluid to keep your urine pale yellow. Staying hydrated may help prevent cramps.  Eat a healthy diet that includes plenty of nutrients to help your muscles function. A healthy diet includes fruits and vegetables, lean protein, whole grains, and low-fat or nonfat dairy products. Managing pain, stiffness, and swelling  Try massaging, stretching, and relaxing the affected muscle. Do this for several minutes at a time.  If directed, put ice on areas that are sore or painful after a cramp. To do this: ? Put ice in a plastic bag. ? Place a towel between your skin and the bag. ? Leave the ice on for 20 minutes, 2-3 times a day. ? Remove the ice if your skin turns bright red. This is very important. If you cannot feel pain, heat, or cold, you have a greater risk of damage to the area.  If directed, apply heat to muscles that are tense or tight. Do this before you exercise, or as often as told by your health care provider. Use the heat source that your health care provider recommends, such as a moist heat pack or a heating pad. To do this: ? Place a towel between your skin and the heat source. ? Leave the heat on for 20-30 minutes. ? Remove the  heat if your skin turns bright red. This is especially important if you are unable to feel pain, heat, or cold. You may have a greater risk of getting burned.  Try taking hot showers or baths to help relax tight muscles.      General instructions  If you are  having frequent leg cramps, avoid intense exercise for several days.  Take over-the-counter and prescription medicines only as told by your health care provider.  Keep all follow-up visits. This is important. Contact a health care provider if:  Your leg cramps get more severe or more frequent, or they do not improve over time.  Your foot becomes cold, numb, or blue. Summary  Muscle cramps can develop in any muscle, but the most common place is in the calf muscles of the leg.  Leg cramps are painful, and they may last for a few seconds to a few minutes.  Usually, leg cramps are not caused by a serious medical problem. Often, the cause is not known.  Stay hydrated, and take over-the-counter and prescription medicines only as told by your health care provider. This information is not intended to replace advice given to you by your health care provider. Make sure you discuss any questions you have with your health care provider. Document Revised: 12/28/2019 Document Reviewed: 12/28/2019 Elsevier Patient Education  [place].

## 2020-11-01 ENCOUNTER — Other Ambulatory Visit: Payer: Self-pay | Admitting: Adult Health

## 2020-11-01 DIAGNOSIS — I10 Essential (primary) hypertension: Secondary | ICD-10-CM

## 2020-11-01 DIAGNOSIS — R0982 Postnasal drip: Secondary | ICD-10-CM

## 2020-11-13 ENCOUNTER — Other Ambulatory Visit: Payer: Self-pay | Admitting: Adult Health

## 2020-11-13 DIAGNOSIS — F5105 Insomnia due to other mental disorder: Secondary | ICD-10-CM

## 2020-11-13 DIAGNOSIS — F322 Major depressive disorder, single episode, severe without psychotic features: Secondary | ICD-10-CM

## 2020-11-13 DIAGNOSIS — R630 Anorexia: Secondary | ICD-10-CM

## 2020-11-16 DIAGNOSIS — M25811 Other specified joint disorders, right shoulder: Secondary | ICD-10-CM | POA: Diagnosis not present

## 2020-11-16 DIAGNOSIS — M25812 Other specified joint disorders, left shoulder: Secondary | ICD-10-CM | POA: Diagnosis not present

## 2020-12-04 ENCOUNTER — Other Ambulatory Visit: Payer: Self-pay | Admitting: Adult Health

## 2020-12-04 DIAGNOSIS — I10 Essential (primary) hypertension: Secondary | ICD-10-CM

## 2020-12-04 DIAGNOSIS — R0982 Postnasal drip: Secondary | ICD-10-CM

## 2020-12-04 DIAGNOSIS — K219 Gastro-esophageal reflux disease without esophagitis: Secondary | ICD-10-CM

## 2020-12-18 ENCOUNTER — Other Ambulatory Visit: Payer: Self-pay | Admitting: Adult Health

## 2020-12-25 ENCOUNTER — Other Ambulatory Visit: Payer: Self-pay

## 2020-12-25 ENCOUNTER — Ambulatory Visit (INDEPENDENT_AMBULATORY_CARE_PROVIDER_SITE_OTHER): Payer: Medicare Other

## 2020-12-25 VITALS — BP 136/72 | HR 82 | Temp 98.2°F | Wt 137.2 lb

## 2020-12-25 DIAGNOSIS — Z0000 Encounter for general adult medical examination without abnormal findings: Secondary | ICD-10-CM | POA: Diagnosis not present

## 2020-12-25 NOTE — Progress Notes (Signed)
Subjective:   Jason Farmer is a 86 y.o. male who presents for Medicare Annual/Subsequent preventive examination.  Review of Systems     Cardiac Risk Factors include: advanced age (>22men, >36 women);dyslipidemia;male gender;hypertension     Objective:    Today's Vitals   12/25/20 1343  BP: 136/72  Pulse: 82  Temp: 98.2 F (36.8 C)  SpO2: 98%  Weight: 137 lb 3.2 oz (62.2 kg)   Body mass index is 21.98 kg/m.  Advanced Directives 12/25/2020 08/04/2018 12/23/2017 11/27/2016 12/18/2015 04/24/2015 10/06/2013  Does Patient Have a Medical Advance Directive? Yes No Yes Yes Yes Yes Patient has advance directive, copy not in chart  Type of Advance Directive Living will - - Living will [place];Living will Living will Living will  Does patient want to make changes to medical advance directive? - - - No - Patient declined No - Patient declined No - Patient declined -  Copy of [place] in Chart? - - - - No - copy requested No - copy requested -  Would patient like information on creating a medical advance directive? - No - Patient declined - - - - -    Current Medications (verified) Outpatient Encounter Medications as of 12/25/2020  Medication Sig  . amitriptyline (ELAVIL) 25 MG tablet TAKE ONE TABLET AT BEDTIME.  Marland Kitchen atenolol (TENORMIN) 25 MG tablet TAKE (1/2) TABLET DAILY.  Marland Kitchen Cholecalciferol (VITAMIN D3) 125 MCG (5000 UT) CAPS Take 1 capsule by mouth daily.   Marland Kitchen ezetimibe (ZETIA) 10 MG tablet TAKE 1 TABLET EACH DAY.  Marland Kitchen ipratropium (ATROVENT) 0.03 % nasal spray Place 2 sprays into both nostrils every 12 (twelve) hours.  Marland Kitchen latanoprost (XALATAN) 0.005 % ophthalmic solution Place 1 drop into both eyes at bedtime.   Marland Kitchen loratadine (CLARITIN) 10 MG tablet Take 10 mg by mouth daily.  . mirtazapine (REMERON) 30 MG tablet TAKE ONE TABLET AT BEDTIME.  . montelukast (SINGULAIR) 10 MG tablet TAKE ONE TABLET BY MOUTH AT BEDTIME.  Marland Kitchen omeprazole (PRILOSEC) 20 MG capsule  TAKE (1) CAPSULE DAILY.  . simvastatin (ZOCOR) 20 MG tablet Take 1 tablet (20 mg total) by mouth every other day.   No facility-administered encounter medications on file as of 12/25/2020.    Allergies (verified) Statins and Aspirin   History: Past Medical History:  Diagnosis Date  . Anxiety    "not a problem for a long time" (11/27/2016)  . Arthritis    "wrists, knees, shoulders, back" (11/27/2016)  . Chronic lower back pain    "5th disc is protruding" (11/27/2016)  . Essential tremor   . GERD (gastroesophageal reflux disease)   . Glaucoma   . Heart murmur    dx'd in [employer] in the 1960s; haven't been seen for it since "(11/27/2016)  . Hepatitis ~ 1958   "jaundice kind"  . HOH (hard of hearing)   . Hyperlipemia   . Migraine    "none since my neck OR" (11/27/2016)  . Seasonal allergies   . TIA (transient ischemic attack) 04/2015  . Wears glasses   . Wears hearing aid    Past Surgical History:  Procedure Laterality Date  . ANTERIOR CERVICAL DECOMP/DISCECTOMY FUSION  2007  . APPENDECTOMY    . BACK SURGERY    . CARPAL TUNNEL RELEASE Right 10/12/2013   Procedure: RIGHT CARPAL TUNNEL RELEASE;  Surgeon: Wynonia Sours, MD;  Location: [facility];  Service: Orthopedics;  Laterality: Right;  . CATARACT EXTRACTION W/ INTRAOCULAR LENS  IMPLANT, BILATERAL Bilateral   . COLONOSCOPY    . HEMORRHOID SURGERY    . KNEE ARTHROSCOPY Left 1970s  . NASAL SEPTOPLASTY W/ TURBINOPLASTY Bilateral 02/15/2013   Procedure: NASAL SEPTOPLASTY WITH BILATER TURBINATE REDUCTION;  Surgeon: Ascencion Dike, MD;  Location: [facility];  Service: ENT;  Laterality: Bilateral;  . TONSILLECTOMY    . TRIGGER FINGER RELEASE  06/08/2012   Procedure: RELEASE TRIGGER FINGER/A-1 PULLEY;  Surgeon: Wynonia Sours, MD;  Location: [facility];  Service: Orthopedics;  Laterality: Left;  Release A-1 Pulley Left Long Finger  . TRIGGER FINGER RELEASE  07/14/2012   Procedure: RELEASE TRIGGER FINGER/A-1  PULLEY;  Surgeon: Wynonia Sours, MD;  Location: [facility];  Service: Orthopedics;  Laterality: Right;  . TRIGGER FINGER RELEASE Right 10/12/2013   Procedure: RELEASE TRIGGER FINGER/A-1 PULLEY RIGHT MIDDLE FINGER;  Surgeon: Wynonia Sours, MD;  Location: [facility];  Service: Orthopedics;  Laterality: Right;  . UPPER GASTROINTESTINAL ENDOSCOPY     Family History  Problem Relation Age of Onset  . Dementia Father   . Heart failure Father   . Glaucoma Mother   . Colon cancer Neg Hx    Social History   Socioeconomic History  . Marital status: Married    Spouse name: Not on file  . Number of children: 4  . Years of education: Not on file  . Highest education level: Not on file  Occupational History  . Not on file  Tobacco Use  . Smoking status: Former Smoker    Packs/day: 2.00    Years: 30.00    Pack years: 60.00    Types: Cigarettes    Quit date: 06/03/1984    Years since quitting: 36.5  . Smokeless tobacco: Never Used  . Tobacco comment: had AAA at the [facility]; also noted 2018 on Dr. Kyla Balzarine note   Substance and Sexual Activity  . Alcohol use: No    Comment: 11/27/2016 "quit in 1985"  . Drug use: No  . Sexual activity: Not Currently  Other Topics Concern  . Not on file  Social History Narrative   Retired from [employer]   Married    4 children, one son lives locally   Social Determinants of Health   Financial Resource Strain: [place]   . Difficulty of Paying Living Expenses: Not hard at all  Food Insecurity: No Food Insecurity  . Worried About Charity fundraiser in the Last Year: Never true  . Ran Out of Food in the Last Year: Never true  Transportation Needs: No Transportation Needs  . Lack of Transportation (Medical): No  . Lack of Transportation (Non-Medical): No  Physical Activity: Sufficiently Active  . Days of Exercise per Week: 5 days  . Minutes of Exercise per Session: 60 min  Stress: Stress Concern Present  . Feeling of Stress :  To some extent  Social Connections: Moderately Isolated  . Frequency of Communication with Friends and Family: Twice a week  . Frequency of Social Gatherings with Friends and Family: Once a week  . Attends Religious Services: Never  . Active Member of Clubs or Organizations: No  . Attends Archivist Meetings: Never  . Marital Status: Married    Tobacco Counseling Counseling given: Not Answered Comment: had AAA at the [place]; also noted 2018 on Dr. Kyla Balzarine note    Clinical Intake:  Pre-visit preparation completed: Yes  Pain : No/denies pain     BMI -  recorded: 21.98 Nutritional Status: BMI of 19-24  Normal Nutritional Risks: None Diabetes: No  How often do you need to have someone help you when you read instructions, pamphlets, or other written materials from your doctor or pharmacy?: 1 - Never  Diabetic?No  Interpreter Needed?: No  Information entered by :: Charlott Rakes, LPN   Activities of Daily Living In your present state of health, do you have any difficulty performing the following activities: 12/25/2020  Hearing? Y  Comment wears hearing  Vision? N  Difficulty concentrating or making decisions? Y  Comment memory at times  Walking or climbing stairs? N  Dressing or bathing? N  Doing errands, shopping? N  Preparing Food and eating ? N  Using the Toilet? N  In the past six months, have you accidently leaked urine? N  Do you have problems with loss of bowel control? N  Managing your Medications? N  Managing your Finances? N  Housekeeping or managing your Housekeeping? N  Some recent data might be hidden    Patient Care Team: Dorothyann Peng, NP as PCP - General (Family Medicine) Rozetta Nunnery, MD as Consulting Physician (Otolaryngology) [facility] as Consulting Physician (Orthopedic Surgery) Viona Gilmore, [facility] as Pharmacist (Pharmacist)  Indicate any recent Medical Services you may have received from other than [facility] providers in the  past year (date may be approximate).     Assessment:   This is a routine wellness examination for Advaith.  Hearing/Vision screen  Hearing Screening   125Hz  250Hz  500Hz  1000Hz  2000Hz  3000Hz  4000Hz  6000Hz  8000Hz   Right ear:           Left ear:           Comments: Pt wears hearing aids   Vision Screening Comments: Pt follows up with Dr Delman Cheadle for annual eye exams   Dietary issues and exercise activities discussed: Current Exercise Habits: Home exercise routine, Type of exercise: walking, Time (Minutes): 60, Frequency (Times/Week): 5, Weekly Exercise (Minutes/Week): 300  Goals Addressed            This Visit's Progress   . Patient Stated       None at this time      Depression Screen PHQ 2/9 Scores 12/25/2020 09/26/2020 08/03/2020 06/19/2020 03/16/2020 08/30/2019 02/09/2018  PHQ - 2 Score 1 0 0 4 5 0 0  PHQ- 9 Score - - - 10 16 - -    Fall Risk Fall Risk  12/25/2020 09/26/2020 08/03/2020 08/30/2019 03/24/2019  Falls in the past year? 1 0 0 0 (No Data)  Comment - - - - Emmi Telephone Survey: data to providers prior to load  Number falls in past yr: 1 - - 0 (No Data)  Comment - - - - Emmi Telephone Survey Actual Response =   Injury with Fall? 1 - - 0 -  Comment bruised leg - - - -  Risk for fall due to : Impaired balance/gait;Impaired mobility;History of fall(s);Impaired vision - - - -  Risk for fall due to: Comment - - - - -  Follow up Falls prevention discussed - - - -    FALL RISK PREVENTION PERTAINING TO THE HOME:  Any stairs in or around the home? Yes  If so, are there any without handrails? No  Home free of loose throw rugs in walkways, pet beds, electrical cords, etc? Yes  Adequate lighting in your home to reduce risk of falls? Yes   ASSISTIVE DEVICES UTILIZED TO PREVENT FALLS:  Life alert? No  Use of a cane, walker or w/c? No  Grab bars in the bathroom? Yes  Shower chair or bench in shower? Yes  Elevated toilet seat or a handicapped toilet? Yes   TIMED UP AND  GO:  Was the test performed? Yes .  Length of time to ambulate 10 feet: 10 sec.   Gait steady and fast without use of assistive device  Cognitive Function: MMSE - Mini Mental State Exam 12/23/2017  Not completed: (No Data)     6CIT Screen 12/25/2020  What Year? 4 points  What month? 0 points  Count back from 20 0 points  Months in reverse 0 points  Repeat phrase 2 points    Immunizations Immunization History  Administered Date(s) Administered  . Influenza Split 09/03/2011  . Influenza Whole 08/01/2010  . Influenza, High Dose Seasonal PF 06/14/2013, 06/13/2015, 05/08/2016, 05/20/2017, 06/01/2017, 06/07/2018, 06/25/2018, 05/25/2020  . Influenza, Seasonal, Injecte, Preservative Fre 09/06/2012  . Influenza,inj,Quad PF,6+ Mos 05/29/2014  . Influenza-Unspecified 05/25/2005, 09/09/2006, 06/02/2007, 05/31/2008, 08/29/2008, 09/08/2009, 07/25/2010, 09/03/2011, 08/25/2012, 06/25/2013, 06/25/2014, 06/13/2015, 04/25/2016  . Moderna SARS-COV2 Booster Vaccination 04/14/2020  . PFIZER(Purple Top)SARS-COV-2 Vaccination 09/14/2019, 10/05/2019  . Pneumococcal Conjugate-13 08/25/2013, 09/08/2013, 05/25/2017  . Pneumococcal Polysaccharide-23 08/25/2005, 07/25/2009, 06/25/2018  . Tdap 10/01/2011, 08/11/2018  . Zoster 09/06/2008    TDAP status: Up to date  Flu Vaccine status: Up to date  Pneumococcal vaccine status: Up to date  Covid-19 vaccine status: Completed vaccines  Qualifies for Shingles Vaccine? Yes   Zostavax completed Yes   Shingrix Completed?: No.    Education has been provided regarding the importance of this vaccine. Patient has been advised to call insurance company to determine out of pocket expense if they have not yet received this vaccine. Advised may also receive vaccine at local pharmacy or Health Dept. Verbalized acceptance and understanding.  Screening Tests Health Maintenance  Topic Date Due  . INFLUENZA VACCINE  03/25/2021  . TETANUS/TDAP  08/11/2028  . COVID-19  Vaccine  Completed  . PNA vac Low Risk Adult  Completed  . HPV VACCINES  Aged Out    Health Maintenance  There are no preventive care reminders to display for this patient.  Colorectal cancer screening: No longer required.    Additional Screening:   Vision Screening: Recommended annual ophthalmology exams for early detection of glaucoma and other disorders of the eye. Is the patient up to date with their annual eye exam?  Yes  Who is the provider or what is the name of the office in which the patient attends annual eye exams? Dr Emily Filbert  If pt is not established with a provider, would they like to be referred to a provider to establish care? No .   Dental Screening: Recommended annual dental exams for proper oral hygiene  Community Resource Referral / Chronic Care Management: CRR required this visit?  No   CCM required this visit?  No      Plan:     I have personally reviewed and noted the following in the patient's chart:   . Medical and social history . Use of alcohol, tobacco or illicit drugs  . Current medications and supplements including opioid prescriptions. Patient is not currently taking opioid prescriptions. . Functional ability and status . Nutritional status . Physical activity . Advanced directives . List of other physicians . Hospitalizations, surgeries, and ER visits in previous 12 months . Vitals . Screenings to include cognitive, depression, and falls . Referrals and appointments  In addition, I have reviewed and  discussed with patient certain preventive protocols, quality metrics, and best practice recommendations. A written personalized care plan for preventive services as well as general preventive health recommendations were provided to patient.     Marzella Schlein, LPN   04/30/4382   Nurse Notes: Pt stating that he has been constipated for months at a time the fiber isn't keeping him regular, he requesting a call to discuss.

## 2020-12-25 NOTE — Patient Instructions (Addendum)
[name] , Thank you for taking time to come for your Medicare Wellness Visit. I appreciate your ongoing commitment to your health goals. Please review the following plan we discussed and let me know if I can assist you in the future.   Screening recommendations/referrals: Colonoscopy: Done 01/01/16 Recommended yearly ophthalmology/optometry visit for glaucoma screening and checkup Recommended yearly dental visit for hygiene and checkup  Vaccinations: Influenza vaccine: Up to date Pneumococcal vaccine: Up to date Tdap vaccine: Up to date Shingles vaccine: Shingrix discussed. Please contact your pharmacy for coverage information.    Covid-19: Completed 1/20, 2/10, & 04/14/20  Advanced directives: Please bring a copy of your health care power of attorney and living will to the office at your convenience.  Conditions/risks identified: None at this time   Next appointment: Follow up in one year for your annual wellness visit.   Preventive Care 15 Years and Older, Male Preventive care refers to lifestyle choices and visits with your health care provider that can promote health and wellness. What does preventive care include?  A yearly physical exam. This is also called an annual well check.  Dental exams once or twice a year.  Routine eye exams. Ask your health care provider how often you should have your eyes checked.  Personal lifestyle choices, including:  Daily care of your teeth and gums.  Regular physical activity.  Eating a healthy diet.  Avoiding tobacco and drug use.  Limiting alcohol use.  Practicing safe sex.  Taking low doses of aspirin every day.  Taking vitamin and mineral supplements as recommended by your health care provider. What happens during an annual well check? The services and screenings done by your health care provider during your annual well check will depend on your age, overall health, lifestyle risk factors, and family history of  disease. Counseling  Your health care provider may ask you questions about your:  Alcohol use.  Tobacco use.  Drug use.  Emotional well-being.  Home and relationship well-being.  Sexual activity.  Eating habits.  History of falls.  Memory and ability to understand (cognition).  Work and work Statistician. Screening  You may have the following tests or measurements:  Height, weight, and BMI.  Blood pressure.  Lipid and cholesterol levels. These may be checked every 5 years, or more frequently if you are over 97 years old.  Skin check.  Lung cancer screening. You may have this screening every year starting at age 44 if you have a 30-pack-year history of smoking and currently smoke or have quit within the past 15 years.  Fecal occult blood test (FOBT) of the stool. You may have this test every year starting at age 81.  Flexible sigmoidoscopy or colonoscopy. You may have a sigmoidoscopy every 5 years or a colonoscopy every 10 years starting at age 41.  Prostate cancer screening. Recommendations will vary depending on your family history and other risks.  Hepatitis C blood test.  Hepatitis B blood test.  Sexually transmitted disease (STD) testing.  Diabetes screening. This is done by checking your blood sugar (glucose) after you have not eaten for a while (fasting). You may have this done every 1-3 years.  Abdominal aortic aneurysm (AAA) screening. You may need this if you are a current or former smoker.  Osteoporosis. You may be screened starting at age 72 if you are at high risk. Talk with your health care provider about your test results, treatment options, and if necessary, the need for more tests. Vaccines  Your health care provider may recommend certain vaccines, such as:  Influenza vaccine. This is recommended every year.  Tetanus, diphtheria, and acellular pertussis (Tdap, Td) vaccine. You may need a Td booster every 10 years.  Zoster vaccine. You may  need this after age 54.  Pneumococcal 13-valent conjugate (PCV13) vaccine. One dose is recommended after age 45.  Pneumococcal polysaccharide (PPSV23) vaccine. One dose is recommended after age 101. Talk to your health care provider about which screenings and vaccines you need and how often you need them. This information is not intended to replace advice given to you by your health care provider. Make sure you discuss any questions you have with your health care provider. Document Released: 09/07/2015 Document Revised: 04/30/2016 Document Reviewed: 06/12/2015 Elsevier Interactive Patient Education  2017 [place] Prevention in the Home Falls can cause injuries. They can happen to people of all ages. There are many things you can do to make your home safe and to help prevent falls. What can I do on the outside of my home?  Regularly fix the edges of walkways and driveways and fix any cracks.  Remove anything that might make you trip as you walk through a door, such as a raised step or threshold.  Trim any bushes or trees on the path to your home.  Use bright outdoor lighting.  Clear any walking paths of anything that might make someone trip, such as rocks or tools.  Regularly check to see if handrails are loose or broken. Make sure that both sides of any steps have handrails.  Any raised decks and porches should have guardrails on the edges.  Have any leaves, snow, or ice cleared regularly.  Use sand or salt on walking paths during winter.  Clean up any spills in your garage right away. This includes oil or grease spills. What can I do in the bathroom?  Use night lights.  Install grab bars by the toilet and in the tub and shower. Do not use towel bars as grab bars.  Use non-skid mats or decals in the tub or shower.  If you need to sit down in the shower, use a plastic, non-slip stool.  Keep the floor dry. Clean up any water that spills on the floor as soon as it  happens.  Remove soap buildup in the tub or shower regularly.  Attach bath mats securely with double-sided non-slip rug tape.  Do not have throw rugs and other things on the floor that can make you trip. What can I do in the bedroom?  Use night lights.  Make sure that you have a light by your bed that is easy to reach.  Do not use any sheets or blankets that are too big for your bed. They should not hang down onto the floor.  Have a firm chair that has side arms. You can use this for support while you get dressed.  Do not have throw rugs and other things on the floor that can make you trip. What can I do in the kitchen?  Clean up any spills right away.  Avoid walking on wet floors.  Keep items that you use a lot in easy-to-reach places.  If you need to reach something above you, use a strong step stool that has a grab bar.  Keep electrical cords out of the way.  Do not use floor polish or wax that makes floors slippery. If you must use wax, use non-skid floor wax.  Do  not have throw rugs and other things on the floor that can make you trip. What can I do with my stairs?  Do not leave any items on the stairs.  Make sure that there are handrails on both sides of the stairs and use them. Fix handrails that are broken or loose. Make sure that handrails are as long as the stairways.  Check any carpeting to make sure that it is firmly attached to the stairs. Fix any carpet that is loose or worn.  Avoid having throw rugs at the top or bottom of the stairs. If you do have throw rugs, attach them to the floor with carpet tape.  Make sure that you have a light switch at the top of the stairs and the bottom of the stairs. If you do not have them, ask someone to add them for you. What else can I do to help prevent falls?  Wear shoes that:  Do not have high heels.  Have rubber bottoms.  Are comfortable and fit you well.  Are closed at the toe. Do not wear sandals.  If you  use a stepladder:  Make sure that it is fully opened. Do not climb a closed stepladder.  Make sure that both sides of the stepladder are locked into place.  Ask someone to hold it for you, if possible.  Clearly mark and make sure that you can see:  Any grab bars or handrails.  First and last steps.  Where the edge of each step is.  Use tools that help you move around (mobility aids) if they are needed. These include:  Canes.  Walkers.  Scooters.  Crutches.  Turn on the lights when you go into a dark area. Replace any light bulbs as soon as they burn out.  Set up your furniture so you have a clear path. Avoid moving your furniture around.  If any of your floors are uneven, fix them.  If there are any pets around you, be aware of where they are.  Review your medicines with your doctor. Some medicines can make you feel dizzy. This can increase your chance of falling. Ask your doctor what other things that you can do to help prevent falls. This information is not intended to replace advice given to you by your health care provider. Make sure you discuss any questions you have with your health care provider. Document Released: 06/07/2009 Document Revised: 01/17/2016 Document Reviewed: 09/15/2014 Elsevier Interactive Patient Education  2017 [employer].

## 2021-01-17 ENCOUNTER — Telehealth: Payer: Self-pay | Admitting: Pharmacist

## 2021-01-17 NOTE — Chronic Care Management (AMB) (Signed)
=      Chronic Care Management Pharmacy Assistant   Name: Jason Farmer  MRN: [id number] DOB: February 20, 1935  Reason for Encounter: General Adherence Call       Recent office visits:  . 05.03.2022 [name], LPN Medicare Wellness Exam   Recent consult visits:  . 03.25.2022 [name] Orthopaedic Surgery patient seen for follow up for right rotator cuff  Hospital visits:  None in previous 6 months  Medications: Outpatient Encounter Medications as of 01/17/2021  Medication Sig  . amitriptyline (ELAVIL) 25 MG tablet TAKE ONE TABLET AT BEDTIME.  Marland Kitchen atenolol (TENORMIN) 25 MG tablet TAKE (1/2) TABLET DAILY.  Marland Kitchen Cholecalciferol (VITAMIN D3) 125 MCG (5000 UT) CAPS Take 1 capsule by mouth daily.   Marland Kitchen ezetimibe (ZETIA) 10 MG tablet TAKE 1 TABLET EACH DAY.  Marland Kitchen ipratropium (ATROVENT) 0.03 % nasal spray Place 2 sprays into both nostrils every 12 (twelve) hours.  Marland Kitchen latanoprost (XALATAN) 0.005 % ophthalmic solution Place 1 drop into both eyes at bedtime.   Marland Kitchen loratadine (CLARITIN) 10 MG tablet Take 10 mg by mouth daily.  . mirtazapine (REMERON) 30 MG tablet TAKE ONE TABLET AT BEDTIME.  . montelukast (SINGULAIR) 10 MG tablet TAKE ONE TABLET BY MOUTH AT BEDTIME.  Marland Kitchen omeprazole (PRILOSEC) 20 MG capsule TAKE (1) CAPSULE DAILY.  . simvastatin (ZOCOR) 20 MG tablet Take 1 tablet (20 mg total) by mouth every other day.   No facility-administered encounter medications on file as of 01/17/2021.   I spoke with the patient about medication inherence. He states that he has been doing well. There have been no recent changes in his medications. He continues to take his medication as prescribed. He states there have been no recent falls and injuries. He states that he continues his same diet and exercises. There has been no emergency department or urgent care visit since his last CPP or PCP visit. The patient is scheduled for a follow-up CCM appointment in June. There are no current issues with this  pharmacy.  Star Rating Drugs: Medication Dispensed Quantity Pharmacy  Simvastatin 20 mg 05.11.2022 [place] [facility] Mare Ferrari, [place] Pharmacist Assistant [phone]

## 2021-02-12 ENCOUNTER — Telehealth: Payer: Self-pay | Admitting: Pharmacist

## 2021-02-12 NOTE — Chronic Care Management (AMB) (Signed)
    Chronic Care Management Pharmacy Assistant   Name: Jason Farmer  MRN: [id number] DOB: 1934/10/23  02/12/21- Called patient to remind of appointment with [name]) on (02/13/21 at 1pm by phone)   No answer, left message of appointment date, time and type of appointment (either telephone or in person). Left message to have all medications, supplements, blood pressure and/or blood sugar logs available during appointment and to return call if need to reschedule.   Star Rating Drug:  Simvastatin 20mg  - last filled on 01/02/21 90DS at [facility].  Any gaps in medications fill history? No.  [place]  Clinical Pharmacist Assistant [phone]

## 2021-02-13 ENCOUNTER — Ambulatory Visit (INDEPENDENT_AMBULATORY_CARE_PROVIDER_SITE_OTHER): Payer: Medicare Other | Admitting: Pharmacist

## 2021-02-13 DIAGNOSIS — I10 Essential (primary) hypertension: Secondary | ICD-10-CM

## 2021-02-13 DIAGNOSIS — E785 Hyperlipidemia, unspecified: Secondary | ICD-10-CM

## 2021-02-13 NOTE — Progress Notes (Signed)
Chronic Care Management Pharmacy Note  02/22/2021 Name:  Jason Farmer MRN:  [id number] DOB:  April 15, 1935  Summary: Patient is unsure of what medications he is taking as his wife manages them BP is at goal < 140/90 per office readings Patient is still having issues with swallowing  Recommendations/Changes made from today's visit: -Recommended referral to ENT for swallowing concerns -Recommended routine monitoring of BP at home  Plan: Follow up in 6 months  Subjective: Jason Farmer is an 86 y.o. year old male who is a primary patient of Jason Peng, NP.  The CCM team was consulted for assistance with disease management and care coordination needs.    Engaged with patient by telephone for follow up visit in response to provider referral for pharmacy case management and/or care coordination services.   Consent to Services:  The patient was given information about Chronic Care Management services, agreed to services, and gave verbal consent prior to initiation of services.  Please see initial visit note for detailed documentation.   Patient Care Team: Jason Peng, NP as PCP - General (Family Medicine) Jason Nunnery, MD as Consulting Physician (Otolaryngology) Jason Farmer as Consulting Physician (Orthopedic Surgery) Jason Farmer, [facility] as Pharmacist (Pharmacist)  Recent office visits: 12/25/20 Jason Rakes, LPN: Patient presented for AWV.  09/26/20 Jason Peng, NP: Patient presented for annual exam. Increased simvastatin to 20 mg every other night and increased vitamin D to 5000 units daily. Prescribed montelukast 10 mg at bedtime.  08/03/20 Jason Peng, NP: Patient presented for BPH and dizziness.   Recent consult visits: 08/29/20 Jason Edward, MD (gastro): Patient presented for EGD.  07/24/20 Jason Edward, MD (gastro): Patient presented for dysphagia evaluation.   04/13/20 Jason Farmer (ortho): Patient presented for shoulder follow up. Unable to access  notes.   02/07/20 Jason Farmer (ophthalmology): Patient presented for glaucoma follow up. Unable to access notes.  Hospital visits: None in previous 6 months  Objective:  Lab Results  Component Value Date   CREATININE 1.34 09/26/2020   BUN 18 09/26/2020   GFR 49.02 (L) 09/26/2020   GFRNONAA 44 (L) 03/16/2020   GFRAA 51 (L) 03/16/2020   NA 137 09/26/2020   K 4.9 09/26/2020   CALCIUM 9.8 09/26/2020   CO2 29 09/26/2020    Lab Results  Component Value Date/Time   GFR 49.02 (L) 09/26/2020 10:47 AM   GFR 50.99 (L) 08/30/2019 08:26 AM    Last diabetic Eye exam: No results found for: HMDIABEYEEXA  Last diabetic Foot exam: No results found for: HMDIABFOOTEX   Lab Results  Component Value Date   CHOL 232 (H) 09/26/2020   HDL 50.30 09/26/2020   LDLCALC 148 (H) 09/26/2020   LDLDIRECT 139.5 09/01/2013   TRIG 169.0 (H) 09/26/2020   CHOLHDL 5 09/26/2020    Hepatic Function Latest Ref Rng & Units 09/26/2020 03/16/2020 08/30/2019  Total Protein 6.0 - 8.3 g/dL 6.6 6.0(L) 6.3  Albumin 3.5 - 5.2 g/dL 4.1 - 4.0  AST 0 - 37 U/L _0 ALT 0 - 53 U/L _1 Alk Phosphatase 39 - 117 U/L 59 - 51  Total Bilirubin 0.2 - 1.2 mg/dL 0.5 0.3 0.6  Bilirubin, Direct 0.0 - 0.3 mg/dL - - -    Lab Results  Component Value Date/Time   TSH 1.40 09/26/2020 10:47 AM   TSH 1.63 08/30/2019 08:26 AM    CBC Latest Ref Rng & Units 09/26/2020 03/16/2020 08/30/2019  WBC 4.0 - 10.5 K/uL 6.2 7.5  6.3  Hemoglobin 13.0 - 17.0 g/dL 14.7 14.3 14.4  Hematocrit 39.0 - 52.0 % 43.8 43.2 43.2  Platelets 150.0 - 400.0 K/uL 266.0 318 258.0    Lab Results  Component Value Date/Time   VD25OH 27.55 (L) 09/26/2020 10:47 AM   VD25OH 20.33 (L) 02/09/2018 01:33 PM    Clinical ASCVD: No  The ASCVD Risk score Mikey Bussing DC Jr., et al., 2013) failed to calculate for the following reasons:   The 2013 ASCVD risk score is only valid for ages 43 to 24    Depression screen PHQ 2/9 12/25/2020 09/26/2020 08/03/2020  Decreased  Interest 0 0 0  Down, Depressed, Hopeless 1 0 0  PHQ - 2 Score 1 0 0  Altered sleeping - - -  Tired, decreased energy - - -  Change in appetite - - -  Feeling bad or failure about yourself  - - -  Trouble concentrating - - -  Moving slowly or fidgety/restless - - -  Suicidal thoughts - - -  PHQ-9 Score - - -  Difficult doing work/chores - - -      Social History   Tobacco Use  Smoking Status Former   Packs/day: 2.00   Years: 30.00   Pack years: 60.00   Types: Cigarettes   Quit date: 06/03/1984   Years since quitting: 36.7  Smokeless Tobacco Never  Tobacco Comments   had AAA at the [place]; also noted 2018 on Dr. Kyla Balzarine note    BP Readings from Last 3 Encounters:  12/25/20 136/72  09/26/20 118/78  08/29/20 121/88   Pulse Readings from Last 3 Encounters:  12/25/20 82  08/29/20 63  08/03/20 89   Wt Readings from Last 3 Encounters:  12/25/20 137 lb 3.2 oz (62.2 kg)  09/26/20 141 lb (64 kg)  08/29/20 143 lb (64.9 kg)    Assessment/Interventions: Review of patient past medical history, allergies, medications, health status, including review of consultants reports, laboratory and other test data, was performed as part of comprehensive evaluation and provision of chronic care management services.   SDOH:  (Social Determinants of Health) assessments and interventions performed: No   CCM Care Plan  Allergies  Allergen Reactions   Statins Other (See Comments)    Muscle cramps    Aspirin Other (See Comments)    bleeding    Medications Reviewed Today     Reviewed by Jason Brace, LPN (Licensed Practical Nurse) on 12/25/20 at 1356  Med List Status: <None>   Medication Order Taking? Sig Documenting Provider Last Dose Status Informant  amitriptyline (ELAVIL) 25 MG tablet [id number] Yes TAKE ONE TABLET AT BEDTIME. Nafziger, Tommi Rumps, NP Taking Active   atenolol (TENORMIN) 25 MG tablet [id number] Yes TAKE (1/2) TABLET DAILY. Nafziger, Tommi Rumps, NP Taking Active    Cholecalciferol (VITAMIN D3) 125 MCG (5000 UT) CAPS [id number] Yes Take 1 capsule by mouth daily.  [provider] Taking Active Self  ezetimibe (ZETIA) 10 MG tablet [id number] Yes TAKE 1 TABLET EACH DAY. Nafziger, Tommi Rumps, NP Taking Active   ipratropium (ATROVENT) 0.03 % nasal spray [id number] Yes Place 2 sprays into both nostrils every 12 (twelve) hours. [provider] Taking Active   latanoprost (XALATAN) 0.005 % ophthalmic solution [id number] Yes Place 1 drop into both eyes at bedtime.  [provider] Taking Active Self  loratadine (CLARITIN) 10 MG tablet [id number] Yes Take 10 mg by mouth daily. [provider] Taking Active   mirtazapine (REMERON) 30 MG tablet [id number] Yes TAKE ONE TABLET AT  BEDTIME. Nafziger, Tommi Rumps, NP Taking Active   montelukast (SINGULAIR) 10 MG tablet [id number] Yes TAKE ONE TABLET BY MOUTH AT BEDTIME. Nafziger, Tommi Rumps, NP Taking Active   omeprazole (PRILOSEC) 20 MG capsule [id number] Yes TAKE (1) CAPSULE DAILY. Nafziger, Tommi Rumps, NP Taking Active   simvastatin (ZOCOR) 20 MG tablet [id number] Yes Take 1 tablet (20 mg total) by mouth every other day. 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    Immunization History  Administered Date(s) Administered   Influenza Split 09/03/2011   Influenza Whole 08/01/2010   Influenza, High Dose Seasonal PF 06/14/2013, 06/13/2015, 05/08/2016, 05/20/2017, 06/01/2017, 06/07/2018, 06/25/2018, 05/25/2020    Influenza, Seasonal, Injecte, Preservative Fre 09/06/2012   Influenza,inj,Quad PF,6+ Mos 05/29/2014   Influenza-Unspecified 05/25/2005, 09/09/2006, 06/02/2007, 05/31/2008, 08/29/2008, 09/08/2009, 07/25/2010, 09/03/2011, 08/25/2012, 06/25/2013, 06/25/2014, 06/13/2015, 04/25/2016   Moderna SARS-COV2 Booster Vaccination 04/14/2020   PFIZER(Purple Top)SARS-COV-2 Vaccination 09/14/2019, 10/05/2019   Pneumococcal Conjugate-13 08/25/2013, 09/08/2013, 05/25/2017   Pneumococcal Polysaccharide-23 08/25/2005, 07/25/2009, 06/25/2018   Tdap 10/01/2011, 08/11/2018   Zoster, Live 09/06/2008   Patient is taking his wife to appointments and their daughter is helping out some. His wife is still managing his medications and he is unsure of what he is taking consistently.   Conditions to be addressed/monitored:  Hypertension, Hyperlipidemia, GERD, Depression, Allergic Rhinitis and ocular migraine, glaucoma, and vitamin D deficiency  Care Plan : CCM Pharmacy Care Plan  Updates made by Jason Farmer, [place] since 02/22/2021 12:00 AM     Problem: Problem: Hypertension, Hyperlipidemia, GERD, Depression, Allergic Rhinitis and ocular migraine, glaucoma, and vitamin D deficiency      Long-Range Goal: Patient-Specific Goal   Start Date: 10/15/2020  Expected End Date: 10/15/2021  Recent Progress: On track  Priority: High  Note:   Current Barriers:  Unable to achieve control of cholesterol  Unable to self administer medications as prescribed  Pharmacist Clinical Goal(s):  Patient will achieve adherence to monitoring guidelines and medication adherence to achieve therapeutic efficacy achieve control of cholesterol as evidenced by next lipid panel  through collaboration with PharmD and provider.   Interventions: 1:1 collaboration with Jason Peng, NP regarding development and update of comprehensive plan of care as evidenced by provider attestation and co-signature Inter-disciplinary care team collaboration  (see longitudinal plan of care) Comprehensive medication review performed; medication list updated in electronic medical record  Hypertension (BP goal <140/90) -Controlled -Current treatment: Atenolol 25 mg 1/2 tablet daily -Medications previously tried: none -Current home readings: could not provide -Current dietary habits: has cut back on salt a lot and does not eat canned foods; he does admit to looking at package labels for sodium content -Current exercise habits: walks 3-4 miles a day at two different times of the day -Denies hypotensive/hypertensive symptoms -Educated on Importance of home blood pressure monitoring; -Counseled to monitor BP at home weekly, document, and provide log at future appointments -Recommended to continue current medication  Hyperlipidemia: (LDL goal < 70) -Uncontrolled -Current treatment: Simvastatin 20 mg every  other day (taking 1/2 tablet every other day) Ezetimibe 10 mg 1 tablet daily -Medications previously tried:  statins (muscle cramps) -Current dietary patterns: patient eats more chicken than red meat and does not drink alcohol -Current exercise habits:  walks 3-4 miles a day at two different times of the day -Educated on Cholesterol goals;  Benefits of statin for ASCVD risk reduction; -Recommended to continue current medication Educated on proper administration of simvastatin according to directions  Depression/Insomnia (Goal: minimize symptoms of depression and improve quality and quantity of sleep) -Controlled -Current treatment: Mirtazapine 30 mg 1 tablet at bedtime -Medications previously tried/failed: n/a -PHQ9: 10 -Educated on Benefits of medication for symptom control -Recommended to continue current medication  Ocular migraine (Goal: minimize symptoms) -Controlled -Current treatment  Amitriptyline 25 mg 1 tablet at bedtime -Medications previously tried: none  -Recommended to continue current medication  GERD/dysphagia (Goal:  minimize symptoms of heartburn or acid reflux) -Controlled -Current treatment  Omeprazole 20 mg 1 capsule daily -Medications previously tried: none  -Recommended to continue current medication Recommended ENT referral  Vitamin D deficiency (Goal: vitamin D 30-100) -Uncontrolled -Current treatment  Vitamin D 5000 units 1 capsule daily -Medications previously tried: none  -Recommended to continue current medication Counseled on drinking plenty of water  Allergic rhinitis (Goal: minimize symptoms of allergies) -Controlled -Current treatment  Atrovent nasal spray PRN Claritin 10 mg 1 tablet daily    Montelukast 10 mg 1 tablet daily -Medications previously tried: none  -Recommended to continue current medication  Glaucoma (Goal: lower intraocular pressure) -Controlled -Current treatment  Latanoprost 0.005% 1 drop in both eyes at bedtime -Medications previously tried: none  -Recommended to continue current medication  Health Maintenance -Vaccine gaps: Shingrix and tetanus -Current therapy:  Benefiber daily -Educated on Cost vs benefit of each product must be carefully weighed by individual consumer -Patient is satisfied with current therapy and denies issues -Recommended to continue current medication  Patient Goals/Self-Care Activities Patient will:  - take medications as prescribed check blood pressure weekly, document, and provide at future appointments  Follow Up Plan: Telephone follow up appointment with care management team member scheduled for: 6 months        Medication Assistance: None required.  Patient affirms current coverage meets needs.  Compliance/Adherence/Medication fill history: Care Gaps: Shingrix, COVID booster  Star-Rating Drugs: Simvastatin 37m - [facility]  [facility] [facility]  [id number]: [phone]: [phone] Uses pill box? Yes - daughter manages Adherence is unclear  We discussed: Current pharmacy is preferred with insurance plan and patient is satisfied with pharmacy services Patient decided to: Continue current medication management strategy  Care Plan and Follow Up Patient Decision:  Patient agrees to Care Plan and Follow-up.  Plan: Telephone follow up appointment with care management team member scheduled for:  4 months  MJeni Salles PharmD BKingPharmacist [phone]

## 2021-02-22 NOTE — Patient Instructions (Signed)
Hi Jason Farmer,  It was so great to speak with you again! Below is a summary of some of the topics we discussed.   Please reach out to me if you have any questions or need anything before our follow up!  Best, Maddie  [name], PharmD, [place] [facility] [place]   Visit Information   Goals Addressed   None    Patient Care Plan: CCM Pharmacy Care Plan     Problem Identified: Problem: Hypertension, Hyperlipidemia, GERD, Depression, Allergic Rhinitis and ocular migraine, glaucoma, and vitamin D deficiency      Long-Range Goal: Patient-Specific Goal   Start Date: 10/15/2020  Expected End Date: 10/15/2021  Recent Progress: On track  Priority: High  Note:   Current Barriers:  Unable to achieve control of cholesterol  Unable to self administer medications as prescribed  Pharmacist Clinical Goal(s):  Patient will achieve adherence to monitoring guidelines and medication adherence to achieve therapeutic efficacy achieve control of cholesterol as evidenced by next lipid panel  through collaboration with PharmD and provider.   Interventions: 1:1 collaboration with Dorothyann Peng, NP regarding development and update of comprehensive plan of care as evidenced by provider attestation and co-signature Inter-disciplinary care team collaboration (see longitudinal plan of care) Comprehensive medication review performed; medication list updated in electronic medical record  Hypertension (BP goal <140/90) -Controlled -Current treatment: Atenolol 25 mg 1/2 tablet daily -Medications previously tried: none -Current home readings: could not provide -Current dietary habits: has cut back on salt a lot and does not eat canned foods; he does admit to looking at package labels for sodium content -Current exercise habits: walks 3-4 miles a day at two different times of the day -Denies hypotensive/hypertensive symptoms -Educated on Importance of home  blood pressure monitoring; -Counseled to monitor BP at home weekly, document, and provide log at future appointments -Recommended to continue current medication  Hyperlipidemia: (LDL goal < 70) -Uncontrolled -Current treatment: Simvastatin 20 mg every other day (taking 1/2 tablet every other day) Ezetimibe 10 mg 1 tablet daily -Medications previously tried:  statins (muscle cramps) -Current dietary patterns: patient eats more chicken than red meat and does not drink alcohol -Current exercise habits:  walks 3-4 miles a day at two different times of the day -Educated on Cholesterol goals;  Benefits of statin for ASCVD risk reduction; -Recommended to continue current medication Educated on proper administration of simvastatin according to directions  Depression/Insomnia (Goal: minimize symptoms of depression and improve quality and quantity of sleep) -Controlled -Current treatment: Mirtazapine 30 mg 1 tablet at bedtime -Medications previously tried/failed: n/a -PHQ9: 10 -Educated on Benefits of medication for symptom control -Recommended to continue current medication  Ocular migraine (Goal: minimize symptoms) -Controlled -Current treatment  Amitriptyline 25 mg 1 tablet at bedtime -Medications previously tried: none  -Recommended to continue current medication  GERD/dysphagia (Goal: minimize symptoms of heartburn or acid reflux) -Controlled -Current treatment  Omeprazole 20 mg 1 capsule daily -Medications previously tried: none  -Recommended to continue current medication Recommended ENT referral  Vitamin D deficiency (Goal: vitamin D 30-100) -Uncontrolled -Current treatment  Vitamin D 5000 units 1 capsule daily -Medications previously tried: none  -Recommended to continue current medication Counseled on drinking plenty of water  Allergic rhinitis (Goal: minimize symptoms of allergies) -Controlled -Current treatment  Atrovent nasal spray PRN Claritin 10 mg 1 tablet  daily    Montelukast 10 mg 1 tablet daily -Medications previously tried: none  -Recommended to continue current medication  Glaucoma (Goal: lower  intraocular pressure) -Controlled -Current treatment  Latanoprost 0.005% 1 drop in both eyes at bedtime -Medications previously tried: none  -Recommended to continue current medication  Health Maintenance -Vaccine gaps: Shingrix and tetanus -Current therapy:  Benefiber daily -Educated on Cost vs benefit of each product must be carefully weighed by individual consumer -Patient is satisfied with current therapy and denies issues -Recommended to continue current medication  Patient Goals/Self-Care Activities Patient will:  - take medications as prescribed check blood pressure weekly, document, and provide at future appointments  Follow Up Plan: Telephone follow up appointment with care management team member scheduled for: 6 months       Patient verbalizes understanding of instructions provided today and agrees to view in [place].  Telephone follow up appointment with pharmacy team member scheduled for: 6 months  Viona Gilmore, [facility]

## 2021-03-10 ENCOUNTER — Other Ambulatory Visit: Payer: Self-pay | Admitting: Adult Health

## 2021-03-10 DIAGNOSIS — K219 Gastro-esophageal reflux disease without esophagitis: Secondary | ICD-10-CM

## 2021-03-12 ENCOUNTER — Other Ambulatory Visit: Payer: Self-pay

## 2021-03-13 ENCOUNTER — Encounter: Payer: Self-pay | Admitting: Adult Health

## 2021-03-13 ENCOUNTER — Ambulatory Visit: Payer: Medicare Other | Admitting: Adult Health

## 2021-03-13 ENCOUNTER — Ambulatory Visit (INDEPENDENT_AMBULATORY_CARE_PROVIDER_SITE_OTHER): Payer: Medicare Other | Admitting: Adult Health

## 2021-03-13 VITALS — BP 110/60 | HR 95 | Temp 98.6°F | Ht 66.5 in | Wt 138.0 lb

## 2021-03-13 DIAGNOSIS — R5383 Other fatigue: Secondary | ICD-10-CM | POA: Diagnosis not present

## 2021-03-13 DIAGNOSIS — F32A Depression, unspecified: Secondary | ICD-10-CM

## 2021-03-13 DIAGNOSIS — F419 Anxiety disorder, unspecified: Secondary | ICD-10-CM | POA: Diagnosis not present

## 2021-03-13 DIAGNOSIS — J310 Chronic rhinitis: Secondary | ICD-10-CM | POA: Diagnosis not present

## 2021-03-13 MED ORDER — ESCITALOPRAM OXALATE 10 MG PO TABS: 10.0000 mg | ORAL_TABLET | Freq: Every day | ORAL | 0 refills | Status: DC

## 2021-03-13 NOTE — Progress Notes (Deleted)
Wt Readings from Last 10 Encounters:  03/13/21 138 lb (62.6 kg)  12/25/20 137 lb 3.2 oz (62.2 kg)  09/26/20 141 lb (64 kg)  08/29/20 143 lb (64.9 kg)  08/03/20 145 lb 9.6 oz (66 kg)  07/24/20 143 lb (64.9 kg)  06/19/20 142 lb (64.4 kg)  04/13/20 138 lb (62.6 kg)  03/16/20 138 lb (62.6 kg)  11/02/19 148 lb (67.1 kg)  ]

## 2021-03-13 NOTE — Progress Notes (Signed)
Subjective:    Patient ID: Jason Farmer, male    DOB: [birth year], 86 y.o.   MRN: [id number]  HPI 86 year old male who  has a past medical history of Anxiety, Arthritis, Chronic lower back pain, Essential tremor, GERD (gastroesophageal reflux disease), Glaucoma, Heart murmur, Hepatitis (~ 1958), HOH (hard of hearing), Hyperlipemia, Migraine, Seasonal allergies, TIA (transient ischemic attack) (04/2015), Wears glasses, and Wears hearing aid.  He presents with his daughter today to discuss his medications   Anxiety and Depression -currently prescribed Remeron 30 mg nightly, this was prescribed roughly a year ago originally to help with insomnia, loss of appetite, and depression dealing with issues at home with his wife and in her state of health being very controlling of him.  Your this was started he did report provement in his depressive symptoms as well as appetite and was sleeping better, more recently he feels very anxious is unable to turn off his mind at night and just feels as though his anxiety is causing a lot of his issues.  In addition he does feel fatigued, lightheadedness, and woozy intermittently.  He reports that there was a mixup last week on his medications and he did not take his evening medications (Remeron, amitriptyline, and Singulair) and felt much better  Placed on Singulair few months ago as a last ditch effort to help with his chronic rhinorrhea as well as postnasal drip.  He has been seen by allergist and multiple ear nose and throat, prescribed multiple medications that have not helped.  He does not feel as though Singulair is helping much.   Review of Systems See HPI   Past Medical History:  Diagnosis Date   Anxiety    "not a problem for a long time" (11/27/2016)   Arthritis    "wrists, knees, shoulders, back" (11/27/2016)   Chronic lower back pain    "5th disc is protruding" (11/27/2016)   Essential tremor    GERD (gastroesophageal reflux disease)    Glaucoma     Heart murmur    dx'd in [employer] in the 1960s; haven't been seen for it since "(11/27/2016)   Hepatitis ~ 1958   "jaundice kind"   HOH (hard of hearing)    Hyperlipemia    Migraine    "none since my neck OR" (11/27/2016)   Seasonal allergies    TIA (transient ischemic attack) 04/2015   Wears glasses    Wears hearing aid     Social History   Socioeconomic History   Marital status: Married    Spouse name: Not on file   Number of children: 4   Years of education: Not on file   Highest education level: Not on file  Occupational History   Not on file  Tobacco Use   Smoking status: Former    Packs/day: 2.00    Years: 30.00    Pack years: 60.00    Types: Cigarettes    Quit date: 06/03/1984    Years since quitting: 36.8   Smokeless tobacco: Never   Tobacco comments:    had AAA at the [place]; also noted 2018 on Dr. Kyla Balzarine note   Substance and Sexual Activity   Alcohol use: No    Comment: 11/27/2016 "quit in 1985"   Drug use: No   Sexual activity: Not Currently  Other Topics Concern   Not on file  Social History Narrative   Retired from [employer]   Married    4 children, one son lives locally  Social Determinants of Health   Financial Resource Strain: Low Risk    Difficulty of Paying Living Expenses: Not hard at all  Food Insecurity: No Food Insecurity   Worried About Charity fundraiser in the Last Year: Never true   [place] in the Last Year: Never true  Transportation Needs: No Transportation Needs   Lack of Transportation (Medical): No   Lack of Transportation (Non-Medical): No  Physical Activity: Sufficiently Active   Days of Exercise per Week: 5 days   Minutes of Exercise per Session: 60 min  Stress: Stress Concern Present   Feeling of Stress : To some extent  Social Connections: Moderately Isolated   Frequency of Communication with Friends and Family: Twice a week   Frequency of Social Gatherings with Friends and Family: Once a week   Attends Religious  Services: Never   Marine scientist or Organizations: No   Attends Music therapist: Never   Marital Status: Married  Human resources officer Violence: Not At Risk   Fear of Current or Ex-Partner: No   Emotionally Abused: No   Physically Abused: No   Sexually Abused: No    Past Surgical History:  Procedure Laterality Date   ANTERIOR CERVICAL DECOMP/DISCECTOMY FUSION  2007   APPENDECTOMY     BACK SURGERY     CARPAL TUNNEL RELEASE Right 10/12/2013   Procedure: RIGHT CARPAL TUNNEL RELEASE;  Surgeon: Wynonia Sours, MD;  Location: [facility];  Service: Orthopedics;  Laterality: Right;   CATARACT EXTRACTION W/ INTRAOCULAR LENS  IMPLANT, BILATERAL Bilateral    COLONOSCOPY     HEMORRHOID SURGERY     KNEE ARTHROSCOPY Left 1970s   NASAL SEPTOPLASTY W/ TURBINOPLASTY Bilateral 02/15/2013   Procedure: NASAL SEPTOPLASTY WITH BILATER TURBINATE REDUCTION;  Surgeon: Ascencion Dike, MD;  Location: [facility];  Service: ENT;  Laterality: Bilateral;   TONSILLECTOMY     TRIGGER FINGER RELEASE  06/08/2012   Procedure: RELEASE TRIGGER FINGER/A-1 PULLEY;  Surgeon: Wynonia Sours, MD;  Location: [facility];  Service: Orthopedics;  Laterality: Left;  Release A-1 Pulley Left Long Finger   TRIGGER FINGER RELEASE  07/14/2012   Procedure: RELEASE TRIGGER FINGER/A-1 PULLEY;  Surgeon: Wynonia Sours, MD;  Location: [facility];  Service: Orthopedics;  Laterality: Right;   TRIGGER FINGER RELEASE Right 10/12/2013   Procedure: RELEASE TRIGGER FINGER/A-1 PULLEY RIGHT MIDDLE FINGER;  Surgeon: Wynonia Sours, MD;  Location: [facility];  Service: Orthopedics;  Laterality: Right;   UPPER GASTROINTESTINAL ENDOSCOPY      Family History  Problem Relation Age of Onset   Dementia Father    Heart failure Father    Glaucoma Mother    Colon cancer Neg Hx     Allergies  Allergen Reactions   Statins Other (See Comments)    Muscle cramps    Aspirin  Other (See Comments)    bleeding    Current Outpatient Medications on File Prior to Visit  Medication Sig Dispense Refill   amitriptyline (ELAVIL) 25 MG tablet TAKE ONE TABLET AT BEDTIME. 90 tablet 1   atenolol (TENORMIN) 25 MG tablet TAKE (1/2) TABLET DAILY. 45 tablet 3   Cholecalciferol (VITAMIN D3) 125 MCG (5000 UT) CAPS Take 1 capsule by mouth daily.      ezetimibe (ZETIA) 10 MG tablet TAKE 1 TABLET EACH DAY. 90 tablet 0   latanoprost (XALATAN) 0.005 % ophthalmic solution Place 1 drop into both  eyes at bedtime.      mirtazapine (REMERON) 30 MG tablet TAKE ONE TABLET AT BEDTIME. 90 tablet 1   montelukast (SINGULAIR) 10 MG tablet TAKE ONE TABLET BY MOUTH AT BEDTIME. 30 tablet 1   omeprazole (PRILOSEC) 20 MG capsule TAKE ONE CAPSULE BY MOUTH DAILY 90 capsule 0   simvastatin (ZOCOR) 20 MG tablet Take 1 tablet (20 mg total) by mouth every other day. 45 tablet 3   No current facility-administered medications on file prior to visit.    BP 110/60   Pulse 95   Temp 98.6 F (37 C) (Oral)   Ht 5' 6.5" (1.689 m)   Wt 138 lb (62.6 kg)   SpO2 96%   BMI 21.94 kg/m       Objective:   Physical Exam Vitals and nursing note reviewed.  Constitutional:      Appearance: Normal appearance.  Cardiovascular:     Rate and Rhythm: Normal rate and regular rhythm.     Pulses: Normal pulses.     Heart sounds: Normal heart sounds.  Pulmonary:     Effort: Pulmonary effort is normal.     Breath sounds: Normal breath sounds.  Musculoskeletal:        General: Normal range of motion.  Skin:    General: Skin is warm and dry.  Neurological:     General: No focal deficit present.     Mental Status: He is alert and oriented to person, place, and time.  Psychiatric:        Mood and Affect: Mood normal.        Behavior: Behavior normal.        Thought Content: Thought content normal.      Assessment & Plan:  1. Anxiety and depression -We will start him on Lexapro 10 mg daily.  Have him follow-up  in 30 days or sooner if needed  2. Fatigue, unspecified type -We will DC Remeron and Singulair.  Hopefully this helps with the fatigue aspect  3. Chronic rhinitis -DC Singulair as it was not working.  He can follow-up with ear nose and throat or allergy see if they have any other ideas  Dorothyann Peng, NP  Time spent on chart review, time with patient; discussion of anxiety and depression, treatment, follow up plan, and documentation 45 minutes

## 2021-03-13 NOTE — Patient Instructions (Addendum)
I am going to discontinue you Remeron and Singulair   I am going to try you on Lexapro for anxiety and depression.   I would like to see you back in 30 days

## 2021-03-22 ENCOUNTER — Other Ambulatory Visit: Payer: Self-pay | Admitting: Adult Health

## 2021-03-22 DIAGNOSIS — G43109 Migraine with aura, not intractable, without status migrainosus: Secondary | ICD-10-CM

## 2021-04-01 ENCOUNTER — Emergency Department (HOSPITAL_COMMUNITY): Payer: Medicare Other

## 2021-04-01 ENCOUNTER — Emergency Department (HOSPITAL_COMMUNITY)
Admission: EM | Admit: 2021-04-01 | Discharge: 2021-04-02 | Disposition: A | Discharge status: Home / Self Care | Payer: Medicare Other | Attending: Emergency Medicine | Admitting: Emergency Medicine

## 2021-04-01 ENCOUNTER — Other Ambulatory Visit: Payer: Self-pay

## 2021-04-01 ENCOUNTER — Encounter (HOSPITAL_COMMUNITY): Payer: Self-pay

## 2021-04-01 ENCOUNTER — Telehealth: Payer: Self-pay | Admitting: Pharmacist

## 2021-04-01 DIAGNOSIS — L03116 Cellulitis of left lower limb: Secondary | ICD-10-CM | POA: Insufficient documentation

## 2021-04-01 DIAGNOSIS — M7989 Other specified soft tissue disorders: Secondary | ICD-10-CM | POA: Insufficient documentation

## 2021-04-01 DIAGNOSIS — M25552 Pain in left hip: Secondary | ICD-10-CM | POA: Diagnosis not present

## 2021-04-01 DIAGNOSIS — G319 Degenerative disease of nervous system, unspecified: Secondary | ICD-10-CM | POA: Diagnosis not present

## 2021-04-01 DIAGNOSIS — M542 Cervicalgia: Secondary | ICD-10-CM | POA: Insufficient documentation

## 2021-04-01 DIAGNOSIS — Z20822 Contact with and (suspected) exposure to covid-19: Secondary | ICD-10-CM | POA: Insufficient documentation

## 2021-04-01 DIAGNOSIS — R251 Tremor, unspecified: Secondary | ICD-10-CM | POA: Insufficient documentation

## 2021-04-01 DIAGNOSIS — I10 Essential (primary) hypertension: Secondary | ICD-10-CM | POA: Insufficient documentation

## 2021-04-01 DIAGNOSIS — S51012A Laceration without foreign body of left elbow, initial encounter: Secondary | ICD-10-CM | POA: Diagnosis not present

## 2021-04-01 DIAGNOSIS — M545 Low back pain, unspecified: Secondary | ICD-10-CM | POA: Diagnosis not present

## 2021-04-01 DIAGNOSIS — Z043 Encounter for examination and observation following other accident: Secondary | ICD-10-CM | POA: Diagnosis not present

## 2021-04-01 DIAGNOSIS — W19XXXA Unspecified fall, initial encounter: Secondary | ICD-10-CM

## 2021-04-01 DIAGNOSIS — W1839XA Other fall on same level, initial encounter: Secondary | ICD-10-CM | POA: Insufficient documentation

## 2021-04-01 DIAGNOSIS — R58 Hemorrhage, not elsewhere classified: Secondary | ICD-10-CM | POA: Diagnosis not present

## 2021-04-01 DIAGNOSIS — R6883 Chills (without fever): Secondary | ICD-10-CM | POA: Diagnosis not present

## 2021-04-01 DIAGNOSIS — Z87891 Personal history of nicotine dependence: Secondary | ICD-10-CM | POA: Diagnosis not present

## 2021-04-01 DIAGNOSIS — M7122 Synovial cyst of popliteal space [Baker], left knee: Secondary | ICD-10-CM | POA: Diagnosis not present

## 2021-04-01 DIAGNOSIS — M549 Dorsalgia, unspecified: Secondary | ICD-10-CM | POA: Diagnosis not present

## 2021-04-01 DIAGNOSIS — J80 Acute respiratory distress syndrome: Secondary | ICD-10-CM | POA: Diagnosis not present

## 2021-04-01 DIAGNOSIS — S59902A Unspecified injury of left elbow, initial encounter: Secondary | ICD-10-CM | POA: Diagnosis present

## 2021-04-01 DIAGNOSIS — R5383 Other fatigue: Secondary | ICD-10-CM | POA: Insufficient documentation

## 2021-04-01 DIAGNOSIS — M25551 Pain in right hip: Secondary | ICD-10-CM | POA: Diagnosis not present

## 2021-04-01 DIAGNOSIS — R0902 Hypoxemia: Secondary | ICD-10-CM | POA: Diagnosis not present

## 2021-04-01 MED ORDER — SODIUM CHLORIDE 0.9 % IV BOLUS
500.0000 mL | Freq: Once | INTRAVENOUS | Status: AC
  Administered 2021-04-02: 500 mL via INTRAVENOUS

## 2021-04-01 NOTE — Chronic Care Management (AMB) (Signed)
Chronic Care Management Pharmacy Assistant   Name: Jason Farmer  MRN: [id number] DOB: 1935/02/07   Reason for Encounter: Disease State / Hypertension Assessment Call   Conditions to be addressed/monitored: HTN  Recent office visits:  03-13-2021 Dorothyann Peng, NP - Patient presented for Anxiety and depression and other concerns. Prescribed Escitalopram 10 mg daily. Stopped Loratadine and Mirtazapine and Montelukast Sodium.  Hospital visits:  Medication Reconciliation was completed by comparing discharge summary, patient's EMR and Pharmacy list, and upon discussion with patient.  Patient presented to [facility] ED on 04-01-2021 due to Cellulitis of left leg. He was there for 21 hours.  New?Medications Started at [facility] Discharge:?? -started  Keflex 500 mg - Take 1 capsule  by mouth 4 (four) times daily for 7 days. NORCO/VICODIN 5-325 MG-Take 1 tablet by mouth every 6 (six) hours as needed.  Medication Changes at Hospital Discharge: -Changed  None  Medications Discontinued at Hospital Discharge: -Stopped  None  Medications that remain the same after Hospital Discharge:??  -All other medications will remain the same.    Medications: Outpatient Encounter Medications as of 04/01/2021  Medication Sig   amitriptyline (ELAVIL) 25 MG tablet TAKE ONE TABLET AT BEDTIME.   atenolol (TENORMIN) 25 MG tablet TAKE (1/2) TABLET DAILY.   Cholecalciferol (VITAMIN D3) 125 MCG (5000 UT) CAPS Take 1 capsule by mouth daily.    escitalopram (LEXAPRO) 10 MG tablet Take 1 tablet (10 mg total) by mouth daily.   ezetimibe (ZETIA) 10 MG tablet TAKE 1 TABLET EACH DAY.   latanoprost (XALATAN) 0.005 % ophthalmic solution Place 1 drop into both eyes at bedtime.    omeprazole (PRILOSEC) 20 MG capsule TAKE ONE CAPSULE BY MOUTH DAILY   simvastatin (ZOCOR) 20 MG tablet Take 1 tablet (20 mg total) by mouth every other day.   No facility-administered encounter medications on file as of  04/01/2021.   Reviewed chart prior to disease state call. Spoke with patient regarding BP  Recent Office Vitals: BP Readings from Last 3 Encounters:  03/13/21 110/60  12/25/20 136/72  09/26/20 118/78   Pulse Readings from Last 3 Encounters:  03/13/21 95  12/25/20 82  08/29/20 63    Wt Readings from Last 3 Encounters:  03/13/21 138 lb (62.6 kg)  12/25/20 137 lb 3.2 oz (62.2 kg)  09/26/20 141 lb (64 kg)     Kidney Function Lab Results  Component Value Date/Time   CREATININE 1.34 09/26/2020 10:47 AM   CREATININE 1.46 (H) 03/16/2020 03:06 PM   CREATININE 1.34 08/30/2019 08:26 AM   GFR 49.02 (L) 09/26/2020 10:47 AM   GFRNONAA 44 (L) 03/16/2020 03:06 PM   GFRAA 51 (L) 03/16/2020 03:06 PM    BMP Latest Ref Rng & Units 09/26/2020 03/16/2020 08/30/2019  Glucose 70 - 99 mg/dL 92 94 101(H)  BUN 6 - 23 mg/dL '18 18 18  '$ Creatinine 0.40 - 1.50 mg/dL 1.34 1.46(H) 1.34  BUN/Creat Ratio 6 - 22 (calc) - 12 -  Sodium 135 - 145 mEq/L 137 138 138  Potassium 3.5 - 5.1 mEq/L 4.9 4.6 5.0  Chloride 96 - 112 mEq/L 102 104 103  CO2 19 - 32 mEq/L '29 28 29  '$ Calcium 8.4 - 10.5 mg/dL 9.8 9.5 9.5    Current antihypertensive regimen:  Atenolol 25 mg 1/2 tablet daily How often are you checking your Blood Pressure? Patient reports he is not checking his blood pressures at home. He reports he was just in the hospital a couple of  days ago and as far as he knows it has been fine.  Current home BP readings: ED reading was 106/70 p 79 What recent interventions/DTPs have been made by any provider to improve Blood Pressure control since last CPP Visit: Patient reports none Any recent hospitalizations or ED visits since last visit with CPP? Yes What diet changes have been made to improve Blood Pressure Control?  Patient reports he has no appetite at all. He reports his wife prepares his meals, for breakfast will eat cereal, for lunch a sandwich or something like that. He reports he is not eating very much. What  exercise is being done to improve your Blood Pressure Control?  Patient reports he is able to get around well and walks indoors is not using an assisted device.   Adherence Review: Is the patient currently on ACE/ARB medication? No Does the patient have >5 day gap between last estimated fill dates? No   Care Gaps: Zoster Vaccine- Overdue COVID #4 Booster - Overdue Flu Vaccine - Overdue  Star Rating Drugs:  Simvastatin 20 mg - Last filled 01-02-2021 90 DS at [facility]   Notes: 04-01-21 left vm   Ned Clines [facility] Clinical Pharmacist Assistant [phone]

## 2021-04-01 NOTE — ED Notes (Signed)
Pt AxO x4. C/o lower back pain and R hip pain. Full sensation. No numbness/tingling. LLE is slightly reddened and slightly warmer than RLE.

## 2021-04-01 NOTE — ED Triage Notes (Signed)
Patient arrived by [facility] following mechanical fall x 2 last night. No loc. Complains of lower back pain and some mild hip pain. Pain worse with ambulation. Also has skin tear to left ankle and bandaged prior to arrival.

## 2021-04-01 NOTE — ED Provider Notes (Signed)
Emergency Medicine Provider Triage Evaluation Note  Jason Farmer , a 86 y.o. male  was evaluated in triage.  Pt complains of bilateral hip pain and low back pain after falling the night.  States he sleeping yes bedroom the last few days and got disoriented when he got up to go to the restroom.  States it was pitch black and he ran to a dresser he forgot was there and fell to the ground.  Also does endorse some head trauma and single episode of intention tremor yesterday.  He is not anticoagulated..  Review of Systems  Positive: Back pain, hip pain, tremor Negative: LOC, blurry or double vision, nausea, vomiting  Physical Exam  BP (!) 118/59   Pulse 94   Temp 99 F (37.2 C) (Oral)   Resp 16   SpO2 100%  Gen:   Awake, no distress   Resp:  Normal effort  MSK:   Moves extremities without difficulty  Other:  Tenderness palpation over the bilateral PSIS, patient experiencing intermittent muscular spasm.  Medical Decision Making  Medically screening exam initiated at 1:47 PM.  Appropriate orders placed.  [name] was informed that the remainder of the evaluation will be completed by another provider, this initial triage assessment does not replace that evaluation, and the importance of remaining in the ED until their evaluation is complete.  This chart was dictated using voice recognition software, Dragon. Despite the best efforts of this provider to proofread and correct errors, errors may still occur which can change documentation meaning.    Emeline Darling, PA-C 04/01/21 1348    Godfrey Pick, MD 04/01/21 Karl Bales

## 2021-04-01 NOTE — ED Provider Notes (Signed)
[facility] EMERGENCY DEPARTMENT Provider Note   CSN: [id number] Arrival date & time: 04/01/21  1326     History No chief complaint on file.   Jason Farmer is a 86 y.o. male.  Patient to ED with daughter who reports multiple falls over the last 2 days. Since Friday he has had symptoms of not feeling well, increased sleeping, episodes of rigors. No known fever. No nausea, vomiting, urinary symptoms. He has pain and redness in the left lower leg but cannot definitively state when it started. He has cough and chest congestion but daughter corroborates that this has been an ongoing issue for a while and has been worked up multiple times without certain diagnosis. He is not anticoagulated. He reports that he has the worst pain from the falls in the right buttock. He also reports pain in the neck "at the base of my skull" since falling as well.   The history is provided by the patient. No language interpreter was used.      Past Medical History:  Diagnosis Date   Anxiety    "not a problem for a long time" (11/27/2016)   Arthritis    "wrists, knees, shoulders, back" (11/27/2016)   Chronic lower back pain    "5th disc is protruding" (11/27/2016)   Essential tremor    GERD (gastroesophageal reflux disease)    Glaucoma    Heart murmur    dx'd in [employer] in the 1960s; haven't been seen for it since "(11/27/2016)   Hepatitis ~ 1958   "jaundice kind"   HOH (hard of hearing)    Hyperlipemia    Migraine    "none since my neck OR" (11/27/2016)   Seasonal allergies    TIA (transient ischemic attack) 04/2015   Wears glasses    Wears hearing aid     Patient Active Problem List   Diagnosis Date Noted   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   Essential hypertension, benign 12/07/2014   Muscle cramps 03/14/2014   Allergic rhinitis, seasonal 04/06/2012   PERSONAL HX COLONIC POLYPS 08/06/2010   GERD 11/05/2009   RESTLESS LEG SYNDROME 10/04/2009   Hyperlipidemia 08/23/2009   ANXIETY  DISORDER, GENERALIZED 08/23/2009   TREMOR, ESSENTIAL 08/23/2009   ERECTILE DYSFUNCTION, 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    Past Surgical History:  Procedure Laterality Date   ANTERIOR CERVICAL DECOMP/DISCECTOMY FUSION  2007   APPENDECTOMY     BACK SURGERY     CARPAL TUNNEL RELEASE Right 10/12/2013   Procedure: RIGHT CARPAL TUNNEL RELEASE;  Surgeon: Wynonia Sours, MD;  Location: [place];  Service: Orthopedics;  Laterality: Right;   CATARACT EXTRACTION W/ INTRAOCULAR LENS  IMPLANT, BILATERAL Bilateral    COLONOSCOPY     HEMORRHOID SURGERY     KNEE ARTHROSCOPY Left 1970s   NASAL SEPTOPLASTY W/ TURBINOPLASTY Bilateral 02/15/2013   Procedure: NASAL SEPTOPLASTY WITH BILATER TURBINATE REDUCTION;  Surgeon: Ascencion Dike, MD;  Location: [place];  Service: ENT;  Laterality: Bilateral;   TONSILLECTOMY     TRIGGER FINGER RELEASE  06/08/2012   Procedure: RELEASE TRIGGER FINGER/A-1 PULLEY;  Surgeon: Wynonia Sours, MD;  Location: [place];  Service: Orthopedics;  Laterality: Left;  Release A-1 Pulley Left Long Finger   TRIGGER FINGER RELEASE  07/14/2012   Procedure: RELEASE TRIGGER FINGER/A-1 PULLEY;  Surgeon: Wynonia Sours, MD;  Location: [place];  Service: Orthopedics;  Laterality: Right;   TRIGGER FINGER RELEASE Right 10/12/2013   Procedure: RELEASE TRIGGER  FINGER/A-1 PULLEY RIGHT MIDDLE FINGER;  Surgeon: Wynonia Sours, MD;  Location: [facility];  Service: Orthopedics;  Laterality: Right;   UPPER GASTROINTESTINAL ENDOSCOPY         Family History  Problem Relation Age of Onset   Dementia Father    Heart failure Father    Glaucoma Mother    Colon cancer Neg Hx     Social History   Tobacco Use   Smoking status: Former    Packs/day: 2.00    Years: 30.00    Pack years: 60.00    Types: Cigarettes    Quit date: 06/03/1984    Years since quitting: 36.8   Smokeless tobacco: Never   Tobacco comments:    had AAA at the [place]; also  noted 2018 on Dr. Kyla Balzarine note   Substance Use Topics   Alcohol use: No    Comment: 11/27/2016 "quit in 1985"   Drug use: No    Home Medications Prior to Admission medications   Medication Sig Start Date End Date Taking? Authorizing Provider  amitriptyline (ELAVIL) 25 MG tablet TAKE ONE TABLET AT BEDTIME. 03/26/21   Nafziger, Tommi Rumps, NP  atenolol (TENORMIN) 25 MG tablet TAKE (1/2) TABLET DAILY. 08/16/20   Nafziger, Tommi Rumps, NP  Cholecalciferol (VITAMIN D3) 125 MCG (5000 UT) CAPS Take 1 capsule by mouth daily.     [provider]  escitalopram (LEXAPRO) 10 MG tablet Take 1 tablet (10 mg total) by mouth daily. 03/13/21   Nafziger, Tommi Rumps, NP  ezetimibe (ZETIA) 10 MG tablet TAKE 1 TABLET EACH DAY. 03/26/21   Nafziger, Tommi Rumps, NP  latanoprost (XALATAN) 0.005 % ophthalmic solution Place 1 drop into both eyes at bedtime.     [provider]  omeprazole (PRILOSEC) 20 MG capsule TAKE ONE CAPSULE BY MOUTH DAILY 03/11/21   Nafziger, Tommi Rumps, NP  simvastatin (ZOCOR) 20 MG tablet Take 1 tablet (20 mg total) by mouth every other day. 09/27/20   Nafziger, Tommi Rumps, NP    Allergies    Statins and Aspirin  Review of Systems   Review of Systems  Constitutional:  Positive for activity change, chills and fatigue. Negative for fever.  HENT: Negative.  Negative for congestion.   Respiratory:  Positive for cough.   Cardiovascular: Negative.  Negative for chest pain.  Gastrointestinal: Negative.  Negative for abdominal pain, diarrhea and vomiting.  Musculoskeletal:        See HPI.  Skin: Negative.   Neurological: Negative.    Physical Exam Updated Vital Signs BP 136/82 (BP Location: Left Arm)   Pulse 80   Temp 98 F (36.7 C) (Oral)   Resp 17   SpO2 100%   Physical Exam Vitals and nursing note reviewed.  Constitutional:      General: He is not in acute distress.    Appearance: Normal appearance. He is well-developed. He is not toxic-appearing.  HENT:     Head: Normocephalic and atraumatic.      Nose: Nose normal.  Cardiovascular:     Rate and Rhythm: Normal rate and regular rhythm.     Heart sounds: No murmur heard. Pulmonary:     Effort: Pulmonary effort is normal.     Breath sounds: Rales (Bilateral) present. No wheezing or rhonchi.  Abdominal:     General: Bowel sounds are normal.     Palpations: Abdomen is soft.     Tenderness: There is no abdominal tenderness. There is no guarding or rebound.  Musculoskeletal:  General: Normal range of motion.     Cervical back: Normal range of motion and neck supple.     Comments: Left lower leg red and warm to touch. Skin tear distal anteromedial leg, shallow, no drainage or surrounding erythema.   Skin:    General: Skin is warm and dry.     Comments: Skin tear to left elbow.  Neurological:     General: No focal deficit present.     Mental Status: He is alert and oriented to person, place, and time.    ED Results / Procedures / Treatments   Labs (all labs ordered are listed, but only abnormal results are displayed) Labs Reviewed  URINE CULTURE  RESP PANEL BY RT-PCR (FLU A&B, COVID) ARPGX2  CBC WITH DIFFERENTIAL/PLATELET  LACTIC ACID, PLASMA  LACTIC ACID, PLASMA  COMPREHENSIVE METABOLIC PANEL  URINALYSIS, ROUTINE W REFLEX MICROSCOPIC    EKG None  Radiology DG Lumbar Spine Complete  Result Date: 04/01/2021 CLINICAL DATA:  [place] today, lower back pain radiating to hips EXAM: LUMBAR SPINE - COMPLETE 4+ VIEW COMPARISON:  03/16/2015 FINDINGS: Osseous demineralization. Five non-rib-bearing lumbar vertebra. Scattered disc space narrowing and endplate spur formation, greatest degree of disc space narrowing noted at L4-L5. Vertebral body heights maintained without fracture or subluxation. No bone destruction or spondylolysis. SI joints preserved. IMPRESSION: Osseous demineralization with degenerative disc disease changes lumbar spine. No acute abnormalities. Electronically Signed   By: Lavonia Dana M.D.   On: 04/01/2021 15:19    CT Head Wo Contrast  Result Date: 04/01/2021 CLINICAL DATA:  Mechanical fall 2 nights ago, tremor, no loss of consciousness EXAM: CT HEAD WITHOUT CONTRAST TECHNIQUE: Contiguous axial images were obtained from the base of the skull through the vertex without intravenous contrast. Sagittal and coronal MPR images reconstructed from axial data set. COMPARISON:  08/04/2018 FINDINGS: Brain: Generalized atrophy. Normal ventricular morphology. No midline shift or mass effect. Small vessel chronic ischemic changes of deep cerebral white matter. No intracranial hemorrhage or evidence of acute infarction. Slight prominent cisterna magna unchanged. Calcified mass again identified at LEFT lateral aspect of posterior fossa, 14 x 9 x 13 mm, favor calcified meningioma, unchanged. Vascular: No hyperdense vessels Skull: Intact Sinuses/Orbits: Clear Other: N/A IMPRESSION: Atrophy with small vessel chronic ischemic changes of deep cerebral white matter. Stable calcified mass at LEFT lateral aspect of the posterior fossa 14 x 9 x 13 mm, favor calcified meningioma. No acute intracranial abnormalities. Electronically Signed   By: Lavonia Dana M.D.   On: 04/01/2021 15:25   DG HIP UNILAT WITH PELVIS 2-3 VIEWS LEFT  Result Date: 04/01/2021 CLINICAL DATA:  [place] today, lower back pain radiating to hips EXAM: DG HIP (WITH OR WITHOUT PELVIS) 2-3V LEFT COMPARISON:  None FINDINGS: Mild osseous demineralization. LEFT hip and SI joint spaces preserved. No acute fracture, dislocation, or bone destruction. IMPRESSION: No acute osseous abnormalities. Electronically Signed   By: Lavonia Dana M.D.   On: 04/01/2021 15:18   DG HIP UNILAT WITH PELVIS 2-3 VIEWS RIGHT  Result Date: 04/01/2021 CLINICAL DATA:  [place] today, lower back pain radiating to hips EXAM: DG HIP (WITH OR WITHOUT PELVIS) 2-3V RIGHT COMPARISON:  02/05/2015 FINDINGS: Osseous demineralization. Hip and SI joint spaces preserved. No acute fracture, dislocation, or bone destruction.  IMPRESSION: No acute osseous abnormalities. Electronically Signed   By: Lavonia Dana M.D.   On: 04/01/2021 15:21    Procedures Procedures   Medications Ordered in ED Medications  sodium chloride 0.9 % bolus 500 mL (has no  administration in time range)    ED Course  I have reviewed the triage vital signs and the nursing notes.  Pertinent labs & imaging results that were available during my care of the patient were reviewed by me and considered in my medical decision making (see chart for details).    MDM Rules/Calculators/A&P                           Patient to ED with daughter for evaluation of ss/sxs as detailed in the HPI.   He is overall nontoxic in appearance, does not appear uncomfortable. VSS, afebrile in ED. Infectious cause investigated - no leukocytosis, neg Lactic acid, no PNA. Concern for cellulitis in left lower leg as it is red and warm. Consider DVT as well, and will need doppler study. UA pending. IV Rocephin provided to start abx therapy for cellulitis, which will cover UTI if positive.   Head CT, neck CT performed for evaluation given recent fall - negative for acute findings.   UA resulted and is negative for infection. There is hematuria of uncertain significance. Will refer to primary care to recheck in one week.   Doppler will need to be performed. The patient has had an extended stay in the ED due to census. Doppler available in less than 3 hours. Will keep him here to obtain study rather than discharge to return in that timeframe given length of stay. Daughter will be available to pick him up when resulted.   Patient care signed out to oncoming provider team.  Final Clinical Impression(s) / ED Diagnoses Final diagnoses:  Fall   Cellulitis left LE  Rx / DC Orders ED Discharge Orders     None        Charlann Lange, PA-C 04/02/21 0456    Merryl Hacker, MD 04/02/21 [phone]

## 2021-04-02 ENCOUNTER — Telehealth: Payer: Self-pay | Admitting: *Deleted

## 2021-04-02 ENCOUNTER — Emergency Department (HOSPITAL_COMMUNITY): Payer: Medicare Other

## 2021-04-02 ENCOUNTER — Emergency Department (HOSPITAL_BASED_OUTPATIENT_CLINIC_OR_DEPARTMENT_OTHER): Payer: Medicare Other

## 2021-04-02 DIAGNOSIS — M542 Cervicalgia: Secondary | ICD-10-CM | POA: Diagnosis not present

## 2021-04-02 DIAGNOSIS — M79605 Pain in left leg: Secondary | ICD-10-CM | POA: Diagnosis not present

## 2021-04-02 DIAGNOSIS — S51012A Laceration without foreign body of left elbow, initial encounter: Secondary | ICD-10-CM | POA: Diagnosis not present

## 2021-04-02 DIAGNOSIS — R6883 Chills (without fever): Secondary | ICD-10-CM | POA: Diagnosis not present

## 2021-04-02 DIAGNOSIS — J80 Acute respiratory distress syndrome: Secondary | ICD-10-CM | POA: Diagnosis not present

## 2021-04-02 LAB — CBC WITH DIFFERENTIAL/PLATELET
Abs Immature Granulocytes: 0.03 10*3/uL (ref 0.00–0.07)
Basophils Absolute: 0 10*3/uL (ref 0.0–0.1)
Basophils Relative: 0 %
Eosinophils Absolute: 0.2 10*3/uL (ref 0.0–0.5)
Eosinophils Relative: 3 %
HCT: 41.1 % (ref 39.0–52.0)
Hemoglobin: 13.7 g/dL (ref 13.0–17.0)
Immature Granulocytes: 0 %
Lymphocytes Relative: 21 %
Lymphs Abs: 1.7 10*3/uL (ref 0.7–4.0)
MCH: 31 pg (ref 26.0–34.0)
MCHC: 33.3 g/dL (ref 30.0–36.0)
MCV: 93 fL (ref 80.0–100.0)
Monocytes Absolute: 0.7 10*3/uL (ref 0.1–1.0)
Monocytes Relative: 8 %
Neutro Abs: 5.6 10*3/uL (ref 1.7–7.7)
Neutrophils Relative %: 68 %
Platelets: 229 10*3/uL (ref 150–400)
RBC: 4.42 MIL/uL (ref 4.22–5.81)
RDW: 12.7 % (ref 11.5–15.5)
WBC: 8.3 10*3/uL (ref 4.0–10.5)
nRBC: 0 % (ref 0.0–0.2)

## 2021-04-02 LAB — URINALYSIS, ROUTINE W REFLEX MICROSCOPIC
Bacteria, UA: NONE SEEN
Bilirubin Urine: NEGATIVE
Glucose, UA: NEGATIVE mg/dL
Ketones, ur: 20 mg/dL — AB
Leukocytes,Ua: NEGATIVE
Nitrite: NEGATIVE
Protein, ur: NEGATIVE mg/dL
Specific Gravity, Urine: 1.012 (ref 1.005–1.030)
pH: 5 (ref 5.0–8.0)

## 2021-04-02 LAB — RESP PANEL BY RT-PCR (FLU A&B, COVID) ARPGX2
Influenza A by PCR: NEGATIVE
Influenza B by PCR: NEGATIVE
SARS Coronavirus 2 by RT PCR: NEGATIVE

## 2021-04-02 LAB — COMPREHENSIVE METABOLIC PANEL
ALT: 14 U/L (ref 0–44)
AST: 19 U/L (ref 15–41)
Albumin: 3.3 g/dL — ABNORMAL LOW (ref 3.5–5.0)
Alkaline Phosphatase: 45 U/L (ref 38–126)
Anion gap: 11 (ref 5–15)
BUN: 15 mg/dL (ref 8–23)
CO2: 23 mmol/L (ref 22–32)
Calcium: 9 mg/dL (ref 8.9–10.3)
Chloride: 101 mmol/L (ref 98–111)
Creatinine, Ser: 1.19 mg/dL (ref 0.61–1.24)
GFR, Estimated: >60 mL/min (ref >60)
Glucose, Bld: 83 mg/dL (ref 70–99)
Potassium: 3.7 mmol/L (ref 3.5–5.1)
Sodium: 135 mmol/L (ref 135–145)
Total Bilirubin: 0.7 mg/dL (ref 0.3–1.2)
Total Protein: 6.1 g/dL — ABNORMAL LOW (ref 6.5–8.1)

## 2021-04-02 LAB — LACTIC ACID, PLASMA: Lactic Acid, Venous: 1 mmol/L (ref 0.5–1.9)

## 2021-04-02 MED ORDER — SODIUM CHLORIDE 0.9 % IV SOLN
1.0000 g | Freq: Once | INTRAVENOUS | Status: AC
  Administered 2021-04-02: 1 g via INTRAVENOUS
  Filled 2021-04-02: qty 10

## 2021-04-02 MED ORDER — CEPHALEXIN 500 MG PO CAPS: 500.0000 mg | ORAL_CAPSULE | Freq: Four times a day (QID) | ORAL | 0 refills | Status: AC

## 2021-04-02 MED ORDER — HYDROCODONE-ACETAMINOPHEN 5-325 MG PO TABS: 1.0000 | ORAL_TABLET | Freq: Four times a day (QID) | ORAL | 0 refills | Status: DC | PRN

## 2021-04-02 NOTE — ED Notes (Addendum)
Charlann Lange, PA-C informed of pts HR jumping to 140s with some hypotension

## 2021-04-02 NOTE — ED Notes (Signed)
I asked pt if he needs to pee and he says no. I gave him some more water. Laying on R side because it hurts his neck/back too much laying flat.

## 2021-04-02 NOTE — Progress Notes (Signed)
Lower extremity venous has been completed.   Preliminary results in CV Proc.   Abram Sander 04/02/2021 7:52 AM

## 2021-04-02 NOTE — Discharge Instructions (Addendum)
At this time there does not appear to be the presence of an emergent medical condition, however there is always the potential for conditions to change. Please read and follow the below instructions.  Please return to the Emergency Department immediately for any new or worsening symptoms. Please be sure to follow up with your Primary Care Provider within one week regarding your visit today; please call their office to schedule an appointment even if you are feeling better for a follow-up visit. Please take your antibiotic Keflex as prescribed until complete to help with your symptoms.  Please drink enough water to avoid dehydration and get plenty of rest. Please call your orthopedic specialist for further evaluation and treatment of the Baker's cyst in your left knee. Please follow-up with your primary care provider this week for recheck of your left leg infection.  Please drink plenty of water and get plenty of rest. You may use a walker to help protect your right hip until your follow-up with the orthopedic specialist. You may take the medication Norco (Hydrocodone/Acetaminophen) as prescribed to help with severe pain.  This medication will make you drowsy so do not drive, drink alcohol, take other sedating medications or perform any dangerous activities such as driving after taking Norco. Norco contains Tylenol (acetaminophen) so do not take any other Tylenol-containing products with Norco. If the colors you are seeing and hallucinations not improve with some rest or if they worsen at any time please return immediately to the ER.   Go to the nearest Emergency Department immediately if: You have fever or chills You feel very sleepy. You have headache or vision changes You throw up (vomit) or have watery poop (diarrhea) for a long time. You see red streaks coming from the area. Your red area gets larger. Your red area turns dark in color. You have any new/concerning or worsening of  symptoms.    Please read the additional information packets attached to your discharge summary.  Do not take your medicine if  develop an itchy rash, swelling in your mouth or lips, or difficulty breathing; call 911 and seek immediate emergency medical attention if this occurs.  You may review your lab tests and imaging results in their entirety on your MyChart account.  Please discuss all results of fully with your primary care provider and other specialist at your follow-up visit.  Note: Portions of this text may have been transcribed using voice recognition software. Every effort was made to ensure accuracy; however, inadvertent computerized transcription errors may still be present.

## 2021-04-02 NOTE — ED Notes (Signed)
I rolled up a towel and placed it behind pt's neck because he's c/o neck pain. Denies further needs.

## 2021-04-02 NOTE — Telephone Encounter (Signed)
Pt daughter called regarding Rx not being attached to AVS.  RNCM called in Rx as written to pharmacy of choice ([facility]).

## 2021-04-02 NOTE — ED Notes (Signed)
Patient walked in hallway with Minimal assist, and He was a Little unsteady, But he said he feels much better. Daughter at bedside.

## 2021-04-02 NOTE — ED Notes (Signed)
Pt was given something to drink/eat, ok'd by PA.  Daughter said pt c/o a really bad HA Saturday and has been having vision problems the last week. PA notified.

## 2021-04-02 NOTE — ED Provider Notes (Addendum)
Care handoff received from [facility] still PA-C at shift change please see previous provider note for full details of visit.  In short 86 year old male presented after 2 falls, found to have cellulitis to the left lower extremity.  Patient is being treated for this as outpatient, otherwise work-up is reassuring patient is pending a DVT study of the left leg prior to discharge. Physical Exam  BP 106/60   Pulse 79   Temp 98.1 F (36.7 C) (Oral)   Resp 15   SpO2 97%   Physical Exam Constitutional:      General: He is not in acute distress.    Appearance: Normal appearance. He is well-developed. He is not ill-appearing or diaphoretic.  HENT:     Head: Normocephalic and atraumatic.  Eyes:     General: Vision grossly intact. Gaze aligned appropriately.     Pupils: Pupils are equal, round, and reactive to light.  Neck:     Trachea: Trachea and phonation normal.  Pulmonary:     Effort: Pulmonary effort is normal. No respiratory distress.  Abdominal:     General: There is no distension.     Palpations: Abdomen is soft.     Tenderness: There is no abdominal tenderness. There is no guarding or rebound.  Musculoskeletal:        General: Normal range of motion.     Cervical back: Normal range of motion.     Comments: No midline C/T/L spinal tenderness to palpation, no paraspinal muscle tenderness, no deformity, crepitus, or step-off noted.  Pelvis stable to compression bilaterally without pain.  Patient is able to pull to a seated position without assistance or difficulty.  Appropriate range of motion and strength of all major movements of the bilateral upper extremity joints.  Full range of motion appropriate strength of all major movements of the left lower extremity.  No left knee pain on exam.  Pain is not reproducible with flexion at the right hip.  Negative logroll right hip.  No pain with movement at the knee or ankles bilaterally.  Skin:    General: Skin is warm and dry.           Comments: Small skin tear with mild overlying erythema.  Neurovascular intact distally.  Neurological:     Mental Status: He is alert.     GCS: GCS eye subscore is 4. GCS verbal subscore is 5. GCS motor subscore is 6.     Comments: Speech is clear and goal oriented, follows commands Major Cranial nerves without deficit, no facial droop Moves extremities without ataxia, coordination intact  Psychiatric:        Behavior: Behavior normal.    ED Course/Procedures     Analysis shows hemoglobin, no evidence for UTI. COVID/influenza panel negative.   Lactic within normal limits is reassuring CMP shows no emergent electrolyte derangement, AKI, LFT elevations or gap. CBC within normal limits is reassuring.  CT head: IMPRESSION:  Atrophy with small vessel chronic ischemic changes of deep cerebral  white matter.     Stable calcified mass at LEFT lateral aspect of the posterior fossa  14 x 9 x 13 mm, favor calcified meningioma.     No acute intracranial abnormalities.   CT Cspine:    IMPRESSION:  Degenerative and postsurgical changes without acute abnormality.   CXR:  IMPRESSION:  1. No convincing acute cardiopulmonary or traumatic abnormality.  2. Diffusely coarsened interstitial and bronchitic changes similar  to prior and likely reflecting a combination of interstitial  disease  and emphysema seen on comparison CT.  3. Chronic pleuroparenchymal scarring bilaterally.   DG Pelvis/R Hip:  IMPRESSION:  No acute osseous abnormalities   DG Pelvis/L Hip:  IMPRESSION:  No acute osseous abnormalities.   DG Lumbar spine:  IMPRESSION:  Osseous demineralization with degenerative disc disease changes  lumbar spine.     No acute abnormalities.   LE DVT Study:  Summary:  RIGHT:  - No evidence of common femoral vein obstruction.     LEFT:  - No evidence of common femoral vein obstruction.  - There is no evidence of deep vein thrombosis in the lower extremity.     - A cystic  structure is found in the popliteal fossa.     Procedures  MDM  Additional history obtained from: Nursing notes from this visit. Review of electronic medical records.  Patient's work-up today is overall reassuring.  I reassessed the patient he is resting comfortably in bed no acute distress.  Patient informed of work-up above.  DVT study did find a Baker's cyst in the left popliteal fossa.  I cannot palpate a Baker's cyst on exam he has no pain with motion at the knee.  Patient reports that [facility], patient will be referred back to them for treatment of Baker's cyst.  Patient will follow-up with his PCP for his mild cellulitis of his left lower leg.  Patient will return to the ER for any new or worsening symptoms. - Addendum: Patient's family requesting additional pain medication for home.  I reviewed PMD P, no prior narcotics listed.  Patient reports Tylenol and ibuprofen are not helpful for his pain.  I discussed risks of opioid prescriptions with patient and his daughter and they stated understanding.  6 pills of hydrocodone were prescribed for acute musculoskeletal pain, patient lives with family members who can help watch over him while he takes his medication.  I discussed narcotic precautions with them and they stated understanding.  As far as right hip pain he has no pain with logroll, he is able to ambulate without assistance, I advised the patient use a walker to help keep weight off of the right hip until evaluation by his orthopedist - At discharge patient's daughter was concerned that the patient thought he had saw his wife walking in the ED and feels that he may be hallucinating.  On my evaluation patient is fully alert and oriented, patient has been in the ER for over 21 hours and has not had any sleep I feel this may be contributing to his symptoms.  I advised that the daughter and family members continue to observe the patient if he has  any worsening symptoms or if hallucinations do not improve they should return to the ER.  This was discussed with attending physician Dr. Roslynn Amble who agrees.   At this time there does not appear to be any evidence of an acute emergency medical condition and the patient appears stable for discharge with appropriate outpatient follow up. Diagnosis was discussed with patient who verbalizes understanding of care plan and is agreeable to discharge. I have discussed return precautions with patient who verbalizes understanding. Patient encouraged to follow-up with their PCP and Ortho. All questions answered.   Note: Portions of this report may have been transcribed using voice recognition software. Every effort was made to ensure accuracy; however, inadvertent computerized transcription errors may still be present.   Deliah Boston, PA-C 04/02/21 1028  Deliah Boston, PA-C 04/02/21 1029    Lucrezia Starch, MD 04/03/21 [phone]

## 2021-04-02 NOTE — ED Notes (Signed)
Pt in CT.

## 2021-04-03 LAB — URINE CULTURE: Culture: NO GROWTH

## 2021-04-15 ENCOUNTER — Other Ambulatory Visit: Payer: Self-pay

## 2021-04-15 ENCOUNTER — Telehealth: Payer: Self-pay | Admitting: Pharmacist

## 2021-04-15 NOTE — Chronic Care Management (AMB) (Signed)
Call to offer PT follow up appointment with [name] 05-27-21 at 1 pm patient accepted.  [facility] Clinical Pharmacist Assistant [phone]

## 2021-04-16 ENCOUNTER — Encounter: Payer: Self-pay | Admitting: Adult Health

## 2021-04-16 ENCOUNTER — Telehealth: Payer: Self-pay | Admitting: Adult Health

## 2021-04-16 ENCOUNTER — Ambulatory Visit (INDEPENDENT_AMBULATORY_CARE_PROVIDER_SITE_OTHER): Payer: Medicare Other | Admitting: Adult Health

## 2021-04-16 VITALS — BP 108/68 | HR 84 | Temp 98.2°F | Ht 66.0 in | Wt 132.0 lb

## 2021-04-16 DIAGNOSIS — F419 Anxiety disorder, unspecified: Secondary | ICD-10-CM

## 2021-04-16 DIAGNOSIS — M25551 Pain in right hip: Secondary | ICD-10-CM | POA: Diagnosis not present

## 2021-04-16 DIAGNOSIS — M545 Low back pain, unspecified: Secondary | ICD-10-CM | POA: Diagnosis not present

## 2021-04-16 DIAGNOSIS — R4189 Other symptoms and signs involving cognitive functions and awareness: Secondary | ICD-10-CM

## 2021-04-16 DIAGNOSIS — R634 Abnormal weight loss: Secondary | ICD-10-CM

## 2021-04-16 DIAGNOSIS — F32A Depression, unspecified: Secondary | ICD-10-CM

## 2021-04-16 DIAGNOSIS — M25552 Pain in left hip: Secondary | ICD-10-CM

## 2021-04-16 MED ORDER — MIRTAZAPINE 15 MG PO TABS: 15.0000 mg | ORAL_TABLET | Freq: Every day | ORAL | 0 refills | Status: DC

## 2021-04-16 NOTE — Patient Instructions (Addendum)
It was great seeing you today   I am going to keep you on Celexa and add back Remeron   Come off the Amitriptyline completely   Come off Simvastatin for the next 6 weeks   I am going to refer you to to Neurology for neuropsych testing   Please follow up in 6 weeks - please come fasting     [place] [phone]  [place]

## 2021-04-16 NOTE — Progress Notes (Signed)
Subjective:    Patient ID: Jason Farmer, male    DOB: Oct 24, 1934, 86 y.o.   MRN: [id number]  HPI 86 year old male who  has a past medical history of Anxiety, Arthritis, Chronic lower back pain, Essential tremor, GERD (gastroesophageal reflux disease), Glaucoma, Heart murmur, Hepatitis (~ 1958), HOH (hard of hearing), Hyperlipemia, Migraine, Seasonal allergies, TIA (transient ischemic attack) (04/2015), Wears glasses, and Wears hearing aid.  His daughter is with him today   He presents to the office today for 1 month follow-up regarding anxiety and depression.  During his last visit Remeron was discontinued as this did not seem to be working any longer.Marland Kitchen  He felt as though his anxiety was major problem, he was feeling very anxious and unable to turn off his mind at night.  Thought that his anxiety was causing a lot of his issues.  We placed him on Celexa 10 mg.  Additionally he was seen in the emergency room on 04/01/2021 for the complaint of bilateral hip pain and low back pain after falling earlier that evening.  He was sleeping in his bedroom and got disoriented when he got up to go to the restroom.Marland Kitchen  He ran into the dresser he forgot was there and fell to the ground.  CT of cervical spine, chest x-ray, CT head, x-ray of lumbar spine and bilateral hips were negative for acute injury.  He was diagnosed with cellulitis of left lower leg, was given IV antibiotics and sent home on a course of Keflex.  Today he reports that he continues to low back and bilateral hip pain, this seems to be improving to some degree.  Has finished his antibiotic therapy and the cellulitis has resolved.  He does report that he feels like his anxiety is somewhat better controlled and he is sleeping a little better but continues to have stress dealing with his wife.  His weight is also down since coming off Remeron as his appetite has decreased.  He has lost about 6 pounds.  It was noticed that during his exam today  that he was having some issues with cognitive impairment, especially with short-term memory loss.  He does not recall much about his recent ER visit his daughter is also noticed that his short-term memory has been getting a little worse as time goes on.  He has had noted age-related atrophy and CTs and MRIs in the past.  Also reports chronic fatigue and "foggy headed feeling".  He is taking Elavil 25 mg QHS  Wt Readings from Last 3 Encounters:  04/16/21 132 lb (59.9 kg)  03/13/21 138 lb (62.6 kg)  12/25/20 137 lb 3.2 oz (62.2 kg)    Review of Systems See HPI   Past Medical History:  Diagnosis Date   Anxiety    "not a problem for a long time" (11/27/2016)   Arthritis    "wrists, knees, shoulders, back" (11/27/2016)   Chronic lower back pain    "5th disc is protruding" (11/27/2016)   Essential tremor    GERD (gastroesophageal reflux disease)    Glaucoma    Heart murmur    dx'd in [employer] in the 1960s; haven't been seen for it since "(11/27/2016)   Hepatitis ~ 1958   "jaundice kind"   HOH (hard of hearing)    Hyperlipemia    Migraine    "none since my neck OR" (11/27/2016)   Seasonal allergies    TIA (transient ischemic attack) 04/2015   Wears glasses  Wears hearing aid     Social History   Socioeconomic History   Marital status: Married    Spouse name: Not on file   Number of children: 4   Years of education: Not on file   Highest education level: Not on file  Occupational History   Not on file  Tobacco Use   Smoking status: Former    Packs/day: 2.00    Years: 30.00    Pack years: 60.00    Types: Cigarettes    Quit date: 06/03/1984    Years since quitting: 36.8   Smokeless tobacco: Never   Tobacco comments:    had AAA at the [place]; also noted 2018 on Dr. Kyla Balzarine note   Substance and Sexual Activity   Alcohol use: No    Comment: 11/27/2016 "quit in 1985"   Drug use: No   Sexual activity: Not Currently  Other Topics Concern   Not on file  Social History Narrative    Retired from [employer]   Married    4 children, one son lives locally   Social Determinants of Health   Financial Resource Strain: Low Risk    Difficulty of Paying Living Expenses: Not hard at all  Food Insecurity: No Food Insecurity   Worried About Charity fundraiser in the Last Year: Never true   Arboriculturist in the Last Year: Never true  Transportation Needs: No Transportation Needs   Lack of Transportation (Medical): No   Lack of Transportation (Non-Medical): No  Physical Activity: Sufficiently Active   Days of Exercise per Week: 5 days   Minutes of Exercise per Session: 60 min  Stress: Stress Concern Present   Feeling of Stress : To some extent  Social Connections: Moderately Isolated   Frequency of Communication with Friends and Family: Twice a week   Frequency of Social Gatherings with Friends and Family: Once a week   Attends Religious Services: Never   Marine scientist or Organizations: No   Attends Music therapist: Never   Marital Status: Married  Human resources officer Violence: Not At Risk   Fear of Current or Ex-Partner: No   Emotionally Abused: No   Physically Abused: No   Sexually Abused: No    Past Surgical History:  Procedure Laterality Date   ANTERIOR CERVICAL DECOMP/DISCECTOMY FUSION  2007   APPENDECTOMY     BACK SURGERY     CARPAL TUNNEL RELEASE Right 10/12/2013   Procedure: RIGHT CARPAL TUNNEL RELEASE;  Surgeon: Wynonia Sours, MD;  Location: [facility];  Service: Orthopedics;  Laterality: Right;   CATARACT EXTRACTION W/ INTRAOCULAR LENS  IMPLANT, BILATERAL Bilateral    COLONOSCOPY     HEMORRHOID SURGERY     KNEE ARTHROSCOPY Left 1970s   NASAL SEPTOPLASTY W/ TURBINOPLASTY Bilateral 02/15/2013   Procedure: NASAL SEPTOPLASTY WITH BILATER TURBINATE REDUCTION;  Surgeon: Ascencion Dike, MD;  Location: [facility];  Service: ENT;  Laterality: Bilateral;   TONSILLECTOMY     TRIGGER FINGER RELEASE  06/08/2012    Procedure: RELEASE TRIGGER FINGER/A-1 PULLEY;  Surgeon: Wynonia Sours, MD;  Location: [facility];  Service: Orthopedics;  Laterality: Left;  Release A-1 Pulley Left Long Finger   TRIGGER FINGER RELEASE  07/14/2012   Procedure: RELEASE TRIGGER FINGER/A-1 PULLEY;  Surgeon: Wynonia Sours, MD;  [facility];  Service: Orthopedics;  Laterality: Right;   TRIGGER FINGER RELEASE Right 10/12/2013   Procedure: RELEASE TRIGGER  FINGER/A-1 PULLEY RIGHT MIDDLE FINGER;  Surgeon: Wynonia Sours, MD;  Location: [facility];  Service: Orthopedics;  Laterality: Right;   UPPER GASTROINTESTINAL ENDOSCOPY      Family History  Problem Relation Age of Onset   Dementia Father    Heart failure Father    Glaucoma Mother    Colon cancer Neg Hx     Allergies  Allergen Reactions   Statins Other (See Comments)    Muscle cramps    Aspirin Other (See Comments)    bleeding    Current Outpatient Medications on File Prior to Visit  Medication Sig Dispense Refill   acetaminophen (TYLENOL) 325 MG tablet Take 325 mg by mouth every 6 (six) hours as needed for moderate pain or headache.     atenolol (TENORMIN) 25 MG tablet TAKE (1/2) TABLET DAILY. (Patient taking differently: Take 12.5 mg by mouth daily.) 45 tablet 3   azelastine (ASTELIN) 0.1 % nasal spray Place 2 sprays into both nostrils at bedtime. Use in each nostril as directed     Cholecalciferol (VITAMIN D3) 125 MCG (5000 UT) CAPS Take 5,000 Units by mouth daily.     escitalopram (LEXAPRO) 10 MG tablet Take 1 tablet (10 mg total) by mouth daily. 90 tablet 0   ezetimibe (ZETIA) 10 MG tablet TAKE 1 TABLET EACH DAY. (Patient taking differently: Take 10 mg by mouth daily.) 90 tablet 0   latanoprost (XALATAN) 0.005 % ophthalmic solution Place 1 drop into both eyes at bedtime.      omeprazole (PRILOSEC) 20 MG capsule TAKE ONE CAPSULE BY MOUTH DAILY (Patient taking differently: Take 20 mg by mouth daily.) 90 capsule 0    simvastatin (ZOCOR) 20 MG tablet Take 1 tablet (20 mg total) by mouth every other day. (Patient taking differently: Take 10 mg by mouth at bedtime.) 45 tablet 3   No current facility-administered medications on file prior to visit.    BP 108/68   Pulse 84   Temp 98.2 F (36.8 C) (Oral)   Ht '5\' 6"'$  (1.676 m)   Wt 132 lb (59.9 kg)   SpO2 98%   BMI 21.31 kg/m       Objective:   Physical Exam Vitals and nursing note reviewed.  Constitutional:      Appearance: Normal appearance. He is underweight.  Cardiovascular:     Rate and Rhythm: Normal rate and regular rhythm.     Pulses: Normal pulses.     Heart sounds: Normal heart sounds.  Pulmonary:     Effort: Pulmonary effort is normal.     Breath sounds: Normal breath sounds.  Musculoskeletal:        General: Normal range of motion.     Cervical back: Tenderness present. No bony tenderness. Pain with movement present. Normal range of motion.       Back:     Right hip: Normal. No bony tenderness. Normal range of motion. Normal strength.     Left hip: Normal. No bony tenderness. Normal range of motion. Normal strength.  Skin:    General: Skin is warm and dry.  Neurological:     General: No focal deficit present.     Mental Status: He is alert and oriented to person, place, and time.      Assessment & Plan:  1. Anxiety and depression - Will add back remeron to his regimen with Celexa as there has been some improvement  - mirtazapine (REMERON) 15 MG tablet; Take 1 tablet (15 mg total)  by mouth at bedtime.  Dispense: 90 tablet; Refill: 0  2. Weight loss - Place back on Remeron  - mirtazapine (REMERON) 15 MG tablet; Take 1 tablet (15 mg total) by mouth at bedtime.  Dispense: 90 tablet; Refill: 0  3. Cognitive decline - Mild short term cognitive decline noted today  - D/c Elavil and have him stop Simvastatin to see if that is partial cause - Follow up in 6 weeks  - Ambulatory referral to Neurology  4. Acute bilateral low back  pain without sciatica - Reviewed xrays - Continue with stretching exercises - Warm compress - Can use Voltaren gel  - Ambulatory referral to Physical Therapy  5. Bilateral hip pain - Reviewed xrays - Continue with stretching exercises - Warm compress  - Ambulatory referral to Physical Therapy  Dorothyann Peng, NP  Time spent on chart review, time with patient; discussion of muscular pain, anxiety, and cognitive impairment,  home monitoring  treatment, follow up plan, and documentation 30 minutes

## 2021-04-17 ENCOUNTER — Encounter: Payer: Self-pay | Admitting: Physician Assistant

## 2021-05-09 ENCOUNTER — Ambulatory Visit (INDEPENDENT_AMBULATORY_CARE_PROVIDER_SITE_OTHER): Payer: Medicare Other | Admitting: Physician Assistant

## 2021-05-09 ENCOUNTER — Other Ambulatory Visit: Payer: Self-pay

## 2021-05-09 ENCOUNTER — Encounter: Payer: Self-pay | Admitting: Physician Assistant

## 2021-05-09 VITALS — HR 81 | Resp 20 | Ht 68.0 in | Wt 135.0 lb

## 2021-05-09 DIAGNOSIS — G3184 Mild cognitive impairment, so stated: Secondary | ICD-10-CM

## 2021-05-09 MED ORDER — DONEPEZIL HCL 10 MG PO TABS: 10.0000 mg | ORAL_TABLET | Freq: Every day | ORAL | 3 refills | Status: DC

## 2021-05-09 NOTE — Patient Instructions (Addendum)
It was a pleasure to see you today at our office.   Recommendations:  Start Donepezil 10 mg : Take half tablet (5 mg) daily for 2 weeks, then increase to the full tablet at 10 mg daily   Follow up once the results of the above are available   RECOMMENDATIONS FOR ALL PATIENTS WITH MEMORY PROBLEMS: 1. Continue to exercise (Recommend 30 minutes of walking everyday, or 3 hours every week) 2. Increase social interactions - continue going to [place] and enjoy social gatherings with friends and family 3. Eat healthy, avoid fried foods and eat more fruits and vegetables 4. Maintain adequate blood pressure, blood sugar, and blood cholesterol level. Reducing the risk of stroke and cardiovascular disease also helps promoting better memory. 5. Avoid stressful situations. Live a simple life and avoid aggravations. Organize your time and prepare for the next day in anticipation. 6. Sleep well, avoid any interruptions of sleep and avoid any distractions in the bedroom that may interfere with adequate sleep quality 7. Avoid sugar, avoid sweets as there is a strong link between excessive sugar intake, diabetes, and cognitive impairment We discussed the Mediterranean diet, which has been shown to help patients reduce the risk of progressive memory disorders and reduces cardiovascular risk. This includes eating fish, eat fruits and green leafy vegetables, nuts like almonds and hazelnuts, walnuts, and also use olive oil. Avoid fast foods and fried foods as much as possible. Avoid sweets and sugar as sugar use has been linked to worsening of memory function.  There is always a concern of gradual progression of memory problems. If this is the case, then we may need to adjust level of care according to patient needs. Support, both to the patient and caregiver, should then be put into place.     FALL PRECAUTIONS: Be cautious when walking. Scan the area for obstacles that may increase the risk of trips and falls. When  getting up in the mornings, sit up at the edge of the bed for a few minutes before getting out of bed. Consider elevating the bed at the head end to avoid drop of blood pressure when getting up. Walk always in a well-lit room (use night lights in the walls). Avoid area rugs or power cords from appliances in the middle of the walkways. Use a walker or a cane if necessary and consider physical therapy for balance exercise. Get your eyesight checked regularly.  FINANCIAL OVERSIGHT: Supervision, especially oversight when making financial decisions or transactions is also recommended.  HOME SAFETY: Consider the safety of the kitchen when operating appliances like stoves, microwave oven, and blender. Consider having supervision and share cooking responsibilities until no longer able to participate in those. Accidents with firearms and other hazards in the house should be identified and addressed as well.   ABILITY TO BE LEFT ALONE: If patient is unable to contact 911 operator, consider using LifeLine, or when the need is there, arrange for someone to stay with patients. Smoking is a fire hazard, consider supervision or cessation. Risk of wandering should be assessed by caregiver and if detected at any point, supervision and safe proof recommendations should be instituted.  MEDICATION SUPERVISION: Inability to self-administer medication needs to be constantly addressed. Implement a mechanism to ensure safe administration of the medications.   DRIVING: Regarding driving, in patients with progressive memory problems, driving will be impaired. We advise to have someone else do the driving if trouble finding directions or if minor accidents are reported. Independent driving assessment is  available to determine safety of driving.   If you are interested in the driving assessment, you can contact the following:  [facility] [employer] [facility] in [place]  [place]  [place] [phone] or [phone]    [place] refers to food and lifestyle choices that are based on the traditions of countries located on the [employer]. This way of eating has been shown to help prevent certain conditions and improve outcomes for people who have chronic diseases, like kidney disease and heart disease. What are tips for following this plan? Lifestyle  Cook and eat meals together with your family, when possible. Drink enough fluid to keep your urine clear or pale yellow. Be physically active every day. This includes: Aerobic exercise like running or swimming. Leisure activities like gardening, walking, or housework. Get 7-8 hours of sleep each night. If recommended by your health care provider, drink red wine in moderation. This means 1 glass a day for nonpregnant women and 2 glasses a day for men. A glass of wine equals 5 oz (150 mL). Reading food labels  Check the serving size of packaged foods. For foods such as rice and pasta, the serving size refers to the amount of cooked product, not dry. Check the total fat in packaged foods. Avoid foods that have saturated fat or trans fats. Check the ingredients list for added sugars, such as corn syrup. Shopping  At the grocery store, buy most of your food from the areas near the walls of the store. This includes: Fresh fruits and vegetables (produce). Grains, beans, nuts, and seeds. Some of these may be available in unpackaged forms or large amounts (in bulk). Fresh seafood. Poultry and eggs. Low-fat dairy products. Buy whole ingredients instead of prepackaged foods. Buy fresh fruits and vegetables in-season from local farmers markets. Buy frozen fruits and vegetables in resealable bags. If you do not have access to quality fresh seafood, buy precooked frozen shrimp or canned fish, such as tuna, salmon, or sardines. Buy  small amounts of raw or cooked vegetables, salads, or olives from the deli or salad bar at your store. Stock your pantry so you always have certain foods on hand, such as olive oil, canned tuna, canned tomatoes, rice, pasta, and beans. Cooking  Cook foods with extra-virgin olive oil instead of using butter or other vegetable oils. Have meat as a side dish, and have vegetables or grains as your main dish. This means having meat in small portions or adding small amounts of meat to foods like pasta or stew. Use beans or vegetables instead of meat in common dishes like chili or lasagna. Experiment with different cooking methods. Try roasting or broiling vegetables instead of steaming or sauteing them. Add frozen vegetables to soups, stews, pasta, or rice. Add nuts or seeds for added healthy fat at each meal. You can add these to yogurt, salads, or vegetable dishes. Marinate fish or vegetables using olive oil, lemon juice, garlic, and fresh herbs. Meal planning  Plan to eat 1 vegetarian meal one day each week. Try to work up to 2 vegetarian meals, if possible. Eat seafood 2 or more times a week. Have healthy snacks readily available, such as: Vegetable sticks with hummus. Greek yogurt. Fruit and nut trail mix. Eat balanced meals throughout the week. This includes: Fruit: 2-3 servings a day Vegetables: 4-5 servings a day Low-fat dairy: 2 servings a day Fish, poultry, or lean meat: 1 serving  a day Beans and legumes: 2 or more servings a week Nuts and seeds: 1-2 servings a day Whole grains: 6-8 servings a day Extra-virgin olive oil: 3-4 servings a day Limit red meat and sweets to only a few servings a month What are my food choices? Mediterranean diet Recommended Grains: Whole-grain pasta. Brown rice. Bulgar wheat. Polenta. Couscous. Whole-wheat bread. [place]. Vegetables: Artichokes. Beets. Broccoli. Cabbage. Carrots. Eggplant. Green beans. Chard. Kale. Spinach. Onions. Leeks. Peas.  Squash. Tomatoes. Peppers. Radishes. Fruits: Apples. Apricots. Avocado. Berries. Bananas. Cherries. Dates. Figs. Grapes. Lemons. Melon. Oranges. Peaches. Plums. Pomegranate. Meats and other protein foods: Beans. Almonds. Sunflower seeds. Pine nuts. Peanuts. [place]. Salmon. Scallops. Shrimp. [place]. Tilapia. Clams. Oysters. Eggs. Dairy: Low-fat milk. Cheese. Greek yogurt. Beverages: Water. Red wine. Herbal tea. Fats and oils: Extra virgin olive oil. Avocado oil. Grape seed oil. Sweets and desserts: [place] yogurt with honey. Baked apples. Poached pears. Trail mix. Seasoning and other foods: Basil. Cilantro. Coriander. Cumin. Mint. Parsley. Sage. Rosemary. Tarragon. Garlic. Oregano. Thyme. Pepper. Balsalmic vinegar. Tahini. Hummus. Tomato sauce. Olives. Mushrooms. Limit these Grains: Prepackaged pasta or rice dishes. Prepackaged cereal with added sugar. Vegetables: Deep fried potatoes (french fries). Fruits: Fruit canned in syrup. Meats and other protein foods: Beef. Pork. Lamb. Poultry with skin. Hot dogs. Berniece Salines. Dairy: Ice cream. Sour cream. Whole milk. Beverages: Juice. Sugar-sweetened soft drinks. Beer. Liquor and spirits. Fats and oils: Butter. [place]ola oil. Vegetable oil. Beef fat (tallow). Lard. Sweets and desserts: Cookies. Cakes. Pies. Candy. Seasoning and other foods: Mayonnaise. Premade sauces and marinades. The items listed may not be a complete list. Talk with your dietitian about what dietary choices are right for you. Summary The Mediterranean diet includes both food and lifestyle choices. Eat a variety of fresh fruits and vegetables, beans, nuts, seeds, and whole grains. Limit the amount of red meat and sweets that you eat. Talk with your health care provider about whether it is safe for you to drink red wine in moderation. This means 1 glass a day for nonpregnant women and 2 glasses a day for men. A glass of wine equals 5 oz (150 mL). This information is not intended to replace advice  given to you by your health care provider. Make sure you discuss any questions you have with your health care provider. Document Released: 04/03/2016 Document Revised: 05/06/2016 Document Reviewed: 04/03/2016 Elsevier Interactive Patient Education  2017 [employer].
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
## 2021-05-17 ENCOUNTER — Encounter: Payer: Self-pay | Admitting: Physician Assistant

## 2021-05-17 NOTE — Telephone Encounter (Signed)
Left message to contact office on answer maching at 3:05 05/17/2021

## 2021-05-17 NOTE — Telephone Encounter (Signed)
The patient called back in stating someone had called him. His contact number is [phone]

## 2021-05-20 NOTE — Telephone Encounter (Signed)
Left message to call office aback 05/20/2021 at 9:26am

## 2021-05-24 ENCOUNTER — Telehealth: Payer: Self-pay | Admitting: Pharmacist

## 2021-05-24 NOTE — Chronic Care Management (AMB) (Signed)
    Chronic Care Management Pharmacy Assistant   Name: KASHAWN DIRR  MRN: [id number] DOB: 1935-08-20  05/27/2021 APPOINTMENT REMINDER   Called [name], No answer, left message of appointment on 05/27/2021 at 1:00 via telephone visit with [name], Pharm D. Notified to have all medications, supplements, blood pressure and/or blood sugar logs available during appointment and to return call if need to reschedule.   Care Gaps: AWV - scheduled 12/31/2021 Zoster vaccine - never done Covid 19 vaccine booster 4 - overdue Flu vaccine - due  Star Rating Drug: Simvastatin 20mg  last filled 04/11/2021 90DS at [facility]  Any gaps in medications fill history? No    [place] [phone]

## 2021-05-27 ENCOUNTER — Ambulatory Visit (INDEPENDENT_AMBULATORY_CARE_PROVIDER_SITE_OTHER): Payer: Medicare Other | Admitting: Pharmacist

## 2021-05-27 DIAGNOSIS — F322 Major depressive disorder, single episode, severe without psychotic features: Secondary | ICD-10-CM

## 2021-05-27 DIAGNOSIS — I10 Essential (primary) hypertension: Secondary | ICD-10-CM

## 2021-05-27 NOTE — Progress Notes (Signed)
Chronic Care Management Pharmacy Note  06/07/2021 Name:  Jason Farmer MRN:  [id number] DOB:  Nov 23, 1934  Summary: Patient is unsure of what medications he is taking as his wife manages them BP is at goal < 140/90 per office readings Patient is feeling better since stopping donepezil  Recommendations/Changes made from today's visit: -Recommended routine monitoring of BP at home -Consider referral for a different neurologist -Recommend repeat lipid panel -Consider [facility] for packaging benefit  Plan: Follow up in 6 months   Subjective: Jason Farmer is an 86 y.o. year old male who is a primary patient of Dorothyann Peng, NP.  The CCM team was consulted for assistance with disease management and care coordination needs.    Engaged with patient by telephone for follow up visit in response to provider referral for pharmacy case management and/or care coordination services.   Consent to Services:  The patient was given information about Chronic Care Management services, agreed to services, and gave verbal consent prior to initiation of services.  Please see initial visit note for detailed documentation.   Patient Care Team: Dorothyann Peng, NP as PCP - General (Family Medicine) Rozetta Nunnery, MD as Consulting Physician (Otolaryngology) [facility] as Consulting Physician (Orthopedic Surgery) Viona Gilmore, [facility] as Pharmacist (Pharmacist)  Recent office visits: 03/27/21 Dorothyann Peng, NP: Patient presented for anxiety and depression follow up. Stopped amitriptyline and simvastatin. Placed referral to neurology. Prescribed mirtazapine for weight loss. Follow up in 6 weeks.  03-13-2021 Dorothyann Peng, NP - Patient presented for Anxiety and depression and other concerns. Prescribed Escitalopram 10 mg daily. Stopped Loratadine and Mirtazapine and Montelukast Sodium.  12/25/20 Charlott Rakes, LPN: Patient presented for AWV.  Recent consult visits: 05/17/21 Patient  message (neurology): Recommended holding donepezil due to diarrhea and try again in 1-2 weeks.  05/09/21 Sharene Butters, PA-C (neurology): Patient presented for follow up for mild neurocognitive disorder. Prescribed donepezil 1/2 tablet daily x 2 weeks then increase to 1 full tablet at 10 mg daily.  Hospital visits: Medication Reconciliation was completed by comparing discharge summary, patient's EMR and Pharmacy list, and upon discussion with patient.   Patient [facility] ED on 04-01-2021 due to Cellulitis of left leg. He was there for 21 hours.   New?Medications Started at [facility] Discharge:?? -started  Keflex 500 mg - Take 1 capsule  by mouth 4 (four) times daily for 7 days. NORCO/VICODIN 5-325 MG-Take 1 tablet by mouth every 6 (six) hours as needed.   Medication Changes at Hospital Discharge: -Changed  None   Medications Discontinued at Hospital Discharge: -Stopped  None   Medications that remain the same after Hospital Discharge:??  -All other medications will remain the same.    Objective:  Lab Results  Component Value Date   CREATININE 1.19 04/02/2021   BUN 15 04/02/2021   GFR 49.02 (L) 09/26/2020   GFRNONAA >60 04/02/2021   GFRAA 51 (L) 03/16/2020   NA 135 04/02/2021   K 3.7 04/02/2021   CALCIUM 9.0 04/02/2021   CO2 23 04/02/2021    Lab Results  Component Value Date/Time   GFR 49.02 (L) 09/26/2020 10:47 AM   GFR 50.99 (L) 08/30/2019 08:26 AM    Last diabetic Eye exam: No results found for: HMDIABEYEEXA  Last diabetic Foot exam: No results found for: HMDIABFOOTEX   Lab Results  Component Value Date   CHOL 285 (H) 06/05/2021   HDL 56.90 06/05/2021   LDLCALC 207 (H) 06/05/2021   LDLDIRECT  139.5 09/01/2013   TRIG 108.0 06/05/2021   CHOLHDL 5 06/05/2021    Hepatic Function Latest Ref Rng & Units 04/02/2021 09/26/2020 03/16/2020  Total Protein 6.5 - 8.1 g/dL 6.1(L) 6.6 6.0(L)  Albumin 3.5 - 5.0 g/dL 3.3(L) 4.1 -  AST 15 - 41 U/L 19  21 12   ALT 0 - 44 U/L 14 18 12   Alk Phosphatase 38 - 126 U/L 45 59 -  Total Bilirubin 0.3 - 1.2 mg/dL 0.7 0.5 0.3  Bilirubin, Direct 0.0 - 0.3 mg/dL - - -    Lab Results  Component Value Date/Time   TSH 1.40 09/26/2020 10:47 AM   TSH 1.63 08/30/2019 08:26 AM    CBC Latest Ref Rng & Units 04/02/2021 09/26/2020 03/16/2020  WBC 4.0 - 10.5 K/uL 8.3 6.2 7.5  Hemoglobin 13.0 - 17.0 g/dL 13.7 14.7 14.3  Hematocrit 39.0 - 52.0 % 41.1 43.8 43.2  Platelets 150 - 400 K/uL 229 266.0 318    Lab Results  Component Value Date/Time   VD25OH 27.55 (L) 09/26/2020 10:47 AM   VD25OH 20.33 (L) 02/09/2018 01:33 PM    Clinical ASCVD: No  The ASCVD Risk score (Arnett DK, et al., 2019) failed to calculate for the following reasons:   The 2019 ASCVD risk score is only valid for ages 65 to 39    Depression screen PHQ 2/9 03/13/2021 12/25/2020 09/26/2020  Decreased Interest 0 0 0  Down, Depressed, Hopeless 1 1 0  PHQ - 2 Score 1 1 0  Altered sleeping 2 - -  Tired, decreased energy 0 - -  Change in appetite 3 - -  Feeling bad or failure about yourself  1 - -  Trouble concentrating 1 - -  Moving slowly or fidgety/restless 1 - -  Suicidal thoughts 0 - -  PHQ-9 Score 9 - -  Difficult doing work/chores Not difficult at all - -      Social History   Tobacco Use  Smoking Status Former   Packs/day: 2.00   Years: 30.00   Pack years: 60.00   Types: Cigarettes   Quit date: 06/03/1984   Years since quitting: 37.0  Smokeless Tobacco Never  Tobacco Comments   had AAA at the [place]; also noted 2018 on Dr. Kyla Balzarine note    BP Readings from Last 3 Encounters:  06/05/21 120/70  04/16/21 108/68  04/02/21 129/61   Pulse Readings from Last 3 Encounters:  06/05/21 90  05/09/21 81  04/16/21 84   Wt Readings from Last 3 Encounters:  06/05/21 131 lb (59.4 kg)  05/09/21 135 lb (61.2 kg)  04/16/21 132 lb (59.9 kg)    Assessment/Interventions: Review of patient past medical history, allergies, medications,  health status, including review of consultants reports, laboratory and other test data, was performed as part of comprehensive evaluation and provision of chronic care management services.   SDOH:  (Social Determinants of Health) assessments and interventions performed: No   CCM Care Plan  Allergies  Allergen Reactions   Statins Other (See Comments)    Muscle cramps    Aspirin Other (See Comments)    bleeding    Medications Reviewed Today     Reviewed by Franco Collet, CMA (Certified Medical Assistant) on 06/05/21 at 0857  Med List Status: <None>   Medication Order Taking? Sig Documenting Provider Last Dose Status Informant  acetaminophen (TYLENOL) 325 MG tablet [id number]  Take 325 mg by mouth every 6 (six) hours as needed for moderate pain or headache. [provider]  Active Multiple Informants  atenolol (TENORMIN) 25 MG tablet [id number]  TAKE (1/2) TABLET DAILY.  Patient taking differently: Take 12.5 mg by mouth daily.   Nafziger, Tommi Rumps, NP  Active   azelastine (ASTELIN) 0.1 % nasal spray [id number]  Place 2 sprays into both nostrils at bedtime. Use in each nostril as directed [provider]  Active Multiple Informants  Cholecalciferol (VITAMIN D3) 125 MCG (5000 UT) CAPS [id number]  Take 5,000 Units by mouth daily. [provider]  Active Multiple Informants  donepezil (ARICEPT) 10 MG tablet [id number]  Take 1 tablet (10 mg total) by mouth at bedtime. Rondel Jumbo, PA-C  Active   escitalopram (LEXAPRO) 10 MG tablet [id number]  TAKE ONE TABLET BY MOUTH DAILY Nafziger, Tommi Rumps, NP  Active   ezetimibe (ZETIA) 10 MG tablet [id number]  TAKE 1 TABLET EACH DAY.  Patient taking differently: Take 10 mg by mouth daily.   Nafziger, Tommi Rumps, NP  Active   latanoprost (XALATAN) 0.005 % ophthalmic solution [id number]  Place 1 drop into both eyes at bedtime.  [provider]  Active Multiple Informants  mirtazapine (REMERON) 15 MG tablet [id number]  Take 1 tablet (15  mg total) by mouth at bedtime. Nafziger, Tommi Rumps, NP  Active   omeprazole (PRILOSEC) 20 MG capsule [id number]  TAKE ONE CAPSULE BY MOUTH DAILY  Patient taking differently: Take 20 mg by mouth daily.   Nafziger, Tommi Rumps, NP  Active   simvastatin (ZOCOR) 20 MG tablet [id number]  Take 1 tablet (20 mg total) by mouth every other day.  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    Immunization History  Administered Date(s) Administered   Influenza Split 09/03/2011   Influenza Whole 08/01/2010   Influenza, High Dose Seasonal PF 06/14/2013, 06/13/2015, 05/08/2016, 05/20/2017, 06/01/2017, 06/07/2018, 06/25/2018, 05/25/2020   Influenza, Seasonal, Injecte, Preservative Fre 09/06/2012   Influenza,inj,Quad PF,6+ Mos 05/29/2014   Influenza-Unspecified 05/25/2005, 09/09/2006, 06/02/2007, 05/31/2008, 08/29/2008, 09/08/2009, 07/25/2010, 09/03/2011, 08/25/2012, 06/25/2013, 06/25/2014, 06/13/2015, 04/25/2016   Moderna SARS-COV2 Booster Vaccination 04/14/2020   PFIZER(Purple Top)SARS-COV-2 Vaccination 09/14/2019, 10/05/2019   Pneumococcal Conjugate-13 08/25/2013, 09/08/2013, 05/25/2017   Pneumococcal Polysaccharide-23 08/25/2005, 07/25/2009, 06/25/2018   Tdap 10/01/2011, 08/11/2018   Zoster, Live 09/06/2008   Patient has been sleeping extremely well lately and sometimes has trouble falling asleep but not always. Patient has been sleeping for 12 hours some nights. Patient doesn't  wake up so tired in the morning and he was exhausted.   Patient has noticed that he is eating more than he was before. Patient has trouble with his taste buds and couldn't really taste anything. He can taste more than he could.  Patient does not check his BP often but the bottom number is around the 60s. Patient's daughter is planning to get a new machine. Recommended bringing BP cuff if he purchases it before his visit.  Conditions to be addressed/monitored:  Hypertension, Hyperlipidemia, GERD, Depression, Allergic Rhinitis and ocular  migraine, glaucoma, and vitamin D deficiency  Conditions addressed this visit: Insomnia, depression, hypertension  Care Plan : CCM Pharmacy Care Plan  Updates made by Viona Gilmore, [place] since 06/07/2021 12:00 AM     Problem: Problem: Hypertension, Hyperlipidemia, GERD, Depression, Allergic Rhinitis and ocular migraine, glaucoma, and vitamin D deficiency      Long-Range Goal: Patient-Specific Goal   Start Date: 10/15/2020  Expected End Date: 10/15/2021  Recent Progress: On track  Priority: High  Note:   Current Barriers:  Unable to achieve control of cholesterol  Unable to self administer medications as prescribed  Pharmacist Clinical Goal(s):  Patient will achieve adherence to monitoring guidelines and medication adherence to achieve therapeutic efficacy achieve control of cholesterol as evidenced by next lipid panel  through collaboration with PharmD and provider.   Interventions: 1:1 collaboration with Dorothyann Peng, NP regarding development and update of comprehensive plan of care as evidenced by provider attestation and co-signature Inter-disciplinary care team collaboration (see longitudinal plan of care) Comprehensive medication review performed; medication list updated in electronic medical record  Hypertension (BP goal <140/90) -Controlled -Current treatment: Atenolol 25 mg 1/2 tablet daily -Medications previously tried:  none -Current home readings: could not provide -Current dietary habits: has cut back on salt a lot and does not eat canned foods; he does admit to looking at package labels for sodium content -Current exercise habits: walks 3-4 miles a day at two different times of the day -Denies hypotensive/hypertensive symptoms -Educated on Importance of home blood pressure monitoring; -Counseled to monitor BP at home weekly, document, and provide log at future appointments -Recommended to continue current medication  Hyperlipidemia: (LDL goal < 70) -Uncontrolled -Current treatment: Ezetimibe 10 mg 1 tablet daily -Medications previously tried:  statins (muscle cramps) -Current dietary patterns: patient eats more chicken than red meat and does not drink alcohol -Current exercise habits:  walks 3-4 miles a day at two different times of the day -Educated on Cholesterol goals;  Benefits of statin for ASCVD risk reduction; -Recommended to continue current medication  Depression/Insomnia (Goal: minimize symptoms of depression and improve quality and quantity of sleep) -Controlled -Current treatment: Mirtazapine 15 mg 1 tablet at bedtime Escitalopram 10 mg 1 tablet daily -Medications previously tried/failed: n/a -PHQ9: 10 -Educated on Benefits of medication for symptom control -Recommended to continue current medication  Ocular migraine (Goal: minimize symptoms) -Controlled -Current treatment  None -Medications previously tried: amitriptyline (cognitive impairment) -Recommended to continue current medication  GERD/dysphagia (Goal: minimize symptoms of heartburn or acid reflux) -Controlled -Current treatment  Omeprazole 20 mg 1 capsule daily -Medications previously tried: none  -Recommended to continue current medication Recommended ENT referral  Vitamin D deficiency (Goal: vitamin D 30-100) -Uncontrolled -Current treatment  Vitamin D 5000 units 1 capsule daily -Medications previously  tried: none  -Recommended to continue current medication Counseled on drinking plenty of water  Allergic rhinitis (Goal: minimize symptoms of allergies) -Controlled -Current treatment  Atrovent nasal spray PRN Claritin 10 mg 1 tablet daily    -Medications previously tried: montelukast (cognitive impairment) -Recommended to continue current medication  Glaucoma (Goal: lower intraocular pressure) -Controlled -Current treatment  Latanoprost 0.005% 1 drop in both eyes at bedtime -Medications previously tried: none  -Recommended to continue current medication  Health Maintenance -Vaccine gaps: Shingrix and tetanus -Current therapy:  Benefiber daily -Educated on Cost vs benefit of each product must be carefully weighed by individual consumer -Patient is satisfied with current therapy and denies issues -Recommended to continue current medication  Patient Goals/Self-Care  Activities Patient will:  - take medications as prescribed check blood pressure weekly, document, and provide at future appointments  Follow Up Plan: Telephone follow up appointment with care management team member scheduled for: 6 months       Medication Assistance: None required.  Patient affirms current coverage meets needs.  Compliance/Adherence/Medication fill history: Care Gaps: Shingrix, COVID booster  Star-Rating Drugs: Simvastatin 62m [facility] [id number]: [phone]: [phone] Uses pill box? Yes - daughter manages Adherence is unclear  We discussed: Benefits of medication synchronization, packaging and delivery as well as enhanced pharmacist oversight with [facility]. Patient decided to: Continue current medication management strategy  Care Plan and Follow Up Patient Decision:  Patient agrees to Care  Plan and Follow-up.  Plan: Telephone follow up appointment with care management team member scheduled for:  6 months  MJeni Salles PharmD BLake WyliePharmacist LWeekapaugat [phone]

## 2021-05-29 ENCOUNTER — Ambulatory Visit: Payer: Medicare Other | Admitting: Adult Health

## 2021-05-29 DIAGNOSIS — M25811 Other specified joint disorders, right shoulder: Secondary | ICD-10-CM | POA: Diagnosis not present

## 2021-05-29 DIAGNOSIS — M25812 Other specified joint disorders, left shoulder: Secondary | ICD-10-CM | POA: Diagnosis not present

## 2021-06-04 ENCOUNTER — Other Ambulatory Visit: Payer: Self-pay | Admitting: Adult Health

## 2021-06-05 ENCOUNTER — Encounter: Payer: Self-pay | Admitting: Adult Health

## 2021-06-05 ENCOUNTER — Ambulatory Visit (INDEPENDENT_AMBULATORY_CARE_PROVIDER_SITE_OTHER): Payer: Medicare Other | Admitting: Adult Health

## 2021-06-05 ENCOUNTER — Other Ambulatory Visit: Payer: Self-pay

## 2021-06-05 VITALS — BP 120/70 | HR 90 | Temp 98.5°F | Ht 68.0 in | Wt 131.0 lb

## 2021-06-05 DIAGNOSIS — F32A Depression, unspecified: Secondary | ICD-10-CM

## 2021-06-05 DIAGNOSIS — R4189 Other symptoms and signs involving cognitive functions and awareness: Secondary | ICD-10-CM

## 2021-06-05 DIAGNOSIS — F419 Anxiety disorder, unspecified: Secondary | ICD-10-CM | POA: Diagnosis not present

## 2021-06-05 DIAGNOSIS — R634 Abnormal weight loss: Secondary | ICD-10-CM | POA: Diagnosis not present

## 2021-06-05 DIAGNOSIS — E785 Hyperlipidemia, unspecified: Secondary | ICD-10-CM

## 2021-06-05 LAB — LIPID PANEL
Cholesterol: 285 mg/dL — ABNORMAL HIGH (ref 0–200)
HDL: 56.9 mg/dL (ref >39.00)
LDL Cholesterol: 207 mg/dL — ABNORMAL HIGH (ref 0–99)
NonHDL: 228.5
Total CHOL/HDL Ratio: 5
Triglycerides: 108 mg/dL (ref 0.0–149.0)
VLDL: 21.6 mg/dL (ref 0.0–40.0)

## 2021-06-05 NOTE — Progress Notes (Signed)
Subjective:    Patient ID: Jason Farmer, male    DOB: Mar 10, 1935, 86 y.o.   MRN: [id number]  HPI 86 year old male who  has a past medical history of Anxiety, Arthritis, Chronic lower back pain, Essential tremor, GERD (gastroesophageal reflux disease), Glaucoma, Heart murmur, Hepatitis (~ 1958), HOH (hard of hearing), Hyperlipemia, Migraine, Seasonal allergies, TIA (transient ischemic attack) (04/2015), Wears glasses, and Wears hearing aid.  He presents to the office today for 6 week follow up regarding anxiety/depression/cognitive impairment.  During his last visit he reported that his anxiety was somewhat better controlled and was sleeping better but was continuing to have stress dealing with his wife.  At this time he was on Celexa 10 mg.  Prior to that he was on Remeron but decided to discontinue this as he did not feel like it was working any longer.  When he came off Remeron his weight decreased about 6 pounds and his appetite was down.  Was placed back on Remeron with the Celexa  He also felt during his last visit that he was having issues with cognitive impairment, especially short-term memory loss.  He had a recent ER visit that he could not recall much about the visit.  His daughter who is with him at this visit also noticed that his short-term memory has been getting a little worse as time went on.  He had noted age-related atrophy and on CTs and MRIs in the past.  He also reported a chronic fatigue and foggy headed feeling.  He was continuing to take Elavil 25 mg nightly.  The decision was made to discontinue Elavil and his statin.  He was also referred to neurology was seen on 05/09/2021.  Neurology started him on Aricept 5 mg daily for 2 weeks and then increase to a full tablet 10 mg daily.  Fortunately after he started taking Aricept he had vivid dreams, and fecal incontinence, and nausea and vomiting.  Neurology stopped his Aricept and advised follow-up in 1 to 2 weeks  Today he  reports that he " feels a lot better and more clear headed". His appetite has just started to pick up. He feels as though his depression is better managed.   He does not plan to follow up with Neurology as he did not feel like he had a good report with the provider.   Wt Readings from Last 3 Encounters:  06/05/21 131 lb (59.4 kg)  05/09/21 135 lb (61.2 kg)  04/16/21 132 lb (59.9 kg)    Review of Systems See HPI   Past Medical History:  Diagnosis Date   Anxiety    "not a problem for a long time" (11/27/2016)   Arthritis    "wrists, knees, shoulders, back" (11/27/2016)   Chronic lower back pain    "5th disc is protruding" (11/27/2016)   Essential tremor    GERD (gastroesophageal reflux disease)    Glaucoma    Heart murmur    dx'd in [employer] in the 1960s; haven't been seen for it since "(11/27/2016)   Hepatitis ~ 1958   "jaundice kind"   HOH (hard of hearing)    Hyperlipemia    Migraine    "none since my neck OR" (11/27/2016)   Seasonal allergies    TIA (transient ischemic attack) 04/2015   Wears glasses    Wears hearing aid     Social History   Socioeconomic History   Marital status: Married    Spouse name: Not on file  Number of children: 4   Years of education: Not on file   Highest education level: Not on file  Occupational History   Not on file  Tobacco Use   Smoking status: Former    Packs/day: 2.00    Years: 30.00    Pack years: 60.00    Types: Cigarettes    Quit date: 06/03/1984    Years since quitting: 37.0   Smokeless tobacco: Never   Tobacco comments:    had AAA at the [place]; also noted 2018 on Dr. Kyla Balzarine note   Substance and Sexual Activity   Alcohol use: No    Comment: 11/27/2016 "quit in 1985"   Drug use: No   Sexual activity: Not Currently  Other Topics Concern   Not on file  Social History Narrative   Retired from [employer]   Married    4 children, one son lives locally   Social Determinants of Health   Financial Resource Strain: Low Risk     Difficulty of Paying Living Expenses: Not hard at all  Food Insecurity: No Food Insecurity   Worried About Charity fundraiser in the Last Year: Never true   Arboriculturist in the Last Year: Never true  Transportation Needs: No Transportation Needs   Lack of Transportation (Medical): No   Lack of Transportation (Non-Medical): No  Physical Activity: Sufficiently Active   Days of Exercise per Week: 5 days   Minutes of Exercise per Session: 60 min  Stress: Stress Concern Present   Feeling of Stress : To some extent  Social Connections: Moderately Isolated   Frequency of Communication with Friends and Family: Twice a week   Frequency of Social Gatherings with Friends and Family: Once a week   Attends Religious Services: Never   Marine scientist or Organizations: No   Attends Music therapist: Never   Marital Status: Married  Human resources officer Violence: Not At Risk   Fear of Current or Ex-Partner: No   Emotionally Abused: No   Physically Abused: No   Sexually Abused: No    Past Surgical History:  Procedure Laterality Date   ANTERIOR CERVICAL DECOMP/DISCECTOMY FUSION  2007   APPENDECTOMY     BACK SURGERY     CARPAL TUNNEL RELEASE Right 10/12/2013   Procedure: RIGHT CARPAL TUNNEL RELEASE;  Surgeon: Wynonia Sours, MD;  Location: [facility];  Service: Orthopedics;  Laterality: Right;   CATARACT EXTRACTION W/ INTRAOCULAR LENS  IMPLANT, BILATERAL Bilateral    COLONOSCOPY     HEMORRHOID SURGERY     KNEE ARTHROSCOPY Left 1970s   NASAL SEPTOPLASTY W/ TURBINOPLASTY Bilateral 02/15/2013   Procedure: NASAL SEPTOPLASTY WITH BILATER TURBINATE REDUCTION;  Surgeon: Ascencion Dike, MD;  Location: [facility];  Service: ENT;  Laterality: Bilateral;   TONSILLECTOMY     TRIGGER FINGER RELEASE  06/08/2012   Procedure: RELEASE TRIGGER FINGER/A-1 PULLEY;  Surgeon: Wynonia Sours, MD;  Location: [place];  Service: Orthopedics;  Laterality: Left;   Release A-1 Pulley Left Long Finger   TRIGGER FINGER RELEASE  07/14/2012   Procedure: RELEASE TRIGGER FINGER/A-1 PULLEY;  Surgeon: Wynonia Sours, MD;  Location: [facility];  Service: Orthopedics;  Laterality: Right;   TRIGGER FINGER RELEASE Right 10/12/2013   Procedure: RELEASE TRIGGER FINGER/A-1 PULLEY RIGHT MIDDLE FINGER;  Surgeon: Wynonia Sours, MD;  Location: [facility];  Service: Orthopedics;  Laterality: Right;   UPPER GASTROINTESTINAL ENDOSCOPY  Family History  Problem Relation Age of Onset   Dementia Father    Heart failure Father    Glaucoma Mother    Colon cancer Neg Hx     Allergies  Allergen Reactions   Statins Other (See Comments)    Muscle cramps    Aspirin Other (See Comments)    bleeding    Current Outpatient Medications on File Prior to Visit  Medication Sig Dispense Refill   acetaminophen (TYLENOL) 325 MG tablet Take 325 mg by mouth every 6 (six) hours as needed for moderate pain or headache.     atenolol (TENORMIN) 25 MG tablet TAKE (1/2) TABLET DAILY. (Patient taking differently: Take 12.5 mg by mouth daily.) 45 tablet 3   azelastine (ASTELIN) 0.1 % nasal spray Place 2 sprays into both nostrils at bedtime. Use in each nostril as directed     Cholecalciferol (VITAMIN D3) 125 MCG (5000 UT) CAPS Take 5,000 Units by mouth daily.     donepezil (ARICEPT) 10 MG tablet Take 1 tablet (10 mg total) by mouth at bedtime. 30 tablet 3   escitalopram (LEXAPRO) 10 MG tablet Take 1 tablet (10 mg total) by mouth daily. 90 tablet 0   ezetimibe (ZETIA) 10 MG tablet TAKE 1 TABLET EACH DAY. (Patient taking differently: Take 10 mg by mouth daily.) 90 tablet 0   latanoprost (XALATAN) 0.005 % ophthalmic solution Place 1 drop into both eyes at bedtime.      mirtazapine (REMERON) 15 MG tablet Take 1 tablet (15 mg total) by mouth at bedtime. 90 tablet 0   omeprazole (PRILOSEC) 20 MG capsule TAKE ONE CAPSULE BY MOUTH DAILY (Patient taking differently: Take 20  mg by mouth daily.) 90 capsule 0   simvastatin (ZOCOR) 20 MG tablet Take 1 tablet (20 mg total) by mouth every other day. (Patient not taking: No sig reported) 45 tablet 3   No current facility-administered medications on file prior to visit.    There were no vitals taken for this visit.      Objective:   Physical Exam Vitals and nursing note reviewed.  Constitutional:      Appearance: Normal appearance.  Cardiovascular:     Rate and Rhythm: Normal rate and regular rhythm.     Pulses: Normal pulses.     Heart sounds: Normal heart sounds.  Pulmonary:     Effort: Pulmonary effort is normal.     Breath sounds: Normal breath sounds.  Musculoskeletal:        General: Normal range of motion.  Skin:    General: Skin is warm and dry.     Capillary Refill: Capillary refill takes less than 2 seconds.  Neurological:     General: No focal deficit present.     Mental Status: He is alert and oriented to person, place, and time.  Psychiatric:        Mood and Affect: Mood normal.        Behavior: Behavior normal.        Thought Content: Thought content normal.        Judgment: Judgment normal.      Assessment & Plan:  1. Hyperlipidemia, unspecified hyperlipidemia type - Continue with Zetia. D/c staitn  - Lipid panel; Future  2. Anxiety and depression - Continue with Remeron and Lexapro   3. Weight loss - Continue with Remeron   4. Cognitive decline - Symptoms seem to be improving since stopping Elavil and statin.  - Continue to monitor - Does not want to  be seen by another neurologist at this time   Dorothyann Peng, NP

## 2021-06-05 NOTE — Addendum Note (Signed)
Addended by: Amanda Cockayne on: 06/05/2021 09:56 AM   Modules accepted: Orders

## 2021-06-05 NOTE — Patient Instructions (Signed)
It was great seeing you today   I am glad you are feeling better   I want you to just take the medications on your med list that I printed off for you

## 2021-06-11 ENCOUNTER — Other Ambulatory Visit: Payer: Self-pay

## 2021-06-11 ENCOUNTER — Other Ambulatory Visit: Payer: Self-pay | Admitting: Adult Health

## 2021-06-11 DIAGNOSIS — K219 Gastro-esophageal reflux disease without esophagitis: Secondary | ICD-10-CM

## 2021-06-11 MED ORDER — SIMVASTATIN 10 MG PO TABS: 10.0000 mg | ORAL_TABLET | ORAL | 1 refills | Status: DC

## 2021-06-13 DIAGNOSIS — M25812 Other specified joint disorders, left shoulder: Secondary | ICD-10-CM | POA: Diagnosis not present

## 2021-06-13 DIAGNOSIS — M25811 Other specified joint disorders, right shoulder: Secondary | ICD-10-CM | POA: Diagnosis not present

## 2021-06-20 DIAGNOSIS — Z23 Encounter for immunization: Secondary | ICD-10-CM | POA: Diagnosis not present

## 2021-06-24 DIAGNOSIS — F322 Major depressive disorder, single episode, severe without psychotic features: Secondary | ICD-10-CM

## 2021-06-24 DIAGNOSIS — I10 Essential (primary) hypertension: Secondary | ICD-10-CM

## 2021-06-27 DIAGNOSIS — Z961 Presence of intraocular lens: Secondary | ICD-10-CM | POA: Diagnosis not present

## 2021-06-27 DIAGNOSIS — H401131 Primary open-angle glaucoma, bilateral, mild stage: Secondary | ICD-10-CM | POA: Diagnosis not present

## 2021-06-27 DIAGNOSIS — H524 Presbyopia: Secondary | ICD-10-CM | POA: Diagnosis not present

## 2021-06-28 ENCOUNTER — Telehealth: Payer: Self-pay | Admitting: Pharmacist

## 2021-06-28 NOTE — Chronic Care Management (AMB) (Signed)
    Chronic Care Management Pharmacy Assistant   Name: Jason Farmer  MRN: [id number] DOB: 14-Nov-1934  Reason for Encounter: Disease State / Hyperlipidemia assessment call   Conditions to be addressed/monitored: HLD  Recent office visits:  06/05/2021 Dorothyann Peng NP (PCP) - Patient was seen for Hyperlipidemia and additional issues.  Discontinued Donepazil and Simvastatin. No follow up noted.  Recent consult visits:  None  Hospital visits:  None  Medications: Outpatient Encounter Medications as of 06/28/2021  Medication Sig   acetaminophen (TYLENOL) 325 MG tablet Take 325 mg by mouth every 6 (six) hours as needed for moderate pain or headache.   atenolol (TENORMIN) 25 MG tablet TAKE (1/2) TABLET DAILY. (Patient taking differently: Take 12.5 mg by mouth daily.)   azelastine (ASTELIN) 0.1 % nasal spray Place 2 sprays into both nostrils at bedtime. Use in each nostril as directed   Cholecalciferol (VITAMIN D3) 125 MCG (5000 UT) CAPS Take 5,000 Units by mouth daily.   escitalopram (LEXAPRO) 10 MG tablet TAKE ONE TABLET BY MOUTH DAILY   ezetimibe (ZETIA) 10 MG tablet TAKE 1 TABLET EACH DAY. (Patient taking differently: Take 10 mg by mouth daily.)   latanoprost (XALATAN) 0.005 % ophthalmic solution Place 1 drop into both eyes at bedtime.    mirtazapine (REMERON) 15 MG tablet Take 1 tablet (15 mg total) by mouth at bedtime.   omeprazole (PRILOSEC) 20 MG capsule TAKE ONE CAPSULE BY MOUTH DAILY   simvastatin (ZOCOR) 10 MG tablet Take 1 tablet (10 mg total) by mouth every other day.   No facility-administered encounter medications on file as of 06/28/2021.    Fill History: atenolol 25 mg tablet 05/28/2021 90   escitalopram 10 mg tablet 06/05/2021 90   ezetimibe 10 mg tablet 12/18/2020 90   latanoprost 0.005 % eye drops 06/12/2021 25   mirtazapine 15 mg tablet 04/16/2021 90   omeprazole 20 mg capsule,delayed release 06/12/2021 90   simvastatin 10 mg tablet 06/11/2021 180    06/28/2021 Name: Jason Farmer MRN: [id number] DOB: [birth year] Jason Farmer is a 86 y.o. year old male who is a primary care patient of Dorothyann Peng, NP.    Lipid Panel    Component Value Date/Time   CHOL 285 (H) 06/05/2021 0956   TRIG 108.0 06/05/2021 0956   HDL 56.90 06/05/2021 0956   LDLCALC 207 (H) 06/05/2021 0956   LDLDIRECT 139.5 09/01/2013 0852    10-year ASCVD risk score: The ASCVD Risk score (Arnett DK, et al., 2019) failed to calculate for the following reasons:   The 2019 ASCVD risk score is only valid for ages 84 to 60  Current antihyperlipidemic regimen:  Simvastatin 10 mg every other day Zetia 10 mg daily Previous antihyperlipidemic medications tried: None ASCVD risk enhancing conditions: age >84 and HTN Any recent hospitalizations or ED visits since last visit with CPP? No   Adherence Review: Does the patient have >5 day gap between last estimated fill dates? No  Unable to reach patient after several attempts.  Care Gaps: AWV - scheduled 01/14/2022 Last BP - 120/70 on 06/05/2021 Shingrix - never done Covid vaccine - overdue Flu - due  Star Rating Drugs: Simvastatin 10 mg - last filled 06/11/2021 180 DS at [facility] Pharmacist Assistant [phone]

## 2021-07-09 ENCOUNTER — Other Ambulatory Visit: Payer: Self-pay | Admitting: Adult Health

## 2021-07-09 DIAGNOSIS — F419 Anxiety disorder, unspecified: Secondary | ICD-10-CM

## 2021-07-09 DIAGNOSIS — R634 Abnormal weight loss: Secondary | ICD-10-CM

## 2021-08-30 ENCOUNTER — Other Ambulatory Visit: Payer: Self-pay | Admitting: Adult Health

## 2021-08-30 DIAGNOSIS — I10 Essential (primary) hypertension: Secondary | ICD-10-CM

## 2021-09-03 ENCOUNTER — Other Ambulatory Visit: Payer: Self-pay

## 2021-09-03 DIAGNOSIS — I10 Essential (primary) hypertension: Secondary | ICD-10-CM

## 2021-09-03 MED ORDER — ATENOLOL 25 MG PO TABS: 12.5000 mg | ORAL_TABLET | Freq: Every day | ORAL | 1 refills | Status: DC

## 2021-09-11 ENCOUNTER — Other Ambulatory Visit: Payer: Self-pay | Admitting: Adult Health

## 2021-09-11 DIAGNOSIS — K219 Gastro-esophageal reflux disease without esophagitis: Secondary | ICD-10-CM

## 2021-09-13 DIAGNOSIS — M79641 Pain in right hand: Secondary | ICD-10-CM | POA: Diagnosis not present

## 2021-09-13 DIAGNOSIS — M79642 Pain in left hand: Secondary | ICD-10-CM | POA: Diagnosis not present

## 2021-10-16 ENCOUNTER — Other Ambulatory Visit: Payer: Self-pay | Admitting: Adult Health

## 2021-10-16 ENCOUNTER — Telehealth: Payer: Self-pay | Admitting: Pharmacist

## 2021-10-16 DIAGNOSIS — R634 Abnormal weight loss: Secondary | ICD-10-CM

## 2021-10-16 DIAGNOSIS — F419 Anxiety disorder, unspecified: Secondary | ICD-10-CM

## 2021-10-16 NOTE — Chronic Care Management (AMB) (Signed)
Chronic Care Management Pharmacy Assistant   Name: Jason Farmer  MRN: [id number] DOB: [birth year]  Reason for Encounter: Disease State / Hyperlipidemia Assessment Call   Conditions to be addressed/monitored: HLD   Recent office visits:  06/05/2021 Jason Peng NP - Patient was seen for hyperlipidemia and additional issues. Discontinued Donepezil and Simvastatin. No follow up noted.   Recent consult visits:  None  Hospital visits:  None  Medications: Outpatient Encounter Medications as of 10/16/2021  Medication Sig   acetaminophen (TYLENOL) 325 MG tablet Take 325 mg by mouth every 6 (six) hours as needed for moderate pain or headache.   atenolol (TENORMIN) 25 MG tablet Take 0.5 tablets (12.5 mg total) by mouth daily.   azelastine (ASTELIN) 0.1 % nasal spray Place 2 sprays into both nostrils at bedtime. Use in each nostril as directed   Cholecalciferol (VITAMIN D3) 125 MCG (5000 UT) CAPS Take 5,000 Units by mouth daily.   escitalopram (LEXAPRO) 10 MG tablet TAKE ONE TABLET BY MOUTH DAILY   ezetimibe (ZETIA) 10 MG tablet TAKE 1 TABLET EACH DAY. (Patient taking differently: Take 10 mg by mouth daily.)   latanoprost (XALATAN) 0.005 % ophthalmic solution Place 1 drop into both eyes at bedtime.    mirtazapine (REMERON) 15 MG tablet Take 1 tablet (15 mg total) by mouth at bedtime.   omeprazole (PRILOSEC) 20 MG capsule TAKE ONE CAPSULE BY MOUTH DAILY   simvastatin (ZOCOR) 10 MG tablet Take 1 tablet (10 mg total) by mouth every other day.   No facility-administered encounter medications on file as of 10/16/2021.  Fill History: atenolol 25 mg tablet 09/03/2021 90   escitalopram 10 mg tablet 09/11/2021 90   latanoprost 0.005 % eye drops 08/23/2021 25   mirtazapine 15 mg tablet 07/10/2021 90   omeprazole 20 mg capsule,delayed release 09/11/2021 90   simvastatin 10 mg tablet 06/11/2021 180   10/16/2021 Name: Jason Farmer MRN: [id number] DOB: 06-Sep-1934 Jason Farmer is a  87 y.o. year old male who is a primary care patient of Jason Peng, NP.  Comprehensive medication review performed; Spoke to patient regarding cholesterol  Lipid Panel    Component Value Date/Time   CHOL 285 (H) 06/05/2021 0956   TRIG 108.0 06/05/2021 0956   HDL 56.90 06/05/2021 0956   LDLCALC 207 (H) 06/05/2021 0956   LDLDIRECT 139.5 09/01/2013 0852    10-year ASCVD risk score: The ASCVD Risk score (Arnett DK, et al., 2019) failed to calculate for the following reasons:   The 2019 ASCVD risk score is only valid for ages 6 to 58  Current antihyperlipidemic regimen:  Simvastatin 10 mg every other day  Previous antihyperlipidemic medications tried: No other medications have been tried.   ASCVD risk enhancing conditions: age >22 and HTN  What recent interventions/DTPs have been made by any provider to improve Cholesterol control since last CPP Visit: No recent interventions.   Any recent hospitalizations or ED visits since last visit with CPP? No recent hospital visits.   What diet changes have been made to improve Cholesterol?    What exercise is being done to improve Cholesterol?    Adherence Review: Does the patient have >5 day gap between last estimated fill dates? No  Unable to reach patient after several attempts  Care Gaps: AWV - scheduled 12/31/2021 Last BP - 120/70 on 06/05/2021 Zoster vaccine - never done Covid booster - overdue   Star Rating Drug: Simvastatin 20mg  last filled 06/11/2021 180 DS at [facility] verified  with [facility] Pharmacist Assistant [phone]

## 2021-11-14 DIAGNOSIS — M25812 Other specified joint disorders, left shoulder: Secondary | ICD-10-CM | POA: Diagnosis not present

## 2021-11-14 DIAGNOSIS — M25811 Other specified joint disorders, right shoulder: Secondary | ICD-10-CM | POA: Diagnosis not present

## 2021-11-28 ENCOUNTER — Ambulatory Visit (INDEPENDENT_AMBULATORY_CARE_PROVIDER_SITE_OTHER): Payer: Medicare Other | Admitting: Family Medicine

## 2021-11-28 VITALS — BP 144/81 | HR 89 | Temp 98.5°F | Wt 132.2 lb

## 2021-11-28 DIAGNOSIS — G4700 Insomnia, unspecified: Secondary | ICD-10-CM | POA: Diagnosis not present

## 2021-11-28 DIAGNOSIS — R252 Cramp and spasm: Secondary | ICD-10-CM | POA: Diagnosis not present

## 2021-11-28 MED ORDER — CYCLOBENZAPRINE HCL 5 MG PO TABS: 5.0000 mg | ORAL_TABLET | Freq: Every evening | ORAL | 0 refills | Status: DC | PRN

## 2021-11-28 NOTE — Progress Notes (Signed)
Subjective:  ? ? Patient ID: Jason Farmer, male    DOB: December 22, 1934, 87 y.o.   MRN: [id number] ? ?Chief Complaint  ?Patient presents with  ? Insomnia  ?  Has not slept in 3 days and nights.has been having cramps at night when lays down. After about 10 mins laying in bed, cramps start and will not stop. Has tried a pill by Dr Zoe Lan, mustard, nothing has helped  ?Pt accompanied by his daughter. ? ?HPI ?Patient was seen today for acute concern.  Pt endorses h/o muscle cramps that may have once a month.  Would eat mustard for relief.  Pt with increased cramps and spasms in legs x 3 days causing insomnia.  Pt denies changes in meds or foods.  Patient mentions his wife was hospitalized 2 days ago. ? ?When discussing plan with patient he becomes irritated initially declining labs as he did not feel they would be helpful in allowing him to sleep.  Patient began repeating HPI in a louder tone. ? ?Past Medical History:  ?Diagnosis Date  ? Anxiety   ? "not a problem for a long time" (11/27/2016)  ? Arthritis   ? "wrists, knees, shoulders, back" (11/27/2016)  ? Chronic lower back pain   ? "5th disc is protruding" (11/27/2016)  ? Essential tremor   ? GERD (gastroesophageal reflux disease)   ? Glaucoma   ? Heart murmur   ? dx'd in [employer] in the 1960s; haven't been seen for it since "(11/27/2016)  ? Hepatitis ~ 1958  ? "jaundice kind"  ? HOH (hard of hearing)   ? Hyperlipemia   ? Migraine   ? "none since my neck OR" (11/27/2016)  ? Seasonal allergies   ? TIA (transient ischemic attack) 04/2015  ? Wears glasses   ? Wears hearing aid   ? ? ?Allergies  ?Allergen Reactions  ? Statins Other (See Comments)  ?  Muscle cramps ?  ? Aspirin Other (See Comments)  ?  bleeding  ? ? ?ROS ?General: Denies fever, chills, night sweats, changes in weight, changes in appetite  + insomnia ?HEENT: Denies headaches, ear pain, changes in vision, rhinorrhea, sore throat ?CV: Denies CP, palpitations, SOB, orthopnea ?Pulm: Denies SOB, cough, wheezing ?GI: Denies  abdominal pain, nausea, vomiting, diarrhea, constipation ?GU: Denies dysuria, hematuria, frequency, vaginal discharge ?Msk: Denies joint pains  + muscle cramps ?Neuro: Denies weakness, numbness, tingling ?Skin: Denies rashes, bruising ?Psych: Denies depression, anxiety, hallucinations ? ?   ?Objective:  ?  ?Blood pressure (!) 144/81, pulse 89, temperature 98.5 ?F (36.9 ?C), temperature source Oral, weight 132 lb 3.2 oz (60 kg), SpO2 100 %. ? ?Gen. Pleasant, well-nourished, in no distress, normal affect   ?HEENT: [place]/AT, face symmetric, conjunctiva clear, no scleral icterus, PERRLA, EOMI, nares patent without drainage ?Lungs: no accessory muscle use ?Cardiovascular: RRR, no peripheral edema ?Musculoskeletal: No deformities, no cyanosis or clubbing, normal tone ?Neuro:  A&Ox3, CN II-XII intact, normal gait ?Skin:  Warm, no lesions/ rash ? ? ?Wt Readings from Last 3 Encounters:  ?11/28/21 132 lb 3.2 oz (60 kg)  ?06/05/21 131 lb (59.4 kg)  ?05/09/21 135 lb (61.2 kg)  ? ? ?Lab Results  ?Component Value Date  ? WBC 8.3 04/02/2021  ? HGB 13.7 04/02/2021  ? HCT 41.1 04/02/2021  ? PLT 229 04/02/2021  ? GLUCOSE 83 04/02/2021  ? CHOL 285 (H) 06/05/2021  ? TRIG 108.0 06/05/2021  ? HDL 56.90 06/05/2021  ? LDLDIRECT 139.5 09/01/2013  ? LDLCALC 207 (H) 06/05/2021  ?  ALT 14 04/02/2021  ? AST 19 04/02/2021  ? NA 135 04/02/2021  ? K 3.7 04/02/2021  ? CL 101 04/02/2021  ? CREATININE 1.19 04/02/2021  ? BUN 15 04/02/2021  ? CO2 23 04/02/2021  ? TSH 1.40 09/26/2020  ? PSA 3.86 09/04/2014  ? ? ?Assessment/Plan: ? ?Muscle cramps ?-Chronic condition which increased in severity and frequency in the last 3 days ?-Advised on possible causes of muscle cramps. ?-Labs recommended.  Patient initially declined. ?-Trial of low-dose Flexeril.  Advised may increase fall risk.  Discussed r/b/a. ?-Given handout ?-Advised to follow-up with his PCP ? - Plan: cyclobenzaprine (FLEXERIL) 5 MG tablet, CBC with Differential/Platelet, CMP, Folate, Magnesium,  TSH, Vitamin B12 ? ?Insomnia, unspecified type ?-Sleep hygiene ?-Consider OTC sleep aids such as melatonin.  Patient declines. ?- Plan: TSH ? ?F/u as needed with PCP ? ?Grier Mitts, MD ?

## 2021-11-29 LAB — CBC WITH DIFFERENTIAL/PLATELET
Absolute Monocytes: 774 cells/uL (ref 200–950)
Basophils Absolute: 20 cells/uL (ref 0–200)
Basophils Relative: 0.2 %
Eosinophils Absolute: 69 cells/uL (ref 15–500)
Eosinophils Relative: 0.7 %
HCT: 42.9 % (ref 38.5–50.0)
Hemoglobin: 14.3 g/dL (ref 13.2–17.1)
Lymphs Abs: 2127 cells/uL (ref 850–3900)
MCH: 30.6 pg (ref 27.0–33.0)
MCHC: 33.3 g/dL (ref 32.0–36.0)
MCV: 91.7 fL (ref 80.0–100.0)
MPV: 9.7 fL (ref 7.5–12.5)
Monocytes Relative: 7.9 %
Neutro Abs: 6811 cells/uL (ref 1500–7800)
Neutrophils Relative %: 69.5 %
Platelets: 284 10*3/uL (ref 140–400)
RBC: 4.68 10*6/uL (ref 4.20–5.80)
RDW: 13 % (ref 11.0–15.0)
Total Lymphocyte: 21.7 %
WBC: 9.8 10*3/uL (ref 3.8–10.8)

## 2021-11-29 LAB — TSH: TSH: 0.84 mIU/L (ref 0.40–4.50)

## 2021-11-29 LAB — MAGNESIUM: Magnesium: 2.2 mg/dL (ref 1.5–2.5)

## 2021-11-29 LAB — COMPREHENSIVE METABOLIC PANEL
AG Ratio: 1.8 (calc) (ref 1.0–2.5)
ALT: 14 U/L (ref 9–46)
AST: 21 U/L (ref 10–35)
Albumin: 4.2 g/dL (ref 3.6–5.1)
Alkaline phosphatase (APISO): 51 U/L (ref 35–144)
BUN/Creatinine Ratio: 16 (calc) (ref 6–22)
BUN: 26 mg/dL — ABNORMAL HIGH (ref 7–25)
CO2: 26 mmol/L (ref 20–32)
Calcium: 9.8 mg/dL (ref 8.6–10.3)
Chloride: 101 mmol/L (ref 98–110)
Creat: 1.59 mg/dL — ABNORMAL HIGH (ref 0.70–1.22)
Globulin: 2.4 g/dL (calc) (ref 1.9–3.7)
Glucose, Bld: 98 mg/dL (ref 65–99)
Potassium: 4.7 mmol/L (ref 3.5–5.3)
Sodium: 136 mmol/L (ref 135–146)
Total Bilirubin: 0.4 mg/dL (ref 0.2–1.2)
Total Protein: 6.6 g/dL (ref 6.1–8.1)

## 2021-11-29 LAB — FOLATE: Folate: 14.9 ng/mL

## 2021-11-29 LAB — VITAMIN B12: Vitamin B-12: 303 pg/mL (ref 200–1100)

## 2021-12-02 ENCOUNTER — Encounter: Payer: Self-pay | Admitting: Family Medicine

## 2021-12-03 ENCOUNTER — Telehealth: Payer: Self-pay | Admitting: Pharmacist

## 2021-12-03 NOTE — Chronic Care Management (AMB) (Signed)
? ? ? ?  Chronic Care Management ?Pharmacy Assistant  ? ?Name: Jason Farmer  MRN: [id number] DOB: 02-01-1935 ? ?12/04/2021 APPOINTMENT REMINDER ? ? ?Called [name], No answer, left message of appointment on 12/04/2021 at 1:00 via telephone visit with [name], Pharm D. Notified to have all medications, supplements, blood pressure and/or blood sugar logs available during appointment and to return call if need to reschedule. ? ?Care Gaps: ?AWV - scheduled 12/31/2021 ?Last BP - 144/81 on 11/28/2021 ?Zoster vaccine - never done ?Covid booster - overdue ?  ?Star Rating Drug: ?Simvastatin '20mg'$  last filled 06/11/2021 180 DS at [facility] verified with Abigail Butts ? ?Any gaps in medications fill history? No ? ?[name] CMA  ?Clinical Pharmacist Assistant ?[phone] ? ?

## 2021-12-04 ENCOUNTER — Ambulatory Visit (INDEPENDENT_AMBULATORY_CARE_PROVIDER_SITE_OTHER): Payer: Medicare Other | Admitting: Pharmacist

## 2021-12-04 DIAGNOSIS — I10 Essential (primary) hypertension: Secondary | ICD-10-CM

## 2021-12-04 DIAGNOSIS — E785 Hyperlipidemia, unspecified: Secondary | ICD-10-CM

## 2021-12-04 NOTE — Patient Instructions (Signed)
Hi [place], ? ?It was great to get catch up with you again! ? ?Please reach out to me if you have any questions or need anything before our follow up! ? ?Best, ?Maddie ? ?[name], PharmD, BCACP ?Clinical Pharmacist ?Therapist, music at [facility] ?[phone] ? ? Visit Information ? ? Goals Addressed   ?None ?  ? ?Patient Care Plan: [place]  ?  ? ?Problem Identified: Problem: Hypertension, Hyperlipidemia, GERD, Depression, Allergic Rhinitis and ocular migraine, glaucoma, and vitamin D deficiency   ?  ? ?Long-Range Goal: Patient-Specific Goal   ?Start Date: 10/15/2020  ?Expected End Date: 10/15/2021  ?Recent Progress: On track  ?Priority: High  ?Note:   ?Current Barriers:  ?Unable to achieve control of cholesterol  ?Unable to self administer medications as prescribed ? ?Pharmacist Clinical Goal(s):  ?Patient will achieve adherence to monitoring guidelines and medication adherence to achieve therapeutic efficacy ?achieve control of cholesterol as evidenced by next lipid panel  through collaboration with PharmD and provider.  ? ?Interventions: ?1:1 collaboration with Dorothyann Peng, NP regarding development and update of comprehensive plan of care as evidenced by provider attestation and co-signature ?Inter-disciplinary care team collaboration (see longitudinal plan of care) ?Comprehensive medication review performed; medication list updated in electronic medical record ? ?Hypertension (BP goal <140/90) ?-Controlled ?-Current treatment: ?Atenolol 25 mg 1/2 tablet daily - Appropriate, Query effective, Safe, Accessible ?-Medications previously tried: none ?-Current home readings: could not provide ?-Current dietary habits: has cut back on salt a lot and does not eat canned foods; he does admit to looking at package labels for sodium content ?-Current exercise habits: walks 3-4 miles a day at two different times of the day ?-Denies hypotensive/hypertensive symptoms ?-Educated on Importance of home  blood pressure monitoring; ?-Counseled to monitor BP at home weekly, document, and provide log at future appointments ?-Recommended to continue current medication ? ?Hyperlipidemia: (LDL goal < 70) ?-Uncontrolled ?-Current treatment: ?Ezetimibe 10 mg 1 tablet daily - Appropriate, Query effective, Safe, Accessible ?Simvastatin 10 mg 1 tablet every other day - Appropriate, Query effective, Safe, Accessible ?-Medications previously tried:  statins (muscle cramps) ?-Current dietary patterns: patient eats more chicken than red meat and does not drink alcohol ?-Current exercise habits:  walks 3-4 miles a day at two different times of the day ?-Educated on Cholesterol goals;  ?Benefits of statin for ASCVD risk reduction; ?-Recommended to continue current medication ? ?Depression/Insomnia (Goal: minimize symptoms of depression and improve quality and quantity of sleep) ?-Controlled ?-Current treatment: ?Mirtazapine 15 mg 1 tablet at bedtime - Appropriate, Effective, Safe, Accessible ?Escitalopram 10 mg 1 tablet daily - Appropriate, Effective, Safe, Accessible ?-Medications previously tried/failed: n/a ?-PHQ9: 10 ?-Educated on Benefits of medication for symptom control ?-Recommended to continue current medication ? ?Ocular migraine (Goal: minimize symptoms) ?-Controlled ?-Current treatment  ?None ?-Medications previously tried: amitriptyline (cognitive impairment) ?-Recommended to continue current medication ? ?GERD/dysphagia (Goal: minimize symptoms of heartburn or acid reflux) ?-Controlled ?-Current treatment  ?Omeprazole 20 mg 1 capsule daily - Appropriate, Effective, Safe, Accessible ?-Medications previously tried: none  ?-Recommended to continue current medication ?Recommended ENT referral ? ?Vitamin D deficiency (Goal: vitamin D 30-100) ?-Uncontrolled ?-Current treatment  ?Vitamin D 5000 units 1 capsule daily - Appropriate, Query effective, Safe, Accessible ?-Medications previously tried: none  ?-Recommended to  continue current medication ?Counseled on drinking plenty of water ? ?Allergic rhinitis (Goal: minimize symptoms of allergies) ?-Controlled ?-Current treatment  ?Atrovent nasal spray PRN - Appropriate, Effective, Safe, Accessible ?Claritin 10 mg 1 tablet daily    -  Appropriate, Effective, Safe, Accessible ?-Medications previously tried: montelukast (cognitive impairment) ?-Recommended to continue current medication ? ?Glaucoma (Goal: lower intraocular pressure) ?-Controlled ?-Current treatment  ?Latanoprost 0.005% 1 drop in both eyes at bedtime - Appropriate, Effective, Safe, Accessible ?-Medications previously tried: none  ?-Recommended to continue current medication ? ?Health Maintenance ?-Vaccine gaps: Shingrix and tetanus ?-Current therapy:  ?Benefiber daily ?-Educated on Cost vs benefit of each product must be carefully weighed by individual consumer ?-Patient is satisfied with current therapy and denies issues ?-Recommended to continue current medication ? ?Patient Goals/Self-Care Activities ?Patient will:  ?- take medications as prescribed ?check blood pressure weekly, document, and provide at future appointments ? ?Follow Up Plan: Telephone follow up appointment with care management team member scheduled for: 6 months ? ?  ?  ? ?Patient verbalizes understanding of instructions and care plan provided today and agrees to view in [place]. Active MyChart status confirmed with patient.   ?Telephone follow up appointment with pharmacy team member scheduled for: 6 months ? ?Viona Gilmore, RPH  ?

## 2021-12-04 NOTE — Progress Notes (Signed)
? ?Chronic Care Management ?Pharmacy Note ? ?12/04/2021 ?Name:  Jason Farmer MRN:  [id number] DOB:  09/10/1934 ? ?Summary: ?Patient is unsure of what medications he is taking as his wife manages them ?BP is at goal < 140/90 per office readings ?LDL significantly elevated ? ?Recommendations/Changes made from today's visit: ?-Recommended routine monitoring of BP at home ?-Recommend repeat lipid panel and vitamin D level ?-Consider [facility] for packaging benefit ? ?Plan: ?Follow up in 6 months ? ? ?Subjective: ?Jason Farmer is an 87 y.o. year old male who is a primary patient of Nafziger, Tommi Rumps, NP.  The CCM team was consulted for assistance with disease management and care coordination needs.   ? ?Engaged with patient by telephone for follow up visit in response to provider referral for pharmacy case management and/or care coordination services.  ? ?Consent to Services:  ?The patient was given information about Chronic Care Management services, agreed to services, and gave verbal consent prior to initiation of services.  Please see initial visit note for detailed documentation.  ? ?Patient Care Team: ?Dorothyann Peng, NP as PCP - General (Family Medicine) ?Rozetta Nunnery, MD (Inactive) as Consulting Physician (Otolaryngology) ?[facility] as Set designer (Orthopedic Surgery) ?Viona Gilmore, [facility] as Pharmacist (Pharmacist) ? ?Recent office visits: ?11/28/21 Grier Mitts, MD: Patient presented for insomnia and muscle cramps. Prescribed cyclobenzaprine 5 mg PRN.  ? ?06/05/2021 Dorothyann Peng NP - Patient was seen for hyperlipidemia and additional issues. Recommended taking simvastatin every other day. ? ?Recent consult visits: ?11/14/21 Victorino December ([facility]): Patient presented for rotator cuff of right and left shoulder follow up. Unable to access notes. ? ?05/17/21 Patient message (neurology): Recommended holding donepezil due to diarrhea and try again in 1-2 weeks. ? ?05/09/21 Sharene Butters, PA-C (neurology): Patient presented for follow up for mild neurocognitive disorder. Prescribed donepezil 1/2 tablet daily x 2 weeks then increase to 1 full tablet at 10 mg daily. ? ?Hospital visits: ?Medication Reconciliation was completed by comparing discharge summary, patient?s EMR and Pharmacy list, and upon discussion with patient. ?  ?Patient presented to [facility] ED on 04[facility]01-2021 due to Cellulitis of left leg. He was there for 21 hours. ?  ?New?Medications Started at [facility] Discharge:?? ?-started  ?Keflex 500 mg - Take 1 capsule  by mouth 4 (four) times daily for 7 days. ?NORCO/VICODIN 5-325 MG-Take 1 tablet by mouth every 6 (six) hours as needed. ?  ?Medication Changes at Hospital Discharge: ?-Changed  ?None ?  ?Medications Discontinued at Hospital Discharge: ?-Stopped  ?None ?  ?Medications that remain the same after Hospital Discharge:??  ?-All other medications will remain the same.   ? ?Objective: ? ?Lab Results  ?Component Value Date  ? CREATININE 1.59 (H) 11/28/2021  ? BUN 26 (H) 11/28/2021  ? GFR 49.02 (L) 09/26/2020  ? GFRNONAA >60 04/02/2021  ? GFRAA 51 (L) 03/16/2020  ? NA 136 11/28/2021  ? K 4.7 11/28/2021  ? CALCIUM 9.8 11/28/2021  ? CO2 26 11/28/2021  ? ? ?Lab Results  ?Component Value Date/Time  ? GFR 49.02 (L) 09/26/2020 10:47 AM  ? GFR 50.99 (L) 08/30/2019 08:26 AM  ?  ?Last diabetic Eye exam: No results found for: HMDIABEYEEXA  ?Last diabetic Foot exam: No results found for: HMDIABFOOTEX  ? ?Lab Results  ?Component Value Date  ? CHOL 285 (H) 06/05/2021  ? HDL 56.90 06/05/2021  ? LDLCALC 207 (H) 06/05/2021  ? LDLDIRECT 139.5 09/01/2013  ? TRIG 108.0 06/05/2021  ? CHOLHDL 5  06/05/2021  ? ? ? ?  Latest Ref Rng & Units 11/28/2021  ?  3:49 PM 04/02/2021  ? 12:40 AM 09/26/2020  ? 10:47 AM  ?Hepatic Function  ?Total Protein 6.1 - 8.1 g/dL 6.6   6.1   6.6    ?Albumin 3.5 - 5.0 g/dL  3.3   4.1    ?AST 10 - 35 U/L 21   19   21     ?ALT 9 - 46 U/L 14   14   18     ?Alk Phosphatase  38 - 126 U/L  45   59    ?Total Bilirubin 0.2 - 1.2 mg/dL 0.4   0.7   0.5    ? ? ?Lab Results  ?Component Value Date/Time  ? TSH 0.84 11/28/2021 03:49 PM  ? TSH 1.40 09/26/2020 10:47 AM  ? ? ? ?  Latest Ref Rng & Units 11/28/2021  ?  3:49 PM 04/02/2021  ? 12:40 AM 09/26/2020  ? 10:47 AM  ?CBC  ?WBC 3.8 - 10.8 Thousand/uL 9.8   8.3   6.2    ?Hemoglobin 13.2 - 17.1 g/dL 14.3   13.7   14.7    ?Hematocrit 38.5 - 50.0 % 42.9   41.1   43.8    ?Platelets 140 - 400 Thousand/uL 284   229   266.0    ? ? ?Lab Results  ?Component Value Date/Time  ? VD25OH 27.55 (L) 09/26/2020 10:47 AM  ? VD25OH 20.33 (L) 02/09/2018 01:33 PM  ? ? ?Clinical ASCVD: No  ?The ASCVD Risk score (Arnett DK, et al., 2019) failed to calculate for the following reasons: ?  The 2019 ASCVD risk score is only valid for ages 45 to 57   ? ? ?  11/28/2021  ?  3:51 PM 03/13/2021  ? 10:33 AM 12/25/2020  ?  1:58 PM  ?Depression screen PHQ 2/9  ?Decreased Interest 0 0 0  ?Down, Depressed, Hopeless 1 1 1   ?PHQ - 2 Score 1 1 1   ?Altered sleeping 2 2   ?Tired, decreased energy 1 0   ?Change in appetite 0 3   ?Feeling bad or failure about yourself  0 1   ?Trouble concentrating 2 1   ?Moving slowly or fidgety/restless 0 1   ?Suicidal thoughts 0 0   ?PHQ-9 Score 6 9   ?Difficult doing work/chores Somewhat difficult Not difficult at all   ?  ? ? ?Social History  ? ?Tobacco Use  ?Smoking Status Former  ? Packs/day: 2.00  ? Years: 30.00  ? Pack years: 60.00  ? Types: Cigarettes  ? Quit date: 06/03/1984  ? Years since quitting: 37.5  ?Smokeless Tobacco Never  ?Tobacco Comments  ? had AAA at the [place]; also noted 2018 on Dr. Kyla Balzarine note   ? ?BP Readings from Last 3 Encounters:  ?11/28/21 (!) 144/81  ?06/05/21 120/70  ?04/16/21 108/68  ? ?Pulse Readings from Last 3 Encounters:  ?11/28/21 89  ?06/05/21 90  ?05/09/21 81  ? ?Wt Readings from Last 3 Encounters:  ?11/28/21 132 lb 3.2 oz (60 kg)  ?06/05/21 131 lb (59.4 kg)  ?05/09/21 135 lb (61.2 kg)  ? ? ?Assessment/Interventions: Review of  patient past medical history, allergies, medications, health status, including review of consultants reports, laboratory and other test data, was performed as part of comprehensive evaluation and provision of chronic care management services.  ? ?SDOH:  (Social Determinants of Health) assessments and interventions performed: No ? ? ?CCM Care Plan ? ?Allergies  ?  Allergen Reactions  ? Statins Other (See Comments)  ?  Muscle cramps ?  ? Aspirin Other (See Comments)  ?  bleeding  ? ? ?Medications Reviewed Today   ? ? Reviewed by Viona Gilmore, [place] (Pharmacist) on 12/04/21 at [place] List Status: <None>  ? ?Medication Order Taking? Sig Documenting Provider Last Dose Status Informant  ?acetaminophen (TYLENOL) 325 MG tablet [id number]  Take 325 mg by mouth every 6 (six) hours as needed for moderate pain or headache. [provider]  Active Multiple Informants  ?atenolol (TENORMIN) 25 MG tablet [id number]  Take 0.5 tablets (12.5 mg total) by mouth daily. Nafziger, Tommi Rumps, NP  Active   ?azelastine (ASTELIN) 0.1 % nasal spray [id number]  Place 2 sprays into both nostrils at bedtime. Use in each nostril as directed [provider]  Active Multiple Informants  ?Cholecalciferol (VITAMIN D3) 125 MCG (5000 UT) CAPS [id number]  Take 5,000 Units by mouth daily. [provider]  Active Multiple Informants  ?cyclobenzaprine (FLEXERIL) 5 MG tablet [id number] No Take 1 tablet (5 mg total) by mouth at bedtime as needed for muscle spasms.  ?Patient not taking: Reported on 12/04/2021  ? Billie Ruddy, MD Not Taking Active   ?escitalopram (LEXAPRO) 10 MG tablet [id number]  TAKE ONE TABLET BY MOUTH DAILY Nafziger, Tommi Rumps, NP  Active   ?ezetimibe (ZETIA) 10 MG tablet [id number]  TAKE 1 TABLET EACH DAY.  ?Patient taking differently: Take 10 mg by mouth daily.  ? Nafziger, Tommi Rumps, NP  Active   ?latanoprost (XALATAN) 0.005 % ophthalmic solution [id number]  Place 1 drop into both eyes at bedtime.  [provider]   Active Multiple Informants  ?mirtazapine (REMERON) 15 MG tablet [id number]  TAKE ONE TABLET BY MOUTH AT BEDTIME Nafziger, Tommi Rumps, NP  Active   ?omeprazole (PRILOSEC) 20 MG capsule [id number]  TAKE ONE CAPSULE BY MOU

## 2021-12-13 ENCOUNTER — Other Ambulatory Visit: Payer: Self-pay | Admitting: Adult Health

## 2021-12-13 DIAGNOSIS — K219 Gastro-esophageal reflux disease without esophagitis: Secondary | ICD-10-CM

## 2021-12-18 ENCOUNTER — Other Ambulatory Visit: Payer: Self-pay | Admitting: Adult Health

## 2021-12-19 ENCOUNTER — Ambulatory Visit (INDEPENDENT_AMBULATORY_CARE_PROVIDER_SITE_OTHER): Payer: Medicare Other | Admitting: Adult Health

## 2021-12-19 ENCOUNTER — Encounter: Payer: Self-pay | Admitting: Adult Health

## 2021-12-19 VITALS — BP 122/82 | HR 86 | Temp 98.6°F | Ht 68.0 in | Wt 132.0 lb

## 2021-12-19 DIAGNOSIS — E559 Vitamin D deficiency, unspecified: Secondary | ICD-10-CM | POA: Diagnosis not present

## 2021-12-19 DIAGNOSIS — R252 Cramp and spasm: Secondary | ICD-10-CM | POA: Diagnosis not present

## 2021-12-19 LAB — BASIC METABOLIC PANEL
BUN: 13 mg/dL (ref 6–23)
CO2: 28 mEq/L (ref 19–32)
Calcium: 9.6 mg/dL (ref 8.4–10.5)
Chloride: 103 mEq/L (ref 96–112)
Creatinine, Ser: 1.36 mg/dL (ref 0.40–1.50)
GFR: 47.74 mL/min — ABNORMAL LOW (ref >60.00)
Glucose, Bld: 143 mg/dL — ABNORMAL HIGH (ref 70–99)
Potassium: 4.5 mEq/L (ref 3.5–5.1)
Sodium: 138 mEq/L (ref 135–145)

## 2021-12-19 LAB — VITAMIN D 25 HYDROXY (VIT D DEFICIENCY, FRACTURES): VITD: 69.98 ng/mL (ref 30.00–100.00)

## 2021-12-19 NOTE — Progress Notes (Signed)
? ?Subjective:  ? ? Patient ID: Jason Farmer, male    DOB: 1935/07/21, 87 y.o.   MRN: [id number] ? ?HPI ?87 year old male who  has a past medical history of Anxiety, Arthritis, Chronic lower back pain, Essential tremor, GERD (gastroesophageal reflux disease), Glaucoma, Heart murmur, Hepatitis (~ 1958), HOH (hard of hearing), Hyperlipemia, Migraine, Seasonal allergies, TIA (transient ischemic attack) (04/2015), Wears glasses, and Wears hearing aid. ? ?He presents to the office today for the complaint of leg cramps. He has had leg cramps for many months to years but feels as though his symptoms are getting worse. He cannot sleep due to the cramps. He reports that about 90 % of the time the cramps present at night. There are times when he will not sleep for three nights due to the pain.  ? ?He has tried an OTC muscle cramp pill, stretching, riding an elliptical before bed and mustard, and flexeril. Nothing seems to help  ? ?He was seen by Dr. Volanda Napoleon a few weeks ago for this issue and labs showed a low normal B12 and decreased kidney function.  ? ?He has not started the B12 supplement.  ? ?He also has a history of Vit D def. And does not believe he is taking a supplement for this.  ? ?Has not noticed any swelling in his legs  ? ? ?Review of Systems ?See HPI  ? ?Past Medical History:  ?Diagnosis Date  ? Anxiety   ? "not a problem for a long time" (11/27/2016)  ? Arthritis   ? "wrists, knees, shoulders, back" (11/27/2016)  ? Chronic lower back pain   ? "5th disc is protruding" (11/27/2016)  ? Essential tremor   ? GERD (gastroesophageal reflux disease)   ? Glaucoma   ? Heart murmur   ? dx'd in [employer] in the 1960s; haven't been seen for it since "(11/27/2016)  ? Hepatitis ~ 1958  ? "jaundice kind"  ? HOH (hard of hearing)   ? Hyperlipemia   ? Migraine   ? "none since my neck OR" (11/27/2016)  ? Seasonal allergies   ? TIA (transient ischemic attack) 04/2015  ? Wears glasses   ? Wears hearing aid   ? ? ?Social History  ? ?Socioeconomic  History  ? Marital status: Married  ?  Spouse name: Not on file  ? Number of children: 4  ? Years of education: Not on file  ? Highest education level: Some college, no degree  ?Occupational History  ? Not on file  ?Tobacco Use  ? Smoking status: Former  ?  Packs/day: 2.00  ?  Years: 30.00  ?  Pack years: 60.00  ?  Types: Cigarettes  ?  Quit date: 06/03/1984  ?  Years since quitting: 37.5  ? Smokeless tobacco: Never  ? Tobacco comments:  ?  had AAA at the [place]; also noted 2018 on Dr. Kyla Balzarine note   ?Substance and Sexual Activity  ? Alcohol use: No  ?  Comment: 11/27/2016 "quit in 1985"  ? Drug use: No  ? Sexual activity: Not Currently  ?Other Topics Concern  ? Not on file  ?Social History Narrative  ? Retired from [employer]  ? Married   ? 4 children, one son lives locally  ? ?Social Determinants of Health  ? ?Financial Resource Strain: Low Risk   ? Difficulty of Paying Living Expenses: Not hard at all  ?Food Insecurity: No Food Insecurity  ? Worried About Charity fundraiser in the  Last Year: Never true  ? Ran Out of Food in the Last Year: Never true  ?Transportation Needs: No Transportation Needs  ? Lack of Transportation (Medical): No  ? Lack of Transportation (Non-Medical): No  ?Physical Activity: Unknown  ? Days of Exercise per Week: Patient refused  ? Minutes of Exercise per Session: 60 min  ?Stress: Stress Concern Present  ? Feeling of Stress : Rather much  ?Social Connections: Moderately Integrated  ? Frequency of Communication with Friends and Family: Three times a week  ? Frequency of Social Gatherings with Friends and Family: Once a week  ? Attends Religious Services: Never  ? Active Member of Clubs or Organizations: Yes  ? Attends Archivist Meetings: More than 4 times per year  ? Marital Status: Married  ?Intimate Partner Violence: Not At Risk  ? Fear of Current or Ex-Partner: No  ? Emotionally Abused: No  ? Physically Abused: No  ? Sexually Abused: No  ? ? ?Past Surgical History:  ?Procedure  Laterality Date  ? ANTERIOR CERVICAL DECOMP/DISCECTOMY FUSION  2007  ? APPENDECTOMY    ? BACK SURGERY    ? CARPAL TUNNEL RELEASE Right 10/12/2013  ? Procedure: RIGHT CARPAL TUNNEL RELEASE;  Surgeon: Wynonia Sours, MD;  Location: [place];  Service: Orthopedics;  Laterality: Right;  ? CATARACT EXTRACTION W/ INTRAOCULAR LENS  IMPLANT, BILATERAL Bilateral   ? COLONOSCOPY    ? HEMORRHOID SURGERY    ? KNEE ARTHROSCOPY Left 1970s  ? NASAL SEPTOPLASTY W/ TURBINOPLASTY Bilateral 02/15/2013  ? Procedure: NASAL SEPTOPLASTY WITH BILATER TURBINATE REDUCTION;  Surgeon: Ascencion Dike, MD;  Location: [place];  Service: ENT;  Laterality: Bilateral;  ? TONSILLECTOMY    ? TRIGGER FINGER RELEASE  06/08/2012  ? Procedure: RELEASE TRIGGER FINGER/A-1 PULLEY;  Surgeon: Wynonia Sours, MD;  Location: [place];  Service: Orthopedics;  Laterality: Left;  Release A-1 Pulley Left Long Finger  ? TRIGGER FINGER RELEASE  07/14/2012  ? Procedure: RELEASE TRIGGER FINGER/A-1 PULLEY;  Surgeon: Wynonia Sours, MD;  Location: [place];  Service: Orthopedics;  Laterality: Right;  ? TRIGGER FINGER RELEASE Right 10/12/2013  ? Procedure: RELEASE TRIGGER FINGER/A-1 PULLEY RIGHT MIDDLE FINGER;  Surgeon: Wynonia Sours, MD;  Location: [place];  Service: Orthopedics;  Laterality: Right;  ? UPPER GASTROINTESTINAL ENDOSCOPY    ? ? ?Family History  ?Problem Relation Age of Onset  ? Dementia Father   ? Heart failure Father   ? Glaucoma Mother   ? Colon cancer Neg Hx   ? ? ?Allergies  ?Allergen Reactions  ? Statins Other (See Comments)  ?  Muscle cramps ?  ? Aspirin Other (See Comments)  ?  bleeding  ? ? ?Current Outpatient Medications on File Prior to Visit  ?Medication Sig Dispense Refill  ? acetaminophen (TYLENOL) 325 MG tablet Take 325 mg by mouth every 6 (six) hours as needed for moderate pain or headache.    ? atenolol (TENORMIN) 25 MG tablet Take 0.5 tablets (12.5 mg total) by mouth  daily. 90 tablet 1  ? azelastine (ASTELIN) 0.1 % nasal spray Place 2 sprays into both nostrils at bedtime. Use in each nostril as directed    ? Cholecalciferol (VITAMIN D3) 125 MCG (5000 UT) CAPS Take 5,000 Units by mouth daily.    ? escitalopram (LEXAPRO) 10 MG tablet TAKE ONE TABLET BY MOUTH DAILY 90 tablet 0  ? ezetimibe (ZETIA) 10 MG tablet TAKE 1  TABLET EACH DAY. (Patient taking differently: Take 10 mg by mouth daily.) 90 tablet 0  ? latanoprost (XALATAN) 0.005 % ophthalmic solution Place 1 drop into both eyes at bedtime.     ? mirtazapine (REMERON) 15 MG tablet TAKE ONE TABLET BY MOUTH AT BEDTIME 90 tablet 0  ? omeprazole (PRILOSEC) 20 MG capsule TAKE ONE CAPSULE BY MOUTH ONCE DAILY 90 capsule 0  ? simvastatin (ZOCOR) 10 MG tablet Take 1 tablet (10 mg total) by mouth every other day. 90 tablet 1  ? ?No current facility-administered medications on file prior to visit.  ? ? ?BP 122/82   Pulse 86   Temp 98.6 ?F (37 ?C) (Oral)   Ht '5\' 8"'$  (1.727 m)   Wt 132 lb (59.9 kg)   SpO2 96%   BMI 20.07 kg/m?  ? ? ?   ?Objective:  ? Physical Exam ?Vitals and nursing note reviewed.  ?Constitutional:   ?   Appearance: Normal appearance.  ?Cardiovascular:  ?   Rate and Rhythm: Normal rate and regular rhythm.  ?   Pulses: Normal pulses.  ?   Heart sounds: Normal heart sounds.  ?Pulmonary:  ?   Effort: Pulmonary effort is normal.  ?   Breath sounds: Normal breath sounds.  ?Musculoskeletal:     ?   General: No swelling, tenderness or deformity. Normal range of motion.  ?   Right lower leg: No edema.  ?   Left lower leg: No edema.  ?Skin: ?   General: Skin is warm and dry.  ?Neurological:  ?   General: No focal deficit present.  ?   Mental Status: He is alert and oriented to person, place, and time.  ?Psychiatric:     ?   Mood and Affect: Mood normal.     ?   Behavior: Behavior normal.     ?   Thought Content: Thought content normal.  ? ?   ?Assessment & Plan:  ?1. Muscle cramps ?- Will have him start B Complex ?- May need to  add vitamin D supplement  ?- Can consider adding verapamil at night and or referral to sleep clinic  ?- Basic Metabolic Panel; Future ?- Basic Metabolic Panel ? ?2. Vitamin D deficiency ? ?- VITAMIN D 25 Hydr

## 2021-12-19 NOTE — Patient Instructions (Addendum)
I want you to start taking a Vitamin B Complex  supplement 1000-2000 units daily.  ? ?I am going to check your vitamin D level today  ? ?I will call you once I get your labs back  ? ? ? ? ?

## 2021-12-22 DIAGNOSIS — E785 Hyperlipidemia, unspecified: Secondary | ICD-10-CM

## 2021-12-22 DIAGNOSIS — I10 Essential (primary) hypertension: Secondary | ICD-10-CM | POA: Diagnosis not present

## 2021-12-23 ENCOUNTER — Telehealth: Payer: Self-pay | Admitting: Adult Health

## 2021-12-23 NOTE — Telephone Encounter (Signed)
Pt is calling to let cory know he with to [facility] and they told him vit b complex 1000-2000 ?mg does not exist. Please advise ?

## 2021-12-24 NOTE — Telephone Encounter (Signed)
Patient notified of update  and verbalized understanding. 

## 2021-12-24 NOTE — Telephone Encounter (Signed)
FYI Pt advised to pick up VitB complex. Advised that it was a typo. Pt verbalized understanding.  ?

## 2021-12-26 DIAGNOSIS — H401131 Primary open-angle glaucoma, bilateral, mild stage: Secondary | ICD-10-CM | POA: Diagnosis not present

## 2021-12-26 DIAGNOSIS — H43813 Vitreous degeneration, bilateral: Secondary | ICD-10-CM | POA: Diagnosis not present

## 2021-12-26 DIAGNOSIS — H0100B Unspecified blepharitis left eye, upper and lower eyelids: Secondary | ICD-10-CM | POA: Diagnosis not present

## 2021-12-26 DIAGNOSIS — H0100A Unspecified blepharitis right eye, upper and lower eyelids: Secondary | ICD-10-CM | POA: Diagnosis not present

## 2021-12-31 ENCOUNTER — Ambulatory Visit (INDEPENDENT_AMBULATORY_CARE_PROVIDER_SITE_OTHER): Payer: Medicare Other

## 2021-12-31 VITALS — BP 136/78 | HR 100 | Temp 98.2°F | Ht 67.0 in | Wt 131.7 lb

## 2021-12-31 DIAGNOSIS — Z0000 Encounter for general adult medical examination without abnormal findings: Secondary | ICD-10-CM | POA: Diagnosis not present

## 2021-12-31 NOTE — Patient Instructions (Signed)
[name] , ?Thank you for taking time to come for your Medicare Wellness Visit. I appreciate your ongoing commitment to your health goals. Please review the following plan we discussed and let me know if I can assist you in the future.  ? ?Screening recommendations/referrals: ?Colonoscopy: not required ?Recommended yearly ophthalmology/optometry visit for glaucoma screening and checkup ?Recommended yearly dental visit for hygiene and checkup ? ?Vaccinations: ?Influenza vaccine: due 03/25/2022 ?Pneumococcal vaccine: completed 06/25/2018 ?Tdap vaccine: completed 08/11/2018, due 08/11/2028 ?Shingles vaccine: Shingrix due ?Covid-19: 06/20/2021, 04/14/2020, 10/05/2019, 09/14/2019 ? ?Advanced directives: Please bring a copy of your POA (Power of Attorney) and/or Living Will to your next appointment.  ? ? ?Conditions/risks identified: none ? ?Next appointment: Follow up in one year for your annual wellness visit.  ? ?Preventive Care 28 Years and Older, Male ?Preventive care refers to lifestyle choices and visits with your health care provider that can promote health and wellness. ?What does preventive care include? ?A yearly physical exam. This is also called an annual well check. ?Dental exams once or twice a year. ?Routine eye exams. Ask your health care provider how often you should have your eyes checked. ?Personal lifestyle choices, including: ?Daily care of your teeth and gums. ?Regular physical activity. ?Eating a healthy diet. ?Avoiding tobacco and drug use. ?Limiting alcohol use. ?Practicing safe sex. ?Taking low doses of aspirin every day. ?Taking vitamin and mineral supplements as recommended by your health care provider. ?What happens during an annual well check? ?The services and screenings done by your health care provider during your annual well check will depend on your age, overall health, lifestyle risk factors, and family history of disease. ?Counseling  ?Your health care provider may ask you questions  about your: ?Alcohol use. ?Tobacco use. ?Drug use. ?Emotional well-being. ?Home and relationship well-being. ?Sexual activity. ?Eating habits. ?History of falls. ?Memory and ability to understand (cognition). ?Work and work Statistician. ?Screening  ?You may have the following tests or measurements: ?Height, weight, and BMI. ?Blood pressure. ?Lipid and cholesterol levels. These may be checked every 5 years, or more frequently if you are over 66 years old. ?Skin check. ?Lung cancer screening. You may have this screening every year starting at age 15 if you have a 30-pack-year history of smoking and currently smoke or have quit within the past 15 years. ?Fecal occult blood test (FOBT) of the stool. You may have this test every year starting at age 65. ?Flexible sigmoidoscopy or colonoscopy. You may have a sigmoidoscopy every 5 years or a colonoscopy every 10 years starting at age 96. ?Prostate cancer screening. Recommendations will vary depending on your family history and other risks. ?Hepatitis C blood test. ?Hepatitis B blood test. ?Sexually transmitted disease (STD) testing. ?Diabetes screening. This is done by checking your blood sugar (glucose) after you have not eaten for a while (fasting). You may have this done every 1-3 years. ?Abdominal aortic aneurysm (AAA) screening. You may need this if you are a current or former smoker. ?Osteoporosis. You may be screened starting at age 30 if you are at high risk. ?Talk with your health care provider about your test results, treatment options, and if necessary, the need for more tests. ?Vaccines  ?Your health care provider may recommend certain vaccines, such as: ?Influenza vaccine. This is recommended every year. ?Tetanus, diphtheria, and acellular pertussis (Tdap, Td) vaccine. You may need a Td booster every 10 years. ?Zoster vaccine. You may need this after age 20. ?Pneumococcal 13-valent conjugate (PCV13) vaccine. One dose is recommended  after age 49. ?Pneumococcal  polysaccharide (PPSV23) vaccine. One dose is recommended after age 77. ?Talk to your health care provider about which screenings and vaccines you need and how often you need them. ?This information is not intended to replace advice given to you by your health care provider. Make sure you discuss any questions you have with your health care provider. ?Document Released: 09/07/2015 Document Revised: 04/30/2016 Document Reviewed: 06/12/2015 ?Elsevier Interactive Patient Education ? 2017 [employer]. ? ?Fall Prevention in the Home ?Falls can cause injuries. They can happen to people of all ages. There are many things you can do to make your home safe and to help prevent falls. ?What can I do on the outside of my home? ?Regularly fix the edges of walkways and driveways and fix any cracks. ?Remove anything that might make you trip as you walk through a door, such as a raised step or threshold. ?Trim any bushes or trees on the path to your home. ?Use bright outdoor lighting. ?Clear any walking paths of anything that might make someone trip, such as rocks or tools. ?Regularly check to see if handrails are loose or broken. Make sure that both sides of any steps have handrails. ?Any raised decks and porches should have guardrails on the edges. ?Have any leaves, snow, or ice cleared regularly. ?Use sand or salt on walking paths during winter. ?Clean up any spills in your garage right away. This includes oil or grease spills. ?What can I do in the bathroom? ?Use night lights. ?Install grab bars by the toilet and in the tub and shower. Do not use towel bars as grab bars. ?Use non-skid mats or decals in the tub or shower. ?If you need to sit down in the shower, use a plastic, non-slip stool. ?Keep the floor dry. Clean up any water that spills on the floor as soon as it happens. ?Remove soap buildup in the tub or shower regularly. ?Attach bath mats securely with double-sided non-slip rug tape. ?Do not have throw rugs and other  things on the floor that can make you trip. ?What can I do in the bedroom? ?Use night lights. ?Make sure that you have a light by your bed that is easy to reach. ?Do not use any sheets or blankets that are too big for your bed. They should not hang down onto the floor. ?Have a firm chair that has side arms. You can use this for support while you get dressed. ?Do not have throw rugs and other things on the floor that can make you trip. ?What can I do in the kitchen? ?Clean up any spills right away. ?Avoid walking on wet floors. ?Keep items that you use a lot in easy-to-reach places. ?If you need to reach something above you, use a strong step stool that has a grab bar. ?Keep electrical cords out of the way. ?Do not use floor polish or wax that makes floors slippery. If you must use wax, use non-skid floor wax. ?Do not have throw rugs and other things on the floor that can make you trip. ?What can I do with my stairs? ?Do not leave any items on the stairs. ?Make sure that there are handrails on both sides of the stairs and use them. Fix handrails that are broken or loose. Make sure that handrails are as long as the stairways. ?Check any carpeting to make sure that it is firmly attached to the stairs. Fix any carpet that is loose or worn. ?Avoid having throw  rugs at the top or bottom of the stairs. If you do have throw rugs, attach them to the floor with carpet tape. ?Make sure that you have a light switch at the top of the stairs and the bottom of the stairs. If you do not have them, ask someone to add them for you. ?What else can I do to help prevent falls? ?Wear shoes that: ?Do not have high heels. ?Have rubber bottoms. ?Are comfortable and fit you well. ?Are closed at the toe. Do not wear sandals. ?If you use a stepladder: ?Make sure that it is fully opened. Do not climb a closed stepladder. ?Make sure that both sides of the stepladder are locked into place. ?Ask someone to hold it for you, if possible. ?Clearly  mark and make sure that you can see: ?Any grab bars or handrails. ?First and last steps. ?Where the edge of each step is. ?Use tools that help you move around (mobility aids) if they are needed. These include

## 2021-12-31 NOTE — Progress Notes (Signed)
? ?Subjective:  ? Jason Farmer is a 87 y.o. male who presents for Medicare Annual/Subsequent preventive examination. ? ?Review of Systems    ? ?Cardiac Risk Factors include: advanced age (>35mn, >>19women);dyslipidemia;hypertension;male gender ? ?   ?Objective:  ?  ?Today's Vitals  ? 12/31/21 1411  ?BP: 136/78  ?Pulse: 100  ?Temp: 98.2 ?F (36.8 ?C)  ?TempSrc: Oral  ?SpO2: 98%  ?Weight: 131 lb 11.2 oz (59.7 kg)  ?Height: '5\' 7"'$  (1.702 m)  ? ?Body mass index is 20.63 kg/m?. ? ? ?  12/31/2021  ?  2:20 PM 04/01/2021  ?  1:30 PM 12/25/2020  ?  2:01 PM 08/04/2018  ?  6:40 AM 12/23/2017  ?  1:53 PM 11/27/2016  ? 10:50 PM 12/18/2015  ?  2:09 PM  ?Advanced Directives  ?Does Patient Have a Medical Advance Directive? Yes No Yes No Yes Yes Yes  ?Type of [place]aramed[place]of [place] will  Living will   Living will [place]  ?Does patient want to make changes to medical advance directive?      No - Patient declined No - Patient declined  ?Copy of [place] Chart? No - copy requested      No - copy requested  ?Would patient like information on creating a medical advance directive?  No - Patient declined  No - Patient declined     ? ? ?Current Medications (verified) ?Outpatient Encounter Medications as of 12/31/2021  ?Medication Sig  ? acetaminophen (TYLENOL) 325 MG tablet Take 325 mg by mouth every 6 (six) hours as needed for moderate pain or headache.  ? atenolol (TENORMIN) 25 MG tablet Take 0.5 tablets (12.5 mg total) by mouth daily.  ? azelastine (ASTELIN) 0.1 % nasal spray Place 2 sprays into both nostrils at bedtime. Use in each nostril as directed  ? Cholecalciferol (VITAMIN D3) 125 MCG (5000 UT) CAPS Take 5,000 Units by mouth daily.  ? escitalopram (LEXAPRO) 10 MG tablet TAKE ONE TABLET BY MOUTH ONCE DAILY  ? ezetimibe (ZETIA) 10 MG tablet TAKE 1 TABLET EACH DAY. (Patient taking differently: Take 10 mg by mouth daily.)  ? latanoprost (XALATAN) 0.005 %  ophthalmic solution Place 1 drop into both eyes at bedtime.   ? mirtazapine (REMERON) 15 MG tablet TAKE ONE TABLET BY MOUTH AT BEDTIME  ? omeprazole (PRILOSEC) 20 MG capsule TAKE ONE CAPSULE BY MOUTH ONCE DAILY  ? simvastatin (ZOCOR) 10 MG tablet Take 1 tablet (10 mg total) by mouth every other day.  ? ?No facility-administered encounter medications on file as of 12/31/2021.  ? ? ?Allergies (verified) ?Statins and Aspirin  ? ?History: ?Past Medical History:  ?Diagnosis Date  ? Anxiety   ? "not a problem for a long time" (11/27/2016)  ? Arthritis   ? "wrists, knees, shoulders, back" (11/27/2016)  ? Chronic lower back pain   ? "5th disc is protruding" (11/27/2016)  ? Essential tremor   ? GERD (gastroesophageal reflux disease)   ? Glaucoma   ? Heart murmur   ? dx'd in [employer] in the 1960s; haven't been seen for it since "(11/27/2016)  ? Hepatitis ~ 1958  ? "jaundice kind"  ? HOH (hard of hearing)   ? Hyperlipemia   ? Migraine   ? "none since my neck OR" (11/27/2016)  ? Seasonal allergies   ? TIA (transient ischemic attack) 04/2015  ? Wears glasses   ? Wears hearing aid   ? ?Past Surgical History:  ?  Procedure Laterality Date  ? ANTERIOR CERVICAL DECOMP/DISCECTOMY FUSION  2007  ? APPENDECTOMY    ? BACK SURGERY    ? CARPAL TUNNEL RELEASE Right 10/12/2013  ? Procedure: RIGHT CARPAL TUNNEL RELEASE;  Surgeon: Wynonia Sours, MD;  Location: [facility];  Service: Orthopedics;  Laterality: Right;  ? CATARACT EXTRACTION W/ INTRAOCULAR LENS  IMPLANT, BILATERAL Bilateral   ? COLONOSCOPY    ? HEMORRHOID SURGERY    ? KNEE ARTHROSCOPY Left 1970s  ? NASAL SEPTOPLASTY W/ TURBINOPLASTY Bilateral 02/15/2013  ? Procedure: NASAL SEPTOPLASTY WITH BILATER TURBINATE REDUCTION;  Surgeon: Ascencion Dike, MD;  Location: [facility];  Service: ENT;  Laterality: Bilateral;  ? TONSILLECTOMY    ? TRIGGER FINGER RELEASE  06/08/2012  ? Procedure: RELEASE TRIGGER FINGER/A-1 PULLEY;  Surgeon: Wynonia Sours, MD;  Location: [facility];   Service: Orthopedics;  Laterality: Left;  Release A-1 Pulley Left Long Finger  ? TRIGGER FINGER RELEASE  07/14/2012  ? Procedure: RELEASE TRIGGER FINGER/A-1 PULLEY;  Surgeon: Wynonia Sours, MD;  Location: [facility];  Service: Orthopedics;  Laterality: Right;  ? TRIGGER FINGER RELEASE Right 10/12/2013  ? Procedure: RELEASE TRIGGER FINGER/A-1 PULLEY RIGHT MIDDLE FINGER;  Surgeon: Wynonia Sours, MD;  Location: [facility];  Service: Orthopedics;  Laterality: Right;  ? UPPER GASTROINTESTINAL ENDOSCOPY    ? ?Family History  ?Problem Relation Age of Onset  ? Dementia Father   ? Heart failure Father   ? Glaucoma Mother   ? Colon cancer Neg Hx   ? ?Social History  ? ?Socioeconomic History  ? Marital status: Married  ?  Spouse name: Not on file  ? Number of children: 4  ? Years of education: Not on file  ? Highest education level: Some college, no degree  ?Occupational History  ? Not on file  ?Tobacco Use  ? Smoking status: Former  ?  Packs/day: 2.00  ?  Years: 30.00  ?  Pack years: 60.00  ?  Types: Cigarettes  ?  Quit date: 06/03/1984  ?  Years since quitting: 37.6  ? Smokeless tobacco: Never  ? Tobacco comments:  ?  had AAA at the [place]; also noted 2018 on Dr. Kyla Balzarine note   ?Vaping Use  ? Vaping Use: Never used  ?Substance and Sexual Activity  ? Alcohol use: No  ?  Comment: 11/27/2016 "quit in 1985"  ? Drug use: No  ? Sexual activity: Not Currently  ?Other Topics Concern  ? Not on file  ?Social History Narrative  ? Retired from [employer]  ? Married   ? 4 children, one son lives locally  ? ?Social Determinants of Health  ? ?Financial Resource Strain: Low Risk   ? Difficulty of Paying Living Expenses: Not hard at all  ?Food Insecurity: No Food Insecurity  ? Worried About Charity fundraiser in the Last Year: Never true  ? Ran Out of Food in the Last Year: Never true  ?Transportation Needs: No Transportation Needs  ? Lack of Transportation (Medical): No  ? Lack of Transportation (Non-Medical): No   ?Physical Activity: Inactive  ? Days of Exercise per Week: 0 days  ? Minutes of Exercise per Session: 0 min  ?Stress: No Stress Concern Present  ? Feeling of Stress : Not at all  ?Social Connections: Moderately Integrated  ? Frequency of Communication with Friends and Family: Three times a week  ? Frequency of Social Gatherings with Friends and  Family: Once a week  ? Attends Religious Services: Never  ? Active Member of Clubs or Organizations: Yes  ? Attends Archivist Meetings: More than 4 times per year  ? Marital Status: Married  ? ? ?Tobacco Counseling ?Counseling given: Not Answered ?Tobacco comments: had AAA at the [place]; also noted 2018 on Dr. Kyla Balzarine note  ? ? ?Clinical Intake: ? ?Pre-visit preparation completed: Yes ? ?Pain : No/denies pain ? ?  ? ?Nutritional Status: BMI of 19-24  Normal ?Nutritional Risks: None ?Diabetes: No ? 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 ?Diabetic? no ? ?Interpreter Needed?: No ? ?Information entered by :: NAllen LPN ? ? ?Activities of Daily Living ? ?  12/31/2021  ?  2:24 PM  ?In your present state of health, do you have any difficulty performing the following activities:  ?Hearing? 0  ?Comment has hearing aids  ?Vision? 0  ?Difficulty concentrating or making decisions? 1  ?Comment more forgetfulness  ?Walking or climbing stairs? 0  ?Dressing or bathing? 0  ?Doing errands, shopping? 0  ?Preparing Food and eating ? N  ?Using the Toilet? N  ?In the past six months, have you accidently leaked urine? N  ?Do you have problems with loss of bowel control? N  ?Managing your Medications? N  ?Managing your Finances? N  ?Housekeeping or managing your Housekeeping? N  ? ? ?Patient Care Team: ?NDorothyann Peng NP as PCP - General (Family Medicine) ?NRozetta Nunnery MD (Inactive) as Consulting  Physician (Otolaryngology) ?[facility] as CSet designer(Orthopedic Surgery) ?PViona Gilmore RPsych[facility] Institute [facility] Washingtonas Pharmacist (Pharmacist) ? ?Indicate any recent Medical Services you may have received from

## 2022-01-17 ENCOUNTER — Other Ambulatory Visit: Payer: Self-pay | Admitting: Adult Health

## 2022-01-17 DIAGNOSIS — F419 Anxiety disorder, unspecified: Secondary | ICD-10-CM

## 2022-01-17 DIAGNOSIS — R634 Abnormal weight loss: Secondary | ICD-10-CM

## 2022-02-12 DIAGNOSIS — M7542 Impingement syndrome of left shoulder: Secondary | ICD-10-CM | POA: Diagnosis not present

## 2022-02-12 DIAGNOSIS — M25811 Other specified joint disorders, right shoulder: Secondary | ICD-10-CM | POA: Diagnosis not present

## 2022-02-12 DIAGNOSIS — M7541 Impingement syndrome of right shoulder: Secondary | ICD-10-CM | POA: Diagnosis not present

## 2022-02-12 DIAGNOSIS — M25812 Other specified joint disorders, left shoulder: Secondary | ICD-10-CM | POA: Diagnosis not present

## 2022-03-11 ENCOUNTER — Other Ambulatory Visit: Payer: Self-pay | Admitting: Adult Health

## 2022-03-11 ENCOUNTER — Telehealth: Payer: Self-pay | Admitting: Pharmacist

## 2022-03-11 DIAGNOSIS — I10 Essential (primary) hypertension: Secondary | ICD-10-CM

## 2022-03-11 DIAGNOSIS — E785 Hyperlipidemia, unspecified: Secondary | ICD-10-CM

## 2022-03-11 NOTE — Chronic Care Management (AMB) (Signed)
Chronic Care Management Pharmacy Assistant   Name: Jason Farmer  MRN: [id number] DOB: 1935/01/16  Reason for Encounter: Disease State / Hypertension Assessment Call   Conditions to be addressed/monitored: HTN  Recent office visits:  12/31/2021 Glenna Durand LPN - Encounter for Medicare annual wellness exam  12/19/2021 Dorothyann Peng NP - Patient was seen for muscle cramps and an additional issue. Discontinued Flexeril. No follow up noted.   Recent consult visits:  None  Hospital visits:  None  Medications: Outpatient Encounter Medications as of 03/11/2022  Medication Sig   acetaminophen (TYLENOL) 325 MG tablet Take 325 mg by mouth every 6 (six) hours as needed for moderate pain or headache.   atenolol (TENORMIN) 25 MG tablet Take 0.5 tablets (12.5 mg total) by mouth daily.   azelastine (ASTELIN) 0.1 % nasal spray Place 2 sprays into both nostrils at bedtime. Use in each nostril as directed   Cholecalciferol (VITAMIN D3) 125 MCG (5000 UT) CAPS Take 5,000 Units by mouth daily.   escitalopram (LEXAPRO) 10 MG tablet TAKE ONE TABLET BY MOUTH ONCE DAILY   ezetimibe (ZETIA) 10 MG tablet TAKE 1 TABLET EACH DAY. (Patient taking differently: Take 10 mg by mouth daily.)   latanoprost (XALATAN) 0.005 % ophthalmic solution Place 1 drop into both eyes at bedtime.    mirtazapine (REMERON) 15 MG tablet TAKE ONE TABLET BY MOUTH AT BEDTIME   omeprazole (PRILOSEC) 20 MG capsule TAKE ONE CAPSULE BY MOUTH ONCE DAILY   simvastatin (ZOCOR) 10 MG tablet Take 1 tablet (10 mg total) by mouth every other day.   No facility-administered encounter medications on file as of 03/11/2022.  Fill History: atenolol 25 mg tablet 02/26/2022 90   cyclobenzaprine 5 mg tablet 11/28/2021 15   escitalopram 10 mg tablet 12/19/2021 90   mirtazapine 15 mg tablet 01/21/2022 90   latanoprost 0.005 % eye drops 02/27/2022 25   omeprazole 20 mg capsule,delayed release 12/13/2021 90   simvastatin 10 mg tablet  06/11/2021 180   Reviewed chart prior to disease state call. Spoke with patient regarding BP  Recent Office Vitals: BP Readings from Last 3 Encounters:  12/31/21 136/78  12/19/21 122/82  11/28/21 (!) 144/81   Pulse Readings from Last 3 Encounters:  12/31/21 100  12/19/21 86  11/28/21 89    Wt Readings from Last 3 Encounters:  12/31/21 131 lb 11.2 oz (59.7 kg)  12/19/21 132 lb (59.9 kg)  11/28/21 132 lb 3.2 oz (60 kg)     Kidney Function Lab Results  Component Value Date/Time   CREATININE 1.36 12/19/2021 07:57 AM   CREATININE 1.59 (H) 11/28/2021 03:49 PM   CREATININE 1.19 04/02/2021 12:40 AM   CREATININE 1.46 (H) 03/16/2020 03:06 PM   GFR 47.74 (L) 12/19/2021 07:57 AM   GFRNONAA >60 04/02/2021 12:40 AM   GFRNONAA 44 (L) 03/16/2020 03:06 PM   GFRAA 51 (L) 03/16/2020 03:06 PM       Latest Ref Rng & Units 12/19/2021    7:57 AM 11/28/2021    3:49 PM 04/02/2021   12:40 AM  BMP  Glucose 70 - 99 mg/dL 143  98  83   BUN 6 - 23 mg/dL '13  26  15   '$ Creatinine 0.40 - 1.50 mg/dL 1.36  1.59  1.19   BUN/Creat Ratio 6 - 22 (calc)  16    Sodium 135 - 145 mEq/L 138  136  135   Potassium 3.5 - 5.1 mEq/L 4.5  4.7  3.7   Chloride  96 - 112 mEq/L 103  101  101   CO2 19 - 32 mEq/L '28  26  23   '$ Calcium 8.4 - 10.5 mg/dL 9.6  9.8  9.0     Current antihypertensive regimen:  Atenolol 25 mg 1/2 tablet daily  How often are you checking your Blood Pressure? Patient checks his blood pressure very infrequently, he states he can tell when it is elevated and he will check it at those times. Patient is aware he needs to check his blood pressure more often.   Current home BP readings: Patient does not have any recent readings  What recent interventions/DTPs have been made by any provider to improve Blood Pressure control since last CPP Visit: No recent interventions  Any recent hospitalizations or ED visits since last visit with CPP? No recent hospital visits  What diet changes have been made to  improve Blood Pressure Control?  Patient follows no specific diet Breakfast - patient will have eggs with sausage, cerea, creamed beef over toast.  Lunch - patient will have a salad most days Dinner - patient will have a variety of dinner meals, he cooks and eats what his wife wants for dinner.   What exercise is being done to improve your Blood Pressure Control?  Patient is walking twice daily and does his own yard work.   Adherence Review: Is the patient currently on ACE/ARB medication? No Does the patient have >5 day gap between last estimated fill dates? No   Care Gaps: AWV - completed 12/31/2021 Last BP - 122/82 on 12/19/2021 Shingrix - never done Covid - overdue  Star Rating Drugs: Simvastatin 10 mg - last filled 06/11/2021 180 DS at [facility]. Verified with pharmacist  [place] Pharmacist Assistant [phone]

## 2022-03-12 ENCOUNTER — Other Ambulatory Visit: Payer: Self-pay | Admitting: Adult Health

## 2022-03-12 DIAGNOSIS — K219 Gastro-esophageal reflux disease without esophagitis: Secondary | ICD-10-CM

## 2022-03-20 ENCOUNTER — Ambulatory Visit (INDEPENDENT_AMBULATORY_CARE_PROVIDER_SITE_OTHER): Payer: Medicare Other | Admitting: Adult Health

## 2022-03-20 ENCOUNTER — Encounter: Payer: Self-pay | Admitting: Adult Health

## 2022-03-20 VITALS — BP 130/80 | HR 72 | Temp 98.1°F | Ht 67.0 in | Wt 129.0 lb

## 2022-03-20 DIAGNOSIS — E860 Dehydration: Secondary | ICD-10-CM

## 2022-03-20 DIAGNOSIS — R42 Dizziness and giddiness: Secondary | ICD-10-CM | POA: Diagnosis not present

## 2022-03-20 NOTE — Progress Notes (Signed)
Subjective:    Patient ID: Jason Farmer, male    DOB: 05/14/1935, 87 y.o.   MRN: [id number]  HPI 87 year old male who  has a past medical history of Anxiety, Arthritis, Chronic lower back pain, Essential tremor, GERD (gastroesophageal reflux disease), Glaucoma, Heart murmur, Hepatitis (~ 1958), HOH (hard of hearing), Hyperlipemia, Migraine, Seasonal allergies, TIA (transient ischemic attack) (04/2015), Wears glasses, and Wears hearing aid.  He presents to the office today for an acute issue of dizziness, feeling wheezy and exhausted. He reports that this happened yesterday when he was out playing golf and it was 96 degrees outside. He has also noticed these symptoms when he goes outside of walk his dogs.   He is drinking about 40 oz of water a day.    Review of Systems See HPI   Past Medical History:  Diagnosis Date   Anxiety    "not a problem for a long time" (11/27/2016)   Arthritis    "wrists, knees, shoulders, back" (11/27/2016)   Chronic lower back pain    "5th disc is protruding" (11/27/2016)   Essential tremor    GERD (gastroesophageal reflux disease)    Glaucoma    Heart murmur    dx'd in [employer] in the 1960s; haven't been seen for it since "(11/27/2016)   Hepatitis ~ 1958   "jaundice kind"   HOH (hard of hearing)    Hyperlipemia    Migraine    "none since my neck OR" (11/27/2016)   Seasonal allergies    TIA (transient ischemic attack) 04/2015   Wears glasses    Wears hearing aid     Social History   Socioeconomic History   Marital status: Married    Spouse name: Not on file   Number of children: 4   Years of education: Not on file   Highest education level: Some college, no degree  Occupational History   Not on file  Tobacco Use   Smoking status: Former    Packs/day: 2.00    Years: 30.00    Total pack years: 60.00    Types: Cigarettes    Quit date: 06/03/1984    Years since quitting: 37.8   Smokeless tobacco: Never   Tobacco comments:    had AAA at the [place];  also noted 2018 on Dr. Kyla Balzarine note   Vaping Use   Vaping Use: Never used  Substance and Sexual Activity   Alcohol use: No    Comment: 11/27/2016 "quit in 1985"   Drug use: No   Sexual activity: Not Currently  Other Topics Concern   Not on file  Social History Narrative   Retired from [employer]   Married    4 children, one son lives locally   Social Determinants of Health   Financial Resource Strain: [place]  (12/31/2021)   Overall Financial Resource Strain (CARDIA)    Difficulty of Paying Living Expenses: Not hard at all  Food Insecurity: No Food Insecurity (12/31/2021)   Hunger Vital Sign    Worried About Running Out of Food in the Last Year: Never true    Ran Out of Food in the Last Year: Never true  Transportation Needs: No Transportation Needs (12/31/2021)   PRAPARE - Hydrologist (Medical): No    Lack of Transportation (Non-Medical): No  Physical Activity: Inactive (12/31/2021)   Exercise Vital Sign    Days of Exercise per Week: 0 days    Minutes of Exercise per  Session: 0 min  Stress: No Stress Concern Present (12/31/2021)   [place]    Feeling of Stress : Not at all  Recent Concern: Stress - Stress Concern Present (11/28/2021)   [place]    Feeling of Stress : Rather much  Social Connections: Moderately Integrated (11/28/2021)   Social Connection and Isolation Panel [NHANES]    Frequency of Communication with Friends and Family: Three times a week    Frequency of Social Gatherings with Friends and Family: Once a week    Attends Religious Services: Never    Marine scientist or Organizations: Yes    Attends Music therapist: More than 4 times per year    Marital Status: Married  Human resources officer Violence: Not At Risk (12/25/2020)   Humiliation, Afraid, Rape, and Kick questionnaire    Fear of  Current or Ex-Partner: No    Emotionally Abused: No    Physically Abused: No    Sexually Abused: No    Past Surgical History:  Procedure Laterality Date   ANTERIOR CERVICAL DECOMP/DISCECTOMY FUSION  2007   APPENDECTOMY     BACK SURGERY     CARPAL TUNNEL RELEASE Right 10/12/2013   Procedure: RIGHT CARPAL TUNNEL RELEASE;  Surgeon: Wynonia Sours, MD;  Location: [facility];  Service: Orthopedics;  Laterality: Right;   CATARACT EXTRACTION W/ INTRAOCULAR LENS  IMPLANT, BILATERAL Bilateral    COLONOSCOPY     HEMORRHOID SURGERY     KNEE ARTHROSCOPY Left 1970s   NASAL SEPTOPLASTY W/ TURBINOPLASTY Bilateral 02/15/2013   Procedure: NASAL SEPTOPLASTY WITH BILATER TURBINATE REDUCTION;  Surgeon: Ascencion Dike, MD;  Location: [facility];  Service: ENT;  Laterality: Bilateral;   TONSILLECTOMY     TRIGGER FINGER RELEASE  06/08/2012   Procedure: RELEASE TRIGGER FINGER/A-1 PULLEY;  Surgeon: Wynonia Sours, MD;  Location: [facility];  Service: Orthopedics;  Laterality: Left;  Release A-1 Pulley Left Long Finger   TRIGGER FINGER RELEASE  07/14/2012   Procedure: RELEASE TRIGGER FINGER/A-1 PULLEY;  Surgeon: Wynonia Sours, MD;  Location: [facility];  Service: Orthopedics;  Laterality: Right;   TRIGGER FINGER RELEASE Right 10/12/2013   Procedure: RELEASE TRIGGER FINGER/A-1 PULLEY RIGHT MIDDLE FINGER;  Surgeon: Wynonia Sours, MD;  Location: [facility];  Service: Orthopedics;  Laterality: Right;   UPPER GASTROINTESTINAL ENDOSCOPY      Family History  Problem Relation Age of Onset   Dementia Father    Heart failure Father    Glaucoma Mother    Colon cancer Neg Hx     Allergies  Allergen Reactions   Statins Other (See Comments)    Muscle cramps    Aspirin Other (See Comments)    bleeding    Current Outpatient Medications on File Prior to Visit  Medication Sig Dispense Refill   acetaminophen (TYLENOL) 325 MG tablet Take 325 mg by mouth  every 6 (six) hours as needed for moderate pain or headache.     atenolol (TENORMIN) 25 MG tablet Take 0.5 tablets (12.5 mg total) by mouth daily. 90 tablet 1   azelastine (ASTELIN) 0.1 % nasal spray Place 2 sprays into both nostrils at bedtime. Use in each nostril as directed     Cholecalciferol (VITAMIN D3) 125 MCG (5000 UT) CAPS Take 5,000 Units by mouth daily.     escitalopram (LEXAPRO) 10 MG tablet TAKE  ONE TABLET BY MOUTH ONCE DAILY 90 tablet 0   ezetimibe (ZETIA) 10 MG tablet TAKE 1 TABLET EACH DAY. (Patient taking differently: Take 10 mg by mouth daily.) 90 tablet 0   latanoprost (XALATAN) 0.005 % ophthalmic solution Place 1 drop into both eyes at bedtime.      mirtazapine (REMERON) 15 MG tablet TAKE ONE TABLET BY MOUTH AT BEDTIME 90 tablet 0   omeprazole (PRILOSEC) 20 MG capsule TAKE ONE CAPSULE BY MOUTH ONCE DAILY 90 capsule 0   simvastatin (ZOCOR) 10 MG tablet Take 1 tablet (10 mg total) by mouth every other day. 90 tablet 1   No current facility-administered medications on file prior to visit.    BP 130/80   Pulse 72   Temp 98.1 F (36.7 C) (Oral)   Ht '5\' 7"'$  (1.702 m)   Wt 129 lb (58.5 kg)   SpO2 98%   BMI 20.20 kg/m       Objective:   Physical Exam Vitals and nursing note reviewed.  Constitutional:      Appearance: Normal appearance.  Cardiovascular:     Rate and Rhythm: Normal rate and regular rhythm.     Pulses: Normal pulses.     Heart sounds: Normal heart sounds.  Pulmonary:     Effort: Pulmonary effort is normal.     Breath sounds: Normal breath sounds.  Musculoskeletal:        General: Normal range of motion.  Skin:    General: Skin is warm and dry.  Neurological:     General: No focal deficit present.     Mental Status: He is alert and oriented to person, place, and time.  Psychiatric:        Mood and Affect: Mood normal.        Behavior: Behavior normal.        Thought Content: Thought content normal.        Judgment: Judgment normal.        Assessment & Plan:  1. Dizziness - from dehydration  - encouraged to increase his water intake and shoot for about 100 oz total a day, more if he is outside.  - Do not go outside when it is this hot. If he does have to go out side do it early in the morning or in the evening  - CBC with Differential/Platelet; Future - Basic Metabolic Panel; Future - Urinalysis; Future  2. Dehydration  - CBC with Differential/Platelet; Future - Basic Metabolic Panel; Future - Urinalysis; Future  Dorothyann Peng, NP

## 2022-03-20 NOTE — Patient Instructions (Addendum)
You need to double your water intake, you can supplement with Gatorade, shoot for about 100 oz a day.   You would be better off not walking during the day while it is this hot out. If you have to walk then I only want you walking once a day either in the morning or evening.   We will check some labs on you today

## 2022-03-21 LAB — CBC WITH DIFFERENTIAL/PLATELET
Basophils Absolute: 0.1 10*3/uL (ref 0.0–0.1)
Basophils Relative: 1.2 % (ref 0.0–3.0)
Eosinophils Absolute: 0.1 10*3/uL (ref 0.0–0.7)
Eosinophils Relative: 0.7 % (ref 0.0–5.0)
HCT: 41.9 % (ref 39.0–52.0)
Hemoglobin: 13.9 g/dL (ref 13.0–17.0)
Lymphocytes Relative: 24.3 % (ref 12.0–46.0)
Lymphs Abs: 2.2 10*3/uL (ref 0.7–4.0)
MCHC: 33.1 g/dL (ref 30.0–36.0)
MCV: 93.1 fl (ref 78.0–100.0)
Monocytes Absolute: 0.8 10*3/uL (ref 0.1–1.0)
Monocytes Relative: 8.5 % (ref 3.0–12.0)
Neutro Abs: 6 10*3/uL (ref 1.4–7.7)
Neutrophils Relative %: 65.3 % (ref 43.0–77.0)
Platelets: 291 10*3/uL (ref 150.0–400.0)
RBC: 4.5 Mil/uL (ref 4.22–5.81)
RDW: 13.9 % (ref 11.5–15.5)
WBC: 9.2 10*3/uL (ref 4.0–10.5)

## 2022-03-21 LAB — URINALYSIS, ROUTINE W REFLEX MICROSCOPIC
Bilirubin Urine: NEGATIVE
Ketones, ur: NEGATIVE
Leukocytes,Ua: NEGATIVE
Nitrite: NEGATIVE
Specific Gravity, Urine: 1.02 (ref 1.000–1.030)
Urine Glucose: NEGATIVE
Urobilinogen, UA: 0.2 (ref 0.0–1.0)
pH: 5.5 (ref 5.0–8.0)

## 2022-03-21 LAB — BASIC METABOLIC PANEL
BUN: 23 mg/dL (ref 6–23)
CO2: 28 mEq/L (ref 19–32)
Calcium: 9.5 mg/dL (ref 8.4–10.5)
Chloride: 102 mEq/L (ref 96–112)
Creatinine, Ser: 1.15 mg/dL (ref 0.40–1.50)
GFR: 58.28 mL/min — ABNORMAL LOW (ref >60.00)
Glucose, Bld: 80 mg/dL (ref 70–99)
Potassium: 5.1 mEq/L (ref 3.5–5.1)
Sodium: 136 mEq/L (ref 135–145)

## 2022-04-30 ENCOUNTER — Other Ambulatory Visit: Payer: Self-pay | Admitting: Adult Health

## 2022-04-30 DIAGNOSIS — F419 Anxiety disorder, unspecified: Secondary | ICD-10-CM

## 2022-04-30 DIAGNOSIS — R634 Abnormal weight loss: Secondary | ICD-10-CM

## 2022-06-06 ENCOUNTER — Telehealth: Payer: Self-pay | Admitting: Pharmacist

## 2022-06-06 NOTE — Chronic Care Management (AMB) (Signed)
    Chronic Care Management Pharmacy Assistant   Name: Jason Farmer  MRN: [id number] DOB: [birth year]  05/10/2022 APPOINTMENT REMINDER  Called [name], No answer, left message of appointment on 06/09/2022 at 1:00 via telephone visit with [name], Pharm D. Notified to have all medications, supplements, blood pressure and/or blood sugar logs available during appointment and to return call if need to reschedule.  Care Gaps: AWV - completed 12/31/2021 Last BP - 130/80 on 03/20/2022 Singrix - never done Covid - overdue Flu - due  Star Rating Drug: Simvastatin 10 mg - last filled 06/11/2021 180 DS at [facility]. Verified with pharmacist (patient is taking this medication every other day)  Any gaps in medications fill history? No  [name] [facility]  Catering manager [phone]

## 2022-06-09 ENCOUNTER — Ambulatory Visit (INDEPENDENT_AMBULATORY_CARE_PROVIDER_SITE_OTHER): Payer: Medicare Other | Admitting: Pharmacist

## 2022-06-09 DIAGNOSIS — I10 Essential (primary) hypertension: Secondary | ICD-10-CM

## 2022-06-09 DIAGNOSIS — E785 Hyperlipidemia, unspecified: Secondary | ICD-10-CM

## 2022-06-09 NOTE — Patient Instructions (Signed)
Hi Jason Farmer,  It was great to get to catch up with you again! Please make sure to start back on the simvastatin.   Please reach out to me if you have any questions or need anything before our follow up!  Best, Maddie  Jason Farmer, PharmD, [facility]  Visit Information   Goals Addressed   None    Patient Care Plan: CCM Pharmacy Care Plan     Problem Identified: Problem: Hypertension, Hyperlipidemia, GERD, Depression, Allergic Rhinitis and ocular migraine, glaucoma, and vitamin D deficiency      Long-Range Goal: Patient-Specific Goal   Start Date: 10/15/2020  Expected End Date: 10/15/2021  Recent Progress: On track  Priority: High  Note:   Current Barriers:  Unable to achieve control of cholesterol  Unable to self administer medications as prescribed  Pharmacist Clinical Goal(s):  Patient will achieve adherence to monitoring guidelines and medication adherence to achieve therapeutic efficacy achieve control of cholesterol as evidenced by next lipid panel  through collaboration with PharmD and provider.   Interventions: 1:1 collaboration with Dorothyann Peng, NP regarding development and update of comprehensive plan of care as evidenced by provider attestation and co-signature Inter-disciplinary care team collaboration (see longitudinal plan of care) Comprehensive medication review performed; medication list updated in electronic medical record  Hypertension (BP goal <140/90) -Controlled -Current treatment: Atenolol 25 mg 1/2 tablet daily - Appropriate, Query effective, Safe, Accessible -Medications previously tried: none -Current home readings: could not provide -Current dietary habits: has cut back on salt a lot and does not eat canned foods; he does admit to looking at package labels for sodium content -Current exercise habits: walks 3-4 miles a day at two different times of the day -Denies  hypotensive/hypertensive symptoms -Educated on Importance of home blood pressure monitoring; -Counseled to monitor BP at home weekly, document, and provide log at future appointments -Recommended to continue current medication  Hyperlipidemia: (LDL goal < 70) -Uncontrolled -Current treatment: Ezetimibe 10 mg 1 tablet daily - Appropriate, Query effective, Safe, Accessible Simvastatin 10 mg 1 tablet every other day - not taking -Medications previously tried:  statins (muscle cramps) -Current dietary patterns: patient eats more chicken than red meat and does not drink alcohol -Current exercise habits:  walks 3-4 miles a day at two different times of the day -Educated on Cholesterol goals;  Benefits of statin for ASCVD risk reduction; -Recommended to continue current medication  Depression/Insomnia (Goal: minimize symptoms of depression and improve quality and quantity of sleep) -Controlled -Current treatment: Mirtazapine 15 mg 1 tablet at bedtime - Appropriate, Effective, Safe, Accessible Escitalopram 10 mg 1 tablet daily - Appropriate, Effective, Safe, Accessible -Medications previously tried/failed: n/a -PHQ9: 10 -Educated on Benefits of medication for symptom control -Recommended to continue current medication  Ocular migraine (Goal: minimize symptoms) -Controlled -Current treatment  None -Medications previously tried: amitriptyline (cognitive impairment) -Recommended to continue current medication  GERD/dysphagia (Goal: minimize symptoms of heartburn or acid reflux) -Controlled -Current treatment  Omeprazole 20 mg 1 capsule daily - Appropriate, Effective, Safe, Accessible -Medications previously tried: none  -Recommended to continue current medication Recommended ENT referral  Vitamin D deficiency (Goal: vitamin D 30-100) -Controlled -Current treatment  No medications -Medications previously tried: vitamin D -Recommended to continue current medication Counseled on  drinking plenty of water  Allergic rhinitis (Goal: minimize symptoms of allergies) -Controlled -Current treatment  Atrovent nasal spray PRN - Appropriate, Effective, Safe, Accessible Claritin 10 mg 1 tablet daily    - Appropriate, Effective,  Safe, Accessible -Medications previously tried: montelukast (cognitive impairment) -Recommended to continue current medication  Glaucoma (Goal: lower intraocular pressure) -Controlled -Current treatment  Latanoprost 0.005% 1 drop in both eyes at bedtime - Appropriate, Effective, Safe, Accessible -Medications previously tried: none  -Recommended to continue current medication  Health Maintenance -Vaccine gaps: Shingrix, COVID booster, influenza -Current therapy:  Benefiber daily -Educated on Cost vs benefit of each product must be carefully weighed by individual consumer -Patient is satisfied with current therapy and denies issues -Recommended to continue current medication  Patient Goals/Self-Care Activities Patient will:  - take medications as prescribed check blood pressure weekly, document, and provide at future appointments  Follow Up Plan: The care management team will reach out to the patient again over the next 14 days.         Patient verbalizes understanding of instructions and care plan provided today and agrees to view in [place]. Active MyChart status and patient understanding of how to access instructions and care plan via MyChart confirmed with patient.    The pharmacy team will reach out to the patient again over the next 30 days.   Viona Gilmore, [facility]

## 2022-06-09 NOTE — Progress Notes (Signed)
Chronic Care Management Pharmacy Note  06/09/2022 Name:  Jason Farmer MRN:  [id number] DOB:  1934-10-30  Summary: Patient is unsure of what medications he is taking as his wife manages them Pt was not taking simvastatin LDL significantly elevated  Recommendations/Changes made from today's visit: -Recommended restarting simvastatin -Recommend repeat lipid panel in 2-3 months -Scheduled for annual vaccines -Consider [facility] for adherence packaging  Plan: HLD assessment in 3-4 weeks Follow up in 6 months  Subjective: Jason Farmer is an 87 y.o. year old male who is a primary patient of Dorothyann Peng, NP.  The CCM team was consulted for assistance with disease management and care coordination needs.    Engaged with patient by telephone for follow up visit in response to provider referral for pharmacy case management and/or care coordination services.   Consent to Services:  The patient was given information about Chronic Care Management services, agreed to services, and gave verbal consent prior to initiation of services.  Please see initial visit note for detailed documentation.   Patient Care Team: Dorothyann Peng, NP as PCP - General (Family Medicine) Rozetta Nunnery, MD (Inactive) as Consulting Physician (Otolaryngology) [facility] as Consulting Physician (Orthopedic Surgery) Viona Gilmore, [facility] as Pharmacist (Pharmacist)  Recent office visits: 03/20/22 Dorothyann Peng NP: Patient presented for dizziness.  12/31/2021 Glenna Durand LPN - Encounter for Medicare annual wellness exam   12/19/2021 Dorothyann Peng NP - Patient was seen for muscle cramps. Discontinued Flexeril. Recommended 1-2 tabs of Super B complex.  Recent consult visits: 11/14/21 Victorino December ([facility]): Patient presented for rotator cuff of right and left shoulder follow up. Unable to access notes.  05/17/21 Patient message (neurology): Recommended holding donepezil due to diarrhea and  try again in 1-2 weeks.  05/09/21 Sharene Butters, PA-C (neurology): Patient presented for follow up for mild neurocognitive disorder. Prescribed donepezil 1/2 tablet daily x 2 weeks then increase to 1 full tablet at 10 mg daily.  Hospital visits: Medication Reconciliation was completed by comparing discharge summary, patient's EMR and Pharmacy list, and upon discussion with patient.   Patient presented to [facility] ED on 04-01-2021 due to Cellulitis of left leg. He was there for 21 hours.   New?Medications Started at [facility] Discharge:?? -started  Keflex 500 mg - Take 1 capsule  by mouth 4 (four) times daily for 7 days. NORCO/VICODIN 5-325 MG-Take 1 tablet by mouth every 6 (six) hours as needed.   Medication Changes at Hospital Discharge: -Changed  None   Medications Discontinued at Hospital Discharge: -Stopped  None   Medications that remain the same after Hospital Discharge:??  -All other medications will remain the same.    Objective:  Lab Results  Component Value Date   CREATININE 1.15 03/20/2022   BUN 23 03/20/2022   GFR 58.28 (L) 03/20/2022   GFRNONAA >60 04/02/2021   GFRAA 51 (L) 03/16/2020   NA 136 03/20/2022   K 5.1 03/20/2022   CALCIUM 9.5 03/20/2022   CO2 28 03/20/2022    Lab Results  Component Value Date/Time   GFR 58.28 (L) 03/20/2022 03:11 PM   GFR 47.74 (L) 12/19/2021 07:57 AM    Last diabetic Eye exam: No results found for: "HMDIABEYEEXA"  Last diabetic Foot exam: No results found for: "HMDIABFOOTEX"   Lab Results  Component Value Date   CHOL 285 (H) 06/05/2021   HDL 56.90 06/05/2021   LDLCALC 207 (H) 06/05/2021   LDLDIRECT 139.5 09/01/2013   TRIG 108.0 06/05/2021  CHOLHDL 5 06/05/2021       Latest Ref Rng & Units 11/28/2021    3:49 PM 04/02/2021   12:40 AM 09/26/2020   10:47 AM  Hepatic Function  Total Protein 6.1 - 8.1 g/dL 6.6  6.1  6.6   Albumin 3.5 - 5.0 g/dL  3.3  4.1   AST 10 - 35 U/L 21  19  21    ALT 9 - 46 U/L 14   14  18    Alk Phosphatase 38 - 126 U/L  45  59   Total Bilirubin 0.2 - 1.2 mg/dL 0.4  0.7  0.5     Lab Results  Component Value Date/Time   TSH 0.84 11/28/2021 03:49 PM   TSH 1.40 09/26/2020 10:47 AM       Latest Ref Rng & Units 03/20/2022    3:11 PM 11/28/2021    3:49 PM 04/02/2021   12:40 AM  CBC  WBC 4.0 - 10.5 K/uL 9.2  9.8  8.3   Hemoglobin 13.0 - 17.0 g/dL 13.9  14.3  13.7   Hematocrit 39.0 - 52.0 % 41.9  42.9  41.1   Platelets 150.0 - 400.0 K/uL 291.0  284  229     Lab Results  Component Value Date/Time   VD25OH 69.98 12/19/2021 07:57 AM   VD25OH 27.55 (L) 09/26/2020 10:47 AM    Clinical ASCVD: No  The ASCVD Risk score (Arnett DK, et al., 2019) failed to calculate for the following reasons:   The 2019 ASCVD risk score is only valid for ages 76 to 52       03/20/2022    3:16 PM 03/20/2022    2:17 PM 12/31/2021    2:23 PM  Depression screen PHQ 2/9  Decreased Interest 2 2 0  Down, Depressed, Hopeless 1 1 1   PHQ - 2 Score 3 3 1   Altered sleeping 1 1   Tired, decreased energy 3 3   Change in appetite 2 2   Feeling bad or failure about yourself  1 1   Trouble concentrating 3 3   Moving slowly or fidgety/restless 1 1   Suicidal thoughts 0 0   PHQ-9 Score 14 14   Difficult doing work/chores Very difficult Very difficult       Social History   Tobacco Use  Smoking Status Former   Packs/day: 2.00   Years: 30.00   Total pack years: 60.00   Types: Cigarettes   Quit date: 06/03/1984   Years since quitting: 38.0  Smokeless Tobacco Never  Tobacco Comments   had AAA at the [place]; also noted 2018 on Dr. Kyla Balzarine note    BP Readings from Last 3 Encounters:  03/20/22 130/80  12/31/21 136/78  12/19/21 122/82   Pulse Readings from Last 3 Encounters:  03/20/22 72  12/31/21 100  12/19/21 86   Wt Readings from Last 3 Encounters:  03/20/22 129 lb (58.5 kg)  12/31/21 131 lb 11.2 oz (59.7 kg)  12/19/21 132 lb (59.9 kg)    Assessment/Interventions: Review of patient  past medical history, allergies, medications, health status, including review of consultants reports, laboratory and other test data, was performed as part of comprehensive evaluation and provision of chronic care management services.   SDOH:  (Social Determinants of Health) assessments and interventions performed: Yes  SDOH Interventions    Flowsheet Row Chronic Care Management from 06/09/2022 in [place] [facility] [place] from 03/20/2022 in [place] [facility] [place] Management from 07/13/2020 in [place]  [facility] from 06/19/2020 in [place] at [place]  SDOH Interventions      Transportation Interventions Intervention Not Indicated -- Intervention Not Indicated --  Depression Interventions/Treatment  -- Medication, Currently on Treatment -- Patient refuses Treatment  Financial Strain Interventions -- -- Intervention Not Indicated --       CCM Care Plan  Allergies  Allergen Reactions   Statins Other (See Comments)    Muscle cramps    Aspirin Other (See Comments)    bleeding    Medications Reviewed Today     Reviewed by Franco Collet, CMA (Certified Medical Assistant) on 03/20/22 at 1417  Med List Status: <None>   Medication Order Taking? Sig Documenting Provider Last Dose Status Informant  acetaminophen (TYLENOL) 325 MG tablet [id number] Yes Take 325 mg by mouth every 6 (six) hours as needed for moderate pain or headache. [provider] Taking Active Multiple Informants  atenolol (TENORMIN) 25 MG tablet [id number] Yes Take 0.5 tablets (12.5 mg total) by mouth daily. Nafziger, Tommi Rumps, NP Taking Active   azelastine (ASTELIN) 0.1 % nasal spray [id number] Yes Place 2 sprays into both nostrils at bedtime. Use in each nostril as directed [provider] Taking Active Multiple Informants  Cholecalciferol (VITAMIN D3) 125 MCG (5000 UT) CAPS [id number] Yes Take 5,000 Units by mouth daily. [provider] Taking Active Multiple Informants  escitalopram (LEXAPRO) 10 MG tablet [id number] Yes TAKE ONE TABLET BY MOUTH ONCE DAILY Nafziger, Tommi Rumps, NP Taking Active   ezetimibe (ZETIA) 10 MG tablet [id number] Yes TAKE 1 TABLET EACH DAY.  Patient taking differently: Take 10 mg by mouth daily.   Nafziger, Tommi Rumps, NP Taking Active   latanoprost (XALATAN) 0.005 % ophthalmic solution [id number] Yes Place 1 drop into both eyes at bedtime.  [provider] Taking Active Multiple Informants  mirtazapine (REMERON) 15 MG tablet [id number] Yes TAKE ONE TABLET BY MOUTH AT BEDTIME Nafziger, Tommi Rumps, NP Taking Active   omeprazole (PRILOSEC) 20 MG capsule [id number] Yes TAKE ONE CAPSULE BY MOUTH ONCE DAILY Nafziger, Tommi Rumps, NP Taking Active   simvastatin (ZOCOR) 10 MG tablet [id number] Yes Take 1 tablet (10 mg total) by mouth every other day. 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    Immunization History  Administered Date(s) Administered   Influenza Split 09/03/2011   Influenza Whole 08/01/2010   Influenza, High Dose Seasonal PF 06/14/2013, 06/13/2015, 05/08/2016, 05/20/2017, 06/01/2017, 06/07/2018, 06/25/2018, 05/25/2020   Influenza, Seasonal, Injecte, Preservative Fre 09/06/2012   Influenza,inj,Quad PF,6+ Mos 05/29/2014   Influenza-Unspecified 05/25/2005, 09/09/2006, 06/02/2007, 05/31/2008, 08/29/2008, 09/08/2009, 07/25/2010, 09/03/2011, 08/25/2012, 06/25/2013, 06/25/2014, 06/13/2015, 04/25/2016    Moderna SARS-COV2 Booster Vaccination 04/14/2020   PFIZER(Purple Top)SARS-COV-2 Vaccination 09/14/2019, 10/05/2019   Pfizer Covid-19 Vaccine Bivalent Booster 83yr & up 06/20/2021   Pneumococcal Conjugate-13 08/25/2013, 09/08/2013, 05/25/2017   Pneumococcal Polysaccharide-23 08/25/2005, 07/25/2009, 06/25/2018   Tdap 10/01/2011, 08/11/2018   Zoster, Live 09/06/2008   Patient reports he is starting to get a bit  of dementia. He is forgetting little things and he can still do everything he needs to but it takes him a lot longer to do those things.   Patient has not filled simvastatin since October 2022. He saw it on his medication list but did not find it in his pillbox. He reported he cannot remember when or why he stopped this. Called the pharmacy to have it refilled.  Conditions to be addressed/monitored:  Hypertension, Hyperlipidemia, GERD, Depression, Allergic Rhinitis and ocular migraine, glaucoma, and vitamin D deficiency  Conditions addressed this visit: Hyperlipidemia, hypertension  Care Plan : CCM Pharmacy Care Plan  Updates made by Viona Gilmore, [place] since 06/09/2022 12:00 AM     Problem: Problem: Hypertension, Hyperlipidemia, GERD, Depression, Allergic Rhinitis and ocular migraine, glaucoma, and vitamin D deficiency      Long-Range Goal: Patient-Specific Goal   Start Date: 10/15/2020  Expected End Date: 10/15/2021  Recent Progress: On track  Priority: High  Note:   Current Barriers:  Unable to achieve control of cholesterol  Unable to self administer medications as prescribed  Pharmacist Clinical Goal(s):  Patient will achieve adherence to monitoring guidelines and medication adherence to achieve therapeutic efficacy achieve control of cholesterol as evidenced by next lipid panel  through collaboration with PharmD and provider.   Interventions: 1:1 collaboration with Dorothyann Peng, NP regarding development and update of comprehensive plan of care as evidenced by  provider attestation and co-signature Inter-disciplinary care team collaboration (see longitudinal plan of care) Comprehensive medication review performed; medication list updated in electronic medical record  Hypertension (BP goal <140/90) -Controlled -Current treatment: Atenolol 25 mg 1/2 tablet daily - Appropriate, Query effective, Safe, Accessible -Medications previously tried: none -Current home readings: could not provide -Current dietary habits: has cut back on salt a lot and does not eat canned foods; he does admit to looking at package labels for sodium content -Current exercise habits: walks 3-4 miles a day at two different times of the day -Denies hypotensive/hypertensive symptoms -Educated on Importance of home blood pressure monitoring; -Counseled to monitor BP at home weekly, document, and provide log at future appointments -Recommended to continue current medication  Hyperlipidemia: (LDL goal < 70) -Uncontrolled -Current treatment: Ezetimibe 10 mg 1 tablet daily - Appropriate, Query effective, Safe, Accessible Simvastatin 10 mg 1 tablet every other day - not taking -Medications previously tried:  statins (muscle cramps) -Current dietary patterns: patient eats more chicken than red meat and does not drink alcohol -Current exercise habits:  walks 3-4 miles a day at two different times of the day -Educated on Cholesterol goals;  Benefits of statin for ASCVD risk reduction; -Recommended to continue current medication  Depression/Insomnia (Goal: minimize symptoms of depression and improve quality and quantity of sleep) -Controlled -Current treatment: Mirtazapine 15 mg 1 tablet at bedtime - Appropriate, Effective, Safe, Accessible Escitalopram 10 mg 1 tablet daily - Appropriate, Effective, Safe, Accessible -Medications previously tried/failed: n/a -PHQ9: 10 -Educated on Benefits of medication for symptom control -Recommended to continue current medication  Ocular  migraine (Goal: minimize symptoms) -Controlled -Current treatment  None -Medications previously tried: amitriptyline (cognitive impairment) -Recommended to continue current medication  GERD/dysphagia (Goal: minimize symptoms of heartburn or acid reflux) -Controlled -Current treatment  Omeprazole 20 mg 1 capsule daily - Appropriate, Effective, Safe, Accessible -Medications previously tried: none  -Recommended to continue current medication Recommended ENT referral  Vitamin D deficiency (Goal: vitamin D 30-100) -Controlled -Current treatment  No medications -Medications previously tried: vitamin D -Recommended to continue current medication  Counseled on drinking plenty of water  Allergic rhinitis (Goal: minimize symptoms of allergies) -Controlled -Current treatment  Atrovent nasal spray PRN - Appropriate, Effective, Safe, Accessible Claritin 10 mg 1 tablet daily    - Appropriate, Effective, Safe, Accessible -Medications previously tried: montelukast (cognitive impairment) -Recommended to continue current medication  Glaucoma (Goal: lower intraocular pressure) -Controlled -Current treatment  Latanoprost 0.005% 1 drop in both eyes at bedtime - Appropriate, Effective, Safe, Accessible -Medications previously tried: none  -Recommended to continue current medication  Health Maintenance -Vaccine gaps: Shingrix, COVID booster, influenza -Current therapy:  Benefiber daily -Educated on Cost vs benefit of each product must be carefully weighed by individual consumer -Patient is satisfied with current therapy and denies issues -Recommended to continue current medication  Patient Goals/Self-Care Activities Patient will:  - take medications as prescribed check blood pressure weekly, document, and provide at future appointments  Follow Up Plan: The care management team will reach out to the patient again over the next 14 days.         Medication Assistance: None required.   Patient affirms current coverage meets needs.  Compliance/Adherence/Medication fill history: Care Gaps: Shingrix, COVID booster, influenza Last BP - 130/80 on 03/20/2022  Star-Rating Drugs: Simvastatin 10 mg - last filled 06/11/2021 180 DS at [facility]. verified with pharmacist (patient is taking this medication every other day)  Patient's preferred pharmacy is:  [place], [place] [address] Phone: [phone] Fax: [phone]   Uses pill box? Yes - daughter manages Adherence is unclear  We discussed: Benefits of medication synchronization, packaging and delivery as well as enhanced pharmacist oversight with [facility]. Patient decided to: Continue current medication management strategy  Care Plan and Follow Up Patient Decision:  Patient agrees to Care Plan and Follow-up.  Plan: The care management team will reach out to the patient again over the next 30 days.  [name], PharmD [facility] Clinical Pharmacist [place] at [place]

## 2022-06-11 ENCOUNTER — Other Ambulatory Visit: Payer: Self-pay | Admitting: Adult Health

## 2022-06-11 DIAGNOSIS — K219 Gastro-esophageal reflux disease without esophagitis: Secondary | ICD-10-CM

## 2022-06-12 ENCOUNTER — Ambulatory Visit (INDEPENDENT_AMBULATORY_CARE_PROVIDER_SITE_OTHER): Payer: Medicare Other

## 2022-06-12 DIAGNOSIS — Z23 Encounter for immunization: Secondary | ICD-10-CM

## 2022-06-13 ENCOUNTER — Other Ambulatory Visit: Payer: Self-pay | Admitting: Adult Health

## 2022-06-13 DIAGNOSIS — I10 Essential (primary) hypertension: Secondary | ICD-10-CM

## 2022-06-13 DIAGNOSIS — F419 Anxiety disorder, unspecified: Secondary | ICD-10-CM

## 2022-06-13 DIAGNOSIS — R634 Abnormal weight loss: Secondary | ICD-10-CM

## 2022-06-13 DIAGNOSIS — K219 Gastro-esophageal reflux disease without esophagitis: Secondary | ICD-10-CM

## 2022-06-13 MED ORDER — SIMVASTATIN 10 MG PO TABS: 10.0000 mg | ORAL_TABLET | ORAL | 1 refills | Status: DC

## 2022-06-13 MED ORDER — EZETIMIBE 10 MG PO TABS: 10.0000 mg | ORAL_TABLET | Freq: Every day | ORAL | 1 refills | Status: DC

## 2022-06-13 MED ORDER — OMEPRAZOLE 20 MG PO CPDR: 20.0000 mg | DELAYED_RELEASE_CAPSULE | Freq: Every day | ORAL | 1 refills | Status: DC

## 2022-06-13 MED ORDER — MIRTAZAPINE 15 MG PO TABS: 15.0000 mg | ORAL_TABLET | Freq: Every day | ORAL | 1 refills | Status: DC

## 2022-06-13 MED ORDER — ESCITALOPRAM OXALATE 10 MG PO TABS: 10.0000 mg | ORAL_TABLET | Freq: Every day | ORAL | 1 refills | Status: DC

## 2022-06-13 MED ORDER — ATENOLOL 25 MG PO TABS: 12.5000 mg | ORAL_TABLET | Freq: Every day | ORAL | 1 refills | Status: DC

## 2022-06-24 DIAGNOSIS — E785 Hyperlipidemia, unspecified: Secondary | ICD-10-CM | POA: Diagnosis not present

## 2022-06-24 DIAGNOSIS — M25812 Other specified joint disorders, left shoulder: Secondary | ICD-10-CM | POA: Diagnosis not present

## 2022-06-24 DIAGNOSIS — I10 Essential (primary) hypertension: Secondary | ICD-10-CM

## 2022-06-24 DIAGNOSIS — M25811 Other specified joint disorders, right shoulder: Secondary | ICD-10-CM | POA: Diagnosis not present

## 2022-07-01 DIAGNOSIS — H43813 Vitreous degeneration, bilateral: Secondary | ICD-10-CM | POA: Diagnosis not present

## 2022-07-01 DIAGNOSIS — H401131 Primary open-angle glaucoma, bilateral, mild stage: Secondary | ICD-10-CM | POA: Diagnosis not present

## 2022-07-14 DIAGNOSIS — M1811 Unilateral primary osteoarthritis of first carpometacarpal joint, right hand: Secondary | ICD-10-CM | POA: Diagnosis not present

## 2022-08-01 ENCOUNTER — Emergency Department (HOSPITAL_COMMUNITY)
Admission: EM | Admit: 2022-08-01 | Discharge: 2022-08-01 | Disposition: A | Discharge status: Home / Self Care | Payer: Medicare Other | Attending: Emergency Medicine | Admitting: Emergency Medicine

## 2022-08-01 ENCOUNTER — Emergency Department (HOSPITAL_COMMUNITY): Payer: Medicare Other

## 2022-08-01 ENCOUNTER — Other Ambulatory Visit (HOSPITAL_COMMUNITY): Payer: Medicare Other

## 2022-08-01 DIAGNOSIS — R079 Chest pain, unspecified: Secondary | ICD-10-CM | POA: Diagnosis not present

## 2022-08-01 DIAGNOSIS — I10 Essential (primary) hypertension: Secondary | ICD-10-CM | POA: Insufficient documentation

## 2022-08-01 DIAGNOSIS — R0789 Other chest pain: Secondary | ICD-10-CM | POA: Insufficient documentation

## 2022-08-01 DIAGNOSIS — R0602 Shortness of breath: Secondary | ICD-10-CM | POA: Insufficient documentation

## 2022-08-01 DIAGNOSIS — I2510 Atherosclerotic heart disease of native coronary artery without angina pectoris: Secondary | ICD-10-CM | POA: Diagnosis not present

## 2022-08-01 DIAGNOSIS — J439 Emphysema, unspecified: Secondary | ICD-10-CM | POA: Diagnosis not present

## 2022-08-01 DIAGNOSIS — Z87891 Personal history of nicotine dependence: Secondary | ICD-10-CM | POA: Insufficient documentation

## 2022-08-01 DIAGNOSIS — I700 Atherosclerosis of aorta: Secondary | ICD-10-CM | POA: Diagnosis not present

## 2022-08-01 DIAGNOSIS — Z79899 Other long term (current) drug therapy: Secondary | ICD-10-CM | POA: Diagnosis not present

## 2022-08-01 DIAGNOSIS — M47814 Spondylosis without myelopathy or radiculopathy, thoracic region: Secondary | ICD-10-CM | POA: Diagnosis not present

## 2022-08-01 LAB — COMPREHENSIVE METABOLIC PANEL
ALT: 24 U/L (ref 0–44)
AST: 26 U/L (ref 15–41)
Albumin: 3.5 g/dL (ref 3.5–5.0)
Alkaline Phosphatase: 44 U/L (ref 38–126)
Anion gap: 8 (ref 5–15)
BUN: 17 mg/dL (ref 8–23)
CO2: 25 mmol/L (ref 22–32)
Calcium: 9.3 mg/dL (ref 8.9–10.3)
Chloride: 104 mmol/L (ref 98–111)
Creatinine, Ser: 1.52 mg/dL — ABNORMAL HIGH (ref 0.61–1.24)
GFR, Estimated: 45 mL/min — ABNORMAL LOW (ref >60)
Glucose, Bld: 109 mg/dL — ABNORMAL HIGH (ref 70–99)
Potassium: 3.9 mmol/L (ref 3.5–5.1)
Sodium: 137 mmol/L (ref 135–145)
Total Bilirubin: 0.4 mg/dL (ref 0.3–1.2)
Total Protein: 6.4 g/dL — ABNORMAL LOW (ref 6.5–8.1)

## 2022-08-01 LAB — CBC
HCT: 43.9 % (ref 39.0–52.0)
Hemoglobin: 14.3 g/dL (ref 13.0–17.0)
MCH: 30.4 pg (ref 26.0–34.0)
MCHC: 32.6 g/dL (ref 30.0–36.0)
MCV: 93.2 fL (ref 80.0–100.0)
Platelets: 246 10*3/uL (ref 150–400)
RBC: 4.71 MIL/uL (ref 4.22–5.81)
RDW: 14 % (ref 11.5–15.5)
WBC: 5.5 10*3/uL (ref 4.0–10.5)
nRBC: 0 % (ref 0.0–0.2)

## 2022-08-01 LAB — TROPONIN I (HIGH SENSITIVITY)
Troponin I (High Sensitivity): 15 ng/L (ref <18)
Troponin I (High Sensitivity): 15 ng/L (ref <18)

## 2022-08-01 LAB — LIPASE, BLOOD: Lipase: 34 U/L (ref 11–51)

## 2022-08-01 LAB — BRAIN NATRIURETIC PEPTIDE: B Natriuretic Peptide: 56.8 pg/mL (ref 0.0–100.0)

## 2022-08-01 LAB — I-STAT CREATININE, ED: Creatinine, Ser: 1.6 mg/dL — ABNORMAL HIGH (ref 0.61–1.24)

## 2022-08-01 MED ORDER — ALUM & MAG HYDROXIDE-SIMETH 200-200-20 MG/5ML PO SUSP
30.0000 mL | Freq: Once | ORAL | Status: AC
  Administered 2022-08-01: 30 mL via ORAL
  Filled 2022-08-01: qty 30

## 2022-08-01 MED ORDER — MORPHINE SULFATE (PF) 2 MG/ML IV SOLN
2.0000 mg | Freq: Once | INTRAVENOUS | Status: AC
  Administered 2022-08-01: 2 mg via INTRAVENOUS
  Filled 2022-08-01: qty 1

## 2022-08-01 MED ORDER — ONDANSETRON HCL 4 MG/2ML IJ SOLN
4.0000 mg | Freq: Once | INTRAMUSCULAR | Status: AC
  Administered 2022-08-01: 4 mg via INTRAVENOUS
  Filled 2022-08-01: qty 2

## 2022-08-01 MED ORDER — IOHEXOL 350 MG/ML SOLN
75.0000 mL | Freq: Once | INTRAVENOUS | Status: AC | PRN
  Administered 2022-08-01: 75 mL via INTRAVENOUS

## 2022-08-01 NOTE — Discharge Instructions (Signed)
Try pepcid or tagamet up to twice a day.  Try to avoid things that may make this worse, most commonly these are spicy foods tomato based products fatty foods chocolate and peppermint.  Alcohol and tobacco can also make this worse.  Return to the emergency department for sudden worsening pain fever or inability to eat or drink.  Since this is really severe especially at night sometimes will have you try not to eat or drink anything including water up to 4 hours prior to going to bed.  Try magnesium oxide supplements for your leg cramping.

## 2022-08-01 NOTE — ED Provider Triage Note (Signed)
Emergency Medicine Provider Triage Evaluation Note  Jason Farmer , a 87 y.o. male  was evaluated in triage.  Pt complains of 2 episodes of chest pain starting yesterday morning. Episode began yesterday AM, lasted for several hours, described as severe and "painful." No neuro symptoms, diaphoresis, N/V. Came back last night, brought him to his knees d/t severity of pain, caused him to urinate on himself. No current chest pain, but has back pain currently.  Review of Systems  Positive: Chest pain, back pain Negative: N/V/D  Physical Exam  BP 139/73 (BP Location: Right Arm)   Pulse 88   Temp 98.3 F (36.8 C)   Resp 16   SpO2 98%  Gen:   Awake, no distress   Resp:  Normal effort  MSK:   Moves extremities without difficulty   Medical Decision Making  Medically screening exam initiated at 11:24 AM.  Appropriate orders placed.  [name] was informed that the remainder of the evaluation will be completed by another provider, this initial triage assessment does not replace that evaluation, and the importance of remaining in the ED until their evaluation is complete.    Osvaldo Shipper, [place] 08/01/22 1126

## 2022-08-01 NOTE — ED Notes (Signed)
Patient transported to CT 

## 2022-08-01 NOTE — ED Notes (Signed)
Pt discharge instructions given to patient, was able to teach back. No further questions. Denies pain

## 2022-08-01 NOTE — ED Provider Notes (Addendum)
[facility] EMERGENCY DEPARTMENT Provider Note   CSN: [id number] Arrival date & time: 08/01/22  1108     History  Chief Complaint  Patient presents with   Chest Pain    TAVARES LEVINSON is a 87 y.o. male.  87 yo M with a cc of chest pain.  Occurring last couple of nights.  The patient said he would come in the middle of the night and feels severe chest discomfort.  This would tend to get better with sitting up and resting.  He is felt just very fatigued.  Has been coughing a little bit the past couple days.  His wife was just hospitalized and then has been in rehab and has been visiting her daily.  He denies fevers.  Feels worst in the middle the night when he wakes up with this.  Is like normal he goes to bed asymptomatic.  Is having some pain to the right upper back as well.  Patient denies history of MI, denies  hyperlipidemia diabetes or smoking.  His father had an MI is not sure what age.  He has a history of hypertension.  Remote smoking history. Patient denies history of PE or DVT denies hemoptysis denies unilateral lower extremity edema denies recent surgery immobilization hospitalization estrogen use or history of cancer.     Chest Pain      Home Medications Prior to Admission medications   Medication Sig Start Date End Date Taking? Authorizing Provider  acetaminophen (TYLENOL) 325 MG tablet Take 325 mg by mouth every 6 (six) hours as needed for moderate pain or headache.    [provider]  atenolol (TENORMIN) 25 MG tablet Take 0.5 tablets (12.5 mg total) by mouth daily. 06/13/22   Nafziger, Tommi Rumps, NP  azelastine (ASTELIN) 0.1 % nasal spray Place 2 sprays into both nostrils at bedtime. Use in each nostril as directed    [provider]  Cholecalciferol (VITAMIN D3) 125 MCG (5000 UT) CAPS Take 5,000 Units by mouth daily.    [provider]  escitalopram (LEXAPRO) 10 MG tablet Take 1 tablet (10 mg total) by mouth daily. 06/13/22    Nafziger, Tommi Rumps, NP  ezetimibe (ZETIA) 10 MG tablet Take 1 tablet (10 mg total) by mouth daily. 06/13/22   Nafziger, Tommi Rumps, NP  latanoprost (XALATAN) 0.005 % ophthalmic solution Place 1 drop into both eyes at bedtime.     [provider]  mirtazapine (REMERON) 15 MG tablet Take 1 tablet (15 mg total) by mouth at bedtime. 06/13/22   Nafziger, Tommi Rumps, NP  omeprazole (PRILOSEC) 20 MG capsule Take 1 capsule (20 mg total) by mouth daily. 06/13/22   Nafziger, Tommi Rumps, NP  simvastatin (ZOCOR) 10 MG tablet Take 1 tablet (10 mg total) by mouth every other day. 06/13/22   Nafziger, Tommi Rumps, NP      Allergies    Statins and Aspirin    Review of Systems   Review of Systems  Cardiovascular:  Positive for chest pain.    Physical Exam Updated Vital Signs BP 115/68 (BP Location: Right Arm)   Pulse 74   Temp 97.9 F (36.6 C) (Oral)   Resp (!) 23   SpO2 96%  Physical Exam Vitals and nursing note reviewed.  Constitutional:      Appearance: He is well-developed.  HENT:     Head: Normocephalic and atraumatic.  Eyes:     Pupils: Pupils are equal, round, and reactive to light.  Neck:     Vascular: No JVD.  Cardiovascular:     Rate and Rhythm: Normal rate and regular rhythm.     Heart sounds: No murmur heard.    No friction rub. No gallop.  Pulmonary:     Effort: No respiratory distress.     Breath sounds: No wheezing.  Abdominal:     General: There is no distension.     Tenderness: There is no abdominal tenderness. There is no guarding or rebound.     Comments: Some mild epigastric abdominal discomfort.  Musculoskeletal:        General: Normal range of motion.     Cervical back: Normal range of motion and neck supple.  Skin:    Coloration: Skin is not pale.     Findings: No rash.  Neurological:     Mental Status: He is alert and oriented to person, place, and time.  Psychiatric:        Behavior: Behavior normal.     ED Results / Procedures / Treatments   Labs (all labs ordered  are listed, but only abnormal results are displayed) Labs Reviewed  COMPREHENSIVE METABOLIC PANEL - Abnormal; Notable for the following components:      Result Value   Glucose, Bld 109 (*)    Creatinine, Ser 1.52 (*)    Total Protein 6.4 (*)    GFR, Estimated 45 (*)    All other components within normal limits  I-STAT CREATININE, ED - Abnormal; Notable for the following components:   Creatinine, Ser 1.60 (*)    All other components within normal limits  CBC  LIPASE, BLOOD  BRAIN NATRIURETIC PEPTIDE  CBG MONITORING, ED  TROPONIN I (HIGH SENSITIVITY)  TROPONIN I (HIGH SENSITIVITY)    EKG EKG Interpretation  Date/Time:  Friday August 01 2022 11:16:49 EST Ventricular Rate:  83 PR Interval:  156 QRS Duration: 128 QT Interval:  386 QTC Calculation: 453 R Axis:   1 Text Interpretation: Normal sinus rhythm Right bundle branch block Abnormal ECG No significant change since last tracing Confirmed by Deno Etienne [phone]) on 08/01/2022 11:39:13 AM  Radiology CT Angio Chest/Abd/Pel for Dissection W and/or Wo Contrast  Result Date: 08/01/2022 CLINICAL DATA:  Chest pain radiating to the back. EXAM: CT ANGIOGRAPHY CHEST, ABDOMEN AND PELVIS TECHNIQUE: Multidetector CT imaging through the chest, abdomen and pelvis was performed using the standard protocol during bolus administration of intravenous contrast. Multiplanar reconstructed images and MIPs were obtained and reviewed to evaluate the vascular anatomy. RADIATION DOSE REDUCTION: This exam was performed according to the departmental dose-optimization program which includes automated exposure control, adjustment of the mA and/or kV according to patient size and/or use of iterative reconstruction technique. CONTRAST:  67m OMNIPAQUE IOHEXOL 350 MG/ML SOLN COMPARISON:  Chest radiography same day. Previous chest CT 11/27/2016 FINDINGS: CTA CHEST FINDINGS Cardiovascular: Heart size is normal. No pericardial fluid. Coronary artery calcification is  present particularly in the left system. There is aortic atherosclerotic calcification but no aneurysm or dissection. There is not a pattern of extensive irregular soft plaque. Pulmonary arteries are well opacified and are normal. Mediastinum/Nodes: No mediastinal or hilar mass or lymphadenopathy. Lungs/Pleura: No pleural effusion. Background bullous emphysema which is progressive since 2018. No sign of acute infiltrate or collapse. No mass or nodule in need of additional follow-up. Musculoskeletal: Thoracic degenerative changes most pronounced at T9-10. Old appearing inferior endplate fracture at TJ81 Both of these findings are new since 2018. Review of the MIP images confirms the above findings. CTA ABDOMEN AND PELVIS FINDINGS VASCULAR Aorta: Aortic atherosclerosis.  No aneurysm. No dissection in this region. No stenosis. Celiac: Widely patent SMA: Widely patent Renals: Single renal artery on the right. 2 renal arteries on the left. No flow limiting stenosis. IMA: Patent. Inflow: Normal Veins: No vein abnormality seen. Review of the MIP images confirms the above findings. NON-VASCULAR Hepatobiliary: Normal Pancreas: Normal Spleen: Normal Adrenals/Urinary Tract: Adrenal glands are normal. Kidneys are normal. No cyst, mass, stone or hydronephrosis. Bladder is normal. Stomach/Bowel: Stomach and small intestine are normal. No abnormal colon finding. Lymphatic: No lymphadenopathy. Reproductive: Normal Other: No free fluid or air. Musculoskeletal: Ordinary lower lumbar degenerative changes. Review of the MIP images confirms the above findings. IMPRESSION: 1. Aortic atherosclerosis. No aneurysm or dissection. No pattern of extensive irregular soft plaque. 2. Coronary artery calcification particularly in the left system. 3. Background bullous emphysema which is progressive since 2018. No acute pulmonary finding. 4. No acute abdominal or pelvic finding. 5. Thoracic and lumbar degenerative changes. Old appearing inferior  endplate fracture at Z36. Both of these findings are new since 2018 but do not appear acute. Aortic Atherosclerosis (ICD10-I70.0) and Emphysema (ICD10-J43.9). Electronically Signed   By: Nelson Chimes M.D.   On: 08/01/2022 13:45   DG Chest 1 View  Result Date: 08/01/2022 CLINICAL DATA:  Intermittent chest pain since yesterday. EXAM: CHEST  1 VIEW COMPARISON:  Radiographs 04/02/2021 and 08/24/2018.  CT 11/27/2016. FINDINGS: 1132 hours. The heart size and mediastinal contours are stable. Chronic pulmonary fibrotic changes appear mildly progressive. No superimposed airspace disease, pleural effusion or pneumothorax identified. No acute osseous findings are seen. Patient is status post lower cervical fusion. IMPRESSION: Mildly progressive chronic pulmonary fibrotic changes. No acute cardiopulmonary process identified. Electronically Signed   By: Richardean Sale M.D.   On: 08/01/2022 11:43    Procedures Procedures    Medications Ordered in ED Medications  morphine (PF) 2 MG/ML injection 2 mg (2 mg Intravenous Given 08/01/22 1208)  ondansetron (ZOFRAN) injection 4 mg (4 mg Intravenous Given 08/01/22 1206)  iohexol (OMNIPAQUE) 350 MG/ML injection 75 mL (75 mLs Intravenous Contrast Given 08/01/22 1334)  alum & mag hydroxide-simeth (MAALOX/MYLANTA) 200-200-20 MG/5ML suspension 30 mL (30 mLs Oral Given 08/01/22 1418)    ED Course/ Medical Decision Making/ A&P                           Medical Decision Making Amount and/or Complexity of Data Reviewed Labs: ordered.  Risk OTC drugs. Prescription drug management.   87 yo M with a chief complaints of chest pain.  This been going on worse the past couple days.  Symptoms are worse when he lays back flat.  Not necessarily exertional but he tells me has been very fatigued.  He is having some pain that radiates into the right scapula.  Does have some mild abdominal discomfort on exam.  Will obtain LFTs lipase troponin.  EKG with right axis deviation.  No  obvious other change.  Was seen in the quick look process and there is some concern for dissection.  He does have a history of hypertension and is chest pain that radiates to the back that he rates as quite severe.  CT scan negative for dissection.  No obvious pneumonia or pulmonary edema.  No obvious intra-abdominal pathology.  Troponin is negative.  No anemia.  No significant electrolyte abnormality.  LFTs and lipase are unremarkable.  BNP negative.  I discussed results with the patient who was feeling well.  Will do a trial  of H2 blockers as symptoms are worse when he lays back flat.  Patient symptoms been going on greater than 6 hours and actually have resolved prior to that.  Do not feel delta is warranted.  Patient would like to go home at this time as well.  2:24 PM:  I have discussed the diagnosis/risks/treatment options with the patient and family.  Evaluation and diagnostic testing in the emergency department does not suggest an emergent condition requiring admission or immediate intervention beyond what has been performed at this time.  They will follow up with PCP. We also discussed returning to the ED immediately if new or worsening sx occur. We discussed the sx which are most concerning (e.g., sudden worsening pain, fever, inability to tolerate by mouth) that necessitate immediate return. Medications administered to the patient during their visit and any new prescriptions provided to the patient are listed below.  Medications given during this visit Medications  morphine (PF) 2 MG/ML injection 2 mg (2 mg Intravenous Given 08/01/22 1208)  ondansetron (ZOFRAN) injection 4 mg (4 mg Intravenous Given 08/01/22 1206)  iohexol (OMNIPAQUE) 350 MG/ML injection 75 mL (75 mLs Intravenous Contrast Given 08/01/22 1334)  alum & mag hydroxide-simeth (MAALOX/MYLANTA) 200-200-20 MG/5ML suspension 30 mL (30 mLs Oral Given 08/01/22 1418)     The patient appears reasonably screen and/or stabilized for  discharge and I doubt any other medical condition or other [facility] requiring further screening, evaluation, or treatment in the ED at this time prior to discharge.          Final Clinical Impression(s) / ED Diagnoses Final diagnoses:  Nonspecific chest pain    Rx / DC Orders ED Discharge Orders     None         Deno Etienne, DO 08/01/22 [place], [place], DO 08/16/22 1304

## 2022-08-01 NOTE — ED Triage Notes (Addendum)
Patient here for evaluation of chest pain that started yesterday and lasted for a few hours, resolved spontaneously, but then returned today more intensely and radiated into his abdomen. Patient states second episode of pain was so severe it caused him to fall to his needs and he had an episode of urinary incontinence.

## 2022-08-08 ENCOUNTER — Encounter: Payer: Self-pay | Admitting: Physician Assistant

## 2022-08-08 ENCOUNTER — Ambulatory Visit: Payer: Medicare Other | Admitting: Physician Assistant

## 2022-08-08 DIAGNOSIS — Z029 Encounter for administrative examinations, unspecified: Secondary | ICD-10-CM
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
## 2022-08-19 ENCOUNTER — Other Ambulatory Visit: Payer: Self-pay | Admitting: Adult Health

## 2022-08-19 DIAGNOSIS — F32A Depression, unspecified: Secondary | ICD-10-CM

## 2022-08-19 DIAGNOSIS — I10 Essential (primary) hypertension: Secondary | ICD-10-CM

## 2022-08-19 DIAGNOSIS — R634 Abnormal weight loss: Secondary | ICD-10-CM

## 2022-08-19 DIAGNOSIS — K219 Gastro-esophageal reflux disease without esophagitis: Secondary | ICD-10-CM

## 2022-08-27 ENCOUNTER — Telehealth: Payer: Self-pay | Admitting: Pharmacist

## 2022-08-27 NOTE — Progress Notes (Signed)
Care Coordination Pharmacy Assistant   Name: Jason Farmer  MRN: [id number] DOB: [birth year]  Reason for Encounter: ER follow up   Hospital visits:  Patient was seen at [facility] ED on 08/01/2022 (3 hours) due to nonsprecific chest pain.  New?Medications Started at [facility] Discharge:?? No medications started  Medication Changes at Hospital Discharge: No medication changes Medications Discontinued at Hospital Discharge: No medications discontinued Medications that remain the same after Hospital Discharge:??  -All other medications will remain the same.    Medications: Outpatient Encounter Medications as of 08/27/2022  Medication Sig   acetaminophen (TYLENOL) 325 MG tablet Take 325 mg by mouth every 6 (six) hours as needed for moderate pain or headache.   atenolol (TENORMIN) 25 MG tablet Take 1/2 tablet (12.5 mg total) by mouth daily.   azelastine (ASTELIN) 0.1 % nasal spray Place 2 sprays into both nostrils at bedtime. Use in each nostril as directed   Cholecalciferol (VITAMIN D3) 125 MCG (5000 UT) CAPS Take 5,000 Units by mouth daily.   escitalopram (LEXAPRO) 10 MG tablet TAKE ONE TABLET BY MOUTH ONCE DAILY   ezetimibe (ZETIA) 10 MG tablet Take 1 tablet (10 mg total) by mouth daily.   latanoprost (XALATAN) 0.005 % ophthalmic solution Place 1 drop into both eyes at bedtime.    mirtazapine (REMERON) 15 MG tablet TAKE ONE TABLET BY MOUTH AT BEDTIME   omeprazole (PRILOSEC) 20 MG capsule TAKE ONE CAPSULE BY MOUTH ONCE DAILY   simvastatin (ZOCOR) 10 MG tablet Take 1 tablet (10 mg total) by mouth every other day.   No facility-administered encounter medications on file as of 08/27/2022.   Notes:  Spoke with patient, he states he is doing fine and has not had any symptoms or concerns since his ED visit.   Care Gaps: AWV - completed 12/31/2021 Last BP - 130/80 on 03/20/2022 Shingrix - never done Covid - overdue  Star Rating Drugs: Simvastatin 10 mg - last filled  06/09/2022 [facility] at [facility] Pharmacist Assistant [phone]

## 2022-09-05 ENCOUNTER — Telehealth: Payer: Self-pay | Admitting: Adult Health

## 2022-09-05 NOTE — Telephone Encounter (Signed)
Patient's daughter stopped by to drop off form needing to be filled out for patient to get into assisted living. Form needs to be faxed back. Charge form was completed and attached. Paperwork placed in folder for completion.       Please advise

## 2022-09-05 NOTE — Telephone Encounter (Signed)
PPW received. Placed on providers desk.

## 2022-09-10 ENCOUNTER — Telehealth: Payer: Self-pay | Admitting: Adult Health

## 2022-09-10 NOTE — Telephone Encounter (Signed)
Daughter calling again.

## 2022-09-10 NOTE — Telephone Encounter (Signed)
Lm for pt daughter Verdis Frederickson to call back. If Verdis Frederickson does call back please advise that PPW was faxed.

## 2022-09-10 NOTE — Telephone Encounter (Signed)
Jason Farmer daughter is returning your call and want you to call her back.Marland Kitchen

## 2022-10-16 ENCOUNTER — Telehealth: Payer: Self-pay

## 2022-10-16 NOTE — Progress Notes (Signed)
Care Management & Coordination Services Pharmacy Team  Reason for Encounter: Hypertension  Contacted patient to discuss hypertension disease state. Spoke with patient on 10/17/2022   Current antihypertensive regimen:  Atenolol 25 mg 1/2 daily Patient verbally confirms he is taking the above medications as directed. Yes  How often are you checking your Blood Pressure? Patient is not checking blood pressures at home.  What recent interventions/DTPs have been made by any provider to improve Blood Pressure control since last CPP Visit: No recent interventions  Any recent hospitalizations or ED visits since last visit with CPP? No recent hospital visits.   What diet changes have been made to improve Blood Pressure Control?  Patient follows no specific diet Breakfast - patient will have eggs with sausage, cerea, creamed beef over toast.  Lunch - patient will have a salad most days Dinner - patient will have a variety of dinner meals, he cooks and eats what his wife wants for dinner. Caffeine intake: patient has 1 cup of coffee daily Salt intake:  What exercise is being done to improve your Blood Pressure Control?  Patient is walking twice daily    Adherence Review: Is the patient currently on ACE/ARB medication? No Does the patient have >5 day gap between last estimated fill dates? No  Note:  Patient states he has recently moved to [facility], he will be changing his PCP to the primary care physician at the facility.    Care Gaps: AWV - completed 12/31/2021 Next appointment - 01/17/2023 Shingrix - never done Covid - overdue   Star Rating Drugs: Simvastatin 10 mg - last filled 06/09/2022 180 DS at [facility]    Chart Updates: Recent office visits:  None  Recent consult visits:  None  Hospital visits:  None  Medications: Outpatient Encounter Medications as of 10/16/2022  Medication Sig   acetaminophen (TYLENOL) 325 MG tablet Take  325 mg by mouth every 6 (six) hours as needed for moderate pain or headache.   atenolol (TENORMIN) 25 MG tablet Take 1/2 tablet (12.5 mg total) by mouth daily.   azelastine (ASTELIN) 0.1 % nasal spray Place 2 sprays into both nostrils at bedtime. Use in each nostril as directed   Cholecalciferol (VITAMIN D3) 125 MCG (5000 UT) CAPS Take 5,000 Units by mouth daily.   escitalopram (LEXAPRO) 10 MG tablet TAKE ONE TABLET BY MOUTH ONCE DAILY   ezetimibe (ZETIA) 10 MG tablet Take 1 tablet (10 mg total) by mouth daily.   latanoprost (XALATAN) 0.005 % ophthalmic solution Place 1 drop into both eyes at bedtime.    mirtazapine (REMERON) 15 MG tablet TAKE ONE TABLET BY MOUTH AT BEDTIME   omeprazole (PRILOSEC) 20 MG capsule TAKE ONE CAPSULE BY MOUTH ONCE DAILY   simvastatin (ZOCOR) 10 MG tablet Take 1 tablet (10 mg total) by mouth every other day.   No facility-administered encounter medications on file as of 10/16/2022.  Fill History:   Dispensed Days Supply Quantity Provider Pharmacy  atenolol 25 mg tablet 08/19/2022 90 45 each      Dispensed Days Supply Quantity Provider Pharmacy  escitalopram 10 mg tablet 10/06/2022 90 90 each      Dispensed Days Supply Quantity Provider Pharmacy  ezetimibe 10 mg tablet 12/18/2020 90 90 each      Dispensed Days Supply Quantity Provider Pharmacy  latanoprost 0.005 % eye drops 08/21/2022 25 2.5 mL      Dispensed Days Supply Quantity Provider Pharmacy  mirtazapine 15 mg tablet 08/19/2022 90 90  each      Dispensed Days Supply Quantity Provider Pharmacy  omeprazole 20 mg capsule,delayed release 08/19/2022 90 90 each      Dispensed Days Supply Quantity Provider Pharmacy  simvastatin 10 mg tablet 06/09/2022 180 90 each     Recent Office Vitals: BP Readings from Last 3 Encounters:  08/01/22 115/68  03/20/22 130/80  12/31/21 136/78   Pulse Readings from Last 3 Encounters:  08/01/22 74  03/20/22 72  12/31/21 100    Wt Readings from Last 3 Encounters:   03/20/22 129 lb (58.5 kg)  12/31/21 131 lb 11.2 oz (59.7 kg)  12/19/21 132 lb (59.9 kg)     Kidney Function Lab Results  Component Value Date/Time   CREATININE 1.60 (H) 08/01/2022 11:46 AM   CREATININE 1.52 (H) 08/01/2022 11:30 AM   CREATININE 1.59 (H) 11/28/2021 03:49 PM   CREATININE 1.46 (H) 03/16/2020 03:06 PM   GFR 58.28 (L) 03/20/2022 03:11 PM   GFRNONAA 45 (L) 08/01/2022 11:30 AM   GFRNONAA 44 (L) 03/16/2020 03:06 PM   GFRAA 51 (L) 03/16/2020 03:06 PM       Latest Ref Rng & Units 08/01/2022   11:46 AM 08/01/2022   11:30 AM 03/20/2022    3:11 PM  BMP  Glucose 70 - 99 mg/dL  109  80   BUN 8 - 23 mg/dL  17  23   Creatinine 0.61 - 1.24 mg/dL 1.60  1.52  1.15   Sodium 135 - 145 mmol/L  137  136   Potassium 3.5 - 5.1 mmol/L  3.9  5.1   Chloride 98 - 111 mmol/L  104  102   CO2 22 - 32 mmol/L  25  28   Calcium 8.9 - 10.3 mg/dL  9.3  9.5    [place] Pharmacist Assistant [phone]

## 2022-11-06 ENCOUNTER — Encounter: Payer: Self-pay | Admitting: Gastroenterology

## 2022-11-06 ENCOUNTER — Ambulatory Visit (INDEPENDENT_AMBULATORY_CARE_PROVIDER_SITE_OTHER): Payer: Medicare Other | Admitting: Gastroenterology

## 2022-11-06 VITALS — BP 102/60 | HR 65 | Ht 68.0 in | Wt 139.0 lb

## 2022-11-06 DIAGNOSIS — R1319 Other dysphagia: Secondary | ICD-10-CM

## 2022-11-06 DIAGNOSIS — K219 Gastro-esophageal reflux disease without esophagitis: Secondary | ICD-10-CM

## 2022-11-06 NOTE — Patient Instructions (Addendum)
  You have been scheduled for an endoscopy. Please follow written instructions given to you at your visit today. If you use inhalers (even only as needed), please bring them with you on the day of your procedure.  ______________________________________________________  If your blood pressure at your visit was 140/90 or greater, please contact your primary care physician to follow up on this.  _______________________________________________________  If you are age 33 or older, your body mass index should be between 23-30. Your Body mass index is 21.13 kg/m. If this is out of the aforementioned range listed, please consider follow up with your Primary Care Provider.  If you are age 75 or younger, your body mass index should be between 19-25. Your Body mass index is 21.13 kg/m. If this is out of the aformentioned range listed, please consider follow up with your Primary Care Provider.   ________________________________________________________  The [facility] providers would like to encourage you to use [facility] to communicate with providers for non-urgent requests or questions.  Due to long hold times on the telephone, sending your provider a message by [facility] may be a faster and more efficient way to get a response.  Please allow 48 business hours for a response.  Please remember that this is for non-urgent requests.  _______________________________________________________  Thank you for choosing me and [facility].  Pricilla Riffle. Dagoberto Ligas., MD., Marval Regal

## 2022-11-06 NOTE — Progress Notes (Signed)
    Assessment     GERD, dysphagia, history of esophageal stricture - suspected recurrent stricture   Recommendations    Schedule EGD, dilation. The risks (including bleeding, perforation, infection, missed lesions, medication reactions and possible hospitalization or surgery if complications occur), benefits, and alternatives to endoscopy with possible biopsy and possible dilation were discussed with the patient and they consent to proceed.     HPI    This is an 88 year old male with recurrent solid food dysphagia.  He has a history of GERD with an esophageal stricture.  Last EGD with dilation below.  Over the past 2 months he has had frequent recurrent solid food dysphagia.  No active reflux symptoms on daily omeprazole.  He and his wife moved to an assisted living facility 2 months ago due to his wife's chronic health problems.  He states he has gained 18 pounds over the past 2 months with easier access to better food. Denies weight loss, abdominal pain, constipation, diarrhea, change in stool caliber, melena, hematochezia, nausea, vomiting, chest pain.   EGD Jan 2022    Labs / Imaging       Latest Ref Rng & Units 08/01/2022   11:30 AM 11/28/2021    3:49 PM 04/02/2021   12:40 AM  Hepatic Function  Total Protein 6.5 - 8.1 g/dL 6.4  6.6  6.1   Albumin 3.5 - 5.0 g/dL 3.5   3.3   AST 15 - 41 U/L 26  21  19    ALT 0 - 44 U/L 24  14  14    Alk Phosphatase 38 - 126 U/L 44   45   Total Bilirubin 0.3 - 1.2 mg/dL 0.4  0.4  0.7        Latest Ref Rng & Units 08/01/2022   11:30 AM 03/20/2022    3:11 PM 11/28/2021    3:49 PM  CBC  WBC 4.0 - 10.5 K/uL 5.5  9.2  9.8   Hemoglobin 13.0 - 17.0 g/dL 14.3  13.9  14.3   Hematocrit 39.0 - 52.0 % 43.9  41.9  42.9   Platelets 150 - 400 K/uL 246  291.0  284    Current Medications, Allergies, Past Medical History, Past Surgical History, Family History and Social History were reviewed in [employer] record.   Physical  Exam: General: Well developed, well nourished, elderly no acute distress Head: Normocephalic and atraumatic Eyes: Sclerae anicteric, EOMI Ears: Normal auditory acuity Mouth: No deformities or lesions noted Lungs: Clear throughout to auscultation Heart: Regular rate and rhythm; No murmurs, rubs or bruits Abdomen: Soft, non tender and non distended. No masses, hepatosplenomegaly or hernias noted. Normal Bowel sounds Rectal: not done Musculoskeletal: Symmetrical with no gross deformities  Pulses:  Normal pulses noted Extremities: No edema or deformities noted Neurological: Alert oriented x 4, grossly nonfocal Psychological:  Alert and cooperative. Normal mood and affect   Dellia Donnelly T. Fuller Plan, MD 11/06/2022, 10:14 AM

## 2022-11-12 ENCOUNTER — Observation Stay (HOSPITAL_COMMUNITY)
Admission: EM | Admit: 2022-11-12 | Discharge: 2022-11-13 | Disposition: A | Discharge status: Home / Self Care | Payer: Medicare Other | Attending: Internal Medicine | Admitting: Internal Medicine

## 2022-11-12 ENCOUNTER — Emergency Department (HOSPITAL_COMMUNITY): Payer: Medicare Other

## 2022-11-12 ENCOUNTER — Ambulatory Visit: Payer: Medicare Other | Admitting: Gastroenterology

## 2022-11-12 ENCOUNTER — Encounter (HOSPITAL_COMMUNITY): Payer: Self-pay

## 2022-11-12 ENCOUNTER — Encounter: Payer: Self-pay | Admitting: Gastroenterology

## 2022-11-12 ENCOUNTER — Other Ambulatory Visit: Payer: Self-pay

## 2022-11-12 VITALS — BP 173/94 | HR 79 | Temp 99.1°F | Resp 15 | Ht 68.0 in | Wt 139.0 lb

## 2022-11-12 DIAGNOSIS — R1319 Other dysphagia: Secondary | ICD-10-CM

## 2022-11-12 DIAGNOSIS — I480 Paroxysmal atrial fibrillation: Secondary | ICD-10-CM

## 2022-11-12 DIAGNOSIS — Z87891 Personal history of nicotine dependence: Secondary | ICD-10-CM | POA: Diagnosis not present

## 2022-11-12 DIAGNOSIS — Z8673 Personal history of transient ischemic attack (TIA), and cerebral infarction without residual deficits: Secondary | ICD-10-CM | POA: Insufficient documentation

## 2022-11-12 DIAGNOSIS — I10 Essential (primary) hypertension: Secondary | ICD-10-CM | POA: Diagnosis not present

## 2022-11-12 DIAGNOSIS — I4891 Unspecified atrial fibrillation: Principal | ICD-10-CM | POA: Diagnosis present

## 2022-11-12 DIAGNOSIS — R5383 Other fatigue: Secondary | ICD-10-CM | POA: Diagnosis not present

## 2022-11-12 DIAGNOSIS — Z79899 Other long term (current) drug therapy: Secondary | ICD-10-CM | POA: Insufficient documentation

## 2022-11-12 DIAGNOSIS — R000 Tachycardia, unspecified: Secondary | ICD-10-CM | POA: Diagnosis not present

## 2022-11-12 DIAGNOSIS — R42 Dizziness and giddiness: Secondary | ICD-10-CM

## 2022-11-12 LAB — CBC
HCT: 39.8 % (ref 39.0–52.0)
Hemoglobin: 12.5 g/dL — ABNORMAL LOW (ref 13.0–17.0)
MCH: 30.1 pg (ref 26.0–34.0)
MCHC: 31.4 g/dL (ref 30.0–36.0)
MCV: 95.9 fL (ref 80.0–100.0)
Platelets: 235 10*3/uL (ref 150–400)
RBC: 4.15 MIL/uL — ABNORMAL LOW (ref 4.22–5.81)
RDW: 12.8 % (ref 11.5–15.5)
WBC: 7.5 10*3/uL (ref 4.0–10.5)
nRBC: 0 % (ref 0.0–0.2)

## 2022-11-12 LAB — COMPREHENSIVE METABOLIC PANEL
ALT: 14 U/L (ref 0–44)
AST: 17 U/L (ref 15–41)
Albumin: 3.2 g/dL — ABNORMAL LOW (ref 3.5–5.0)
Alkaline Phosphatase: 59 U/L (ref 38–126)
Anion gap: 13 (ref 5–15)
BUN: 11 mg/dL (ref 8–23)
CO2: 19 mmol/L — ABNORMAL LOW (ref 22–32)
Calcium: 8.6 mg/dL — ABNORMAL LOW (ref 8.9–10.3)
Chloride: 106 mmol/L (ref 98–111)
Creatinine, Ser: 1.08 mg/dL (ref 0.61–1.24)
GFR, Estimated: >60 mL/min (ref >60)
Glucose, Bld: 79 mg/dL (ref 70–99)
Potassium: 3.8 mmol/L (ref 3.5–5.1)
Sodium: 138 mmol/L (ref 135–145)
Total Bilirubin: 0.8 mg/dL (ref 0.3–1.2)
Total Protein: 5.7 g/dL — ABNORMAL LOW (ref 6.5–8.1)

## 2022-11-12 LAB — TROPONIN I (HIGH SENSITIVITY)
Troponin I (High Sensitivity): 10 ng/L (ref <18)
Troponin I (High Sensitivity): 14 ng/L (ref <18)

## 2022-11-12 LAB — MAGNESIUM: Magnesium: 1.8 mg/dL (ref 1.7–2.4)

## 2022-11-12 LAB — BRAIN NATRIURETIC PEPTIDE: B Natriuretic Peptide: 81.7 pg/mL (ref 0.0–100.0)

## 2022-11-12 LAB — TSH: TSH: 0.846 u[IU]/mL (ref 0.350–4.500)

## 2022-11-12 MED ORDER — DILTIAZEM HCL-DEXTROSE 125-5 MG/125ML-% IV SOLN (PREMIX): 5.0000 mg/h | INTRAVENOUS | Status: DC

## 2022-11-12 MED ORDER — PANTOPRAZOLE SODIUM 40 MG PO TBEC
40.0000 mg | DELAYED_RELEASE_TABLET | Freq: Every day | ORAL | Status: DC
  Administered 2022-11-13: 40 mg via ORAL
  Filled 2022-11-12: qty 1

## 2022-11-12 MED ORDER — ACETAMINOPHEN 325 MG PO TABS: 650.0000 mg | ORAL_TABLET | Freq: Four times a day (QID) | ORAL | Status: DC | PRN

## 2022-11-12 MED ORDER — MELATONIN 5 MG PO TABS: 5.0000 mg | ORAL_TABLET | Freq: Every evening | ORAL | Status: DC | PRN

## 2022-11-12 MED ORDER — MIRTAZAPINE 15 MG PO TABS
15.0000 mg | ORAL_TABLET | Freq: Every day | ORAL | Status: DC
  Administered 2022-11-13: 15 mg via ORAL
  Filled 2022-11-12: qty 1

## 2022-11-12 MED ORDER — SODIUM CHLORIDE 0.9 % IV BOLUS
1000.0000 mL | Freq: Once | INTRAVENOUS | Status: AC
  Administered 2022-11-12: 1000 mL via INTRAVENOUS

## 2022-11-12 MED ORDER — POLYETHYLENE GLYCOL 3350 17 G PO PACK: 17.0000 g | PACK | Freq: Every day | ORAL | Status: DC | PRN

## 2022-11-12 MED ORDER — PROCHLORPERAZINE EDISYLATE 10 MG/2ML IJ SOLN: 5.0000 mg | Freq: Four times a day (QID) | INTRAMUSCULAR | Status: DC | PRN

## 2022-11-12 MED ORDER — LACTATED RINGERS IV SOLN: INTRAVENOUS | Status: DC

## 2022-11-12 MED ORDER — LATANOPROST 0.005 % OP SOLN
1.0000 [drp] | Freq: Every day | OPHTHALMIC | Status: DC
  Filled 2022-11-12: qty 2.5

## 2022-11-12 MED ORDER — VITAMIN D 25 MCG (1000 UNIT) PO TABS
5000.0000 [IU] | ORAL_TABLET | Freq: Every day | ORAL | Status: DC
  Administered 2022-11-13: 5000 [IU] via ORAL
  Filled 2022-11-12: qty 5

## 2022-11-12 MED ORDER — ESCITALOPRAM OXALATE 10 MG PO TABS
10.0000 mg | ORAL_TABLET | Freq: Every day | ORAL | Status: DC
  Administered 2022-11-13: 10 mg via ORAL
  Filled 2022-11-12: qty 1

## 2022-11-12 MED ORDER — METOPROLOL TARTRATE 50 MG PO TABS
50.0000 mg | ORAL_TABLET | Freq: Two times a day (BID) | ORAL | Status: DC
  Administered 2022-11-12 – 2022-11-13 (×2): 50 mg via ORAL
  Filled 2022-11-12: qty 1
  Filled 2022-11-12: qty 2

## 2022-11-12 MED ORDER — EZETIMIBE 10 MG PO TABS
10.0000 mg | ORAL_TABLET | Freq: Every day | ORAL | Status: DC
  Administered 2022-11-13: 10 mg via ORAL
  Filled 2022-11-12: qty 1

## 2022-11-12 MED ORDER — SIMVASTATIN 20 MG PO TABS
10.0000 mg | ORAL_TABLET | ORAL | Status: DC
  Administered 2022-11-13: 10 mg via ORAL
  Filled 2022-11-12: qty 1

## 2022-11-12 MED ORDER — AZELASTINE HCL 0.1 % NA SOLN
2.0000 | Freq: Every day | NASAL | Status: DC
  Filled 2022-11-12: qty 30

## 2022-11-12 MED ORDER — SODIUM CHLORIDE 0.9 % IV SOLN: 500.0000 mL | Freq: Once | INTRAVENOUS | Status: DC

## 2022-11-12 MED ORDER — APIXABAN 5 MG PO TABS
5.0000 mg | ORAL_TABLET | Freq: Two times a day (BID) | ORAL | Status: DC
  Administered 2022-11-12 – 2022-11-13 (×2): 5 mg via ORAL
  Filled 2022-11-12 (×2): qty 1

## 2022-11-12 NOTE — ED Notes (Signed)
Patient tachy at 140. EKG obtained. MD notified. Patient denies CP, SOB.

## 2022-11-12 NOTE — ED Provider Triage Note (Signed)
Emergency Medicine Provider Triage Evaluation Note  Jason Farmer , a 88 y.o. male  was evaluated in triage.  He went in for dilation via EGD of an esophageal stricture today and was found to be tachycardic prior to initiation of the procedure.  He was given labetalol and transferred to the emergency department for evaluation.  No history of arrhythmias.  Says that he has been feeling light headed and "woozy" for the past few days. NPO since midnight.  Did not have to perform bowel prep and has been eating well otherwise.  Review of Systems  Positive: Dizziness Negative: Chest pain, shortness of breath  Physical Exam  BP (!) 146/86   Pulse 71   Temp 98.3 F (36.8 C)   Resp 16   SpO2 99%  Gen:   Awake, no distress   Resp:  Normal effort CV:  Regular rate and rhythm no rubs or gallops MSK:   Moves extremities without difficulty, no lower extremity edema  Medical Decision Making  Medically screening exam initiated at 4:39 PM.  Appropriate orders placed.  Jason Farmer was informed that the remainder of the evaluation will be completed by another provider, this initial triage assessment does not replace that evaluation, and the importance of remaining in the ED until their evaluation is complete.  Will send electrolytes and basic labs with his symptoms.  Appears to be in normal sinus rhythm at this time so does not require any immediate medications.  With his dizziness will obtain troponin in case it is a anginal equivalent.   Fransico Meadow, MD 11/12/22 [phone]

## 2022-11-12 NOTE — Progress Notes (Signed)
See 11/06/2022 H&P, no changes

## 2022-11-12 NOTE — Progress Notes (Signed)
EKG performed in recovery, patient's HR was in the 150's and after EKG patient went by to sinus rhythm and stated the chest pressure had resolved.  Dr. Fuller Plan will be sending him EMS to [facility] hospital for further evaluation for increased episodes of heart rate and chest pressure.

## 2022-11-12 NOTE — Progress Notes (Signed)
Upon admission to procedure room for egd, patient's HR was in the mid 140s. BP stable with no chest pain. Watched patient for quite some time, with Dr. Fuller Plan at bedside and aware. Patient mentating appropriately. HR from 80 SR to 140s, with no intervention. Decision to give small bolus of Labetolol to determine if HR would stay in 70-80 range. HR did not stay in normal range, after Labetolol. To pacu for 12-lead EKG, for further evaluation. Procedure cancelled, patient made aware of why procedure was cancelled and understood.

## 2022-11-12 NOTE — ED Triage Notes (Addendum)
Pt bib ems from the endoscopy center; wheeled into procedure room, pt had runs of sinus tach 130s; denies chest pain, dizziness, sob; HR normal with ems, 70s-80s, regular;1 L fluid PTA; 5 mg labetalol at facility; 100% Ra, 141/80; pt endorses head fullness/"wooziness" x 4 days

## 2022-11-12 NOTE — ED Notes (Signed)
ED TO INPATIENT HANDOFF REPORT  ED Nurse Name and Phone #: Aly Seidenberg [phone]  S Name/Age/Gender [name] 88 y.o. male Room/Bed: 023C/023C  Code Status   Code Status: Full Code  Home/SNF/Other Home Patient oriented to: self Is this baseline? Yes   Triage Complete: Triage complete  Chief Complaint New onset atrial fibrillation [facility]) [I48.91]  Triage Note Pt bib ems from the endoscopy center; wheeled into procedure room, pt had runs of sinus tach 130s; denies chest pain, dizziness, sob; HR normal with ems, 70s-80s, regular;1 L fluid PTA; 5 mg labetalol at facility; 100% Ra, 141/80; pt endorses head fullness/"wooziness" x 4 days   Allergies Allergies  Allergen Reactions   Statins Other (See Comments)    Muscle cramps    Aspirin Other (See Comments)    bleeding    Level of Care/Admitting Diagnosis ED Disposition     ED Disposition  Admit   Condition  --   [place]: [place] [[id number]]  Level of Care: Telemetry Cardiac [103]  May place patient in observation at [facility] or [place] if equivalent level of care is available:: No  Covid Evaluation: Asymptomatic - no recent exposure (last 10 days) testing not required  Diagnosis: New onset atrial fibrillation [facility]) [id number]  Admitting Physician: Kayleen Memos [id number]  Attending Physician: Kayleen Memos [id number]          B Medical/Surgery History Past Medical History:  Diagnosis Date   Anxiety    "not a problem for a long time" (11/27/2016)   Arthritis    "wrists, knees, shoulders, back" (11/27/2016)   Chronic lower back pain    "5th disc is protruding" (11/27/2016)   Essential tremor    GERD (gastroesophageal reflux disease)    Glaucoma    Heart murmur    dx'd in [employer] in the 1960s; haven't been seen for it since "(11/27/2016)   Hepatitis ~ 1958   "jaundice kind"   HOH (hard of hearing)    Hyperlipemia    Migraine    "none since my neck OR" (11/27/2016)   Seasonal  allergies    TIA (transient ischemic attack) 04/2015   Wears glasses    Wears hearing aid    Past Surgical History:  Procedure Laterality Date   ANTERIOR CERVICAL DECOMP/DISCECTOMY FUSION  2007   APPENDECTOMY     BACK SURGERY     CARPAL TUNNEL RELEASE Right 10/12/2013   Procedure: RIGHT CARPAL TUNNEL RELEASE;  Surgeon: Wynonia Sours, MD;  Location: [place];  Service: Orthopedics;  Laterality: Right;   CATARACT EXTRACTION W/ INTRAOCULAR LENS  IMPLANT, BILATERAL Bilateral    COLONOSCOPY     HEMORRHOID SURGERY     KNEE ARTHROSCOPY Left 1970s   NASAL SEPTOPLASTY W/ TURBINOPLASTY Bilateral 02/15/2013   Procedure: NASAL SEPTOPLASTY WITH BILATER TURBINATE REDUCTION;  Surgeon: Ascencion Dike, MD;  Location: [place];  Service: ENT;  Laterality: Bilateral;   TONSILLECTOMY     TRIGGER FINGER RELEASE  06/08/2012   Procedure: RELEASE TRIGGER FINGER/A-1 PULLEY;  Surgeon: Wynonia Sours, MD;  Location: [place];  Service: Orthopedics;  Laterality: Left;  Release A-1 Pulley Left Long Finger   TRIGGER FINGER RELEASE  07/14/2012   Procedure: RELEASE TRIGGER FINGER/A-1 PULLEY;  Surgeon: Wynonia Sours, MD;  Location: [place];  Service: Orthopedics;  Laterality: Right;   TRIGGER FINGER RELEASE Right 10/12/2013   Procedure: RELEASE TRIGGER FINGER/A-1 PULLEY RIGHT MIDDLE  FINGER;  Surgeon: Wynonia Sours, MD;  Location: [place];  Service: Orthopedics;  Laterality: Right;   UPPER GASTROINTESTINAL ENDOSCOPY       A IV Location/Drains/Wounds Patient Lines/Drains/Airways Status     Active Line/Drains/Airways     Name Placement date Placement time Site Days   Peripheral IV 11/12/22 22 G 1" Anterior;Distal;Right;Upper Arm 11/12/22  1400  Arm  less than 1            Intake/Output Last 24 hours  Intake/Output Summary (Last 24 hours) at 11/12/2022 2204 Last data filed at 11/12/2022 2149 Gross per 24 hour  Intake 1000 ml  Output  --  Net 1000 ml    Labs/Imaging Results for orders placed or performed during the hospital encounter of 11/12/22 (from the past 48 hour(s))  CBC     Status: Abnormal   Collection Time: 11/12/22  5:03 PM  Result Value Ref Range   WBC 7.5 4.0 - 10.5 K/uL   RBC 4.15 (L) 4.22 - 5.81 MIL/uL   Hemoglobin 12.5 (L) 13.0 - 17.0 g/dL   HCT 39.8 39.0 - 52.0 %   MCV 95.9 80.0 - 100.0 fL   MCH 30.1 26.0 - 34.0 pg   MCHC 31.4 30.0 - 36.0 g/dL   RDW 12.8 11.5 - 15.5 %   Platelets 235 150 - 400 K/uL   nRBC 0.0 0.0 - 0.2 %    Comment: Performed at [facility], [place] [address], [place], [place] [address]  Comprehensive metabolic panel     Status: Abnormal   Collection Time: 11/12/22  5:03 PM  Result Value Ref Range   Sodium 138 135 - 145 mmol/L   Potassium 3.8 3.5 - 5.1 mmol/L   Chloride 106 98 - 111 mmol/L   CO2 19 (L) 22 - 32 mmol/L   Glucose, Bld 79 70 - 99 mg/dL    Comment: Glucose reference range applies only to samples taken after fasting for at least 8 hours.   BUN 11 8 - 23 mg/dL   Creatinine, Ser 1.08 0.61 - 1.24 mg/dL   Calcium 8.6 (L) 8.9 - 10.3 mg/dL   Total Protein 5.7 (L) 6.5 - 8.1 g/dL   Albumin 3.2 (L) 3.5 - 5.0 g/dL   AST 17 15 - 41 U/L   ALT 14 0 - 44 U/L   Alkaline Phosphatase 59 38 - 126 U/L   Total Bilirubin 0.8 0.3 - 1.2 mg/dL   GFR, Estimated >60 >60 mL/min    Comment: (NOTE) Calculated using the CKD-EPI Creatinine Equation (2021)    Anion gap 13 5 - 15    Comment: Performed at [facility], [place] [address] [place], [place] [address]  Magnesium     Status: None   Collection Time: 11/12/22  5:03 PM  Result Value Ref Range   Magnesium 1.8 1.7 - 2.4 mg/dL    Comment: Performed at [place] [address] [place], [place] [address]  TSH     Status: None   Collection Time: 11/12/22  5:03 PM  Result Value Ref Range   TSH 0.846 0.350 - 4.500 uIU/mL    Comment: Performed by a 3rd Generation assay with a functional sensitivity of <=0.01  uIU/mL. Performed at [facility], [place] [address], [place], [place] [address]   Troponin I (High Sensitivity)     Status: None   Collection Time: 11/12/22  5:03 PM  Result Value Ref Range  Troponin I (High Sensitivity) 10 <18 ng/L    Comment: (NOTE) Elevated high sensitivity troponin I (hsTnI) values and significant  changes across serial measurements may suggest ACS but many other  chronic and acute conditions are known to elevate hsTnI results.  Refer to the "Links" section for chest pain algorithms and additional  guidance. Performed at [facility], [place] [address], [place], [place] [address]   Brain natriuretic peptide     Status: None   Collection Time: 11/12/22  5:03 PM  Result Value Ref Range   B Natriuretic Peptide 81.7 0.0 - 100.0 pg/mL    Comment: Performed at [facility] [address], [place], [place] [address]  Troponin I (High Sensitivity)     Status: None   Collection Time: 11/12/22  6:41 PM  Result Value Ref Range   Troponin I (High Sensitivity) 14 <18 ng/L    Comment: (NOTE) Elevated high sensitivity troponin I (hsTnI) values and significant  changes across serial measurements may suggest ACS but many other  chronic and acute conditions are known to elevate hsTnI results.  Refer to the "Links" section for chest pain algorithms and additional  guidance. Performed at [facility], [place] [address], [place], [place] [address]    DG Chest 2 View  Result Date: 11/12/2022 CLINICAL DATA:  Tachycardia EXAM: CHEST - 2 VIEW COMPARISON:  Chest x-ray dated August 01, 2022 FINDINGS: Cardiac and mediastinal contours are within normal limits. Coarse bilateral reticular opacities, unchanged when compared with the prior exam. No consolidation. No evidence of pleural effusion or pneumothorax. 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 No consolidation.  Electronically Signed   By: Yetta Glassman M.D.   On: 11/12/2022 17:43    Pending Labs Unresulted Labs (From admission, onward)    None       Vitals/Pain Today's Vitals   11/12/22 1915 11/12/22 2024 11/12/22 2024 11/12/22 2130  BP: (!) 124/102  (!) 143/97 115/87  Pulse: 74  88 72  Resp: (!) 26  16 16   Temp:  99 F (37.2 C) 99 F (37.2 C)   TempSrc:  Oral Oral   SpO2: 98%  97% 96%  Weight:      Height:      PainSc:        Isolation Precautions No active isolations  Medications Medications  diltiazem (CARDIZEM) 125 mg in dextrose 5% 125 mL (1 mg/mL) infusion ( Intravenous Not Given 11/12/22 1925)  apixaban (ELIQUIS) tablet 5 mg (5 mg Oral Given 11/12/22 2147)  metoprolol tartrate (LOPRESSOR) tablet 50 mg (50 mg Oral Given 11/12/22 2148)  sodium chloride 0.9 % bolus 1,000 mL (0 mLs Intravenous Stopped 11/12/22 2149)    Mobility walks with person assist     Focused Assessments Cardiac Assessment Handoff:    Lab Results  Component Value Date   CKTOTAL 75 11/10/2016   TROPONINI 0.08 (HH) 11/28/2016   Lab Results  Component Value Date   DDIMER 3.29 (H) 11/27/2016   Does the Patient currently have chest pain? No    R Recommendations: See Admitting Provider Note  Report given to:   Additional Notes: pt having short runs of SVT. Stable BP.

## 2022-11-12 NOTE — Consult Note (Signed)
CARDIOLOGY CONSULT NOTE       Patient ID: Jason Farmer MRN: [id number] DOB/AGE: 09-01-1934 88 y.o.  Admit date: 11/12/2022 Referring Physician: Fuller Plan Primary Physician: Dorothyann Peng, NP Primary Cardiologist: New Reason for Consultation: Rapid Afib  Active Problems:   * No active hospital problems. *   HPI:  88 y.o. presents to ER with rapid afib/flutter.  Was to have EGD with dilatation for dysphagia and on presentation noted to have HR 140-150 Patient has had recurrent dysphagia and two previous balloon dilatations by Dr Fuller Plan. Has had difficulty swallowing solids/meat for 6-8 weeks. I saw patient in 2018 for coronary calcium seen on CT abdomen. Myovue was normal. He also had some carotid plaque on duplex with no stenosis On statin and zetia for HLD and atenolol for BP  Currently rate 140 Asymptomatic with BP [id number] systolic No chest pain, palpitations, syncope or dyspnea May have had some fatigue lately No bleeding issues or contraindications to DOAC.   TSH normal K 3.8 Cr 1.08 Hct 39.8 PLT 235   Discussed issues with rate control , anticoagulation for stroke prevention and possible future [facility] with patient and daughter  He is retired from heating / air. 4 kids 2 here one in [place] and one in [place] He and wife live at [place] and she has had seizure issues recently   ROS All other systems reviewed and negative except as noted above  Past Medical History:  Diagnosis Date   Anxiety    "not a problem for a long time" (11/27/2016)   Arthritis    "wrists, knees, shoulders, back" (11/27/2016)   Chronic lower back pain    "5th disc is protruding" (11/27/2016)   Essential tremor    GERD (gastroesophageal reflux disease)    Glaucoma    Heart murmur    dx'd in [employer] in the 1960s; haven't been seen for it since "(11/27/2016)   Hepatitis ~ 1958   "jaundice kind"   HOH (hard of hearing)    Hyperlipemia    Migraine    "none since my neck OR" (11/27/2016)   Seasonal allergies     TIA (transient ischemic attack) 04/2015   Wears glasses    Wears hearing aid     Family History  Problem Relation Age of Onset   Dementia Father    Heart failure Father    Glaucoma Mother    Colon cancer Neg Hx     Social History   Socioeconomic History   Marital status: Married    Spouse name: Not on file   Number of children: 4   Years of education: Not on file   Highest education level: Some college, no degree  Occupational History   Not on file  Tobacco Use   Smoking status: Former    Packs/day: 2.00    Years: 30.00    Additional pack years: 0.00    Total pack years: 60.00    Types: Cigarettes    Quit date: 06/03/1984    Years since quitting: 38.4   Smokeless tobacco: Never   Tobacco comments:    had AAA at the [place]; also noted 2018 on Dr. Kyla Balzarine note   Vaping Use   Vaping Use: Never used  Substance and Sexual Activity   Alcohol use: No    Comment: 11/27/2016 "quit in 1985"   Drug use: No   Sexual activity: Not Currently  Other Topics Concern   Not on file  Social History Narrative   Retired from [employer]  Married    4 children, one son lives locally   Social Determinants of Health   Financial Resource Strain: Low Risk  (12/31/2021)   Overall Financial Resource Strain (CARDIA)    Difficulty of Paying Living Expenses: Not hard at all  Food Insecurity: No Food Insecurity (12/31/2021)   Hunger Vital Sign    Worried About Running Out of Food in the Last Year: Never true    Ran Out of Food in the Last Year: Never true  Transportation Needs: No Transportation Needs (06/09/2022)   PRAPARE - Hydrologist (Medical): No    Lack of Transportation (Non-Medical): No  Physical Activity: Inactive (12/31/2021)   Exercise Vital Sign    Days of Exercise per Week: 0 days    Minutes of Exercise per Session: 0 min  Stress: No Stress Concern Present (12/31/2021)   [place]     Feeling of Stress : Not at all  Recent Concern: Stress - Stress Concern Present (11/28/2021)   [place]    Feeling of Stress : Rather much  Social Connections: Moderately Integrated (11/28/2021)   Social Connection and Isolation Panel [NHANES]    Frequency of Communication with Friends and Family: Three times a week    Frequency of Social Gatherings with Friends and Family: Once a week    Attends Religious Services: Never    Marine scientist or Organizations: Yes    Attends Music therapist: More than 4 times per year    Marital Status: Married  Human resources officer Violence: Not At Risk (12/25/2020)   Humiliation, Afraid, Rape, and Kick questionnaire    Fear of Current or Ex-Partner: No    Emotionally Abused: No    Physically Abused: No    Sexually Abused: No    Past Surgical History:  Procedure Laterality Date   ANTERIOR CERVICAL DECOMP/DISCECTOMY FUSION  2007   APPENDECTOMY     BACK SURGERY     CARPAL TUNNEL RELEASE Right 10/12/2013   Procedure: RIGHT CARPAL TUNNEL RELEASE;  Surgeon: Wynonia Sours, MD;  Location: [place];  Service: Orthopedics;  Laterality: Right;   CATARACT EXTRACTION W/ INTRAOCULAR LENS  IMPLANT, BILATERAL Bilateral    COLONOSCOPY     HEMORRHOID SURGERY     KNEE ARTHROSCOPY Left 1970s   NASAL SEPTOPLASTY W/ TURBINOPLASTY Bilateral 02/15/2013   Procedure: NASAL SEPTOPLASTY WITH BILATER TURBINATE REDUCTION;  Surgeon: Ascencion Dike, MD;  Location: [place];  Service: ENT;  Laterality: Bilateral;   TONSILLECTOMY     TRIGGER FINGER RELEASE  06/08/2012   Procedure: RELEASE TRIGGER FINGER/A-1 PULLEY;  Surgeon: Wynonia Sours, MD;  Location: [place];  Service: Orthopedics;  Laterality: Left;  Release A-1 Pulley Left Long Finger   TRIGGER FINGER RELEASE  07/14/2012   Procedure: RELEASE TRIGGER FINGER/A-1 PULLEY;  Surgeon: Wynonia Sours, MD;   Location: [place];  Service: Orthopedics;  Laterality: Right;   TRIGGER FINGER RELEASE Right 10/12/2013   Procedure: RELEASE TRIGGER FINGER/A-1 PULLEY RIGHT MIDDLE FINGER;  Surgeon: Wynonia Sours, MD;  Location: [place];  Service: Orthopedics;  Laterality: Right;   UPPER GASTROINTESTINAL ENDOSCOPY        Current Facility-Administered Medications:    0.9 %  sodium chloride infusion, 500 mL, Intravenous, Once, Ladene Artist, MD   diltiazem (CARDIZEM) 125 mg in dextrose 5%  125 mL (1 mg/mL) infusion, 5-15 mg/hr, Intravenous, Continuous, Tegeler, Gwenyth Allegra, MD   sodium chloride 0.9 % bolus 1,000 mL, 1,000 mL, Intravenous, Once, Tegeler, Gwenyth Allegra, MD  Current Outpatient Medications:    acetaminophen (TYLENOL) 325 MG tablet, Take 325 mg by mouth every 6 (six) hours as needed for moderate pain or headache., Disp: , Rfl:    atenolol (TENORMIN) 25 MG tablet, Take 1/2 tablet (12.5 mg total) by mouth daily., Disp: 90 tablet, Rfl: 0   azelastine (ASTELIN) 0.1 % nasal spray, Place 2 sprays into both nostrils at bedtime. Use in each nostril as directed, Disp: , Rfl:    Cholecalciferol (VITAMIN D3) 125 MCG (5000 UT) CAPS, Take 5,000 Units by mouth daily., Disp: , Rfl:    escitalopram (LEXAPRO) 10 MG tablet, TAKE ONE TABLET BY MOUTH ONCE DAILY, Disp: 90 tablet, Rfl: 0   ezetimibe (ZETIA) 10 MG tablet, Take 1 tablet (10 mg total) by mouth daily., Disp: 90 tablet, Rfl: 1   latanoprost (XALATAN) 0.005 % ophthalmic solution, Place 1 drop into both eyes at bedtime. , Disp: , Rfl:    mirtazapine (REMERON) 15 MG tablet, TAKE ONE TABLET BY MOUTH AT BEDTIME, Disp: 90 tablet, Rfl: 0   omeprazole (PRILOSEC) 20 MG capsule, TAKE ONE CAPSULE BY MOUTH ONCE DAILY, Disp: 90 capsule, Rfl: 0   simvastatin (ZOCOR) 10 MG tablet, Take 1 tablet (10 mg total) by mouth every other day., Disp: 90 tablet, Rfl: 1   sodium chloride     diltiazem (CARDIZEM) infusion     sodium chloride       Physical Exam: Blood pressure (!) 146/86, pulse 71, temperature 98.3 F (36.8 C), resp. rate 16, height 5\' 8"  (1.727 m), weight 63 kg, SpO2 99 %.    Affect appropriate Healthy:  appears stated age 78: normal Neck supple with no adenopathy JVP normal no bruits no thyromegaly Lungs clear with no wheezing and good diaphragmatic motion Heart:  S1/S2 no murmur, no rub, gallop or click PMI normal Abdomen: benighn, BS positve, no tenderness, no AAA no bruit.  No HSM or HJR Distal pulses intact with no bruits No edema Neuro non-focal Skin warm and dry No muscular weakness   Labs:   Lab Results  Component Value Date   WBC 7.5 11/12/2022   HGB 12.5 (L) 11/12/2022   HCT 39.8 11/12/2022   MCV 95.9 11/12/2022   PLT 235 11/12/2022    Recent Labs  Lab 11/12/22 1703  NA 138  K 3.8  CL 106  CO2 19*  BUN 11  CREATININE 1.08  CALCIUM 8.6*  PROT 5.7*  BILITOT 0.8  ALKPHOS 59  ALT 14  AST 17  GLUCOSE 79   Lab Results  Component Value Date   CKTOTAL 75 11/10/2016   TROPONINI 0.08 (HH) 11/28/2016    Lab Results  Component Value Date   CHOL 285 (H) 06/05/2021   CHOL 232 (H) 09/26/2020   CHOL 240 (H) 08/30/2019   Lab Results  Component Value Date   HDL 56.90 06/05/2021   HDL 50.30 09/26/2020   HDL 54.50 08/30/2019   Lab Results  Component Value Date   LDLCALC 207 (H) 06/05/2021   LDLCALC 148 (H) 09/26/2020   LDLCALC 161 (H) 08/30/2019   Lab Results  Component Value Date   TRIG 108.0 06/05/2021   TRIG 169.0 (H) 09/26/2020   TRIG 124.0 08/30/2019   Lab Results  Component Value Date   CHOLHDL 5 06/05/2021   CHOLHDL 5 09/26/2020  CHOLHDL 4 08/30/2019   Lab Results  Component Value Date   LDLDIRECT 139.5 09/01/2013   LDLDIRECT 151.9 08/30/2012   LDLDIRECT 168.9 09/03/2011      Radiology: DG Chest 2 View  Result Date: 11/12/2022 CLINICAL DATA:  Tachycardia EXAM: CHEST - 2 VIEW COMPARISON:  Chest x-ray dated August 01, 2022 FINDINGS: Cardiac and  mediastinal contours are within normal limits. Coarse bilateral reticular opacities, unchanged when compared with the prior exam. No consolidation. No evidence of pleural effusion or pneumothorax. 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 No consolidation. Electronically Signed   By: Yetta Glassman M.D.   On: 11/12/2022 17:43    EKG: Fib/flutter rate 144 no acute ischemic changes    ASSESSMENT AND PLAN:   PAF: unfortunately duration really not known so cannot simply DCC. Start eliquis 5 mg bid Change beta blocker to lopressor 50 mg bid. TTE when HR better controlled TEE [facility] this admission not ideal due to dysphagia with known esophageal stricture Labs including thyroid ok [place] VASC is 4  Dysphagia:  will have to delay any procedure until after PAF resolved Typically would not have to hold DOAC for dilatation but this would be upt to Dr Fuller Plan . Would need atleast 4 weeks of anticoagulation post conversion if procedure needs to be done off anticoagulation  HLD:  continue statin and zetia with history of coronary calcium and carotid plaque   Signed: Jenkins Rouge 11/12/2022, 7:11 PM

## 2022-11-12 NOTE — ED Provider Notes (Signed)
[place] Provider Note   CSN: [id number] Arrival date & time: 11/12/22  1618     History  Chief Complaint  Patient presents with   Tachycardia    Jason Farmer is a 88 y.o. male.  The history is provided by the patient and medical records.  Dizziness Quality:  Lightheadedness Severity:  Moderate Onset quality:  Gradual Duration:  4 days Timing:  Constant Progression:  Unchanged Chronicity:  New Relieved by:  Nothing Worsened by:  Nothing Ineffective treatments:  None tried Associated symptoms: no chest pain, no diarrhea, no headaches, no nausea, no palpitations, no shortness of breath and no weakness        Home Medications Prior to Admission medications   Medication Sig Start Date End Date Taking? Authorizing Provider  acetaminophen (TYLENOL) 325 MG tablet Take 325 mg by mouth every 6 (six) hours as needed for moderate pain or headache.    [provider]  atenolol (TENORMIN) 25 MG tablet Take 1/2 tablet (12.5 mg total) by mouth daily. 08/19/22   Nafziger, Tommi Rumps, NP  azelastine (ASTELIN) 0.1 % nasal spray Place 2 sprays into both nostrils at bedtime. Use in each nostril as directed    [provider]  Cholecalciferol (VITAMIN D3) 125 MCG (5000 UT) CAPS Take 5,000 Units by mouth daily.    [provider]  escitalopram (LEXAPRO) 10 MG tablet TAKE ONE TABLET BY MOUTH ONCE DAILY 08/19/22   Nafziger, Tommi Rumps, NP  ezetimibe (ZETIA) 10 MG tablet Take 1 tablet (10 mg total) by mouth daily. 06/13/22   Nafziger, Tommi Rumps, NP  latanoprost (XALATAN) 0.005 % ophthalmic solution Place 1 drop into both eyes at bedtime.     [provider]  mirtazapine (REMERON) 15 MG tablet TAKE ONE TABLET BY MOUTH AT BEDTIME 08/19/22   Nafziger, Tommi Rumps, NP  omeprazole (PRILOSEC) 20 MG capsule TAKE ONE CAPSULE BY MOUTH ONCE DAILY 08/19/22   Nafziger, Tommi Rumps, NP  simvastatin (ZOCOR) 10 MG tablet Take 1 tablet (10 mg total) by  mouth every other day. 06/13/22   Nafziger, Tommi Rumps, NP      Allergies    Statins and Aspirin    Review of Systems   Review of Systems  Constitutional:  Positive for fatigue. Negative for chills, diaphoresis and fever.  HENT:  Negative for congestion.   Eyes:  Negative for visual disturbance.  Respiratory:  Negative for cough, chest tightness and shortness of breath.   Cardiovascular:  Negative for chest pain, palpitations and leg swelling.  Gastrointestinal:  Negative for constipation, diarrhea and nausea.  Genitourinary:  Negative for dysuria, flank pain and frequency.  Musculoskeletal:  Negative for back pain.  Neurological:  Positive for light-headedness. Negative for dizziness, seizures, facial asymmetry, weakness, numbness and headaches.  Psychiatric/Behavioral:  Negative for confusion.   All other systems reviewed and are negative.   Physical Exam Updated Vital Signs BP (!) 146/86   Pulse 71   Temp 98.3 F (36.8 C)   Resp 16   Ht 5\' 8"  (1.727 m)   Wt 63 kg   SpO2 99%   BMI 21.13 kg/m  Physical Exam Vitals and nursing note reviewed.  Constitutional:      General: He is not in acute distress.    Appearance: He is well-developed. He is not ill-appearing, toxic-appearing or diaphoretic.  HENT:     Head: Normocephalic and atraumatic.     Nose: Nose normal.     Mouth/Throat:     Mouth:  Mucous membranes are dry.  Eyes:     Conjunctiva/sclera: Conjunctivae normal.  Cardiovascular:     Rate and Rhythm: Tachycardia present. Rhythm irregular.     Pulses: Normal pulses.     Heart sounds: Murmur heard.  Pulmonary:     Effort: Pulmonary effort is normal. No respiratory distress.     Breath sounds: Normal breath sounds. No wheezing, rhonchi or rales.  Chest:     Chest wall: No tenderness.  Abdominal:     General: Abdomen is flat.     Palpations: Abdomen is soft.     Tenderness: There is no abdominal tenderness.  Musculoskeletal:        General: No swelling, tenderness  or signs of injury.     Cervical back: Neck supple.     Right lower leg: No edema.     Left lower leg: No edema.  Skin:    General: Skin is warm and dry.     Capillary Refill: Capillary refill takes less than 2 seconds.     Findings: No erythema or rash.  Neurological:     General: No focal deficit present.     Mental Status: He is alert.  Psychiatric:        Mood and Affect: Mood normal.     ED Results / Procedures / Treatments   Labs (all labs ordered are listed, but only abnormal results are displayed) Labs Reviewed  CBC - Abnormal; Notable for the following components:      Result Value   RBC 4.15 (*)    Hemoglobin 12.5 (*)    All other components within normal limits  COMPREHENSIVE METABOLIC PANEL - Abnormal; Notable for the following components:   CO2 19 (*)    Calcium 8.6 (*)    Total Protein 5.7 (*)    Albumin 3.2 (*)    All other components within normal limits  MAGNESIUM  TSH  BRAIN NATRIURETIC PEPTIDE  LIPID PANEL  CBC  COMPREHENSIVE METABOLIC PANEL  MAGNESIUM  PHOSPHORUS  TROPONIN I (HIGH SENSITIVITY)  TROPONIN I (HIGH SENSITIVITY)    EKG EKG Interpretation  Date/Time:  Wednesday November 12 2022 17:35:26 EDT Ventricular Rate:  139 PR Interval:  65 QRS Duration: 136 QT Interval:  332 QTC Calculation: 505 R Axis:   -45 Text Interpretation: Sinus tachycardia Atrial premature complexes RBBB and LAFB when compared to ECG earlier, now fasrter rate. No STEMI Confirmed by Antony Blackbird [id number]) on 11/12/2022 5:37:42 PM  Radiology DG Chest 2 View  Result Date: 11/12/2022 CLINICAL DATA:  Tachycardia EXAM: CHEST - 2 VIEW COMPARISON:  Chest x-ray dated August 01, 2022 FINDINGS: Cardiac and mediastinal contours are within normal limits. Coarse bilateral reticular opacities, unchanged when compared with the prior exam. No consolidation. No evidence of pleural effusion or pneumothorax. IMPRESSION: Coarse reticular opacities, unchanged when compared with the prior  and likely due to fibrotic interstitial lung disease. No consolidation. Electronically Signed   By: Yetta Glassman M.D.   On: 11/12/2022 17:43    Procedures Procedures    Medications Ordered in ED Medications  sodium chloride 0.9 % bolus 1,000 mL (has no administration in time range)  diltiazem (CARDIZEM) 125 mg in dextrose 5% 125 mL (1 mg/mL) infusion ( Intravenous Not Given 11/12/22 1925)  apixaban (ELIQUIS) tablet 5 mg (has no administration in time range)  metoprolol tartrate (LOPRESSOR) tablet 50 mg (has no administration in time range)    ED Course/ Medical Decision Making/ A&P  Medical Decision Making Amount and/or Complexity of Data Reviewed Labs: ordered.  Risk Prescription drug management. Decision regarding hospitalization.    Jason Farmer is a 88 y.o. male with a past medical history significant for hyperlipidemia, hypertension, anxiety, previous TIA, and previous esophageal stricture who has had 2 previous esophageal dilations and was scheduled for esophageal dilation today and and preoperatively was found to have a heart rate in the 140s.  Patient reports he has felt fatigued for the last 4 days or so but does not feel palpitations.  He denies any chest pain or shortness of breath but is felt intermittent lightheadedness and fatigue.  He denies any history of A-fib or a flutter.  He denies any trauma.  He denies any fevers, chills, congestion, cough, nausea, vomiting, constipation, diarrhea, or urinary changes.  Denies any edema in extremities or other complaints.  Patient resting but heart rate is in the 140s.  EMS reportedly had him with a sinus rhythm in the 70s and he was reportedly given a beta-blocker at the surgical center.  Patient reports his remaining asymptomatic aside from the fatigue  On my exam, lungs clear.  Chest nontender.  He does have a murmur.  Abdomen nontender.  Good pulses in extremities.  No focal neurologic  deficits.  Mucous brains are dry and he reports he is not anything eat or drink while he was fasting for his surgery and he also was feeling dehydrated otherwise.  We tried vagal maneuver and it did not convert him to sinus.  On my EKG it appears to show A-fib with RVR and when I did the vagal it nearly showed what looked to be a flutter pattern.  I suspect A-fib with RVR.  We will give some fluids, get labs, and speak to cardiology.   Spoke to cardiology who agrees as looks like A-fib with RVR.  As it is unclear when this started as he has no palpitations and has had at least 4 or 5 days of fatigue and lightheadedness, they will need to give some fluids, start diltiazem drip, and admit to medicine.  They will see in consultation.  Do not feel he is a candidate for cardioversion at this time.  Will call medicine for admission as other workup was reassuring thus far.  7:24 PM Patient appears to have converted back to sinus rhythm after fluids started.  Will hold on giving him the diltiazem yet.  As patient is still feeling lightheaded and having the intermittent A-fib with RVR, will still plan on admission and cardiology evaluation.         Final Clinical Impression(s) / ED Diagnoses Final diagnoses:  Atrial fibrillation with RVR (HCC)  Lightheadedness    Clinical Impression: 1. Atrial fibrillation with RVR ([place])   2. Lightheadedness     Disposition: Admit  This note was prepared with assistance of Systems analyst. Occasional wrong-word or sound-a-like substitutions may have occurred due to the inherent limitations of voice recognition software.      Karmah Potocki, Gwenyth Allegra, MD 11/12/22 2352

## 2022-11-12 NOTE — Progress Notes (Signed)
VS completed by DT.  Pt's states no medical or surgical changes since previsit or office visit.  

## 2022-11-12 NOTE — H&P (Signed)
History and Physical  Jason Farmer [id number] DOB: February 13, 1935 DOA: 11/12/2022  Referring physician: Dr. Sherry Ruffing, [place]  PCP: Dorothyann Peng, NP  Outpatient Specialists: GI. Patient coming from: Home through preop visit  Chief Complaint: New onset atrial fibrillation with RVR.  HPI: Jason Farmer is a 88 y.o. male with medical history significant for hypertension, hyperlipidemia, chronic anxiety/depression, GERD, gastritis, multiple gastric polyps, medium sized hiatal hernia, history of esophageal stricture with suspected recurrence, esophageal dysphagia who initially presented to preop for evaluation prior to esophageal dilatation.  During preop the patient was noted to be tachycardic with a heart rate in the mid 40s.  Twelve-lead EKG revealed atrial fibrillation with RVR.  The patient was sent to the ED for further evaluation.  Arrhythmia was confirmed in the ED.  EDP discussed the case with cardiology who has seen the patient in consultation.  Per the patient he has been feeling woozy for the past 3 to 4 days.  Denies any palpitations, chest pain or dyspnea.  No known prior history of atrial fibrillation.  The patient was started on DOAC, Eliquis, p.o. Lopressor 50 mg twice daily, and diltiazem drip by cardiology.  A 2D echo has been ordered and is pending.  EDP requested admission for further workup.  The patient was admitted by [facility], hospitalist service for new onset paroxysmal A-fib with RVR.  At the time of this visit, the patient is asymptomatic.  Converted back to sinus rhythm.  Denies any chest pain, palpitations or dyspnea while at rest.  ED Course: Tmax 99.1.  BP 115/87, pulse 72, respiratory 16, saturation 96% on room air.  Lab studies remarkable for TSH 0.846, within normal range.  Serum bicarb 19.  Albumin 3.2.  Creatinine is 1.08 with GFR greater than 60.  CBC essentially unremarkable.  Review of Systems: Review of systems as noted in the HPI. All other systems reviewed and  are negative.   Past Medical History:  Diagnosis Date   Anxiety    "not a problem for a long time" (11/27/2016)   Arthritis    "wrists, knees, shoulders, back" (11/27/2016)   Chronic lower back pain    "5th disc is protruding" (11/27/2016)   Essential tremor    GERD (gastroesophageal reflux disease)    Glaucoma    Heart murmur    dx'd in [employer] in the 1960s; haven't been seen for it since "(11/27/2016)   Hepatitis ~ 1958   "jaundice kind"   HOH (hard of hearing)    Hyperlipemia    Migraine    "none since my neck OR" (11/27/2016)   Seasonal allergies    TIA (transient ischemic attack) 04/2015   Wears glasses    Wears hearing aid    Past Surgical History:  Procedure Laterality Date   ANTERIOR CERVICAL DECOMP/DISCECTOMY FUSION  2007   APPENDECTOMY     BACK SURGERY     CARPAL TUNNEL RELEASE Right 10/12/2013   Procedure: RIGHT CARPAL TUNNEL RELEASE;  Surgeon: Wynonia Sours, MD;  Location: [place];  Service: Orthopedics;  Laterality: Right;   CATARACT EXTRACTION W/ INTRAOCULAR LENS  IMPLANT, BILATERAL Bilateral    COLONOSCOPY     HEMORRHOID SURGERY     KNEE ARTHROSCOPY Left 1970s   NASAL SEPTOPLASTY W/ TURBINOPLASTY Bilateral 02/15/2013   Procedure: NASAL SEPTOPLASTY WITH BILATER TURBINATE REDUCTION;  Surgeon: Ascencion Dike, MD;  Location: [place];  Service: ENT;  Laterality: Bilateral;   TONSILLECTOMY     TRIGGER FINGER RELEASE  06/08/2012   Procedure: RELEASE TRIGGER FINGER/A-1 PULLEY;  Surgeon: Wynonia Sours, MD;  Location: [place];  Service: Orthopedics;  Laterality: Left;  Release A-1 Pulley Left Long Finger   TRIGGER FINGER RELEASE  07/14/2012   Procedure: RELEASE TRIGGER FINGER/A-1 PULLEY;  Surgeon: Wynonia Sours, MD;  Location: [place];  Service: Orthopedics;  Laterality: Right;   TRIGGER FINGER RELEASE Right 10/12/2013   Procedure: RELEASE TRIGGER FINGER/A-1 PULLEY RIGHT MIDDLE FINGER;  Surgeon: Wynonia Sours, MD;   Location: [place];  Service: Orthopedics;  Laterality: Right;   UPPER GASTROINTESTINAL ENDOSCOPY      Social History:  reports that he quit smoking about 38 years ago. His smoking use included cigarettes. He has a 60.00 pack-year smoking history. He has never used smokeless tobacco. He reports that he does not drink alcohol and does not use drugs.   Allergies  Allergen Reactions   Statins Other (See Comments)    Muscle cramps    Aspirin Other (See Comments)    bleeding    Family History  Problem Relation Age of Onset   Dementia Father    Heart failure Father    Glaucoma Mother    Colon cancer Neg Hx       Prior to Admission medications   Medication Sig Start Date End Date Taking? Authorizing Provider  acetaminophen (TYLENOL) 325 MG tablet Take 325 mg by mouth every 6 (six) hours as needed for moderate pain or headache.    [provider]  atenolol (TENORMIN) 25 MG tablet Take 1/2 tablet (12.5 mg total) by mouth daily. 08/19/22   Nafziger, Tommi Rumps, NP  azelastine (ASTELIN) 0.1 % nasal spray Place 2 sprays into both nostrils at bedtime. Use in each nostril as directed    [provider]  Cholecalciferol (VITAMIN D3) 125 MCG (5000 UT) CAPS Take 5,000 Units by mouth daily.    [provider]  escitalopram (LEXAPRO) 10 MG tablet TAKE ONE TABLET BY MOUTH ONCE DAILY 08/19/22   Nafziger, Tommi Rumps, NP  ezetimibe (ZETIA) 10 MG tablet Take 1 tablet (10 mg total) by mouth daily. 06/13/22   Nafziger, Tommi Rumps, NP  latanoprost (XALATAN) 0.005 % ophthalmic solution Place 1 drop into both eyes at bedtime.     [provider]  mirtazapine (REMERON) 15 MG tablet TAKE ONE TABLET BY MOUTH AT BEDTIME 08/19/22   Nafziger, Tommi Rumps, NP  omeprazole (PRILOSEC) 20 MG capsule TAKE ONE CAPSULE BY MOUTH ONCE DAILY 08/19/22   Nafziger, Tommi Rumps, NP  simvastatin (ZOCOR) 10 MG tablet Take 1 tablet (10 mg total) by mouth every other day. 06/13/22   Dorothyann Peng, NP     Physical Exam: BP 115/87   Pulse 72   Temp 99 F (37.2 C) (Oral)   Resp 16   Ht 5\' 8"  (1.727 m)   Wt 63 kg   SpO2 96%   BMI 21.13 kg/m   General: 88 y.o. year-old male well developed well nourished in no acute distress.  Alert and oriented x3. Cardiovascular: Regular rate and rhythm with no rubs or gallops.  No thyromegaly or JVD noted.  No lower extremity edema. 2/4 pulses in all 4 extremities. Respiratory: Clear to auscultation with no wheezes or rales. Good inspiratory effort. Abdomen: Soft nontender nondistended with normal bowel sounds x4 quadrants. Muskuloskeletal: No cyanosis, clubbing or edema noted bilaterally Neuro: CN II-XII intact, strength, sensation, reflexes Skin: No ulcerative lesions noted or rashes Psychiatry: Judgement and insight appear normal. Mood is  appropriate for condition and setting          Labs on Admission:  Basic Metabolic Panel: Recent Labs  Lab 11/12/22 1703  NA 138  K 3.8  CL 106  CO2 19*  GLUCOSE 79  BUN 11  CREATININE 1.08  CALCIUM 8.6*  MG 1.8   Liver Function Tests: Recent Labs  Lab 11/12/22 1703  AST 17  ALT 14  ALKPHOS 59  BILITOT 0.8  PROT 5.7*  ALBUMIN 3.2*   No results for input(s): "LIPASE", "AMYLASE" in the last 168 hours. No results for input(s): "AMMONIA" in the last 168 hours. CBC: Recent Labs  Lab 11/12/22 1703  WBC 7.5  HGB 12.5*  HCT 39.8  MCV 95.9  PLT 235   Cardiac Enzymes: No results for input(s): "CKTOTAL", "CKMB", "CKMBINDEX", "TROPONINI" in the last 168 hours.  BNP (last 3 results) Recent Labs    08/01/22 1130 11/12/22 1703  BNP 56.8 81.7    ProBNP (last 3 results) No results for input(s): "PROBNP" in the last 8760 hours.  CBG: No results for input(s): "GLUCAP" in the last 168 hours.  Radiological Exams on Admission: DG Chest 2 View  Result Date: 11/12/2022 CLINICAL DATA:  Tachycardia EXAM: CHEST - 2 VIEW COMPARISON:  Chest x-ray dated August 01, 2022 FINDINGS: Cardiac and  mediastinal contours are within normal limits. Coarse bilateral reticular opacities, unchanged when compared with the prior exam. No consolidation. No evidence of pleural effusion or pneumothorax. 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 No consolidation. Electronically Signed   By: Yetta Glassman M.D.   On: 11/12/2022 17:43    EKG: I independently viewed the EKG done and my findings are as followed: Normal sinus rhythm from A-fib with RVR, rate of 77.  RBBB, nonspecific ST-T changes.  QTc 453.  Assessment/Plan Present on Admission:  New onset atrial fibrillation [facility])  Principal Problem:   New onset atrial fibrillation ([place])  New onset paroxysmal A-fib with RVR Seen by cardiology TSH within normal range Started on p.o. Lopressor 50 mg twice daily CHA2DS2-VASc of 4, started on DOAC, Eliquis Cardizem drip per cardiology Follow 2D echo Closely monitor on telemetry Rest of management per cardiology  Hyperlipidemia Obtain fasting lipid panel Resume home simvastatin  Essential hypertension Home atenolol changed to p.o. Lopressor 50 mg twice daily Closely monitor vital signs  Chronic anxiety/depression Resume home escitalopram, mirtazapine.  GERD Resume home omeprazole  History of esophageal stricture recurrence/esophageal dysphagia Started on dysphagia 3 diet Start aspiration precautions Rest of management per GI   DVT prophylaxis: DOAC, Eliquis.  Code Status: Full code estimated patient himself.  Family Communication: Updated his daughter at bedside.  Disposition Plan: Admitted to telemetry cardiac unit.  Consults called: Cardiology, GI.  Admission status: Observation status   Status is: Observation    Kayleen Memos MD [facility] Pager [phone]  If 7PM-7AM, please contact night-coverage [email or web address] Password [facility]  11/12/2022, 9:58 PM

## 2022-11-13 ENCOUNTER — Other Ambulatory Visit: Payer: Self-pay | Admitting: Cardiology

## 2022-11-13 ENCOUNTER — Observation Stay (HOSPITAL_BASED_OUTPATIENT_CLINIC_OR_DEPARTMENT_OTHER): Payer: Medicare Other

## 2022-11-13 DIAGNOSIS — I4891 Unspecified atrial fibrillation: Secondary | ICD-10-CM

## 2022-11-13 DIAGNOSIS — Z789 Other specified health status: Secondary | ICD-10-CM

## 2022-11-13 LAB — COMPREHENSIVE METABOLIC PANEL
ALT: 16 U/L (ref 0–44)
AST: 18 U/L (ref 15–41)
Albumin: 2.9 g/dL — ABNORMAL LOW (ref 3.5–5.0)
Alkaline Phosphatase: 53 U/L (ref 38–126)
Anion gap: 5 (ref 5–15)
BUN: 12 mg/dL (ref 8–23)
CO2: 25 mmol/L (ref 22–32)
Calcium: 8.4 mg/dL — ABNORMAL LOW (ref 8.9–10.3)
Chloride: 107 mmol/L (ref 98–111)
Creatinine, Ser: 1.2 mg/dL (ref 0.61–1.24)
GFR, Estimated: 59 mL/min — ABNORMAL LOW (ref >60)
Glucose, Bld: 108 mg/dL — ABNORMAL HIGH (ref 70–99)
Potassium: 4 mmol/L (ref 3.5–5.1)
Sodium: 137 mmol/L (ref 135–145)
Total Bilirubin: 0.6 mg/dL (ref 0.3–1.2)
Total Protein: 5.3 g/dL — ABNORMAL LOW (ref 6.5–8.1)

## 2022-11-13 LAB — CBC
HCT: 38 % — ABNORMAL LOW (ref 39.0–52.0)
Hemoglobin: 12.1 g/dL — ABNORMAL LOW (ref 13.0–17.0)
MCH: 30 pg (ref 26.0–34.0)
MCHC: 31.8 g/dL (ref 30.0–36.0)
MCV: 94.1 fL (ref 80.0–100.0)
Platelets: 225 10*3/uL (ref 150–400)
RBC: 4.04 MIL/uL — ABNORMAL LOW (ref 4.22–5.81)
RDW: 12.9 % (ref 11.5–15.5)
WBC: 5.8 10*3/uL (ref 4.0–10.5)
nRBC: 0 % (ref 0.0–0.2)

## 2022-11-13 LAB — ECHOCARDIOGRAM COMPLETE
AR max vel: 1.96 cm2
AV Area VTI: 1.99 cm2
AV Area mean vel: 1.93 cm2
AV Mean grad: 4 mmHg
AV Peak grad: 8.5 mmHg
Ao pk vel: 1.46 m/s
Area-P 1/2: 3.1 cm2
Calc EF: 61.5 %
Height: 68.5 in
S' Lateral: 2.9 cm
Single Plane A2C EF: 47.7 %
Single Plane A4C EF: 72.8 %
Weight: 2215.18 oz

## 2022-11-13 LAB — LIPID PANEL
Cholesterol: 193 mg/dL (ref 0–200)
HDL: 47 mg/dL (ref >40)
LDL Cholesterol: 128 mg/dL — ABNORMAL HIGH (ref 0–99)
Total CHOL/HDL Ratio: 4.1 RATIO
Triglycerides: 92 mg/dL (ref <150)
VLDL: 18 mg/dL (ref 0–40)

## 2022-11-13 LAB — PHOSPHORUS: Phosphorus: 3.9 mg/dL (ref 2.5–4.6)

## 2022-11-13 LAB — MAGNESIUM: Magnesium: 2 mg/dL (ref 1.7–2.4)

## 2022-11-13 MED ORDER — METOPROLOL TARTRATE 50 MG PO TABS: 50.0000 mg | ORAL_TABLET | Freq: Two times a day (BID) | ORAL | 0 refills | Status: DC

## 2022-11-13 MED ORDER — APIXABAN 5 MG PO TABS: 5.0000 mg | ORAL_TABLET | Freq: Two times a day (BID) | ORAL | 0 refills | Status: DC

## 2022-11-13 NOTE — Discharge Summary (Signed)
Physician Discharge Summary  [name] [id number] DOB: 05-11-1935 DOA: 11/12/2022  PCP: Dorothyann Peng, NP  Admit date: 11/12/2022 Discharge date: 11/13/2022  Admitted From: Home Disposition: Home  Recommendations for Outpatient Follow-up:  She will schedule follow-up GI will schedule follow-up  Home Health: N/A Equipment/Devices: N/A  Discharge Condition: Stable CODE STATUS: Full code Diet recommendation: Low-salt diet, soft dysphagia diet as you are doing.  Discharge summary: 88 year old gentleman with history of hypertension, hyperlipidemia, chronic anxiety depression, gastritis, esophageal stricture status post previous esophageal dilatation who was at preop and getting ready to get anesthesia for esophageal dilatation with upper GI endoscopy, found to have tachycardia with fluctuating heart rate, atrial fibrillation.  Due to new diagnosis of arrhythmia, patient was transferred to ER.  Patient was complaining of feeling woozy for the last 4 days but no new symptoms.  Not known to have atrial fibrillation.  Denies any history of palpitations or chest pain.  Asymptomatic rapid A-fib:  He was initially started on Lopressor 50 mg twice daily, Cardizem drip, started on Eliquis, seen by cardiology.  Overnight converted to normal sinus rhythm and maintaining normal sinus rhythm now.  Therapeutic on Eliquis. 2D echocardiogram with normal ejection fraction, grade 2 diastolic dysfunction. TSH normal Electrolytes normal As per cardiology recommendations, going home with metoprolol, Eliquis.  Dysphagia and esophageal strictures: Currently stable.  GI will reschedule procedure.  Chronic medical issues, no change in medications.  Stable for discharge.    Discharge Diagnoses:  Principal Problem:   New onset atrial fibrillation [facility])    Discharge Instructions  Discharge Instructions     Diet - low sodium heart healthy   Complete by: As directed    Increase activity slowly    Complete by: As directed       Allergies as of 11/13/2022       Reactions   Statins Other (See Comments)   Muscle cramps   Aspirin Other (See Comments)   bleeding        Medication List     STOP taking these medications    atenolol 25 MG tablet Commonly known as: TENORMIN       TAKE these medications    acetaminophen 325 MG tablet Commonly known as: TYLENOL Take 325 mg by mouth every 6 (six) hours as needed for moderate pain or headache.   apixaban 5 MG Tabs tablet Commonly known as: ELIQUIS Take 1 tablet (5 mg total) by mouth 2 (two) times daily.   azelastine 0.1 % nasal spray Commonly known as: ASTELIN Place 2 sprays into both nostrils 3 times/day as needed-between meals & bedtime for allergies. Use in each nostril as directed   escitalopram 10 MG tablet Commonly known as: LEXAPRO TAKE ONE TABLET BY MOUTH ONCE DAILY   ezetimibe 10 MG tablet Commonly known as: ZETIA Take 1 tablet (10 mg total) by mouth daily.   latanoprost 0.005 % ophthalmic solution Commonly known as: XALATAN Place 1 drop into both eyes at bedtime.   metoprolol tartrate 50 MG tablet Commonly known as: LOPRESSOR Take 1 tablet (50 mg total) by mouth 2 (two) times daily.   mirtazapine 15 MG tablet Commonly known as: REMERON TAKE ONE TABLET BY MOUTH AT BEDTIME   omeprazole 20 MG capsule Commonly known as: PRILOSEC TAKE ONE CAPSULE BY MOUTH ONCE DAILY   simvastatin 10 MG tablet Commonly known as: ZOCOR Take 1 tablet (10 mg total) by mouth every other day.   Vitamin D3 125 MCG (5000 UT) capsule Generic drug: Cholecalciferol  Take 5,000 Units by mouth daily.        Allergies  Allergen Reactions   Statins Other (See Comments)    Muscle cramps    Aspirin Other (See Comments)    bleeding    Consultations: Cardiology   Procedures/Studies: ECHOCARDIOGRAM COMPLETE  Result Date: 11/13/2022    ECHOCARDIOGRAM REPORT   Patient Name:   Jason Farmer Date of Exam:  11/13/2022 Medical Rec #:  [id number]        Height:       68.5 in Accession #:    [id number]       Weight:       138.4 lb Date of Birth:  10-11-1934         BSA:          1.758 m Patient Age:    88 years         BP:           143/74 mmHg Patient Gender: M                HR:           64 bpm. Exam Location:  Inpatient Procedure: Cardiac Doppler, Color Doppler and 2D Echo Indications:    A-Fib  History:        Patient has prior history of Echocardiogram examinations, most                 recent 08/29/2010. Arrythmias:Atrial Fibrillation; Risk                 Factors:Hyperlipidimia and Hypertension.  Sonographer:    Marella Chimes Referring Phys: [place]  1. Left ventricular ejection fraction, by estimation, is 55 to 60%. The left ventricle has normal function. The left ventricle has no regional wall motion abnormalities. Left ventricular diastolic parameters are consistent with Grade II diastolic dysfunction (pseudonormalization).  2. Right ventricular systolic function is normal. The right ventricular size is mildly enlarged. There is normal pulmonary artery systolic pressure.  3. Left atrial size was mildly dilated.  4. Right atrial size was mildly dilated.  5. The mitral valve is normal in structure. No evidence of mitral valve regurgitation. No evidence of mitral stenosis.  6. Tricuspid valve regurgitation is moderate.  7. The aortic valve is normal in structure. Aortic valve regurgitation is not visualized. No aortic stenosis is present.  8. The inferior vena cava is normal in size with greater than 50% respiratory variability, suggesting right atrial pressure of 3 mmHg. FINDINGS  Left Ventricle: Left ventricular ejection fraction, by estimation, is 55 to 60%. The left ventricle has normal function. The left ventricle has no regional wall motion abnormalities. The left ventricular internal cavity size was normal in size. There is  no left ventricular hypertrophy. Left ventricular diastolic  parameters are consistent with Grade II diastolic dysfunction (pseudonormalization). Right Ventricle: The right ventricular size is mildly enlarged. No increase in right ventricular wall thickness. Right ventricular systolic function is normal. There is normal pulmonary artery systolic pressure. The tricuspid regurgitant velocity is 2.48  m/s, and with an assumed right atrial pressure of 3 mmHg, the estimated right ventricular systolic pressure is 0000000 mmHg. Left Atrium: Left atrial size was mildly dilated. Right Atrium: Right atrial size was mildly dilated. Pericardium: There is no evidence of pericardial effusion. Mitral Valve: The mitral valve is normal in structure. Mild mitral annular calcification. No evidence of mitral valve regurgitation. No evidence of mitral valve stenosis. Tricuspid Valve: The  tricuspid valve is normal in structure. Tricuspid valve regurgitation is moderate . No evidence of tricuspid stenosis. Aortic Valve: The aortic valve is normal in structure. Aortic valve regurgitation is not visualized. No aortic stenosis is present. Aortic valve mean gradient measures 4.0 mmHg. Aortic valve peak gradient measures 8.5 mmHg. Aortic valve area, by VTI measures 1.99 cm. Pulmonic Valve: The pulmonic valve was normal in structure. Pulmonic valve regurgitation is trivial. No evidence of pulmonic stenosis. Aorta: The aortic root is normal in size and structure. Venous: The inferior vena cava is normal in size with greater than 50% respiratory variability, suggesting right atrial pressure of 3 mmHg. IAS/Shunts: No atrial level shunt detected by color flow Doppler.  LEFT VENTRICLE PLAX 2D LVIDd:         4.00 cm     Diastology LVIDs:         2.90 cm     LV e' medial:    6.20 cm/s LV PW:         1.00 cm     LV E/e' medial:  16.5 LV IVS:        0.80 cm     LV e' lateral:   7.72 cm/s LVOT diam:     1.90 cm     LV E/e' lateral: 13.2 LV SV:         63 LV SV Index:   36 LVOT Area:     2.84 cm  LV Volumes (MOD) LV  vol d, MOD A2C: 66.6 ml LV vol d, MOD A4C: 84.6 ml LV vol s, MOD A2C: 34.8 ml LV vol s, MOD A4C: 23.0 ml LV SV MOD A2C:     31.8 ml LV SV MOD A4C:     84.6 ml LV SV MOD BP:      46.9 ml RIGHT VENTRICLE RV S prime:     8.59 cm/s TAPSE (M-mode): 2.2 cm LEFT ATRIUM             Index        RIGHT ATRIUM           Index LA Vol (A2C):   77.1 ml 43.87 ml/m  RA Area:     19.60 cm LA Vol (A4C):   47.4 ml 26.97 ml/m  RA Volume:   61.80 ml  35.16 ml/m LA Biplane Vol: 61.8 ml 35.16 ml/m  AORTIC VALVE AV Area (Vmax):    1.96 cm AV Area (Vmean):   1.93 cm AV Area (VTI):     1.99 cm AV Vmax:           146.00 cm/s AV Vmean:          92.600 cm/s AV VTI:            0.315 m AV Peak Grad:      8.5 mmHg AV Mean Grad:      4.0 mmHg LVOT Vmax:         101.00 cm/s LVOT Vmean:        62.900 cm/s LVOT VTI:          0.221 m LVOT/AV VTI ratio: 0.70  AORTA Ao Root diam: 3.50 cm Ao Asc diam:  3.30 cm MITRAL VALVE                TRICUSPID VALVE MV Area (PHT): 3.10 cm     TR Peak grad:   24.6 mmHg MV Decel Time: 245 msec     TR Vmax:  248.00 cm/s MV E velocity: 102.00 cm/s MV A velocity: 70.70 cm/s   SHUNTS MV E/A ratio:  1.44         Systemic VTI:  0.22 m                             Systemic Diam: 1.90 cm Kardie Tobb DO Electronically signed by Berniece Salines DO Signature Date/Time: 11/13/2022/2:38:26 PM    Final    DG Chest 2 View  Result Date: 11/12/2022 CLINICAL DATA:  Tachycardia EXAM: CHEST - 2 VIEW COMPARISON:  Chest x-ray dated August 01, 2022 FINDINGS: Cardiac and mediastinal contours are within normal limits. Coarse bilateral reticular opacities, unchanged when compared with the prior exam. No consolidation. No evidence of pleural effusion or pneumothorax. 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 No consolidation. Electronically Signed   By: Yetta Glassman M.D.   On: 11/12/2022 17:43   (Echo, Carotid, EGD, Colonoscopy, ERCP)     Subjective: Patient seen in the morning rounds.  Denies any complaints.  Telemetry monitor with sinus rhythm and heart rate mostly 50-60.  Was transiently on Cardizem drip last night at the emergency room but none since admission.   Discharge Exam: Vitals:   11/13/22 0755 11/13/22 1043  BP: 134/74 (!) 109/46  Pulse: (!) 53 61  Resp: 14   Temp: 98 F (36.7 C) 98 F (36.7 C)  SpO2: 98% 97%   Vitals:   11/13/22 0423 11/13/22 0500 11/13/22 0755 11/13/22 1043  BP: (!) 100/52  134/74 (!) 109/46  Pulse: (!) 56  (!) 53 61  Resp: 20  14   Temp: 98 F (36.7 C)  98 F (36.7 C) 98 F (36.7 C)  TempSrc: Oral  Oral Oral  SpO2: 94%  98% 97%  Weight:  62.8 kg    Height:        General: Pt is alert, awake, not in acute distress Looks younger than his stated age. Cardiovascular: RRR, S1/S2 +, no rubs, no gallops Respiratory: CTA bilaterally, no wheezing, no rhonchi Abdominal: Soft, NT, ND, bowel sounds + Extremities: no edema, no cyanosis    The results of significant diagnostics from this hospitalization (including imaging, microbiology, ancillary and laboratory) are listed below for reference.     Microbiology: No results found for this or any previous visit (from the past 240 hour(s)).   Labs: BNP (last 3 results) Recent Labs    08/01/22 1130 11/12/22 1703  BNP 56.8 [id number]   Basic Metabolic Panel: Recent Labs  Lab 11/12/22 1703 11/13/22 0029  NA 138 137  K 3.8 4.0  CL 106 107  CO2 19* 25  GLUCOSE 79 108*  BUN 11 12  CREATININE 1.08 1.20  CALCIUM 8.6* 8.4*  MG 1.8 2.0  PHOS  --  3.9   Liver Function Tests: Recent Labs  Lab 11/12/22 1703 11/13/22 0029  AST 17 18  ALT 14 16  ALKPHOS 59 53  BILITOT 0.8 0.6  PROT 5.7* 5.3*  ALBUMIN 3.2* 2.9*   No results for input(s): "LIPASE", "AMYLASE" in the last 168 hours. No results for input(s): "AMMONIA" in the last 168 hours. CBC: Recent Labs  Lab 11/12/22 1703 11/13/22 0029  WBC 7.5 5.8  HGB 12.5* 12.1*   HCT 39.8 38.0*  MCV 95.9 94.1  PLT 235 225   Cardiac Enzymes: No results for input(s): "CKTOTAL", "CKMB", "CKMBINDEX", "TROPONINI" in  the last 168 hours. BNP: Invalid input(s): "POCBNP" CBG: No results for input(s): "GLUCAP" in the last 168 hours. D-Dimer No results for input(s): "DDIMER" in the last 72 hours. Hgb A1c No results for input(s): "HGBA1C" in the last 72 hours. Lipid Profile Recent Labs    11/13/22 0029  CHOL 193  HDL 47  LDLCALC 128*  TRIG 92  CHOLHDL 4.1   Thyroid function studies Recent Labs    11/12/22 1703  TSH 0.846   Anemia work up No results for input(s): "VITAMINB12", "FOLATE", "FERRITIN", "TIBC", "IRON", "RETICCTPCT" in the last 72 hours. Urinalysis    Component Value Date/Time   COLORURINE YELLOW 03/20/2022 1511   APPEARANCEUR CLEAR 03/20/2022 1511   LABSPEC 1.020 03/20/2022 1511   PHURINE 5.5 03/20/2022 1511   GLUCOSEU NEGATIVE 03/20/2022 1511   HGBUR SMALL (A) 03/20/2022 1511   BILIRUBINUR NEGATIVE 03/20/2022 1511   BILIRUBINUR n 09/16/2016 1048   KETONESUR NEGATIVE 03/20/2022 1511   PROTEINUR NEGATIVE 04/02/2021 0328   UROBILINOGEN 0.2 03/20/2022 1511   NITRITE NEGATIVE 03/20/2022 1511   LEUKOCYTESUR NEGATIVE 03/20/2022 1511   Sepsis Labs Recent Labs  Lab 11/12/22 1703 11/13/22 0029  WBC 7.5 5.8   Microbiology No results found for this or any previous visit (from the past 240 hour(s)).   Time coordinating discharge:  35 minutes  SIGNED:   Barb Merino, MD  [facility] 11/13/2022, 3:24 PM

## 2022-11-13 NOTE — Progress Notes (Signed)
  Echocardiogram 2D Echocardiogram has been performed.  Jason Farmer [name] 11/13/2022, 2:00 PM

## 2022-11-13 NOTE — TOC Progression Note (Signed)
Transition of Care [facility]) - Progression Note    Patient Details  Name: Jason Farmer MRN: [id number] Date of Birth: [birth year]  Transition of Care [facility]) CM/SW Contact  Zenon Mayo, RN Phone Number: 11/13/2022, 10:01 AM  Clinical Narrative:     Transition of Care [facility]) Screening Note   Patient Details  Name: Jason Farmer Date of Birth: September 09, 1934   Transition of Care [facility]) CM/SW Contact:    Zenon Mayo, RN Phone Number: 11/13/2022, 10:01 AM    Transition of Care Department [facility]) has reviewed patient and no TOC needs have been identified at this time. We will continue to monitor patient advancement through interdisciplinary progression rounds. If new patient transition needs arise, please place a TOC consult.   from IDL, afib RVR, for echo today, daughter will transport home          Expected Discharge Plan and Services                                               Social Determinants of Health (SDOH) Interventions SDOH Screenings   Food Insecurity: No Food Insecurity (12/31/2021)  Housing: Low Risk  (11/28/2021)  Transportation Needs: No Transportation Needs (06/09/2022)  Depression (PHQ2-9): High Risk (03/20/2022)  Financial Resource Strain: Low Risk  (12/31/2021)  Physical Activity: Inactive (12/31/2021)  Social Connections: Moderately Integrated (11/28/2021)  Stress: No Stress Concern Present (12/31/2021)  Recent Concern: Stress - Stress Concern Present (11/28/2021)  Tobacco Use: Medium Risk (11/12/2022)    Readmission Risk Interventions     No data to display

## 2022-11-13 NOTE — Progress Notes (Signed)
Mobility Specialist - Progress Note   11/13/22 1300  Mobility  Activity Ambulated independently in hallway  Level of Assistance Independent  Assistive Device None  Distance Ambulated (ft) 1000 ft  Activity Response Tolerated well  Mobility Referral Yes  $Mobility charge 1 Mobility    Pt received in bed and agreeable to mobility. No complaints throughout, no rest breaks or physical assistance needed. Returned to room and left sitting EOB w/ all needs met.   [place] Specialist Please contact via SecureChat or Rehab office at [phone]

## 2022-11-13 NOTE — Progress Notes (Addendum)
Rounding Note    Patient Name: Jason Farmer Date of Encounter: 11/13/2022  [place] Cardiologist: None   Subjective   Patient resting comfortably in bed. Denies chest pain, palpitations, shortness of breath. His only complaint this morning is a "foggy headed" sensation, not exactly described as dizziness.  Inpatient Medications    Scheduled Meds:  apixaban  5 mg Oral BID   azelastine  2 spray Each Nare QHS   cholecalciferol  5,000 Units Oral Daily   escitalopram  10 mg Oral Daily   ezetimibe  10 mg Oral Daily   latanoprost  1 drop Both Eyes QHS   metoprolol tartrate  50 mg Oral BID   mirtazapine  15 mg Oral QHS   pantoprazole  40 mg Oral Daily   simvastatin  10 mg Oral QODAY   Continuous Infusions:  PRN Meds: acetaminophen, melatonin, polyethylene glycol, prochlorperazine   Vital Signs    Vitals:   11/12/22 2358 11/13/22 0423 11/13/22 0500 11/13/22 0755  BP: 129/76 (!) 100/52  134/74  Pulse: 62 (!) 56  (!) 53  Resp: 17 20  14   Temp: 98.3 F (36.8 C) 98 F (36.7 C)  98 F (36.7 C)  TempSrc: Oral Oral  Oral  SpO2: 97% 94%  98%  Weight: 62.8 kg  62.8 kg   Height: 5' 8.5" (1.74 m)       Intake/Output Summary (Last 24 hours) at 11/13/2022 0931 Last data filed at 11/13/2022 0906 Gross per 24 hour  Intake 1413.94 ml  Output --  Net 1413.94 ml      11/13/2022    5:00 AM 11/12/2022   11:58 PM 11/12/2022    5:01 PM  Last 3 Weights  Weight (lbs) 138 lb 7.2 oz 138 lb 7.2 oz 139 lb  Weight (kg) 62.8 kg 62.8 kg 63.05 kg      Telemetry    Sinus rhythm with paroxysmal atrial fib/flutter - Personally Reviewed  ECG    No new tracing - Personally Reviewed  Physical Exam   GEN: No acute distress.   Neck: No JVD Cardiac: RRR, no murmurs, rubs, or gallops.  Respiratory: Clear to auscultation bilaterally. GI: Soft, nontender, non-distended  MS: No edema; No deformity. Neuro:  Nonfocal  Psych: Normal affect   Labs    High Sensitivity  Troponin:   Recent Labs  Lab 11/12/22 1703 11/12/22 1841  TROPONINIHS 10 14     Chemistry Recent Labs  Lab 11/12/22 1703 11/13/22 0029  NA 138 137  K 3.8 4.0  CL 106 107  CO2 19* 25  GLUCOSE 79 108*  BUN 11 12  CREATININE 1.08 1.20  CALCIUM 8.6* 8.4*  MG 1.8 2.0  PROT 5.7* 5.3*  ALBUMIN 3.2* 2.9*  AST 17 18  ALT 14 16  ALKPHOS 59 53  BILITOT 0.8 0.6  GFRNONAA >60 59*  ANIONGAP 13 5    Lipids  Recent Labs  Lab 11/13/22 0029  CHOL 193  TRIG 92  HDL 47  LDLCALC 128*  CHOLHDL 4.1    Hematology Recent Labs  Lab 11/12/22 1703 11/13/22 0029  WBC 7.5 5.8  RBC 4.15* 4.04*  HGB 12.5* 12.1*  HCT 39.8 38.0*  MCV 95.9 94.1  MCH 30.1 30.0  MCHC 31.4 31.8  RDW 12.8 12.9  PLT 235 225   Thyroid  Recent Labs  Lab 11/12/22 1703  TSH 0.846    BNP Recent Labs  Lab 11/12/22 1703  BNP 81.7    DDimer  No results for input(s): "DDIMER" in the last 168 hours.   Radiology    DG Chest 2 View  Result Date: 11/12/2022 CLINICAL DATA:  Tachycardia EXAM: CHEST - 2 VIEW COMPARISON:  Chest x-ray dated August 01, 2022 FINDINGS: Cardiac and mediastinal contours are within normal limits. Coarse bilateral reticular opacities, unchanged when compared with the prior exam. No consolidation. No evidence of pleural effusion or pneumothorax. 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 No consolidation. Electronically Signed   By: Yetta Glassman M.D.   On: 11/12/2022 17:43    Cardiac Studies   11/28/2016 NM Stress Test  There was no ST segment deviation noted during stress. This is a low risk study. Nuclear stress EF: 64%. No T wave inversion was noted during stress.   No reversible ischemia. LVEF 64% with normal wall motion. This is a low risk study.  Patient Profile     Jason Farmer is a 88 y.o. male with medical history significant for hypertension, hyperlipidemia, chronic  anxiety/depression, GERD, gastritis, multiple gastric polyps, medium sized hiatal hernia, history of esophageal stricture with suspected recurrence, esophageal dysphagia. Patient presented for esophageal dilation and was found to be in afib with RVR.  Assessment & Plan    Paroxysmal atrial fibrillation with RVR (new)  Patient presented yesterday for esophageal dilation pre-op and was incidentally found to be in afib with RVR. Beta blocker was increased to Metoprolol Tartrate 50mg  BID and Eliquis initiated.   Patient with paroxysmal afib overnight on telemetry, overall well rate controlled. Would continue metoprolol at current dose. Continue Eliquis.  Could consider static heart monitor at discharge but given good telemetry data this admission, likely unnecessary.  Dizziness/"fogginess"  Patient reporting sensation of feeling foggy, head fullness. Does not appear to have cardiac etiology.  Check orthostatics.  Dysphagia  Patient is now rate controlled, currently in NSR. Could now undergo esophageal dilation.   Hyperlipidemia Coronary artery calcification  LDL above goal of 70. Given reported intolerance to higher dose statins, would consider referral to lipid clinic for initiation of PCSK9. Continue Simvastatin and Zetia.  Lab Results  Component Value Date   CHOL 193 11/13/2022   HDL 47 11/13/2022   LDLCALC 128 (H) 11/13/2022   LDLDIRECT 139.5 09/01/2013   TRIG 92 11/13/2022   CHOLHDL 4.1 11/13/2022           For questions or updates, please contact [place] Please consult [email or web address] for contact info under        Signed, Lily Kocher, PA-C  11/13/2022, 9:31 AM    Personally seen and examined. Agree with above.  Feels better. Maybe a little whoozy past few days. Daughter in room.   RRR, thin  PAF  - new. Metop 50 BID, improved. Now in NSR tele reviewed. No need for monitor at home. If BP too low, can decrease to 25 BID.  - Awaiting ECHO.    Dysphagia  - may undergo balloon dilitation. However please perform on Eliquis. If not able, let's wait for at least one month before holding the Eliquis for procedure.   CAD  - will set up with lipid clinic for PCSK9. LDL not at goal.   OK with DC after ECHO read. Dr. Johnsie Cancel or APP follow up.   Candee Furbish, MD

## 2022-11-13 NOTE — TOC Transition Note (Addendum)
Transition of Care [facility]) - CM/SW Discharge Note   Patient Details  Name: Jason Farmer MRN: [id number] Date of Birth: 05/22/1935  Transition of Care [facility]) CM/SW Contact:  Zenon Mayo, RN Phone Number: 11/13/2022, 3:27 PM   Clinical Narrative:    From [place] IDL with wife, has PCP, has presents with afib rvr, had echo today, for dc today, daughter is at the bedside and will transport him home today. NCM gave him the eliquis 30 day  coupon.  Pharmacist at [facility] spoke with daughter and she will bring patient's insurance card by pharmacy.          Patient Goals and CMS Choice      Discharge Placement                         Discharge Plan and Services Additional resources added to the After Visit Summary for                                       Social Determinants of Health (SDOH) Interventions SDOH Screenings   Food Insecurity: No Food Insecurity (12/31/2021)  Housing: Low Risk  (11/28/2021)  Transportation Needs: No Transportation Needs (06/09/2022)  Depression (PHQ2-9): High Risk (03/20/2022)  Financial Resource Strain: Low Risk  (12/31/2021)  Physical Activity: Inactive (12/31/2021)  Social Connections: Moderately Integrated (11/28/2021)  Stress: No Stress Concern Present (12/31/2021)  Recent Concern: Stress - Stress Concern Present (11/28/2021)  Tobacco Use: Medium Risk (11/12/2022)     Readmission Risk Interventions     No data to display

## 2022-11-14 ENCOUNTER — Telehealth: Payer: Self-pay | Admitting: *Deleted

## 2022-11-14 NOTE — Telephone Encounter (Signed)
A message was left re" scheduling an appointment with the pharm-D.

## 2022-11-22 NOTE — Progress Notes (Signed)
PT had new fatigue and tahycardia/new arrythmia. Was evaluating for heart strain or failure.

## 2022-11-29 ENCOUNTER — Other Ambulatory Visit: Payer: Self-pay | Admitting: Adult Health

## 2022-11-29 DIAGNOSIS — F32A Depression, unspecified: Secondary | ICD-10-CM

## 2022-11-29 DIAGNOSIS — R634 Abnormal weight loss: Secondary | ICD-10-CM

## 2022-12-03 ENCOUNTER — Other Ambulatory Visit: Payer: Self-pay

## 2022-12-03 MED ORDER — EZETIMIBE 10 MG PO TABS: 10.0000 mg | ORAL_TABLET | Freq: Every day | ORAL | 0 refills | Status: DC

## 2022-12-03 MED ORDER — SIMVASTATIN 10 MG PO TABS: 10.0000 mg | ORAL_TABLET | ORAL | 0 refills | Status: DC

## 2022-12-03 MED ORDER — APIXABAN 5 MG PO TABS: 5.0000 mg | ORAL_TABLET | Freq: Two times a day (BID) | ORAL | 0 refills | Status: DC

## 2022-12-05 ENCOUNTER — Other Ambulatory Visit: Payer: Self-pay | Admitting: Adult Health

## 2022-12-18 ENCOUNTER — Telehealth: Payer: Self-pay | Admitting: Adult Health

## 2022-12-18 NOTE — Telephone Encounter (Addendum)
Daughter Byrd Hesselbach) is asking if NP has samples of Eliquis?  This is because pharmacy is charging $900 for the apixaban (ELIQUIS) 5 MG TABS tablet, being that Pt does not have Medicare Part B, after all.  Daughter is going to apply for Medicare Part B &/or patient assistance program for Pt, but in the interim she wanted to know if she could come in and pick up some samples, if there are any available.  Please advise.  Also...  Pt already used the coupon during the 1st month, so that is no longer an option.

## 2022-12-19 ENCOUNTER — Telehealth: Payer: Self-pay | Admitting: Adult Health

## 2022-12-19 NOTE — Telephone Encounter (Signed)
Spoke to pt daughter and advised that samples were placed in front office. No further actions needed.

## 2022-12-19 NOTE — Telephone Encounter (Signed)
Called patient to schedule Medicare Annual Wellness Visit (AWV). Left message for patient to call back and schedule Medicare Annual Wellness Visit (AWV).  Last date of AWV: 12/31/21  Please schedule an appointment at any time with [facility] or hannh kim .  If any questions, please contact me at [phone].  Thank you ,  Rudell Cobb AWV direct phone # [phone]

## 2022-12-19 NOTE — Telephone Encounter (Signed)
Pt returned call but was not aware of the medication. Pt stated that his daughter handles this. I advised that I tried calling daughter but no answer. Called daughter again vm full. Will try again later

## 2022-12-24 ENCOUNTER — Encounter (HOSPITAL_COMMUNITY): Payer: Self-pay

## 2022-12-24 ENCOUNTER — Ambulatory Visit (HOSPITAL_COMMUNITY)
Admission: EM | Admit: 2022-12-24 | Discharge: 2022-12-24 | Disposition: A | Discharge status: Home / Self Care | Payer: Medicare Other | Attending: Physician Assistant | Admitting: Physician Assistant

## 2022-12-24 DIAGNOSIS — D692 Other nonthrombocytopenic purpura: Secondary | ICD-10-CM | POA: Diagnosis not present

## 2022-12-24 NOTE — ED Provider Notes (Signed)
[facility]    CSN: [id number] Arrival date & time: 12/24/22  1702      History   Chief Complaint Chief Complaint  Patient presents with   Hand and Arm Redness    HPI Jason Farmer is a 88 y.o. male.   HPI   Concern for redness of right forearm   RASH Duration:  months  Location: arms  Itching: no Burning: no Redness: yes Oozing: no Scaling: no Blisters: no Painful: no Fevers: no Change in detergents/soaps/personal care products: no Recent illness: no Recent travel:no History of same: yes on the left forearm a few years ago  Context: worse Alleviating factors:  has not tried any interventions Treatments attempted:nothing Shortness of breath: no  Throat/tongue swelling: no Myalgias/arthralgias: yes- report reduced ROM of left shoulder    Past Medical History:  Diagnosis Date   Anxiety    "not a problem for a long time" (11/27/2016)   Arthritis    "wrists, knees, shoulders, back" (11/27/2016)   Chronic lower back pain    "5th disc is protruding" (11/27/2016)   Essential tremor    GERD (gastroesophageal reflux disease)    Glaucoma    Heart murmur    dx'd in [employer] in the 1960s; haven't been seen for it since "(11/27/2016)   Hepatitis ~ 1958   "jaundice kind"   HOH (hard of hearing)    Hyperlipemia    Migraine    "none since my neck OR" (11/27/2016)   Seasonal allergies    TIA (transient ischemic attack) 04/2015   Wears glasses    Wears hearing aid     Patient Active Problem List   Diagnosis Date Noted   New onset atrial fibrillation (HCC) 11/12/2022   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   Essential hypertension, benign 12/07/2014   Muscle cramps 03/14/2014   Allergic rhinitis, seasonal 04/06/2012   PERSONAL HX COLONIC POLYPS 08/06/2010   GERD 11/05/2009   RESTLESS LEG SYNDROME 10/04/2009   Hyperlipidemia 08/23/2009   ANXIETY DISORDER, GENERALIZED 08/23/2009   TREMOR, ESSENTIAL 08/23/2009   ERECTILE DYSFUNCTION, 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    Past  Surgical History:  Procedure Laterality Date   ANTERIOR CERVICAL DECOMP/DISCECTOMY FUSION  2007   APPENDECTOMY     BACK SURGERY     CARPAL TUNNEL RELEASE Right 10/12/2013   Procedure: RIGHT CARPAL TUNNEL RELEASE;  Surgeon: Nicki Reaper, MD;  Location: [facility];  Service: Orthopedics;  Laterality: Right;   CATARACT EXTRACTION W/ INTRAOCULAR LENS  IMPLANT, BILATERAL Bilateral    COLONOSCOPY     HEMORRHOID SURGERY     KNEE ARTHROSCOPY Left 1970s   NASAL SEPTOPLASTY W/ TURBINOPLASTY Bilateral 02/15/2013   Procedure: NASAL SEPTOPLASTY WITH BILATER TURBINATE REDUCTION;  Surgeon: Darletta Moll, MD;  Location: [facility];  Service: ENT;  Laterality: Bilateral;   TONSILLECTOMY     TRIGGER FINGER RELEASE  06/08/2012   Procedure: RELEASE TRIGGER FINGER/A-1 PULLEY;  Surgeon: Nicki Reaper, MD;  Location: [facility];  Service: Orthopedics;  Laterality: Left;  Release A-1 Pulley Left Long Finger   TRIGGER FINGER RELEASE  07/14/2012   Procedure: RELEASE TRIGGER FINGER/A-1 PULLEY;  Surgeon: Nicki Reaper, MD;  Location: [facility];  Service: Orthopedics;  Laterality: Right;   TRIGGER FINGER RELEASE Right 10/12/2013   Procedure: RELEASE TRIGGER FINGER/A-1 PULLEY RIGHT MIDDLE FINGER;  Surgeon: Nicki Reaper, MD;  Location: [facility];  Service: Orthopedics;  Laterality: Right;   UPPER GASTROINTESTINAL ENDOSCOPY  Home Medications    Prior to Admission medications   Medication Sig Start Date End Date Taking? Authorizing Provider  acetaminophen (TYLENOL) 325 MG tablet Take 325 mg by mouth every 6 (six) hours as needed for moderate pain or headache.   Yes [provider]  apixaban (ELIQUIS) 5 MG TABS tablet Take 1 tablet (5 mg total) by mouth 2 (two) times daily. 12/03/22 01/02/23 Yes Nafziger, Kandee Keen, NP  azelastine (ASTELIN) 0.1 % nasal spray Place 2 sprays into both nostrils 3 times/day as needed-between meals & bedtime for  allergies. Use in each nostril as directed   Yes [provider]  Cholecalciferol (VITAMIN D3) 125 MCG (5000 UT) CAPS Take 5,000 Units by mouth daily.   Yes [provider]  escitalopram (LEXAPRO) 10 MG tablet TAKE ONE TABLET BY MOUTH ONCE DAILY 08/19/22  Yes Nafziger, Kandee Keen, NP  ezetimibe (ZETIA) 10 MG tablet Take 1 tablet (10 mg total) by mouth daily. 12/03/22  Yes Nafziger, Kandee Keen, NP  latanoprost (XALATAN) 0.005 % ophthalmic solution Place 1 drop into both eyes at bedtime.    Yes [provider]  metoprolol tartrate (LOPRESSOR) 50 MG tablet Take 1 tablet (50 mg total) by mouth 2 (two) times daily. 12/05/22 01/04/23 Yes Nafziger, Kandee Keen, NP  mirtazapine (REMERON) 15 MG tablet TAKE ONE TABLET BY MOUTH AT BEDTIME 12/03/22  Yes Nafziger, Kandee Keen, NP  omeprazole (PRILOSEC) 20 MG capsule TAKE ONE CAPSULE BY MOUTH ONCE DAILY 08/19/22  Yes Nafziger, Kandee Keen, NP  simvastatin (ZOCOR) 10 MG tablet Take 1 tablet (10 mg total) by mouth every other day. 12/03/22  Yes Nafziger, Kandee Keen, NP    Family History Family History  Problem Relation Age of Onset   Dementia Father    Heart failure Father    Glaucoma Mother    Colon cancer Neg Hx     Social History Social History   Tobacco Use   Smoking status: Former    Packs/day: 2.00    Years: 30.00    Additional pack years: 0.00    Total pack years: 60.00    Types: Cigarettes    Quit date: 06/03/1984    Years since quitting: 38.5   Smokeless tobacco: Never   Tobacco comments:    had AAA at the [place]; also noted 2018 on Dr. Fabio Bering note   Vaping Use   Vaping Use: Never used  Substance Use Topics   Alcohol use: No    Comment: 11/27/2016 "quit in 1985"   Drug use: No     Allergies   Statins and Aspirin   Review of Systems Review of Systems  Skin:  Positive for color change.       Darkened red and purple blotches on skin       Physical Exam Triage Vital Signs ED Triage Vitals  Enc Vitals Group     BP 12/24/22 1734 139/77      Pulse Rate 12/24/22 1734 62     Resp 12/24/22 1734 16     Temp 12/24/22 1734 98.6 F (37 C)     Temp Source 12/24/22 1734 Oral     SpO2 12/24/22 1734 96 %     Weight 12/24/22 1734 132 lb (59.9 kg)     Height 12/24/22 1734 5\' 8"  (1.727 m)     Head Circumference --      Peak Flow --      Pain Score 12/24/22 1733 0     Pain Loc --      Pain Edu? --  Excl. in GC? --    No data found.  Updated Vital Signs BP 139/77 (BP Location: Right Arm)   Pulse 62   Temp 98.6 F (37 C) (Oral)   Resp 16   Ht 5\' 8"  (1.727 m)   Wt 132 lb (59.9 kg)   SpO2 96%   BMI 20.07 kg/m   Visual Acuity Right Eye Distance:   Left Eye Distance:   Bilateral Distance:    Right Eye Near:   Left Eye Near:    Bilateral Near:     Physical Exam Vitals reviewed.  Constitutional:      General: He is awake.     Appearance: Normal appearance. He is well-developed and well-groomed.  HENT:     Head: Normocephalic and atraumatic.  Pulmonary:     Effort: Pulmonary effort is normal.  Musculoskeletal:     Cervical back: Normal range of motion.  Skin:    General: Skin is warm and dry.     Findings: Bruising, ecchymosis and erythema present.  Neurological:     Mental Status: He is alert.  Psychiatric:        Behavior: Behavior is cooperative.      Media Information   Document Information  Photos    12/24/2022 17:59  Attached To:  Hospital Encounter on 12/24/22  Source Information  Nardos Putnam, Mirian Mo  [facility]     Media Information   Document Information  Photos    12/24/2022 17:59  Attached To:  Hospital Encounter on 12/24/22  Source Information  Treasure Ochs, Oswaldo Conroy, PA-C  [facility]    UC Treatments / Results  Labs (all labs ordered are listed, but only abnormal results are displayed) Labs Reviewed - No data to display  EKG   Radiology No results found.  Procedures Procedures (including critical care time)  Medications Ordered in UC Medications -  No data to display  Initial Impression / Assessment and Plan / UC Course  I have reviewed the triage vital signs and the nursing notes.  Pertinent labs & imaging results that were available during my care of the patient were reviewed by me and considered in my medical decision making (see chart for details).    Problem List Items Addressed This Visit   None Visit Diagnoses     Senile purpura (HCC)    -  Primary Chronic, ongoing concern Patient reports that he has had ongoing redness and apparent bruising along his right forearm for the past few months that is not resolving Suspect senile purpura at this time but given that he is not on blood thinners, NSAIDs, aspirin cannot rule out other causes Will place dermatology referral for evaluation and potential management Recommend conservative management with keeping area clean dry and well moisturized with Eucerin or Aquaphor topical per preference Recommend follow-up with PCP for persistent or worsening symptoms         Final Clinical Impressions(s) / UC Diagnoses   Final diagnoses:  Senile purpura (HCC)     Discharge Instructions      While you are waiting for the Dermatology apt I recommend the following: Keeping the area covered and out of the sun  Keep the area well moisturized with Eucerin or Aquaphor per your preference Avoid poking or picking at the area. See your PCP for follow up and monitoring especially if it gets worse     ED Prescriptions   None    PDMP not reviewed this encounter.   Ayala Ribble,  Oswaldo Conroy, PA-C 12/24/22 1808

## 2022-12-24 NOTE — Discharge Instructions (Signed)
While you are waiting for the Dermatology apt I recommend the following: Keeping the area covered and out of the sun  Keep the area well moisturized with Eucerin or Aquaphor per your preference Avoid poking or picking at the area. See your PCP for follow up and monitoring especially if it gets worse

## 2022-12-24 NOTE — ED Triage Notes (Signed)
Patient here today with c/o of redness in his right forearm and hand X 3 months. Patient states that the redness started mainly in his hand but has spread and keeps getting worse. About a year or 2 ago he had increased redness in his left arm in the same area which has improved some. His skin has also been thinning in these areas.

## 2023-01-08 ENCOUNTER — Emergency Department (HOSPITAL_COMMUNITY): Payer: Medicare Other

## 2023-01-08 ENCOUNTER — Emergency Department (HOSPITAL_COMMUNITY)
Admission: EM | Admit: 2023-01-08 | Discharge: 2023-01-08 | Discharge status: Against Medical Advice | Payer: Medicare Other | Attending: Emergency Medicine | Admitting: Emergency Medicine

## 2023-01-08 ENCOUNTER — Other Ambulatory Visit: Payer: Self-pay

## 2023-01-08 ENCOUNTER — Encounter (HOSPITAL_COMMUNITY): Payer: Self-pay

## 2023-01-08 DIAGNOSIS — Z5321 Procedure and treatment not carried out due to patient leaving prior to being seen by health care provider: Secondary | ICD-10-CM | POA: Insufficient documentation

## 2023-01-08 DIAGNOSIS — R079 Chest pain, unspecified: Secondary | ICD-10-CM | POA: Diagnosis not present

## 2023-01-08 DIAGNOSIS — R0789 Other chest pain: Secondary | ICD-10-CM | POA: Diagnosis not present

## 2023-01-08 DIAGNOSIS — R0602 Shortness of breath: Secondary | ICD-10-CM | POA: Insufficient documentation

## 2023-01-08 DIAGNOSIS — Z7901 Long term (current) use of anticoagulants: Secondary | ICD-10-CM | POA: Diagnosis not present

## 2023-01-08 LAB — CBC WITH DIFFERENTIAL/PLATELET
Abs Immature Granulocytes: 0.03 10*3/uL (ref 0.00–0.07)
Basophils Absolute: 0 10*3/uL (ref 0.0–0.1)
Basophils Relative: 1 %
Eosinophils Absolute: 0.1 10*3/uL (ref 0.0–0.5)
Eosinophils Relative: 1 %
HCT: 44.7 % (ref 39.0–52.0)
Hemoglobin: 14.4 g/dL (ref 13.0–17.0)
Immature Granulocytes: 0 %
Lymphocytes Relative: 32 %
Lymphs Abs: 2.4 10*3/uL (ref 0.7–4.0)
MCH: 30.1 pg (ref 26.0–34.0)
MCHC: 32.2 g/dL (ref 30.0–36.0)
MCV: 93.3 fL (ref 80.0–100.0)
Monocytes Absolute: 0.7 10*3/uL (ref 0.1–1.0)
Monocytes Relative: 9 %
Neutro Abs: 4.4 10*3/uL (ref 1.7–7.7)
Neutrophils Relative %: 57 %
Platelets: 266 10*3/uL (ref 150–400)
RBC: 4.79 MIL/uL (ref 4.22–5.81)
RDW: 12.8 % (ref 11.5–15.5)
WBC: 7.6 10*3/uL (ref 4.0–10.5)
nRBC: 0 % (ref 0.0–0.2)

## 2023-01-08 LAB — TROPONIN I (HIGH SENSITIVITY): Troponin I (High Sensitivity): 10 ng/L (ref <18)

## 2023-01-08 LAB — BASIC METABOLIC PANEL
Anion gap: 9 (ref 5–15)
BUN: 20 mg/dL (ref 8–23)
CO2: 25 mmol/L (ref 22–32)
Calcium: 9.3 mg/dL (ref 8.9–10.3)
Chloride: 103 mmol/L (ref 98–111)
Creatinine, Ser: 1.29 mg/dL — ABNORMAL HIGH (ref 0.61–1.24)
GFR, Estimated: 54 mL/min — ABNORMAL LOW (ref >60)
Glucose, Bld: 105 mg/dL — ABNORMAL HIGH (ref 70–99)
Potassium: 4.2 mmol/L (ref 3.5–5.1)
Sodium: 137 mmol/L (ref 135–145)

## 2023-01-08 NOTE — ED Notes (Signed)
Pt stated he was leaving. 

## 2023-01-08 NOTE — ED Provider Triage Note (Signed)
Emergency Medicine Provider Triage Evaluation Note  Jason Farmer , a 88 y.o. male  was evaluated in triage.  Pt complains of chest pain.  Patient states his wife was just admitted from the emergency department.  Reports episode prior to coming to triage area of right-sided chest pain with radiation to left as well as feelings of heart racing.  States the episode lasted for approximately a minute.  States that he was walking around the emergency department when experiencing symptoms and became short of breath.  States had a prior episode and was found with atrial fibrillation with RVR.  Patient states he is currently on Eliquis for history of paroxysmal atrial fibrillation.  Denies any current chest pain/shortness of breath.  Denies fever, abdominal pain, nausea, vomiting, urinary symptoms, change in bowel habits..  Review of Systems  Positive: See abov Negative:   Physical Exam  BP (!) 135/101 (BP Location: Right Arm)   Pulse (!) 58   Temp 98 F (36.7 C)   Resp 16   SpO2 100%  Gen:   Awake, no distress   Resp:  Normal effort  MSK:   Moves extremities without difficulty  Other:    Medical Decision Making  Medically screening exam initiated at 1:00 PM.  Appropriate orders placed.  Jason Farmer was informed that the remainder of the evaluation will be completed by another provider, this initial triage assessment does not replace that evaluation, and the importance of remaining in the ED until their evaluation is complete.     Peter Garter, [place] 01/08/23 1301

## 2023-01-08 NOTE — ED Triage Notes (Signed)
Pt came in via POV d/t CP on his Rt chest that radiated across to the other side of his chest & began feeling lightheaded while he was here visiting another pt. Hx of A-Fib & is on Eliquis, A/Ox4, denies SOB now but did have it when this took place.

## 2023-01-13 ENCOUNTER — Telehealth: Payer: Self-pay

## 2023-01-13 ENCOUNTER — Encounter: Payer: Self-pay | Admitting: Physician Assistant

## 2023-01-13 ENCOUNTER — Ambulatory Visit (INDEPENDENT_AMBULATORY_CARE_PROVIDER_SITE_OTHER): Payer: Medicare Other | Admitting: Physician Assistant

## 2023-01-13 DIAGNOSIS — Z8719 Personal history of other diseases of the digestive system: Secondary | ICD-10-CM | POA: Diagnosis not present

## 2023-01-13 DIAGNOSIS — R634 Abnormal weight loss: Secondary | ICD-10-CM | POA: Diagnosis not present

## 2023-01-13 DIAGNOSIS — K219 Gastro-esophageal reflux disease without esophagitis: Secondary | ICD-10-CM

## 2023-01-13 DIAGNOSIS — R1319 Other dysphagia: Secondary | ICD-10-CM | POA: Diagnosis not present

## 2023-01-13 NOTE — Telephone Encounter (Signed)
Transition Care Management Unsuccessful Follow-up Telephone Call  Date of discharge and from where:  [name] 5/16  Attempts:  1st Attempt  Reason for unsuccessful TCM follow-up call:  Left voice message   Kandice Schmelter [facility] Guide, Kevin [phone] [address], [place], [place] [address] Phone: [phone] Email: [email or web address]       

## 2023-01-13 NOTE — Progress Notes (Signed)
Subjective:    Patient ID: Jason Farmer, male    DOB: 13-Jun-1935, 88 y.o.   MRN: [id number]  HPI [name] is a pleasant 88 year old white male, established with Dr. Russella Dar.  He comes back in today with persistent complaints of dysphagia and has lost about 8 pounds over the past 3 months due to significant difficulty with dysphagia. He has history of esophageal stricture with EGD in January 2022 with finding of a benign distal esophageal stricture which was Savary dilated to 17 mm, also noted to have some mild patchy gastritis and multiple gastric polyps.  Path showed no H. pylori and fundic gland polyps. He was seen by Dr. Russella Dar in the office on 11/06/2022 with recurrent dysphagia and scheduled for EGD on 11/12/2022.  Prior to sedation however he was noted to have a heart rate in the 140s, and had complained of some vague chest pressure.  Ultimately the procedure was canceled and he was sent to the hospital for further evaluation.  He was admitted with A-fib with RVR/a flutter, underwent cardiac consultation with Dr. Eden Emms He converted with medications, and was started on metoprolol, and Eliquis.  There was discussion of TEE with [facility] but that was not required.  Patient says he has been doing okay since discharge from the hospital, has never had any symptoms and says he did not have any symptoms the day that he was here for the endoscopy. He is unfortunately having ongoing problems with dysphagia, does not have any difficulty with liquids or pills but is now completely avoiding meats and some heavier foods.  He has lost about 8 pounds.  He had 1 episode recently while he was at a restaurant that required him to get up and regurgitate due to food lodging. He is having symptoms pretty much on a daily basis at this point, says yesterday he did not eat much because he did not feel that things were going down well.  He very much wants to proceed with EGD and dilation. He is on omeprazole 20 mg p.o. daily  chronically.    Review of Systems Pertinent positive and negative review of systems were noted in the above HPI section.  All other review of systems was otherwise negative.   Outpatient Encounter Medications as of 01/13/2023  Medication Sig   acetaminophen (TYLENOL) 325 MG tablet Take 325 mg by mouth every 6 (six) hours as needed for moderate pain or headache.   azelastine (ASTELIN) 0.1 % nasal spray Place 2 sprays into both nostrils 3 times/day as needed-between meals & bedtime for allergies. Use in each nostril as directed   Cholecalciferol (VITAMIN D3) 125 MCG (5000 UT) CAPS Take 5,000 Units by mouth daily.   escitalopram (LEXAPRO) 10 MG tablet TAKE ONE TABLET BY MOUTH ONCE DAILY   ezetimibe (ZETIA) 10 MG tablet Take 1 tablet (10 mg total) by mouth daily.   latanoprost (XALATAN) 0.005 % ophthalmic solution Place 1 drop into both eyes at bedtime.    mirtazapine (REMERON) 15 MG tablet TAKE ONE TABLET BY MOUTH AT BEDTIME   omeprazole (PRILOSEC) 20 MG capsule TAKE ONE CAPSULE BY MOUTH ONCE DAILY   simvastatin (ZOCOR) 10 MG tablet Take 1 tablet (10 mg total) by mouth every other day.   apixaban (ELIQUIS) 5 MG TABS tablet Take 1 tablet (5 mg total) by mouth 2 (two) times daily.   metoprolol tartrate (LOPRESSOR) 50 MG tablet Take 1 tablet (50 mg total) by mouth 2 (two) times daily.   No facility-administered  encounter medications on file as of 01/13/2023.   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 Fabio Bering note   Vaping Use   Vaping Use: Never used  Substance and Sexual Activity   Alcohol use: No    Comment: 11/27/2016 "quit in 1985"   Drug use: No   Sexual activity: Not Currently  Other Topics Concern   Not on file  Social History Narrative   Retired from [employer]   Married    4 children, one son lives locally   Social Determinants of Health   Financial Resource Strain: Low Risk  (12/31/2021)   Overall Financial Resource Strain (CARDIA)    Difficulty of Paying Living Expenses: Not hard at all  Food Insecurity: No Food Insecurity (12/31/2021)   Hunger Vital Sign    Worried About Running Out of Food in the Last Year: Never true    Ran Out of Food in the Last Year: Never true  Transportation Needs: No Transportation Needs (06/09/2022)   PRAPARE - Administrator, Civil Service (Medical): No    Lack of Transportation (Non-Medical): No  Physical Activity: Inactive (12/31/2021)   Exercise Vital Sign    Days of Exercise per Week: 0 days    Minutes of Exercise per Session: 0 min  Stress: No Stress Concern Present (12/31/2021)   [employer] of Occupational Health - Occupational Stress Questionnaire    Feeling of Stress : Not at all  Recent Concern: Stress - Stress Concern Present (11/28/2021)   [employer] of Occupational Health - Occupational  Stress Questionnaire    Feeling of Stress : Rather much  Social Connections: Moderately Integrated (11/28/2021)   Social Connection and Isolation Panel [NHANES]    Frequency of Communication with Friends and Family: Three times a week    Frequency of Social Gatherings with Friends and Family: Once a week    Attends Religious Services: Never    Database administrator or Organizations: Yes    Attends Engineer, structural: More than 4 times per year    Marital Status: Married  Catering manager Violence: Not At Risk (12/25/2020)   Humiliation, Afraid, Rape, and Kick questionnaire    Fear  of Current or Ex-Partner: No    Emotionally Abused: No    Physically Abused: No    Sexually Abused: No    Mr. Sattler family history includes Dementia in his father; Glaucoma in his mother; Heart failure in his father.      Objective:    Vitals:   01/13/23 1427  BP: 136/82  Pulse: 90    Physical Exam Well-developed well-nourished thin elderly white male in no acute distress.  Patient comes in alone today, pleasant  Weight, 144 BMI 21.9  HEENT; nontraumatic normocephalic, EOMI, PE R LA, sclera anicteric. Oropharynx; not examined today Neck; supple, no JVD Cardiovascular; regular rate and rhythm with S1-S2, no murmur rub or gallop-rate 86 Pulmonary; Clear bilaterally Abdomen; soft, nontender, nondistended, no palpable mass or hepatosplenomegaly, bowel sounds are active Rectal; not done today Skin; benign exam, no jaundice rash or appreciable lesions Extremities; no clubbing cyanosis or edema skin warm and dry Neuro/Psych; alert and oriented x4, grossly nonfocal mood and affect appropriate        Assessment & Plan:   #65 88 year old white male with history of chronic GERD and distal esophageal stricture requiring prior dilation in January 2022.  He had presented with recurrent dysphagia in March 2024 and was scheduled for EGD with dilation on 11/12/2022.  However procedure was canceled  because on arrival he was noted to have a heart rate in the 140s, ultimately determined to be A-fib/flutter. He was sent to the hospital and had admission, cardiac evaluation.  He did not require cardioversion, converted with medications. He has been asymptomatic from a cardiac standpoint.  #2 progressive symptoms of dysphagia particularly to solids meats and frequent episodes requiring regurgitation.  His weight is also down about 8 pounds over the past 3 months.  #3 restless leg syndrome #4.  Anxiety # 5.  History of colon polyps. # 6 new anticoagulation for A-fib/on Eliquis  Plan; Patient will be scheduled for EGD with esophageal dilation with Dr. Russella Dar in the [facility].  Procedure was discussed in detail with the patient including indications risk and benefits and he is agreeable to proceed. Eliquis will need to be held for 48 hours prior to procedure.  We will communicate with his cardiologist Dr. Eden Emms  to assure this is reasonable for this patient. For now continue omeprazole 20 mg p.o. every morning, may need to consider increasing dose based on EGD findings. Patient cautioned to cut his food into very tiny pieces, eat slowly and sip fluids between bites in the interim until his procedure can be done.   Genieve Ramaswamy Oswald Hillock PA-C 01/13/2023   Cc: [name], NP

## 2023-01-13 NOTE — Patient Instructions (Signed)
_______________________________________________________  If your blood pressure at your visit was 140/90 or greater, please contact your primary care physician to follow up on this. _______________________________________________________  If you are age 86 or older, your body mass index should be between 23-30. Your Body mass index is 21.96 kg/m. If this is out of the aforementioned range listed, please consider follow up with your Primary Care Provider. ________________________________________________________  The [facility] providers would like to encourage you to use [facility] to communicate with providers for non-urgent requests or questions.  Due to long hold times on the telephone, sending your provider a message by [facility] may be a faster and more efficient way to get a response.  Please allow 48 business hours for a response.  Please remember that this is for non-urgent requests.  _______________________________________________________  CONTINUE: Omeprazole 20mg  one capsule daily.  You have been scheduled for an endoscopy. Please follow written instructions given to you at your visit today. If you use inhalers (even only as needed), please bring them with you on the day of your procedure.  Due to recent changes in healthcare laws, you may see the results of your imaging and laboratory studies on MyChart before your provider has had a chance to review them.  We understand that in some cases there may be results that are confusing or concerning to you. Not all laboratory results come back in the same time frame and the provider may be waiting for multiple results in order to interpret others.  Please give [name] 48 hours in order for your provider to thoroughly review all the results before contacting the office for clarification of your results.   Thank you for entrusting me with your care and choosing [facility].  Amy Esterwood, PA-C

## 2023-01-13 NOTE — Telephone Encounter (Signed)
[facility] Pre-operative Risk Assessment     Request for surgical clearance:     Endoscopy Procedure  What type of surgery is being performed?     Endoscopy with Dil  When is this surgery scheduled?     01-20-23  What type of clearance is required ?   Pharmacy  Are there any medications that need to be held prior to surgery and how long? Eliquis x 2   Practice name and name of physician performing surgery?      [facility]  What is your office phone and fax number?      Phone- [phone]  Fax- [phone]  Anesthesia type (None, local, MAC, general) ?       MAC

## 2023-01-15 NOTE — Telephone Encounter (Signed)
I left Jason Farmer a detailed message about holding his Eliquis and then I was able to reach his daughter Jason Farmer and I went over it with her as well. She verbalized understanding to hold it 2 days.

## 2023-01-15 NOTE — Telephone Encounter (Signed)
   Name:  Jason Farmer  DOB:  [birth year]  MRN:  [id number]   Primary Cardiologist: None  Chart reviewed as part of pre-operative protocol coverage.  Please see recommendations below for holding Eliquis for patient.   Eliquis being prescribed by PCP, pt has not been seen by [facility] in 6 years. Clearance should come from managing provider as procedure is next week.       - Please contact requesting surgeon's office via preferred method (i.e, phone, fax) to inform them of need for appointment prior to surgery.  Napoleon Form, Leodis Rains, NP 01/15/2023, 7:48 AM

## 2023-01-15 NOTE — Telephone Encounter (Addendum)
Will route to Pharell's PCP Dr Evelene Croon ASAP. I called his office and he is in the office today so hopefully we will get an answer quickly.

## 2023-01-15 NOTE — Telephone Encounter (Signed)
Eliquis being prescribed by PCP, pt has not been seen by [facility] in 6 years. Clearance should come from managing provider as procedure is next week.

## 2023-01-16 ENCOUNTER — Telehealth: Payer: Self-pay

## 2023-01-16 NOTE — Telephone Encounter (Signed)
Transition Care Management Unsuccessful Follow-up Telephone Call  Date of discharge and from where:  Jason Farmer 5/16  Attempts:  2nd Attempt  Reason for unsuccessful TCM follow-up call:  Left voice message   Jason Farmer [facility] Guide, [facility] [phone] [address] [place], [place], [place] [address] Phone: [phone] Email: [email or web address]

## 2023-01-20 ENCOUNTER — Encounter: Payer: Self-pay | Admitting: Gastroenterology

## 2023-01-20 ENCOUNTER — Ambulatory Visit (AMBULATORY_SURGERY_CENTER): Payer: Medicare Other | Admitting: Gastroenterology

## 2023-01-20 VITALS — BP 108/74 | HR 66 | Temp 98.6°F | Resp 11 | Ht 68.0 in | Wt 144.0 lb

## 2023-01-20 DIAGNOSIS — R1310 Dysphagia, unspecified: Secondary | ICD-10-CM

## 2023-01-20 DIAGNOSIS — K222 Esophageal obstruction: Secondary | ICD-10-CM | POA: Diagnosis not present

## 2023-01-20 DIAGNOSIS — K219 Gastro-esophageal reflux disease without esophagitis: Secondary | ICD-10-CM | POA: Diagnosis not present

## 2023-01-20 MED ORDER — SODIUM CHLORIDE 0.9 % IV SOLN
500.0000 mL | INTRAVENOUS | Status: DC
Start: 2023-01-20 — End: 2023-03-06

## 2023-01-20 NOTE — Progress Notes (Signed)
Patient states there have been no changes to medical or surgical history since time of pre-visit. 

## 2023-01-20 NOTE — Progress Notes (Signed)
See 5/21//2024 H&P, no changes

## 2023-01-20 NOTE — Progress Notes (Signed)
To pacu, VSS. Report to [email or web address].tb 

## 2023-01-20 NOTE — Patient Instructions (Addendum)
YOU HAD AN ENDOSCOPIC PROCEDURE TODAY AT THE [facility]:   Refer to the procedure report that was given to you for any specific questions about what was found during the examination.  If the procedure report does not answer your questions, please call your gastroenterologist to clarify.  If you requested that your care partner not be given the details of your procedure findings, then the procedure report has been included in a sealed envelope for you to review at your convenience later.  YOU SHOULD EXPECT: Some feelings of bloating in the abdomen. Passage of more gas than usual.  Walking can help get rid of the air that was put into your GI tract during the procedure and reduce the bloating. If you had a lower endoscopy (such as a colonoscopy or flexible sigmoidoscopy) you may notice spotting of blood in your stool or on the toilet paper. If you underwent a bowel prep for your procedure, you may not have a normal bowel movement for a few days.  Please Note:  You might notice some irritation and congestion in your nose or some drainage.  This is from the oxygen used during your procedure.  There is no need for concern and it should clear up in a day or so.  SYMPTOMS TO REPORT IMMEDIATELY:   Following upper endoscopy (EGD)  Vomiting of blood or coffee ground material  New chest pain or pain under the shoulder blades  Painful or persistently difficult swallowing  New shortness of breath  Fever of 100F or higher  Black, tarry-looking stools  For urgent or emergent issues, a gastroenterologist can be reached at any hour by calling [phone]. Do not use MyChart messaging for urgent concerns.    DIET:  Follow a Post-Dilation Diet (see handout given to you by your recovery nurse): NOTHING TO EAT OR DRINK until 12:00 pm, then CLEAR LIQUIDS ONLY for 2 hours from 12:00 pm to 2:00 pm, then you may have a soft diet for the rest of the day today starting at 2:00 pm. You may proceed to your  regular diet as tolerated tomorrow morning. Drink plenty of fluids but you should avoid alcoholic beverages for 24 hours.  MEDICATIONS: Continue present medications. RESUME ELIQUIS (APIXABAN) AT PRIOR DOSE TOMORROW.  Thank you for allowing [name] to provide for your healthcare needs today.  Please see handouts given to you by your recovery nurse: GERD/Hiatal Hernia, esophageal stricture, post-dilation diet.  ACTIVITY:  You should plan to take it easy for the rest of today and you should NOT DRIVE or use heavy machinery until tomorrow (because of the sedation medicines used during the test).    FOLLOW UP: Our staff will call the number listed on your records the next business day following your procedure.  We will call around 7:15- 8:00 am to check on you and address any questions or concerns that you may have regarding the information given to you following your procedure. If we do not reach you, we will leave a message.     If any biopsies were taken you will be contacted by phone or by letter within the next 1-3 weeks.  Please call us at [phone] if you have not heard about the biopsies in 3 weeks.    SIGNATURES/CONFIDENTIALITY: You and/or your care partner have signed paperwork which will be entered into your electronic medical record.  These signatures attest to the fact that that the information above on your After Visit Summary has been reviewed and  is understood.  Full responsibility of the confidentiality of this discharge information lies with you and/or your care-partner.

## 2023-01-20 NOTE — Op Note (Signed)
[facility] Patient Name: Jason Farmer Procedure Date: 01/20/2023 10:18 AM MRN: [id number] Endoscopist: Meryl Dare , MD, [phone] Age: 88 Referring MD:  Date of Birth: 1935/03/09 Gender: Male Account #: [id number] Procedure:                Upper GI endoscopy Indications:              Dysphagia, Gastroesophageal reflux disease Medicines:                Monitored Anesthesia Care Procedure:                Pre-Anesthesia Assessment:                           - Prior to the procedure, a History and Physical                            was performed, and patient medications and                            allergies were reviewed. The patient's tolerance of                            previous anesthesia was also reviewed. The risks                            and benefits of the procedure and the sedation                            options and risks were discussed with the patient.                            All questions were answered, and informed consent                            was obtained. Prior Anticoagulants: The patient has                            taken Eliquis (apixaban), last dose was 2 days                            prior to procedure. ASA Grade Assessment: III - A                            patient with severe systemic disease. After                            reviewing the risks and benefits, the patient was                            deemed in satisfactory condition to undergo the                            procedure.  After obtaining informed consent, the endoscope was                            passed under direct vision. Throughout the                            procedure, the patient's blood pressure, pulse, and                            oxygen saturations were monitored continuously. The                            GIF [id number] #[id number] was introduced through the                            mouth, and advanced to the second part  of duodenum.                            The upper GI endoscopy was accomplished without                            difficulty. The patient tolerated the procedure                            well. Scope In: Scope Out: Findings:                 One benign-appearing, intrinsic mild stenosis was                            found at the gastroesophageal junction. This                            stenosis measured 1.4 cm (inner diameter) x less                            than one cm (in length). The stenosis was                            traversed. A guidewire was placed and the scope was                            withdrawn. Dilation was performed with a Savary                            dilator with no resistance at 17 mm.                           The exam of the esophagus was otherwise normal.                           A medium-sized hiatal hernia was present.                           Multiple  small sessile polyps with no bleeding and                            no stigmata of recent bleeding were found in the                            gastric fundus and in the gastric body. Previously                            biospied.                           The exam of the stomach was otherwise normal.                           The duodenal bulb and second portion of the                            duodenum were normal. Complications:            No immediate complications. Estimated Blood Loss:     Estimated blood loss: none. Impression:               - Benign-appearing esophageal stenosis. Dilated.                           - Medium-sized hiatal hernia.                           - Multiple gastric polyps.                           - Normal duodenal bulb and second portion of the                            duodenum.                           - No specimens collected. Recommendation:           - Patient has a contact number available for                            emergencies. The signs and symptoms  of potential                            delayed complications were discussed with the                            patient. Return to normal activities tomorrow.                            Written discharge instructions were provided to the                            patient.                           -  Clear liquid diet for 2 hours, then advance as                            tolerated to soft diet today.                           - Resume prior diet tomorrow.                           - Follow antireflux measures.                           - Continue present medications.                           - Dysphagia due esophageal stricture and esophageal                            dysmotility.                           - Resume Eliquis (apixaban) at prior dose tomorrow.                            Refer to managing physician for further adjustment                            of therapy. Meryl Dare, MD 01/20/2023 10:47:39 AM This report has been signed electronically.

## 2023-01-21 ENCOUNTER — Telehealth: Payer: Self-pay | Admitting: *Deleted

## 2023-01-21 NOTE — Telephone Encounter (Signed)
No answer on  follow up call. Left message.   

## 2023-01-27 ENCOUNTER — Other Ambulatory Visit: Payer: Self-pay | Admitting: Adult Health

## 2023-01-27 ENCOUNTER — Telehealth: Payer: Self-pay | Admitting: Adult Health

## 2023-01-27 DIAGNOSIS — F419 Anxiety disorder, unspecified: Secondary | ICD-10-CM

## 2023-01-27 DIAGNOSIS — R634 Abnormal weight loss: Secondary | ICD-10-CM

## 2023-01-27 DIAGNOSIS — I10 Essential (primary) hypertension: Secondary | ICD-10-CM

## 2023-01-27 DIAGNOSIS — K219 Gastro-esophageal reflux disease without esophagitis: Secondary | ICD-10-CM

## 2023-01-27 MED ORDER — RIVAROXABAN 20 MG PO TABS: 20.0000 mg | ORAL_TABLET | Freq: Every day | ORAL | 2 refills | Status: DC

## 2023-01-27 NOTE — Telephone Encounter (Signed)
Daughter - Byrd Hesselbach - called to ask if NP knows of a more affordable alternative to Eliquis?  Pt does not have a monthly Rx plan, and they are currently paying about $500-$600/monthly.  Please call Byrd Hesselbach back at [phone]

## 2023-01-27 NOTE — Telephone Encounter (Signed)
Please advise 

## 2023-01-28 NOTE — Telephone Encounter (Signed)
  Will file in chart as: atenolol (TENORMIN) 25 MG tablet    The original prescription was discontinued on 11/13/2022 by Dorcas Carrow, MD for the following reason: Stop Taking at Discharge. Renewing this prescription may not be appropriate.   Sig: Take 1/2 tablet (12.5 mg total) by mouth daily.   Disp: 90 tablet    Refills: 0

## 2023-01-28 NOTE — Telephone Encounter (Signed)
Spoke to pt daughter and advise of message below. Pt daughter Byrd Hesselbach stated that pt is now verbally aggressive and denying help. She thinks this may be the cause of him denying Cardio. Per Byrd Hesselbach pt is getting hard to manage and wants to help pt but states he is " at a fragile state since he has been aggressive and defensive".  I did inform Byrd Hesselbach of a Rexulti and advised that I could send to [facility] for advise to see if pt qualifies for this Rx. Byrd Hesselbach stated she does not want pt to think that she had anything to do with this discussion. Byrd Hesselbach stated that pt forged her name to become his POA so she does not want to upset him.

## 2023-01-29 NOTE — Telephone Encounter (Signed)
Noted  

## 2023-01-29 NOTE — Telephone Encounter (Signed)
Patient daughter Byrd Hesselbach notified of update  and verbalized understanding.

## 2023-01-29 NOTE — Telephone Encounter (Signed)
Left message for Jason Farmer to return phone call.

## 2023-02-11 DIAGNOSIS — M25811 Other specified joint disorders, right shoulder: Secondary | ICD-10-CM | POA: Diagnosis not present

## 2023-02-11 DIAGNOSIS — M25812 Other specified joint disorders, left shoulder: Secondary | ICD-10-CM | POA: Diagnosis not present

## 2023-02-12 ENCOUNTER — Telehealth: Payer: Self-pay | Admitting: Gastroenterology

## 2023-02-12 NOTE — Telephone Encounter (Signed)
Left message on machine to call back  

## 2023-02-12 NOTE — Telephone Encounter (Signed)
Inbound call from patient requesting to speak about 5/28 endoscopy. States he was told the procedure could not be preformed fully. He would like to discuss why because he is still having trouble swallowing. Please advise, thank you.

## 2023-02-13 NOTE — Telephone Encounter (Signed)
Inbound call from patient returning phone call. Please advise, thank you.  

## 2023-02-13 NOTE — Telephone Encounter (Signed)
Left message on machine to call back  

## 2023-02-13 NOTE — Telephone Encounter (Signed)
The pt had EGD in May for esophageal stricture and states that he did well for a month but has now begun to have trouble with swallowing again. He is taking his PPI as ordered.  He avoids meats, eats soft foods and shrimp.  Do you want him to have another EGD or office visit.

## 2023-02-13 NOTE — Telephone Encounter (Signed)
The pt has been scheduled for EGD dil on 03/12/23 and previsit 03/06/23.   The pt has been advised of the appts.  No questions at this time.

## 2023-02-13 NOTE — Telephone Encounter (Signed)
Yes please schedule EGD with dilation in [facility]

## 2023-02-20 ENCOUNTER — Telehealth: Payer: Self-pay

## 2023-02-20 DIAGNOSIS — F4001 Agoraphobia with panic disorder: Secondary | ICD-10-CM | POA: Insufficient documentation

## 2023-02-20 DIAGNOSIS — G5601 Carpal tunnel syndrome, right upper limb: Secondary | ICD-10-CM | POA: Insufficient documentation

## 2023-02-20 DIAGNOSIS — I10 Essential (primary) hypertension: Secondary | ICD-10-CM | POA: Insufficient documentation

## 2023-02-20 DIAGNOSIS — R252 Cramp and spasm: Secondary | ICD-10-CM | POA: Insufficient documentation

## 2023-02-20 DIAGNOSIS — R319 Hematuria, unspecified: Secondary | ICD-10-CM | POA: Insufficient documentation

## 2023-02-20 DIAGNOSIS — H9193 Unspecified hearing loss, bilateral: Secondary | ICD-10-CM | POA: Insufficient documentation

## 2023-02-20 DIAGNOSIS — B079 Viral wart, unspecified: Secondary | ICD-10-CM | POA: Insufficient documentation

## 2023-02-20 DIAGNOSIS — H409 Unspecified glaucoma: Secondary | ICD-10-CM | POA: Insufficient documentation

## 2023-02-20 DIAGNOSIS — M791 Myalgia, unspecified site: Secondary | ICD-10-CM | POA: Insufficient documentation

## 2023-02-20 DIAGNOSIS — M19131 Post-traumatic osteoarthritis, right wrist: Secondary | ICD-10-CM | POA: Insufficient documentation

## 2023-02-20 DIAGNOSIS — D126 Benign neoplasm of colon, unspecified: Secondary | ICD-10-CM | POA: Insufficient documentation

## 2023-02-20 DIAGNOSIS — H269 Unspecified cataract: Secondary | ICD-10-CM | POA: Insufficient documentation

## 2023-02-20 DIAGNOSIS — M6530 Trigger finger, unspecified finger: Secondary | ICD-10-CM | POA: Insufficient documentation

## 2023-02-20 DIAGNOSIS — G459 Transient cerebral ischemic attack, unspecified: Secondary | ICD-10-CM | POA: Insufficient documentation

## 2023-02-20 DIAGNOSIS — M542 Cervicalgia: Secondary | ICD-10-CM | POA: Insufficient documentation

## 2023-02-20 NOTE — Telephone Encounter (Signed)
TE made to clarify patients blood thinner so the correct clearance is obtained for his upper endoscopy. VM left to call the office back.

## 2023-03-02 ENCOUNTER — Encounter: Payer: Medicare Other | Admitting: Gastroenterology

## 2023-03-04 ENCOUNTER — Other Ambulatory Visit: Payer: Self-pay | Admitting: Adult Health

## 2023-03-04 DIAGNOSIS — K219 Gastro-esophageal reflux disease without esophagitis: Secondary | ICD-10-CM

## 2023-03-05 ENCOUNTER — Telehealth: Payer: Medicare Other | Admitting: Family Medicine

## 2023-03-05 NOTE — Progress Notes (Signed)
I mixed up my schedule and called this patient at the incorrect appt time and apologized profusely. He was very understanding and is in the middle of moving and would prefer to do the appointment after the 21st. Sent schedulers a message to assist in rescheduling.

## 2023-03-06 ENCOUNTER — Ambulatory Visit (AMBULATORY_SURGERY_CENTER): Payer: Medicare Other

## 2023-03-06 VITALS — Ht 68.0 in | Wt 145.0 lb

## 2023-03-06 DIAGNOSIS — K219 Gastro-esophageal reflux disease without esophagitis: Secondary | ICD-10-CM

## 2023-03-06 DIAGNOSIS — R1310 Dysphagia, unspecified: Secondary | ICD-10-CM

## 2023-03-06 NOTE — Progress Notes (Signed)
Pre visit completed via phone call; Patient verified name, DOB, and address;  No egg or soy allergy known to patient; No issues known to pt with past sedation with any surgeries or procedures; Patient denies ever being told they had issues or difficulty with intubation;  No FH of Malignant Hyperthermia; Pt is not on diet pills; Pt is not on home 02;  Pt is not on blood thinners;  Pt denies issues with constipation;  No A fib or A flutter; Have any cardiac testing pending--NO Pt instructed to use Single[employer] or GoodRx for a price reduction on prep;   Insurance verified during PV appt=Medicare  Patient's chart reviewed by Cathlyn Parsons CNRA prior to previsit and patient appropriate for the [facility].  Previsit completed and red dot placed by patient's name on their procedure day (on provider's schedule).    Instructions printed as well as sent to patient's MyChart account per his request;

## 2023-03-09 ENCOUNTER — Encounter: Payer: Self-pay | Admitting: Internal Medicine

## 2023-03-09 ENCOUNTER — Ambulatory Visit (INDEPENDENT_AMBULATORY_CARE_PROVIDER_SITE_OTHER): Payer: Medicare Other | Admitting: Internal Medicine

## 2023-03-09 VITALS — BP 130/84 | HR 65 | Temp 98.2°F | Wt 142.3 lb

## 2023-03-09 DIAGNOSIS — R5383 Other fatigue: Secondary | ICD-10-CM

## 2023-03-09 DIAGNOSIS — R410 Disorientation, unspecified: Secondary | ICD-10-CM | POA: Diagnosis not present

## 2023-03-09 DIAGNOSIS — R319 Hematuria, unspecified: Secondary | ICD-10-CM | POA: Diagnosis not present

## 2023-03-09 LAB — URINALYSIS, ROUTINE W REFLEX MICROSCOPIC
Bilirubin Urine: NEGATIVE
Ketones, ur: NEGATIVE
Leukocytes,Ua: NEGATIVE
Nitrite: NEGATIVE
Specific Gravity, Urine: 1.01 (ref 1.000–1.030)
Total Protein, Urine: NEGATIVE
Urine Glucose: NEGATIVE
Urobilinogen, UA: 0.2 (ref 0.0–1.0)
pH: 6 (ref 5.0–8.0)

## 2023-03-09 LAB — POC URINALSYSI DIPSTICK (AUTOMATED)
Bilirubin, UA: NEGATIVE
Glucose, UA: NEGATIVE
Ketones, UA: NEGATIVE
Leukocytes, UA: NEGATIVE
Nitrite, UA: NEGATIVE
Protein, UA: POSITIVE — AB
Spec Grav, UA: 1.015 (ref 1.010–1.025)
Urobilinogen, UA: 0.2 E.U./dL
pH, UA: 6 (ref 5.0–8.0)

## 2023-03-09 LAB — POC COVID19 BINAXNOW: SARS Coronavirus 2 Ag: NEGATIVE

## 2023-03-09 MED ORDER — SULFAMETHOXAZOLE-TRIMETHOPRIM 800-160 MG PO TABS
1.0000 | ORAL_TABLET | Freq: Two times a day (BID) | ORAL | 0 refills | Status: AC
Start: 2023-03-09 — End: 2023-03-16

## 2023-03-09 NOTE — Progress Notes (Signed)
Established Patient Office Visit     CC/Reason for Visit: Discuss acute concerns  HPI: Jason Farmer is a 88 y.o. male who is coming in today for the above mentioned reasons.  He has been having a constellation of acute symptoms that have been present for the last 3 days.  He has been feeling dizzy, nauseated, extremely fatigued.  He has felt disoriented and 1 day experienced visual hallucinations (seeing people that he now knows were not there).  There have been no new medications.  He does not think he has had a fever.  [place] in [place] and there have been 5 people sick with COVID on his floor.   Past Medical/Surgical History: Past Medical History:  Diagnosis Date   Allergy    Anxiety    "not a problem for a long time" (11/27/2016)   Arthritis    "wrists, knees, shoulders, back" (11/27/2016)   Chronic lower back pain    "5th disc is protruding" (11/27/2016)   Essential tremor    GERD (gastroesophageal reflux disease)    Glaucoma    Heart murmur    dx'd in [employer] in the 1960s; haven't been seen for it since "(11/27/2016)   Hepatitis ~ 1958   "jaundice kind"   HOH (hard of hearing)    Hyperlipemia    Migraine    "none since my neck OR" (11/27/2016)   Seasonal allergies    TIA (transient ischemic attack) 04/2015   Wears glasses    Wears hearing aid     Past Surgical History:  Procedure Laterality Date   ANTERIOR CERVICAL DECOMP/DISCECTOMY FUSION  2007   APPENDECTOMY     BACK SURGERY     CARPAL TUNNEL RELEASE Right 10/12/2013   Procedure: RIGHT CARPAL TUNNEL RELEASE;  Surgeon: Nicki Reaper, MD;  Location: [facility];  Service: Orthopedics;  Laterality: Right;   CATARACT EXTRACTION W/ INTRAOCULAR LENS  IMPLANT, BILATERAL Bilateral    COLONOSCOPY     HEMORRHOID SURGERY     KNEE ARTHROSCOPY Left 1970s   NASAL SEPTOPLASTY W/ TURBINOPLASTY Bilateral 02/15/2013   Procedure: NASAL SEPTOPLASTY WITH BILATER TURBINATE REDUCTION;  Surgeon: Darletta Moll,  MD;  Location: [facility];  Service: ENT;  Laterality: Bilateral;   TONSILLECTOMY     TRIGGER FINGER RELEASE  06/08/2012   Procedure: RELEASE TRIGGER FINGER/A-1 PULLEY;  Surgeon: Nicki Reaper, MD;  Location: [facility];  Service: Orthopedics;  Laterality: Left;  Release A-1 Pulley Left Long Finger   TRIGGER FINGER RELEASE  07/14/2012   Procedure: RELEASE TRIGGER FINGER/A-1 PULLEY;  Surgeon: Nicki Reaper, MD;  Location: [facility];  Service: Orthopedics;  Laterality: Right;   TRIGGER FINGER RELEASE Right 10/12/2013   Procedure: RELEASE TRIGGER FINGER/A-1 PULLEY RIGHT MIDDLE FINGER;  Surgeon: Nicki Reaper, MD;  Location: [facility];  Service: Orthopedics;  Laterality: Right;   UPPER GASTROINTESTINAL ENDOSCOPY      Social History:  reports that he quit smoking about 38 years ago. His smoking use included cigarettes. He started smoking about 68 years ago. He has a 60 pack-year smoking history. He has never used smokeless tobacco. He reports current alcohol use of about 1.0 standard drink of alcohol per week. He reports that he does not use drugs.  Allergies: Allergies  Allergen Reactions   Statins Other (See Comments)    Muscle cramps    Aspirin Other (See Comments)    bleeding  Family History:  Family History  Problem Relation Age of Onset   Glaucoma Mother    Dementia Father    Heart failure Father    Colon cancer Neg Hx    Rectal cancer Neg Hx    Stomach cancer Neg Hx    Esophageal cancer Neg Hx    Colon polyps Neg Hx      Current Outpatient Medications:    acetaminophen (TYLENOL) 325 MG tablet, Take 325 mg by mouth every 6 (six) hours as needed for moderate pain or headache., Disp: , Rfl:    azelastine (ASTELIN) 0.1 % nasal spray, Place 2 sprays into both nostrils 3 times/day as needed-between meals & bedtime for allergies. Use in each nostril as directed, Disp: , Rfl:    Cholecalciferol (VITAMIN D3) 125 MCG (5000  UT) CAPS, Take 5,000 Units by mouth daily., Disp: , Rfl:    ELIQUIS 5 MG TABS tablet, Take 1 tablet (5 mg total) by mouth 2 (two) times daily., Disp: 60 tablet, Rfl: 0   escitalopram (LEXAPRO) 10 MG tablet, TAKE ONE TABLET BY MOUTH ONCE DAILY, Disp: 30 tablet, Rfl: 0   ezetimibe (ZETIA) 10 MG tablet, Take 1 tablet (10 mg total) by mouth daily., Disp: 30 tablet, Rfl: 0   latanoprost (XALATAN) 0.005 % ophthalmic solution, Place 1 drop into both eyes at bedtime. , Disp: , Rfl:    metoprolol tartrate (LOPRESSOR) 50 MG tablet, Take 1 tablet (50 mg total) by mouth 2 (two) times daily., Disp: 60 tablet, Rfl: 0   mirtazapine (REMERON) 15 MG tablet, TAKE ONE TABLET BY MOUTH AT BEDTIME, Disp: 30 tablet, Rfl: 0   omeprazole (PRILOSEC) 20 MG capsule, TAKE ONE CAPSULE BY MOUTH ONCE DAILY, Disp: 30 capsule, Rfl: 0   simvastatin (ZOCOR) 10 MG tablet, Take 1 tablet (10 mg total) by mouth every other day., Disp: 90 tablet, Rfl: 0  Review of Systems:  Negative unless indicated in HPI.   Physical Exam: Vitals:   03/09/23 1016  BP: 130/84  Pulse: 65  Temp: 98.2 F (36.8 C)  TempSrc: Oral  SpO2: 96%  Weight: 142 lb 4.8 oz (64.5 kg)    Body mass index is 21.64 kg/m.   Physical Exam Vitals reviewed.  Constitutional:      Appearance: Normal appearance.  HENT:     Head: Normocephalic and atraumatic.  Eyes:     Conjunctiva/sclera: Conjunctivae normal.     Pupils: Pupils are equal, round, and reactive to light.  Skin:    General: Skin is warm and dry.  Neurological:     General: No focal deficit present.     Mental Status: He is alert and oriented to person, place, and time.  Psychiatric:        Mood and Affect: Mood normal.        Behavior: Behavior normal.        Thought Content: Thought content normal.        Judgment: Judgment normal.      Impression and Plan:  Hematuria, unspecified type -     Urinalysis; Future -     Urine Culture; Future  Fatigue, unspecified type -     POCT  Urinalysis Dipstick (Automated) -     POC COVID-19 BinaxNow  Confusion -     POCT Urinalysis Dipstick (Automated) -     POC COVID-19 BinaxNow   -In office urine dipstick with 3+ blood however no leukocytes, nitrates or bacteria.  Nonetheless given his acute symptoms we will treat  as a potential UTI with Bactrim.  Will send for culture. -In office COVID test has resulted negative. -If symptoms have not improved over the next 10 to 14 days, consideration should be given to an MRI to rule out space-occupying lesion or CVA. -Urine analysis should also be redone to ensure hematuria has resolved.  Time spent:32 minutes reviewing chart, interviewing and examining patient and formulating plan of care.     Chaya Jan, MD [facility]

## 2023-03-11 LAB — URINE CULTURE
MICRO NUMBER:: 15200783
Result:: NO GROWTH
SPECIMEN QUALITY:: ADEQUATE

## 2023-03-12 ENCOUNTER — Ambulatory Visit (AMBULATORY_SURGERY_CENTER): Payer: Medicare Other | Admitting: Gastroenterology

## 2023-03-12 ENCOUNTER — Encounter: Payer: Self-pay | Admitting: Gastroenterology

## 2023-03-12 VITALS — BP 99/56 | HR 85 | Temp 97.3°F | Resp 15 | Ht 68.0 in | Wt 145.0 lb

## 2023-03-12 DIAGNOSIS — K222 Esophageal obstruction: Secondary | ICD-10-CM

## 2023-03-12 DIAGNOSIS — K229 Disease of esophagus, unspecified: Secondary | ICD-10-CM | POA: Diagnosis not present

## 2023-03-12 DIAGNOSIS — K219 Gastro-esophageal reflux disease without esophagitis: Secondary | ICD-10-CM | POA: Diagnosis not present

## 2023-03-12 DIAGNOSIS — R1310 Dysphagia, unspecified: Secondary | ICD-10-CM | POA: Diagnosis not present

## 2023-03-12 MED ORDER — SODIUM CHLORIDE 0.9 % IV SOLN
500.0000 mL | INTRAVENOUS | Status: DC
Start: 2023-03-12 — End: 2023-03-12

## 2023-03-12 NOTE — Op Note (Signed)
[facility] Patient Name: Jason Farmer Procedure Date: 03/12/2023 10:36 AM MRN: [id number] Endoscopist: Meryl Dare , MD, [phone] Age: 88 Referring MD:  Date of Birth: 08-28-1934 Gender: Male Account #: [id number] Procedure:                Upper GI endoscopy Indications:              Dysphagia, Esophageal stricture Medicines:                Monitored Anesthesia Care Procedure:                Pre-Anesthesia Assessment:                           - Prior to the procedure, a History and Physical                            was performed, and patient medications and                            allergies were reviewed. The patient's tolerance of                            previous anesthesia was also reviewed. The risks                            and benefits of the procedure and the sedation                            options and risks were discussed with the patient.                            All questions were answered, and informed consent                            was obtained. Prior Anticoagulants: The patient has                            taken Eliquis (apixaban), last dose was 2 days                            prior to procedure. After reviewing the risks and                            benefits, the patient was deemed in satisfactory                            condition to undergo the procedure.                           After obtaining informed consent, the endoscope was                            passed under direct vision. Throughout the  procedure, the patient's blood pressure, pulse, and                            oxygen saturations were monitored continuously. The                            GIF [id number] #[id number] was introduced through the                            mouth, and advanced to the second part of duodenum.                            The upper GI endoscopy was accomplished without                            difficulty. The  patient tolerated the procedure                            well. Scope In: Scope Out: Findings:                 One benign-appearing, intrinsic moderate stenosis                            was found at the gastroesophageal junction. This                            stenosis measured 1.3 cm (inner diameter) x less                            than one cm (in length). The stenosis was                            traversed. A guidewire was placed and the scope was                            withdrawn. Dilations were performed with Savary                            dilators with mild resistance at 15 mm, 16 mm, 17                            mm. The dilation site was examined following                            endoscope reinsertion and showed moderate                            improvement in luminal narrowing. Biopsies were                            taken with a cold forceps for histology.  The exam of the esophagus was otherwise normal.                           A medium-sized hiatal hernia was present.                           The exam of the stomach was otherwise normal.                           The duodenal bulb and second portion of the                            duodenum were normal. Complications:            No immediate complications. Estimated Blood Loss:     Estimated blood loss was minimal. Impression:               - Benign-appearing esophageal stenosis. Dilated.                            Biopsied.                           - Medium-sized hiatal hernia.                           - Normal duodenal bulb and second portion of the                            duodenum. Recommendation:           - Patient has a contact number available for                            emergencies. The signs and symptoms of potential                            delayed complications were discussed with the                            patient. Return to normal activities tomorrow.                             Written discharge instructions were provided to the                            patient.                           - Clear liquid diet for 2 hours, then advance as                            tolerated to soft diet today.                           - Resume prior diet tomorrow.                           -  Continue present medications.                           - Await pathology results.                           - Resume Eliquis (apixaban) at prior dose in 2                            days. Refer to managing physician for further                            adjustment of therapy. Meryl Dare, MD 03/12/2023 11:07:31 AM This report has been signed electronically.

## 2023-03-12 NOTE — Progress Notes (Signed)
See 03/09/2023 H&P no changes

## 2023-03-12 NOTE — Progress Notes (Signed)
Pt's states no medical or surgical changes since previsit or office visit. 

## 2023-03-12 NOTE — Progress Notes (Signed)
Sedate, gd SR, tolerated procedure well, VSS, report to RN 

## 2023-03-12 NOTE — Patient Instructions (Addendum)
RESUME Eliquis at prior dose in 2 days.  Refer to managing physician for further adjustment of therapy.  Handouts Provided:  Post Dilation Diet  YOU HAD AN ENDOSCOPIC PROCEDURE TODAY AT THE [facility]:   Refer to the procedure report that was given to you for any specific questions about what was found during the examination.  If the procedure report does not answer your questions, please call your gastroenterologist to clarify.  If you requested that your care partner not be given the details of your procedure findings, then the procedure report has been included in a sealed envelope for you to review at your convenience later.  YOU SHOULD EXPECT: Some feelings of bloating in the abdomen. Passage of more gas than usual.  Walking can help get rid of the air that was put into your GI tract during the procedure and reduce the bloating. If you had a lower endoscopy (such as a colonoscopy or flexible sigmoidoscopy) you may notice spotting of blood in your stool or on the toilet paper. If you underwent a bowel prep for your procedure, you may not have a normal bowel movement for a few days.  Please Note:  You might notice some irritation and congestion in your nose or some drainage.  This is from the oxygen used during your procedure.  There is no need for concern and it should clear up in a day or so.  SYMPTOMS TO REPORT IMMEDIATELY:  Following upper endoscopy (EGD)  Vomiting of blood or coffee ground material  New chest pain or pain under the shoulder blades  Painful or persistently difficult swallowing  New shortness of breath  Fever of 100F or higher  Black, tarry-looking stools  For urgent or emergent issues, a gastroenterologist can be reached at any hour by calling [phone]. Do not use MyChart messaging for urgent concerns.    DIET:  Please see post dilation diet provided.  Drink plenty of fluids but you should avoid alcoholic beverages for 24 hours.  ACTIVITY:  You  should plan to take it easy for the rest of today and you should NOT DRIVE or use heavy machinery until tomorrow (because of the sedation medicines used during the test).    FOLLOW UP: Our staff will call the number listed on your records the next business day following your procedure.  We will call around 7:15- 8:00 am to check on you and address any questions or concerns that you may have regarding the information given to you following your procedure. If we do not reach you, we will leave a message.     If any biopsies were taken you will be contacted by phone or by letter within the next 1-3 weeks.  Please call us at [phone] if you have not heard about the biopsies in 3 weeks.    SIGNATURES/CONFIDENTIALITY: You and/or your care partner have signed paperwork which will be entered into your electronic medical record.  These signatures attest to the fact that that the information above on your After Visit Summary has been reviewed and is understood.  Full responsibility of the confidentiality of this discharge information lies with you and/or your care-partner.

## 2023-03-23 ENCOUNTER — Ambulatory Visit (INDEPENDENT_AMBULATORY_CARE_PROVIDER_SITE_OTHER): Payer: Medicare Other

## 2023-03-23 ENCOUNTER — Telehealth: Payer: Self-pay

## 2023-03-23 VITALS — Ht 68.0 in | Wt 142.0 lb

## 2023-03-23 DIAGNOSIS — Z0000 Encounter for general adult medical examination without abnormal findings: Secondary | ICD-10-CM

## 2023-03-23 NOTE — Telephone Encounter (Signed)
Patient request not to be rescheduled for future AWV.

## 2023-03-23 NOTE — Patient Instructions (Addendum)
Jason Farmer , Thank you for taking time to come for your Medicare Wellness Visit. I appreciate your ongoing commitment to your health goals. Please review the following plan we discussed and let me know if I can assist you in the future.   Referrals/Orders/Follow-Ups/Clinician Recommendations:   This is a list of the screening recommended for you and due dates:  Health Maintenance  Topic Date Due   Zoster (Shingles) Vaccine (1 of 2) 04/26/1987   COVID-19 Vaccine (5 - 2023-24 season) 04/08/2023*   Flu Shot  03/26/2023   Medicare Annual Wellness Visit  03/22/2024   DTaP/Tdap/Td vaccine (3 - Td or Tdap) 08/11/2028   Pneumonia Vaccine  Completed   HPV Vaccine  Aged Out  *Topic was postponed. The date shown is not the original due date.    Advanced directives: (Copy Requested) Please bring a copy of your health care power of attorney and living will to the office to be added to your chart at your convenience.  Next Medicare Annual Wellness Visit scheduled for next year: Yes  Preventive Care 86 Years and Older, Male  Preventive care refers to lifestyle choices and visits with your health care provider that can promote health and wellness. What does preventive care include? A yearly physical exam. This is also called an annual well check. Dental exams once or twice a year. Routine eye exams. Ask your health care provider how often you should have your eyes checked. Personal lifestyle choices, including: Daily care of your teeth and gums. Regular physical activity. Eating a healthy diet. Avoiding tobacco and drug use. Limiting alcohol use. Practicing safe sex. Taking low doses of aspirin every day. Taking vitamin and mineral supplements as recommended by your health care provider. What happens during an annual well check? The services and screenings done by your health care provider during your annual well check will depend on your age, overall health, lifestyle risk factors, and  family history of disease. Counseling  Your health care provider may ask you questions about your: Alcohol use. Tobacco use. Drug use. Emotional well-being. Home and relationship well-being. Sexual activity. Eating habits. History of falls. Memory and ability to understand (cognition). Work and work Astronomer. Screening  You may have the following tests or measurements: Height, weight, and BMI. Blood pressure. Lipid and cholesterol levels. These may be checked every 5 years, or more frequently if you are over 25 years old. Skin check. Lung cancer screening. You may have this screening every year starting at age 40 if you have a 30-pack-year history of smoking and currently smoke or have quit within the past 15 years. Fecal occult blood test (FOBT) of the stool. You may have this test every year starting at age 20. Flexible sigmoidoscopy or colonoscopy. You may have a sigmoidoscopy every 5 years or a colonoscopy every 10 years starting at age 86. Prostate cancer screening. Recommendations will vary depending on your family history and other risks. Hepatitis C blood test. Hepatitis B blood test. Sexually transmitted disease (STD) testing. Diabetes screening. This is done by checking your blood sugar (glucose) after you have not eaten for a while (fasting). You may have this done every 1-3 years. Abdominal aortic aneurysm (AAA) screening. You may need this if you are a current or former smoker. Osteoporosis. You may be screened starting at age 86 if you are at high risk. Talk with your health care provider about your test results, treatment options, and if necessary, the need for more tests. Vaccines  Your health  care provider may recommend certain vaccines, such as: Influenza vaccine. This is recommended every year. Tetanus, diphtheria, and acellular pertussis (Tdap, Td) vaccine. You may need a Td booster every 10 years. Zoster vaccine. You may need this after age 86. Pneumococcal  13-valent conjugate (PCV13) vaccine. One dose is recommended after age 86. Pneumococcal polysaccharide (PPSV23) vaccine. One dose is recommended after age 86. Talk to your health care provider about which screenings and vaccines you need and how often you need them. This information is not intended to replace advice given to you by your health care provider. Make sure you discuss any questions you have with your health care provider. Document Released: 09/07/2015 Document Revised: 04/30/2016 Document Reviewed: 06/12/2015 Elsevier Interactive Patient Education  2017 [employer].  Fall Prevention in the Home Falls can cause injuries. They can happen to people of all ages. There are many things you can do to make your home safe and to help prevent falls. What can I do on the outside of my home? Regularly fix the edges of walkways and driveways and fix any cracks. Remove anything that might make you trip as you walk through a door, such as a raised step or threshold. Trim any bushes or trees on the path to your home. Use bright outdoor lighting. Clear any walking paths of anything that might make someone trip, such as rocks or tools. Regularly check to see if handrails are loose or broken. Make sure that both sides of any steps have handrails. Any raised decks and porches should have guardrails on the edges. Have any leaves, snow, or ice cleared regularly. Use sand or salt on walking paths during winter. Clean up any spills in your garage right away. This includes oil or grease spills. What can I do in the bathroom? Use night lights. Install grab bars by the toilet and in the tub and shower. Do not use towel bars as grab bars. Use non-skid mats or decals in the tub or shower. If you need to sit down in the shower, use a plastic, non-slip stool. Keep the floor dry. Clean up any water that spills on the floor as soon as it happens. Remove soap buildup in the tub or shower regularly. Attach  bath mats securely with double-sided non-slip rug tape. Do not have throw rugs and other things on the floor that can make you trip. What can I do in the bedroom? Use night lights. Make sure that you have a light by your bed that is easy to reach. Do not use any sheets or blankets that are too big for your bed. They should not hang down onto the floor. Have a firm chair that has side arms. You can use this for support while you get dressed. Do not have throw rugs and other things on the floor that can make you trip. What can I do in the kitchen? Clean up any spills right away. Avoid walking on wet floors. Keep items that you use a lot in easy-to-reach places. If you need to reach something above you, use a strong step stool that has a grab bar. Keep electrical cords out of the way. Do not use floor polish or wax that makes floors slippery. If you must use wax, use non-skid floor wax. Do not have throw rugs and other things on the floor that can make you trip. What can I do with my stairs? Do not leave any items on the stairs. Make sure that there are handrails on  both sides of the stairs and use them. Fix handrails that are broken or loose. Make sure that handrails are as long as the stairways. Check any carpeting to make sure that it is firmly attached to the stairs. Fix any carpet that is loose or worn. Avoid having throw rugs at the top or bottom of the stairs. If you do have throw rugs, attach them to the floor with carpet tape. Make sure that you have a light switch at the top of the stairs and the bottom of the stairs. If you do not have them, ask someone to add them for you. What else can I do to help prevent falls? Wear shoes that: Do not have high heels. Have rubber bottoms. Are comfortable and fit you well. Are closed at the toe. Do not wear sandals. If you use a stepladder: Make sure that it is fully opened. Do not climb a closed stepladder. Make sure that both sides of the  stepladder are locked into place. Ask someone to hold it for you, if possible. Clearly mark and make sure that you can see: Any grab bars or handrails. First and last steps. Where the edge of each step is. Use tools that help you move around (mobility aids) if they are needed. These include: Canes. Walkers. Scooters. Crutches. Turn on the lights when you go into a dark area. Replace any light bulbs as soon as they burn out. Set up your furniture so you have a clear path. Avoid moving your furniture around. If any of your floors are uneven, fix them. If there are any pets around you, be aware of where they are. Review your medicines with your doctor. Some medicines can make you feel dizzy. This can increase your chance of falling. Ask your doctor what other things that you can do to help prevent falls. This information is not intended to replace advice given to you by your health care provider. Make sure you discuss any questions you have with your health care provider. Document Released: 06/07/2009 Document Revised: 01/17/2016 Document Reviewed: 09/15/2014 Elsevier Interactive Patient Education  2017 [employer].

## 2023-03-23 NOTE — Progress Notes (Signed)
Subjective:   Jason Farmer is a 88 y.o. male who presents for Medicare Annual/Subsequent preventive examination.  Visit Complete: Virtual  I connected with  Jason Farmer on 03/23/23 by a audio enabled telemedicine application and verified that I am speaking with the correct person using two identifiers.  Patient Location: Home  Provider Location: Home Office  I discussed the limitations of evaluation and management by telemedicine. The patient expressed understanding and agreed to proceed.  Patient Medicare AWV questionnaire was completed by the patient on  ; I have confirmed that all information answered by patient is correct and no changes since this date.  Review of Systems    Vital Signs: Unable to obtain new vitals due to this being a telehealth visit.  Cardiac Risk Factors include: advanced age (>61men, >55 women);male gender;hypertension     Objective:    Today's Vitals   03/23/23 1521  Weight: 142 lb (64.4 kg)  Height: 5\' 8"  (1.727 m)   Body mass index is 21.59 kg/m.     03/23/2023    3:29 PM 11/13/2022    2:00 AM 11/12/2022    5:02 PM 12/31/2021    2:20 PM 04/01/2021    1:30 PM 12/25/2020    2:01 PM 08/04/2018    6:40 AM  Advanced Directives  Does Patient Have a Medical Advance Directive? Yes Yes Yes Yes No Yes No  Type of Estate agent of [place];Living will Healthcare Power of [employer] Power of [employer] Power of [place];Living will  Living will   Does patient want to make changes to medical advance directive?  No - Patient declined       Copy of Healthcare Power of Attorney in Chart? No - copy requested No - copy requested  No - copy requested     Would patient like information on creating a medical advance directive?     No - Patient declined  No - Patient declined    Current Medications (verified) Outpatient Encounter Medications as of 03/23/2023  Medication Sig   acetaminophen (TYLENOL) 325 MG tablet Take  325 mg by mouth every 6 (six) hours as needed for moderate pain or headache.   azelastine (ASTELIN) 0.1 % nasal spray Place 2 sprays into both nostrils 3 times/day as needed-between meals & bedtime for allergies. Use in each nostril as directed   Cholecalciferol (VITAMIN D3) 125 MCG (5000 UT) CAPS Take 5,000 Units by mouth daily.   ELIQUIS 5 MG TABS tablet Take 1 tablet (5 mg total) by mouth 2 (two) times daily.   escitalopram (LEXAPRO) 10 MG tablet TAKE ONE TABLET BY MOUTH ONCE DAILY   ezetimibe (ZETIA) 10 MG tablet Take 1 tablet (10 mg total) by mouth daily.   latanoprost (XALATAN) 0.005 % ophthalmic solution Place 1 drop into both eyes at bedtime.    metoprolol tartrate (LOPRESSOR) 50 MG tablet Take 1 tablet (50 mg total) by mouth 2 (two) times daily.   mirtazapine (REMERON) 15 MG tablet TAKE ONE TABLET BY MOUTH AT BEDTIME   omeprazole (PRILOSEC) 20 MG capsule TAKE ONE CAPSULE BY MOUTH ONCE DAILY   simvastatin (ZOCOR) 10 MG tablet Take 1 tablet (10 mg total) by mouth every other day.   No facility-administered encounter medications on file as of 03/23/2023.    Allergies (verified) Statins and Aspirin   History: Past Medical History:  Diagnosis Date   Allergy    Anxiety    "not a problem for a long time" (11/27/2016)  Arthritis    "wrists, knees, shoulders, back" (11/27/2016)   Chronic lower back pain    "5th disc is protruding" (11/27/2016)   Essential tremor    GERD (gastroesophageal reflux disease)    Glaucoma    Heart murmur    dx'd in [employer] in the 1960s; haven't been seen for it since "(11/27/2016)   Hepatitis ~ 1958   "jaundice kind"   HOH (hard of hearing)    Hyperlipemia    Migraine    "none since my neck OR" (11/27/2016)   Seasonal allergies    TIA (transient ischemic attack) 04/2015   Wears glasses    Wears hearing aid    Past Surgical History:  Procedure Laterality Date   ANTERIOR CERVICAL DECOMP/DISCECTOMY FUSION  2007   APPENDECTOMY     BACK SURGERY     CARPAL  TUNNEL RELEASE Right 10/12/2013   Procedure: RIGHT CARPAL TUNNEL RELEASE;  Surgeon: Nicki Reaper, MD;  Location: [facility];  Service: Orthopedics;  Laterality: Right;   CATARACT EXTRACTION W/ INTRAOCULAR LENS  IMPLANT, BILATERAL Bilateral    COLONOSCOPY     HEMORRHOID SURGERY     KNEE ARTHROSCOPY Left 1970s   NASAL SEPTOPLASTY W/ TURBINOPLASTY Bilateral 02/15/2013   Procedure: NASAL SEPTOPLASTY WITH BILATER TURBINATE REDUCTION;  Surgeon: Darletta Moll, MD;  Location: [facility];  Service: ENT;  Laterality: Bilateral;   TONSILLECTOMY     TRIGGER FINGER RELEASE  06/08/2012   Procedure: RELEASE TRIGGER FINGER/A-1 PULLEY;  Surgeon: Nicki Reaper, MD;  Location: [facility];  Service: Orthopedics;  Laterality: Left;  Release A-1 Pulley Left Long Finger   TRIGGER FINGER RELEASE  07/14/2012   Procedure: RELEASE TRIGGER FINGER/A-1 PULLEY;  Surgeon: Nicki Reaper, MD;  Location: [facility];  Service: Orthopedics;  Laterality: Right;   TRIGGER FINGER RELEASE Right 10/12/2013   Procedure: RELEASE TRIGGER FINGER/A-1 PULLEY RIGHT MIDDLE FINGER;  Surgeon: Nicki Reaper, MD;  Location: [facility];  Service: Orthopedics;  Laterality: Right;   UPPER GASTROINTESTINAL ENDOSCOPY     Family History  Problem Relation Age of Onset   Glaucoma Mother    Dementia Father    Heart failure Father    Colon cancer Neg Hx    Rectal cancer Neg Hx    Stomach cancer Neg Hx    Esophageal cancer Neg Hx    Colon polyps Neg Hx    Social History   Socioeconomic History   Marital status: Married    Spouse name: Not on file   Number of children: 4   Years of education: Not on file   Highest education level: Some college, no degree  Occupational History   Not on file  Tobacco Use   Smoking status: Former    Current packs/day: 0.00    Average packs/day: 2.0 packs/day for 30.0 years (60.0 ttl pk-yrs)    Types: Cigarettes    Start date: 06/03/1954     Quit date: 06/03/1984    Years since quitting: 38.8   Smokeless tobacco: Never   Tobacco comments:    had AAA at the [place]; also noted 2018 on Dr. Fabio Bering note   Vaping Use   Vaping status: Never Used  Substance and Sexual Activity   Alcohol use: Yes    Alcohol/week: 1.0 standard drink of alcohol    Types: 1 Cans of beer per week   Drug use: No   Sexual activity: Not Currently  Other Topics Concern  Not on file  Social History Narrative   Retired from [employer]   Married    4 children, one son lives locally   Social Determinants of Health   Financial Resource Strain: Low Risk  (03/23/2023)   Overall Financial Resource Strain (CARDIA)    Difficulty of Paying Living Expenses: Not hard at all  Food Insecurity: No Food Insecurity (03/23/2023)   Hunger Vital Sign    Worried About Running Out of Food in the Last Year: Never true    Ran Out of Food in the Last Year: Never true  Transportation Needs: No Transportation Needs (03/23/2023)   PRAPARE - Administrator, Civil Service (Medical): No    Lack of Transportation (Non-Medical): No  Physical Activity: Insufficiently Active (03/23/2023)   Exercise Vital Sign    Days of Exercise per Week: 3 days    Minutes of Exercise per Session: 40 min  Stress: No Stress Concern Present (03/23/2023)   [employer] of Occupational Health - Occupational Stress Questionnaire    Feeling of Stress : Not at all  Social Connections: Moderately Isolated (03/23/2023)   Social Connection and Isolation Panel [NHANES]    Frequency of Communication with Friends and Family: More than three times a week    Frequency of Social Gatherings with Friends and Family: More than three times a week    Attends Religious Services: Never    Database administrator or Organizations: No    Attends Engineer, structural: Never    Marital Status: Married    Tobacco Counseling Counseling given: Not Answered Tobacco comments: had AAA at the [place]; also  noted 2018 on Dr. Fabio Bering note    Clinical Intake:  Pre-visit preparation completed: No  Pain : No/denies pain     BMI - recorded: 21.59 Nutritional Status: BMI of 19-24  Normal Nutritional Risks: None Diabetes: No  How often do you need to have someone help you when you read instructions, pamphlets, or other written materials from your doctor or pharmacy?: 1 - Never  Interpreter Needed?: No  Information entered by :: Theresa Mulligan LPN   Activities of Daily Living    03/23/2023    3:28 PM 11/13/2022    2:02 AM  In your present state of health, do you have any difficulty performing the following activities:  Hearing? 1 0  Comment Wears hearing aids   Vision? 0 0  Difficulty concentrating or making decisions? 0 0  Walking or climbing stairs? 0 0  Dressing or bathing? 0 0  Doing errands, shopping? 0 0  Preparing Food and eating ? N   Using the Toilet? N   In the past six months, have you accidently leaked urine? N   Do you have problems with loss of bowel control? N   Managing your Medications? N   Managing your Finances? N   Housekeeping or managing your Housekeeping? N     Patient Care Team: [name], NP as PCP - General (Family Medicine) Drema Halon, MD (Inactive) as Consulting Physician (Otolaryngology) [facility] as Consulting Physician (Orthopedic Surgery) Verner Chol, [facility] (Inactive) as Pharmacist (Pharmacist)  Indicate any recent Medical Services you may have received from other than [facility] providers in the past year (date may be approximate).     Assessment:   This is a routine wellness examination for Amanda.  Hearing/Vision screen Hearing Screening - Comments:: Wears hearing aids Vision Screening - Comments:: Wears rx glasses - Not up to date with  routine eye exams with  Patient declined referral  Dietary issues and exercise activities discussed:     Goals Addressed               This Visit's Progress     Stay healthy  (pt-stated)         Depression Screen    03/23/2023    3:28 PM 03/23/2023    3:27 PM 03/09/2023   10:14 AM 03/20/2022    3:16 PM 03/20/2022    2:17 PM 12/31/2021    2:23 PM 11/28/2021    3:51 PM  PHQ 2/9 Scores  PHQ - 2 Score 0 0 1 3 3 1 1   PHQ- 9 Score 0 0 4 14 14  6     Fall Risk    03/23/2023    3:29 PM 03/09/2023   10:14 AM 12/31/2021    2:22 PM 11/28/2021    3:51 PM 11/28/2021   10:18 AM  Fall Risk   Falls in the past year? 0 0 1 1 1   Comment   ran in to something    Number falls in past yr: 0 0 1 1 1   Injury with Fall? 0 0 0 1 1  Risk for fall due to : No Fall Risks  Medication side effect Other (Comment)   Follow up Falls prevention discussed Falls evaluation completed Falls evaluation completed;Education provided;Falls prevention discussed Falls evaluation completed     MEDICARE RISK AT HOME:  Medicare Risk at Home - 03/23/23 1533     Any stairs in or around the home? Yes    If so, are there any without handrails? No    Home free of loose throw rugs in walkways, pet beds, electrical cords, etc? Yes    Adequate lighting in your home to reduce risk of falls? Yes    Life alert? No    Use of a cane, walker or w/c? No    Grab bars in the bathroom? No    Shower chair or bench in shower? No    Elevated toilet seat or a handicapped toilet? No             TIMED UP AND GO:  Was the test performed?  No    Cognitive Function:    12/23/2017    2:03 PM  MMSE - Mini Mental State Exam  Not completed: --      05/09/2021    3:00 PM  Montreal Cognitive Assessment   Visuospatial/ Executive (0/5) 5  Naming (0/3) 3  Attention: Read list of digits (0/2) 2  Attention: Read list of letters (0/1) 1  Attention: Serial 7 subtraction starting at 100 (0/3) 1  Language: Repeat phrase (0/2) 2  Language : Fluency (0/1) 1  Abstraction (0/2) 1  Delayed Recall (0/5) 0  Orientation (0/6) 5  Total 21  Adjusted Score (based on education) 21      03/23/2023    3:30 PM 12/31/2021    2:25  PM 12/25/2020    2:05 PM  6CIT Screen  What Year? 0 points 0 points 4 points  What month? 0 points 0 points 0 points  What time? 0 points 0 points   Count back from 20 0 points 0 points 0 points  Months in reverse 0 points 4 points 0 points  Repeat phrase 0 points 10 points 2 points  Total Score 0 points 14 points     Immunizations Immunization History  Administered Date(s) Administered   Fluad Quad(high Dose  65+) 06/12/2022   Influenza Split 09/03/2011   Influenza Whole 08/01/2010   Influenza, High Dose Seasonal PF 06/14/2013, 06/13/2015, 05/08/2016, 05/20/2017, 06/01/2017, 06/07/2018, 06/25/2018, 05/25/2020   Influenza, Seasonal, Injecte, Preservative Fre 09/06/2012   Influenza,inj,Quad PF,6+ Mos 05/29/2014   Influenza-Unspecified 05/25/2005, 09/09/2006, 06/02/2007, 05/31/2008, 08/29/2008, 09/08/2009, 07/25/2010, 09/03/2011, 08/25/2012, 06/25/2013, 06/25/2014, 06/13/2015, 04/25/2016   Moderna SARS-COV2 Booster Vaccination 04/14/2020   PFIZER(Purple Top)SARS-COV-2 Vaccination 09/14/2019, 10/05/2019   Pfizer Covid-19 Vaccine Bivalent Booster 66yrs & up 06/20/2021   Pneumococcal Conjugate-13 08/25/2013, 09/08/2013, 05/25/2017   Pneumococcal Polysaccharide-23 08/25/2005, 07/25/2009, 06/25/2018   Tdap 10/01/2011, 08/11/2018   Zoster, Live 09/06/2008    TDAP status: Up to date  Flu Vaccine status: Up to date  Pneumococcal vaccine status: Up to date  Covid-19 vaccine status: Completed vaccines  Qualifies for Shingles Vaccine? Yes   Zostavax completed No   Shingrix Completed?: No.    Education has been provided regarding the importance of this vaccine. Patient has been advised to call insurance company to determine out of pocket expense if they have not yet received this vaccine. Advised may also receive vaccine at local pharmacy or Health Dept. Verbalized acceptance and understanding.  Screening Tests Health Maintenance  Topic Date Due   Zoster Vaccines- Shingrix (1 of 2)  04/26/1987   COVID-19 Vaccine (5 - 2023-24 season) 04/08/2023 (Originally 04/25/2022)   INFLUENZA VACCINE  03/26/2023   Medicare Annual Wellness (AWV)  03/22/2024   DTaP/Tdap/Td (3 - Td or Tdap) 08/11/2028   Pneumonia Vaccine 77+ Years old  Completed   HPV VACCINES  Aged Out    Health Maintenance  Health Maintenance Due  Topic Date Due   Zoster Vaccines- Shingrix (1 of 2) 04/26/1987    Colorectal cancer screening: No longer required.   Lung Cancer Screening: (Low Dose CT Chest recommended if Age 44-80 years, 20 pack-year currently smoking OR have quit w/in 15years.) does not qualify.     Additional Screening:  Hepatitis C Screening: does not qualify; Completed   Vision Screening: Recommended annual ophthalmology exams for early detection of glaucoma and other disorders of the eye. Is the patient up to date with their annual eye exam?  No  Who is the provider or what is the name of the office in which the patient attends annual eye exams? Deferred If pt is not established with a provider, would they like to be referred to a provider to establish care? No . Patient declined  Dental Screening: Recommended annual dental exams for proper oral hygiene    Community Resource Referral / Chronic Care Management:  CRR required this visit?  No   CCM required this visit?  No     Plan:     I have personally reviewed and noted the following in the patient's chart:   Medical and social history Use of alcohol, tobacco or illicit drugs  Current medications and supplements including opioid prescriptions. Patient is not currently taking opioid prescriptions. Functional ability and status Nutritional status Physical activity Advanced directives List of other physicians Hospitalizations, surgeries, and ER visits in previous 12 months Vitals Screenings to include cognitive, depression, and falls Referrals and appointments  In addition, I have reviewed and discussed with patient  certain preventive protocols, quality metrics, and best practice recommendations. A written personalized care plan for preventive services as well as general preventive health recommendations were provided to patient.     Tillie Rung, LPN   02/21/5283   After Visit Summary: (MyChart) Due to this being a telephonic visit,  the after visit summary with patients personalized plan was offered to patient via MyChart   Nurse Notes: None

## 2023-03-26 ENCOUNTER — Encounter: Payer: Self-pay | Admitting: Gastroenterology

## 2023-03-31 ENCOUNTER — Encounter: Payer: Self-pay | Admitting: Adult Health

## 2023-03-31 NOTE — Telephone Encounter (Signed)
error 

## 2023-04-01 ENCOUNTER — Ambulatory Visit: Payer: Medicare Other | Admitting: Adult Health

## 2023-04-01 ENCOUNTER — Encounter: Payer: Self-pay | Admitting: Adult Health

## 2023-04-01 VITALS — BP 120/72 | HR 71 | Temp 97.9°F | Ht 68.0 in | Wt 144.0 lb

## 2023-04-01 DIAGNOSIS — R319 Hematuria, unspecified: Secondary | ICD-10-CM

## 2023-04-01 DIAGNOSIS — R42 Dizziness and giddiness: Secondary | ICD-10-CM

## 2023-04-01 DIAGNOSIS — R5383 Other fatigue: Secondary | ICD-10-CM | POA: Diagnosis not present

## 2023-04-01 DIAGNOSIS — I4891 Unspecified atrial fibrillation: Secondary | ICD-10-CM

## 2023-04-01 DIAGNOSIS — R110 Nausea: Secondary | ICD-10-CM

## 2023-04-01 LAB — URINALYSIS, ROUTINE W REFLEX MICROSCOPIC
Bilirubin Urine: NEGATIVE
Ketones, ur: NEGATIVE
Leukocytes,Ua: NEGATIVE
Nitrite: NEGATIVE
Specific Gravity, Urine: 1.02 (ref 1.000–1.030)
Total Protein, Urine: 30 — AB
Urine Glucose: NEGATIVE
Urobilinogen, UA: 0.2 (ref 0.0–1.0)
pH: 6 (ref 5.0–8.0)

## 2023-04-01 LAB — CBC WITH DIFFERENTIAL/PLATELET
Basophils Absolute: 0 10*3/uL (ref 0.0–0.1)
Basophils Relative: 0.3 % (ref 0.0–3.0)
Eosinophils Absolute: 0 10*3/uL (ref 0.0–0.7)
Eosinophils Relative: 0.6 % (ref 0.0–5.0)
HCT: 44.4 % (ref 39.0–52.0)
Hemoglobin: 14.7 g/dL (ref 13.0–17.0)
Lymphocytes Relative: 20.1 % (ref 12.0–46.0)
Lymphs Abs: 1.7 10*3/uL (ref 0.7–4.0)
MCHC: 33.2 g/dL (ref 30.0–36.0)
MCV: 91 fl (ref 78.0–100.0)
Monocytes Absolute: 0.6 10*3/uL (ref 0.1–1.0)
Monocytes Relative: 7.6 % (ref 3.0–12.0)
Neutro Abs: 5.9 10*3/uL (ref 1.4–7.7)
Neutrophils Relative %: 71.4 % (ref 43.0–77.0)
Platelets: 288 10*3/uL (ref 150.0–400.0)
RBC: 4.87 Mil/uL (ref 4.22–5.81)
RDW: 14.2 % (ref 11.5–15.5)
WBC: 8.3 10*3/uL (ref 4.0–10.5)

## 2023-04-01 LAB — COMPREHENSIVE METABOLIC PANEL
ALT: 14 U/L (ref 0–53)
AST: 16 U/L (ref 0–37)
Albumin: 4.2 g/dL (ref 3.5–5.2)
Alkaline Phosphatase: 58 U/L (ref 39–117)
BUN: 16 mg/dL (ref 6–23)
CO2: 28 mEq/L (ref 19–32)
Calcium: 9.8 mg/dL (ref 8.4–10.5)
Chloride: 100 mEq/L (ref 96–112)
Creatinine, Ser: 1.3 mg/dL (ref 0.40–1.50)
GFR: 49.94 mL/min — ABNORMAL LOW (ref >60.00)
Glucose, Bld: 96 mg/dL (ref 70–99)
Potassium: 4.3 mEq/L (ref 3.5–5.1)
Sodium: 136 mEq/L (ref 135–145)
Total Bilirubin: 0.7 mg/dL (ref 0.2–1.2)
Total Protein: 7.2 g/dL (ref 6.0–8.3)

## 2023-04-01 MED ORDER — ONDANSETRON HCL 4 MG PO TABS: 4.0000 mg | ORAL_TABLET | Freq: Three times a day (TID) | ORAL | 0 refills | Status: DC | PRN

## 2023-04-01 NOTE — Patient Instructions (Signed)
I am going to check some blood work on you today   I have also sent in a medication called Zofran that will help with nausea   I am also going to refer you to the afib clinic so that they can keep an eye on your heart

## 2023-04-01 NOTE — Progress Notes (Signed)
Subjective:    Patient ID: Jason Farmer, male    DOB: 1934/11/05, 88 y.o.   MRN: [id number]  HPI 88 year old male who  has a past medical history of Allergy, Anxiety, Arthritis, Chronic lower back pain, Essential tremor, GERD (gastroesophageal reflux disease), Glaucoma, Heart murmur, Hepatitis (~ 1958), HOH (hard of hearing), Hyperlipemia, Migraine, Seasonal allergies, TIA (transient ischemic attack) (04/2015), Wears glasses, and Wears hearing aid.  He is being evaluated today for multiple acute issues.  He reports that he has been feeling dizzy, nauseated, and extremely fatigued.  He was seen over roughly 3 weeks ago by another provider in the office for similar symptoms at which time he reported that [place] did [place] and there have been 5 people sick with COVID on his floor.  This time his urine dipstick showed 3+ blood however no leukocytes, nitrites or bacteria but due to his acute symptoms he was treated with a potential UTI with Bactrim, his urine culture was negative.  His in office COVID test was negative.   Today he reports that he continues to have nausea, dizziness, and fatigue.  His symptoms are daily occurrence but come and go throughout the day and he usually feels back to baseline by the end of the day.  Additionally he reports "pressure across the front of my head".  The dizziness feels more as a "woozy" and feels as though the wooziness causes the nausea.  He has not felt as though he is going to pass out.  He denies fevers or chills.  He does have known history of hematuria   He does have a history of Afib - diagnosed in March 2024, he refused follow-up with cardiology.  Does report that he has been taking his metoprolol and Eliquis. Eliquis is expensive for him costing $500-600 a month. We did sent in Xarelto but this did not seem to be any cheaper.   Review of Systems See HPI   Past Medical History:  Diagnosis Date   Allergy    Anxiety    "not a  problem for a long time" (11/27/2016)   Arthritis    "wrists, knees, shoulders, back" (11/27/2016)   Chronic lower back pain    "5th disc is protruding" (11/27/2016)   Essential tremor    GERD (gastroesophageal reflux disease)    Glaucoma    Heart murmur    dx'd in [employer] in the 1960s; haven't been seen for it since "(11/27/2016)   Hepatitis ~ 1958   "jaundice kind"   HOH (hard of hearing)    Hyperlipemia    Migraine    "none since my neck OR" (11/27/2016)   Seasonal allergies    TIA (transient ischemic attack) 04/2015   Wears glasses    Wears hearing aid     Social History   Socioeconomic History   Marital status: Married    Spouse name: Not on file   Number of children: 4   Years of education: Not on file   Highest education level: Some college, no degree  Occupational History   Not on file  Tobacco Use   Smoking status: Former    Current packs/day: 0.00    Average packs/day: 2.0 packs/day for 30.0 years (60.0 ttl pk-yrs)    Types: Cigarettes    Start date: 06/03/1954    Quit date: 06/03/1984    Years since quitting: 38.8   Smokeless tobacco: Never   Tobacco comments:    had AAA  at the [place]; also noted 2018 on Dr. Fabio Bering note   Vaping Use   Vaping status: Never Used  Substance and Sexual Activity   Alcohol use: Yes    Alcohol/week: 1.0 standard drink of alcohol    Types: 1 Cans of beer per week   Drug use: No   Sexual activity: Not Currently  Other Topics Concern   Not on file  Social History Narrative   Retired from [employer]   Married    4 children, one son lives locally   Social Determinants of Health   Financial Resource Strain: Low Risk  (03/23/2023)   Overall Financial Resource Strain (CARDIA)    Difficulty of Paying Living Expenses: Not hard at all  Food Insecurity: No Food Insecurity (03/23/2023)   Hunger Vital Sign    Worried About Running Out of Food in the Last Year: Never true    Ran Out of Food in the Last Year: Never true  Transportation Needs:  No Transportation Needs (03/23/2023)   PRAPARE - Administrator, Civil Service (Medical): No    Lack of Transportation (Non-Medical): No  Physical Activity: Insufficiently Active (03/23/2023)   Exercise Vital Sign    Days of Exercise per Week: 3 days    Minutes of Exercise per Session: 40 min  Stress: No Stress Concern Present (03/23/2023)   [employer] of Occupational Health - Occupational Stress Questionnaire    Feeling of Stress : Not at all  Social Connections: Moderately Isolated (03/23/2023)   Social Connection and Isolation Panel [NHANES]    Frequency of Communication with Friends and Family: More than three times a week    Frequency of Social Gatherings with Friends and Family: More than three times a week    Attends Religious Services: Never    Database administrator or Organizations: No    Attends Banker Meetings: Never    Marital Status: Married  Catering manager Violence: Not At Risk (03/23/2023)   Humiliation, Afraid, Rape, and Kick questionnaire    Fear of Current or Ex-Partner: No    Emotionally Abused: No    Physically Abused: No    Sexually Abused: No    Past Surgical History:  Procedure Laterality Date   ANTERIOR CERVICAL DECOMP/DISCECTOMY FUSION  2007   APPENDECTOMY     BACK SURGERY     CARPAL TUNNEL RELEASE Right 10/12/2013   Procedure: RIGHT CARPAL TUNNEL RELEASE;  Surgeon: Nicki Reaper, MD;  Location: [facility];  Service: Orthopedics;  Laterality: Right;   CATARACT EXTRACTION W/ INTRAOCULAR LENS  IMPLANT, BILATERAL Bilateral    COLONOSCOPY     HEMORRHOID SURGERY     KNEE ARTHROSCOPY Left 1970s   NASAL SEPTOPLASTY W/ TURBINOPLASTY Bilateral 02/15/2013   Procedure: NASAL SEPTOPLASTY WITH BILATER TURBINATE REDUCTION;  Surgeon: Darletta Moll, MD;  Location: [facility];  Service: ENT;  Laterality: Bilateral;   TONSILLECTOMY     TRIGGER FINGER RELEASE  06/08/2012   Procedure: RELEASE TRIGGER FINGER/A-1  PULLEY;  Surgeon: Nicki Reaper, MD;  Location: [facility];  Service: Orthopedics;  Laterality: Left;  Release A-1 Pulley Left Long Finger   TRIGGER FINGER RELEASE  07/14/2012   Procedure: RELEASE TRIGGER FINGER/A-1 PULLEY;  Surgeon: Nicki Reaper, MD;  Location: [facility];  Service: Orthopedics;  Laterality: Right;   TRIGGER FINGER RELEASE Right 10/12/2013   Procedure: RELEASE TRIGGER FINGER/A-1 PULLEY RIGHT MIDDLE FINGER;  Surgeon: Nicki Reaper, MD;  Location: [facility];  Service: Orthopedics;  Laterality: Right;   UPPER GASTROINTESTINAL ENDOSCOPY      Family History  Problem Relation Age of Onset   Glaucoma Mother    Dementia Father    Heart failure Father    Colon cancer Neg Hx    Rectal cancer Neg Hx    Stomach cancer Neg Hx    Esophageal cancer Neg Hx    Colon polyps Neg Hx     Allergies  Allergen Reactions   Statins Other (See Comments)    Muscle cramps    Aspirin Other (See Comments)    bleeding    Current Outpatient Medications on File Prior to Visit  Medication Sig Dispense Refill   acetaminophen (TYLENOL) 325 MG tablet Take 325 mg by mouth every 6 (six) hours as needed for moderate pain or headache.     azelastine (ASTELIN) 0.1 % nasal spray Place 2 sprays into both nostrils 3 times/day as needed-between meals & bedtime for allergies. Use in each nostril as directed     Cholecalciferol (VITAMIN D3) 125 MCG (5000 UT) CAPS Take 5,000 Units by mouth daily.     ELIQUIS 5 MG TABS tablet Take 1 tablet (5 mg total) by mouth 2 (two) times daily. 60 tablet 0   escitalopram (LEXAPRO) 10 MG tablet TAKE ONE TABLET BY MOUTH ONCE DAILY 30 tablet 0   ezetimibe (ZETIA) 10 MG tablet Take 1 tablet (10 mg total) by mouth daily. 30 tablet 0   latanoprost (XALATAN) 0.005 % ophthalmic solution Place 1 drop into both eyes at bedtime.      metoprolol tartrate (LOPRESSOR) 50 MG tablet Take 1 tablet (50 mg total) by mouth 2 (two) times daily. 60  tablet 0   mirtazapine (REMERON) 15 MG tablet TAKE ONE TABLET BY MOUTH AT BEDTIME 30 tablet 0   omeprazole (PRILOSEC) 20 MG capsule TAKE ONE CAPSULE BY MOUTH ONCE DAILY 30 capsule 0   simvastatin (ZOCOR) 10 MG tablet Take 1 tablet (10 mg total) by mouth every other day. 90 tablet 0   No current facility-administered medications on file prior to visit.    BP 120/72   Pulse 71   Temp 97.9 F (36.6 C) (Oral)   Ht 5\' 8"  (1.727 m)   Wt 144 lb (65.3 kg)   SpO2 97%   BMI 21.90 kg/m       Objective:   Physical Exam Vitals and nursing note reviewed.  Constitutional:      Appearance: Normal appearance.  HENT:     Right Ear: Hearing, tympanic membrane, ear canal and external ear normal.     Left Ear: Hearing, tympanic membrane, ear canal and external ear normal.     Nose: No congestion or rhinorrhea.     Right Turbinates: Not enlarged or swollen.     Left Turbinates: Not enlarged or swollen.     Right Sinus: No maxillary sinus tenderness or frontal sinus tenderness.     Left Sinus: No maxillary sinus tenderness or frontal sinus tenderness.     Mouth/Throat:     Pharynx: Oropharynx is clear. Uvula midline.  Eyes:     Extraocular Movements:     Right eye: Normal extraocular motion and no nystagmus.     Left eye: Normal extraocular motion and no nystagmus.     Conjunctiva/sclera: Conjunctivae normal.  Cardiovascular:     Rate and Rhythm: Normal rate and regular rhythm.     Pulses: Normal pulses.  Heart sounds: Normal heart sounds.  Pulmonary:     Effort: Pulmonary effort is normal.     Breath sounds: Normal breath sounds.  Musculoskeletal:        General: Normal range of motion.  Skin:    General: Skin is warm and dry.  Neurological:     General: No focal deficit present.     Mental Status: He is alert and oriented to person, place, and time.  Psychiatric:        Mood and Affect: Mood normal.        Behavior: Behavior normal.        Thought Content: Thought content  normal.        Judgment: Judgment normal.        Assessment & Plan:  1. Fatigue, unspecified type - Unknown cause possibly d/t nausea and dizziness. I do not think it is Afib related as he was in sinus rhythm today. Will check blood work today.  - CBC with Differential/Platelet; Future - Comprehensive metabolic panel; Future - Comprehensive metabolic panel - CBC with Differential/Platelet  2. Hematuria, unspecified type - Consider referral to Urology  - Urinalysis; Future - Urinalysis  3. Nausea  - ondansetron (ZOFRAN) 4 MG tablet; Take 1 tablet (4 mg total) by mouth every 8 (eight) hours as needed for nausea or vomiting.  Dispense: 20 tablet; Refill: 0  4. Atrial fibrillation, unspecified type (HCC) - Will refer to afib clinic due to history - Amb Referral to AFIB Clinic  5. Dizziness - No nystagmus in the office today but I am wondering if he is having bouts of vertigo causing his symptoms. Will send in Zofran to help with symptoms. We discussed MRI brain but he does not want an MRI at this time  - CBC with Differential/Platelet; Future - Comprehensive metabolic panel; Future - ondansetron (ZOFRAN) 4 MG tablet; Take 1 tablet (4 mg total) by mouth every 8 (eight) hours as needed for nausea or vomiting.  Dispense: 20 tablet; Refill: 0 - Comprehensive metabolic panel - CBC with Differential/Platelet  [name], NP  Time spent with patient today was 32 minutes which consisted of chart review, discussing dizziness, nausea, and afib, work up, treatment answering questions and documentation.

## 2023-04-02 ENCOUNTER — Other Ambulatory Visit: Payer: Self-pay

## 2023-04-02 ENCOUNTER — Emergency Department (HOSPITAL_COMMUNITY): Payer: Medicare Other

## 2023-04-02 ENCOUNTER — Emergency Department (HOSPITAL_COMMUNITY)
Admission: EM | Admit: 2023-04-02 | Discharge: 2023-04-02 | Disposition: A | Discharge status: Home / Self Care | Payer: Medicare Other | Attending: Emergency Medicine | Admitting: Emergency Medicine

## 2023-04-02 ENCOUNTER — Telehealth: Payer: Self-pay | Admitting: Adult Health

## 2023-04-02 DIAGNOSIS — E785 Hyperlipidemia, unspecified: Secondary | ICD-10-CM | POA: Diagnosis not present

## 2023-04-02 DIAGNOSIS — I4891 Unspecified atrial fibrillation: Secondary | ICD-10-CM | POA: Diagnosis not present

## 2023-04-02 DIAGNOSIS — R410 Disorientation, unspecified: Secondary | ICD-10-CM | POA: Insufficient documentation

## 2023-04-02 DIAGNOSIS — Z7901 Long term (current) use of anticoagulants: Secondary | ICD-10-CM | POA: Insufficient documentation

## 2023-04-02 DIAGNOSIS — R5383 Other fatigue: Secondary | ICD-10-CM | POA: Diagnosis not present

## 2023-04-02 DIAGNOSIS — Z8673 Personal history of transient ischemic attack (TIA), and cerebral infarction without residual deficits: Secondary | ICD-10-CM | POA: Diagnosis not present

## 2023-04-02 DIAGNOSIS — R443 Hallucinations, unspecified: Secondary | ICD-10-CM | POA: Diagnosis not present

## 2023-04-02 DIAGNOSIS — Z1152 Encounter for screening for COVID-19: Secondary | ICD-10-CM | POA: Insufficient documentation

## 2023-04-02 DIAGNOSIS — R4182 Altered mental status, unspecified: Secondary | ICD-10-CM | POA: Diagnosis not present

## 2023-04-02 LAB — CBC
HCT: 43.8 % (ref 39.0–52.0)
Hemoglobin: 14.3 g/dL (ref 13.0–17.0)
MCH: 29.5 pg (ref 26.0–34.0)
MCHC: 32.6 g/dL (ref 30.0–36.0)
MCV: 90.5 fL (ref 80.0–100.0)
Platelets: 253 10*3/uL (ref 150–400)
RBC: 4.84 MIL/uL (ref 4.22–5.81)
RDW: 13.2 % (ref 11.5–15.5)
WBC: 7 10*3/uL (ref 4.0–10.5)
nRBC: 0 % (ref 0.0–0.2)

## 2023-04-02 LAB — COMPREHENSIVE METABOLIC PANEL
ALT: 16 U/L (ref 0–44)
AST: 16 U/L (ref 15–41)
Albumin: 3.5 g/dL (ref 3.5–5.0)
Alkaline Phosphatase: 53 U/L (ref 38–126)
Anion gap: 12 (ref 5–15)
BUN: 15 mg/dL (ref 8–23)
CO2: 25 mmol/L (ref 22–32)
Calcium: 9.4 mg/dL (ref 8.9–10.3)
Chloride: 99 mmol/L (ref 98–111)
Creatinine, Ser: 1.28 mg/dL — ABNORMAL HIGH (ref 0.61–1.24)
GFR, Estimated: 55 mL/min — ABNORMAL LOW (ref >60)
Glucose, Bld: 105 mg/dL — ABNORMAL HIGH (ref 70–99)
Potassium: 4 mmol/L (ref 3.5–5.1)
Sodium: 136 mmol/L (ref 135–145)
Total Bilirubin: 0.7 mg/dL (ref 0.3–1.2)
Total Protein: 6.4 g/dL — ABNORMAL LOW (ref 6.5–8.1)

## 2023-04-02 LAB — SALICYLATE LEVEL: Salicylate Lvl: <7 mg/dL — ABNORMAL LOW (ref 7.0–30.0)

## 2023-04-02 LAB — RAPID URINE DRUG SCREEN, HOSP PERFORMED
Amphetamines: NOT DETECTED
Barbiturates: NOT DETECTED
Benzodiazepines: NOT DETECTED
Cocaine: NOT DETECTED
Opiates: NOT DETECTED
Tetrahydrocannabinol: NOT DETECTED

## 2023-04-02 LAB — URINALYSIS, ROUTINE W REFLEX MICROSCOPIC
Bacteria, UA: NONE SEEN
Bilirubin Urine: NEGATIVE
Glucose, UA: NEGATIVE mg/dL
Ketones, ur: NEGATIVE mg/dL
Leukocytes,Ua: NEGATIVE
Nitrite: NEGATIVE
Protein, ur: NEGATIVE mg/dL
Specific Gravity, Urine: 1.014 (ref 1.005–1.030)
pH: 5 (ref 5.0–8.0)

## 2023-04-02 LAB — AMMONIA: Ammonia: <10 umol/L (ref 9–35)

## 2023-04-02 LAB — ACETAMINOPHEN LEVEL: Acetaminophen (Tylenol), Serum: <10 ug/mL — ABNORMAL LOW (ref 10–30)

## 2023-04-02 LAB — RESP PANEL BY RT-PCR (RSV, FLU A&B, COVID)  RVPGX2
Influenza A by PCR: NEGATIVE
Influenza B by PCR: NEGATIVE
Resp Syncytial Virus by PCR: NEGATIVE
SARS Coronavirus 2 by RT PCR: NEGATIVE

## 2023-04-02 LAB — ETHANOL: Alcohol, Ethyl (B): <10 mg/dL (ref <10)

## 2023-04-02 MED ORDER — SODIUM CHLORIDE 0.9 % IV BOLUS
1000.0000 mL | Freq: Once | INTRAVENOUS | Status: AC
  Administered 2023-04-02: 1000 mL via INTRAVENOUS

## 2023-04-02 NOTE — Telephone Encounter (Signed)
Tried calling Pt daughter twice but no answer.

## 2023-04-02 NOTE — ED Provider Notes (Signed)
[place] EMERGENCY DEPARTMENT AT [facility] Provider Note   CSN: [id number] Arrival date & time: 04/02/23  1110     History  Chief Complaint  Patient presents with   Hallucinations   Fatigue   Dizziness   Fall    Jason Farmer is a 88 y.o. male.  The history is provided by the patient, a relative and medical records. No language interpreter was used.  Dizziness Fall     88 year old male significant history of hepatitis, migraine, TIA, hyperlipidemia, hard of hearing, anxiety brought in by family member with concerns of altered mental status.  History obtained through patient and through family member who is at bedside.  For the past 1 to 2 weeks patient states he does not feel well.  States that he is having some trouble sleeping, having bouts of wooziness, and feeling fatigued, having some sinus congestion as well as having noticed some hallucination especially at nighttime.  States that at time he would see figure sitting next to him and he would have conversation and then when he turned around he realized he was talking to a pillow.  He mention being seen by his PCP for this complaint little over a week ago.  He was told that he has protein in his urine and he was started on some antibiotic.  Due to the medication but noticed no significant improvement.  Last week he was helping his wife walk when she lost balance and a wheelchair struck his legs causing some bruising but he denies any subsequent pain from the incident.  He also admits that he have not been eating quite as usual because he does not have much of an appetite.  He does not endorse any fever or chills no ringing in the ear, no severe headache chest pain trouble breathing abdominal pain vomiting diarrhea dysuria.  Recent medication change include Eliquis for his A-fib.  Home Medications Prior to Admission medications   Medication Sig Start Date End Date Taking? Authorizing Provider  acetaminophen (TYLENOL)  325 MG tablet Take 325 mg by mouth every 6 (six) hours as needed for moderate pain or headache.    [provider]  azelastine (ASTELIN) 0.1 % nasal spray Place 2 sprays into both nostrils 3 times/day as needed-between meals & bedtime for allergies. Use in each nostril as directed    [provider]  Cholecalciferol (VITAMIN D3) 125 MCG (5000 UT) CAPS Take 5,000 Units by mouth daily.    [provider]  ELIQUIS 5 MG TABS tablet Take 1 tablet (5 mg total) by mouth 2 (two) times daily. 03/04/23   Nafziger, Kandee Keen, NP  escitalopram (LEXAPRO) 10 MG tablet TAKE ONE TABLET BY MOUTH ONCE DAILY 03/04/23   Nafziger, Kandee Keen, NP  ezetimibe (ZETIA) 10 MG tablet Take 1 tablet (10 mg total) by mouth daily. 01/28/23   Nafziger, Kandee Keen, NP  latanoprost (XALATAN) 0.005 % ophthalmic solution Place 1 drop into both eyes at bedtime.     [provider]  metoprolol tartrate (LOPRESSOR) 50 MG tablet Take 1 tablet (50 mg total) by mouth 2 (two) times daily. 03/04/23 04/03/23  Nafziger, Kandee Keen, NP  mirtazapine (REMERON) 15 MG tablet TAKE ONE TABLET BY MOUTH AT BEDTIME 01/28/23   Nafziger, Kandee Keen, NP  omeprazole (PRILOSEC) 20 MG capsule TAKE ONE CAPSULE BY MOUTH ONCE DAILY 03/04/23   Nafziger, Kandee Keen, NP  ondansetron (ZOFRAN) 4 MG tablet Take 1 tablet (4 mg total) by mouth every 8 (eight) hours as needed for nausea  or vomiting. 04/01/23   Nafziger, Kandee Keen, NP  simvastatin (ZOCOR) 10 MG tablet Take 1 tablet (10 mg total) by mouth every other day. 12/03/22   Nafziger, Kandee Keen, NP      Allergies    Statins and Aspirin    Review of Systems   Review of Systems  Neurological:  Positive for dizziness.  All other systems reviewed and are negative.   Physical Exam Updated Vital Signs BP (!) 153/71 (BP Location: Right Arm)   Pulse (!) 59   Temp 97.8 F (36.6 C) (Oral)   Resp 16   SpO2 97%  Physical Exam Vitals and nursing note reviewed.  Constitutional:      General: He is not in acute distress.    Appearance:  He is well-developed.  HENT:     Head: Normocephalic and atraumatic.  Eyes:     Extraocular Movements: Extraocular movements intact.     Conjunctiva/sclera: Conjunctivae normal.     Pupils: Pupils are equal, round, and reactive to light.  Cardiovascular:     Rate and Rhythm: Normal rate and regular rhythm.     Pulses: Normal pulses.     Heart sounds: Normal heart sounds.  Pulmonary:     Effort: Pulmonary effort is normal.     Breath sounds: Normal breath sounds.  Abdominal:     Palpations: Abdomen is soft.     Tenderness: There is no abdominal tenderness.  Musculoskeletal:     Cervical back: Neck supple.  Skin:    Findings: No rash.  Neurological:     Mental Status: He is alert.     Comments: Neurologic exam:  Speech clear, pupils equal round reactive to light, extraocular movements intact  Normal peripheral visual fields Cranial nerves III through XII normal including no facial droop Follows commands, moves all extremities x4, normal strength to bilateral upper and lower extremities at all major muscle groups including grip Sensation normal to light touch  Coordination intact, no limb ataxia, finger-nose-finger normal Rapid alternating movements normal No pronator drift Gait normal  Unable to perform 3 word recall after 5 minutes.      ED Results / Procedures / Treatments   Labs (all labs ordered are listed, but only abnormal results are displayed) Labs Reviewed  COMPREHENSIVE METABOLIC PANEL - Abnormal; Notable for the following components:      Result Value   Glucose, Bld 105 (*)    Creatinine, Ser 1.28 (*)    Total Protein 6.4 (*)    GFR, Estimated 55 (*)    All other components within normal limits  SALICYLATE LEVEL - Abnormal; Notable for the following components:   Salicylate Lvl <7.0 (*)    All other components within normal limits  ACETAMINOPHEN LEVEL - Abnormal; Notable for the following components:   Acetaminophen (Tylenol), Serum <10 (*)    All  other components within normal limits  URINALYSIS, ROUTINE W REFLEX MICROSCOPIC - Abnormal; Notable for the following components:   Hgb urine dipstick MODERATE (*)    All other components within normal limits  RESP PANEL BY RT-PCR (RSV, FLU A&B, COVID)  RVPGX2  ETHANOL  CBC  RAPID URINE DRUG SCREEN, HOSP PERFORMED  AMMONIA  CBG MONITORING, ED    EKG None  Radiology CT Head Wo Contrast  Result Date: 04/02/2023 CLINICAL DATA:  Altered mental status, delirium, recent fall EXAM: CT HEAD WITHOUT CONTRAST TECHNIQUE: Contiguous axial images were obtained from the base of the skull through the vertex without intravenous contrast. RADIATION DOSE REDUCTION:  This exam was performed according to the departmental dose-optimization program which includes automated exposure control, adjustment of the mA and/or kV according to patient size and/or use of iterative reconstruction technique. COMPARISON:  04/01/2021 FINDINGS: Brain: No acute intracranial findings are seen in noncontrast CT brain. There are no signs of bleeding within the cranium. Cortical sulci are prominent. There is no focal edema or mass effect. Vascular: Unremarkable. Skull: No fracture is seen in calvarium. Sinuses/Orbits: Unremarkable. Other: None. IMPRESSION: No acute intracranial findings are seen in noncontrast CT brain. Atrophy. Electronically Signed   By: Ernie Avena M.D.   On: 04/02/2023 15:08    Procedures Procedures    Medications Ordered in ED Medications  sodium chloride 0.9 % bolus 1,000 mL (1,000 mLs Intravenous New Bag/Given 04/02/23 1505)    ED Course/ Medical Decision Making/ A&P                                 Medical Decision Making Amount and/or Complexity of Data Reviewed Labs: ordered. Radiology: ordered.   BP (!) 153/71 (BP Location: Right Arm)   Pulse (!) 59   Temp 97.8 F (36.6 C) (Oral)   Resp 16   SpO2 97%   1:60 PM  88 year old male significant history of hepatitis, migraine, TIA,  hyperlipidemia, hard of hearing, anxiety brought in by family member with concerns of altered mental status.  History obtained through patient and through family member who is at bedside.  For the past 1 to 2 weeks patient states he does not feel well.  States that he is having some trouble sleeping, having bouts of wooziness, and feeling fatigued, having some sinus congestion as well as having noticed some hallucination especially at nighttime.  States that at time he would see figure sitting next to him and he would have conversation and then when he turned around he realized he was talking to a pillow.  He mention being seen by his PCP for this complaint little over a week ago.  He was told that he has protein in his urine and he was started on some antibiotic.  Due to the medication but noticed no significant improvement.  Last week he was helping his wife walk when she lost balance and a wheelchair struck his legs causing some bruising but he denies any subsequent pain from the incident.  He also admits that he have not been eating quite as usual because he does not have much of an appetite.  He does not endorse any fever or chills no ringing in the ear, no severe headache chest pain trouble breathing abdominal pain vomiting diarrhea dysuria.  Recent medication change include Eliquis for his A-fib.  On exam this is an alert well-appearing elderly male appears to be in no acute discomfort.  He does not have any focal neurodeficit on exam.  He has normal gait.  Heart with normal rate and rhythm, lungs clear to auscultation bilaterally abdomen soft nontender he does have several scrapes to his bilateral lower extremities with dressing in place and no evidence of infection.  He does not have any visible nystagmus.  -Labs ordered, independently viewed and interpreted by me.  Labs remarkable for Hgb showing moderate Hgb without signs of UTI.  No protein in urine.  Cr. 1.28 similar to baseline. UDS negative -The  patient was maintained on a cardiac monitor.  I personally viewed and interpreted the cardiac monitored which showed an underlying  rhythm of: sinus bradycardia -Imaging independently viewed and interpreted by me and I agree with radiologist's interpretation.  Result remarkable for head CT without acute finding -This patient presents to the ED for concern of hallucination, this involves an extensive number of treatment options, and is a complaint that carries with it a high risk of complications and morbidity.  The differential diagnosis includes delirium, electrolytes imbalance, stroke, depression, hepatic encephalopathy -Co morbidities that complicate the patient evaluation includes migraine, TIA, anxiety, GERD -Treatment includes IVF -Reevaluation of the patient after these medicines showed that the patient improved -PCP office notes or outside notes reviewed -Discussion with specialist attending Dr. Renaye Rakers.  Pt sign out to oncoming provider who will f/u on labs, reassess pt and determine disposition -Escalation to admission/observation considered: dispo pending         Final Clinical Impression(s) / ED Diagnoses Final diagnoses:  None    Rx / DC Orders ED Discharge Orders     None         Fayrene Helper, PA-C 04/02/23 1517    Terald Sleeper, MD 04/02/23 [id number]

## 2023-04-02 NOTE — Telephone Encounter (Signed)
Caller was requesting another call from care team with more clarification as to why he was directed to ED.

## 2023-04-02 NOTE — ED Notes (Signed)
Patient left without vital signs being done.

## 2023-04-02 NOTE — Telephone Encounter (Signed)
This was taking care of verbally with front office staff.

## 2023-04-02 NOTE — ED Triage Notes (Addendum)
Pt/daughter stated, Jason Farmer been seeing and talking to people for about a week and half. He has being tired and not eating like normal.He fell last Friday over his wife helping her and has scrapes and abrasions to lower legs . He has been treated for Protein in his urine.

## 2023-04-02 NOTE — ED Provider Notes (Signed)
Care assumed from Fayrene Helper, PA-C at shift change.  Please see their note for further information.  HPI:   88 year old male significant history of hepatitis, migraine, TIA, hyperlipidemia, hard of hearing, anxiety brought in by family member with concerns of altered mental status.  History obtained through patient and through family member who is at bedside.  For the past 1 to 2 weeks patient states he does not feel well.  States that he is having some trouble sleeping, having bouts of wooziness, and feeling fatigued, having some sinus congestion as well as having noticed some hallucination especially at nighttime.  States that at time he would see figure sitting next to him and he would have conversation and then when he turned around he realized he was talking to a pillow.  He mention being seen by his PCP for this complaint little over a week ago.  He was told that he has protein in his urine and he was started on some antibiotic.  Due to the medication but noticed no significant improvement.  Last week he was helping his wife walk when she lost balance and a wheelchair struck his legs causing some bruising but he denies any subsequent pain from the incident.  He also admits that he have not been eating quite as usual because he does not have much of an appetite.  He does not endorse any fever or chills no ringing in the ear, no severe headache chest pain trouble breathing abdominal pain vomiting diarrhea dysuria.  Recent medication change include Eliquis for his A-fib.    Physical Exam  BP (!) 153/71 (BP Location: Right Arm)   Pulse (!) 59   Temp 97.8 F (36.6 C) (Oral)   Resp 16   SpO2 97%   Physical Exam Vitals and nursing note reviewed.  Constitutional:      General: He is not in acute distress.    Appearance: Normal appearance. He is normal weight. He is not ill-appearing, toxic-appearing or diaphoretic.  HENT:     Head: Normocephalic and atraumatic.  Cardiovascular:     Rate and  Rhythm: Normal rate.  Pulmonary:     Effort: Pulmonary effort is normal. No respiratory distress.  Abdominal:     General: Abdomen is flat.     Palpations: Abdomen is soft.  Musculoskeletal:        General: Normal range of motion.     Cervical back: Normal range of motion.  Skin:    General: Skin is warm and dry.  Neurological:     General: No focal deficit present.     Mental Status: He is alert and oriented to person, place, and time.     GCS: GCS eye subscore is 4. GCS verbal subscore is 5. GCS motor subscore is 6.     Sensory: Sensation is intact.     Motor: Motor function is intact.     Coordination: Coordination is intact.     Gait: Gait is intact.     Comments: Alert and oriented to self, place, time and event.    Speech is fluent, clear without dysarthria or dysphasia.    Strength 5/5 in upper/lower extremities   Sensation intact in upper/lower extremities    CN I not tested  CN II grossly intact visual fields bilaterally. Did not visualize posterior eye.  CN III, IV, VI PERRLA and EOMs intact bilaterally  CN V Intact sensation to sharp and light touch to the face  CN VII facial movements symmetric  CN VIII not tested  CN IX, X no uvula deviation, symmetric rise of soft palate  CN XI 5/5 SCM and trapezius strength bilaterally  CN XII Midline tongue protrusion, symmetric L/R movements   Psychiatric:        Mood and Affect: Mood normal.        Behavior: Behavior normal.     Procedures  Procedures  ED Course / MDM    Medical Decision Making Amount and/or Complexity of Data Reviewed Labs: ordered. Radiology: ordered.   Ammonia level and respiratory panel pending at shift change and will determine dispo.  If normal, shared decision making if TTS consult is indicated.  Suspect patient will be able to go home.  5:01PM:   Ammonia level resulted and is WNL.  COVID-negative.  Upon reevaluation of patient, he is alert and oriented and neurologically intact without  focal deficits.  He does note to me that he has been in her intense stress recently with his wife recently moving to assisted living from independent living where he is. Wife also had a significant fall recently.  States he has not been sleeping well recently and last night was the first time he has ever hallucinated and it was in the night.  Upon further discussion he is unsure if he was awake or not during this time. He has not had any additional hallucinations today.  His workup is benign.  He discussed with patient and daughter at bedside who are understanding of this.  I did offer a TTS consult, however they would prefer to go home and follow-up with her primary doctor. Evaluation and diagnostic testing in the emergency department does not suggest an emergent condition requiring admission or immediate intervention beyond what has been performed at this time.  Plan for discharge with close PCP follow-up.  Patient is understanding and amenable with plan, educated on red flag symptoms that would prompt immediate return.  Patient discharged in stable condition.      Vear Clock 04/02/23 1706    Rondel Baton, MD 04/03/23 [phone]

## 2023-04-02 NOTE — Telephone Encounter (Signed)
Pt's step daughter on the line asking for info as to why the patient was directed by the office to go to the ED. No notes in chart indicate why. Went and spoke with CMA who states he was sent because he was c/o dizziness, hallucinations. Caller has more questions. She can be reached at [phone]

## 2023-04-02 NOTE — Discharge Instructions (Addendum)
As we discussed, your workup in the ER today was reassuring for acute findings.  Laboratory evaluation and CT imaging did not did not reveal any emergent concerns.  I suspect that stress could be playing into your symptoms as well as dehydration and your insomnia.  I recommend that you discuss definitive management of this with your primary care doctor.  Please call to schedule follow-up appointment at your earliest convenience.  Return if development of any new or worsening symptoms.

## 2023-04-03 ENCOUNTER — Telehealth: Payer: Self-pay | Admitting: Adult Health

## 2023-04-03 NOTE — Telephone Encounter (Signed)
Pt daughter Byrd Hesselbach is calling and pt went to ER for dizziness and er said the next step is for pt to see a neurologist

## 2023-04-03 NOTE — Telephone Encounter (Signed)
Jason Farmer stated pt memory is declining and he is becoming agitated.

## 2023-04-03 NOTE — Telephone Encounter (Signed)
[place],Jason Farmer notified of message below. Byrd Hesselbach stated that the PA at the hospital thinking pt should see phys. Due to depression.

## 2023-04-06 ENCOUNTER — Other Ambulatory Visit: Payer: Self-pay | Admitting: Adult Health

## 2023-04-06 ENCOUNTER — Telehealth: Payer: Self-pay

## 2023-04-06 DIAGNOSIS — I10 Essential (primary) hypertension: Secondary | ICD-10-CM

## 2023-04-06 DIAGNOSIS — K219 Gastro-esophageal reflux disease without esophagitis: Secondary | ICD-10-CM

## 2023-04-06 NOTE — Transitions of Care (Post Inpatient/ED Visit) (Signed)
[id number]  04/06/2023  Name: Jason Farmer MRN: [id number] DOB: 12/15/1934  Today's TOC FU Call Status: Today's TOC FU Call Status:: Successful TOC FU Call Completed TOC FU Call Complete Date: 04/06/23 (Call completed with daughter-Maria)  Red on EMMI-ED Discharge Alert Date & Reason:04/04/23 "Scheduled follow-up appt? No" "Have d/c instructions? No"   Transition Care Management Follow-up Telephone Call Date of Discharge: 04/02/23 Discharge Facility: [facility]) Type of Discharge: Emergency Department Reason for ED Visit: Other: ("other fatigue") How have you been since you were released from the hospital?: Same (Daughter voices that pt doing about the same-still "feeling pretty rotten") Any questions or concerns?: No  Items Reviewed: Did you receive and understand the discharge instructions provided?: Yes Medications obtained,verified, and reconciled?: No Medications Not Reviewed Reasons:: Other: (daughter not with pt and does not have meds) Any new allergies since your discharge?: No Dietary orders reviewed?: NA Do you have support at home?: Yes People in Home: alone Name of Support/Comfort Primary Source: pt lives at ILF daughters checks on pt and assists as needed  Medications Reviewed Today: Medications Reviewed Today     Reviewed by Charlyn Minerva, RN (Registered Nurse) on 04/06/23 at 1316  Med List Status: <None>   Medication Order Taking? Sig Documenting Provider Last Dose Status Informant  acetaminophen (TYLENOL) 325 MG tablet [id number] No Take 325 mg by mouth every 6 (six) hours as needed for moderate pain or headache. [provider] unknown Active Self, Family Member  azelastine (ASTELIN) 0.1 % nasal spray [id number] No Place 2 sprays into both nostrils 3 times/day as needed-between meals & bedtime for allergies. Use in each nostril as directed [provider] 04/01/2023 Active Self, Family Member  Cholecalciferol (VITAMIN D3) 125 MCG  (5000 UT) CAPS [id number] No Take 5,000 Units by mouth daily. [provider] 04/01/2023 Active Self, Family Member  ELIQUIS 5 MG TABS tablet [id number] No Take 1 tablet (5 mg total) by mouth 2 (two) times daily. [name], NP 04/01/2023 1830 Active Self, Family Member  escitalopram (LEXAPRO) 10 MG tablet [id number] No TAKE ONE TABLET BY MOUTH ONCE DAILY Nafziger, Cory, NP 04/01/2023 Active Self, Family Member  ezetimibe (ZETIA) 10 MG tablet [id number] No Take 1 tablet (10 mg total) by mouth daily. [name], NP 04/01/2023 Active Self, Family Member  latanoprost (XALATAN) 0.005 % ophthalmic solution [id number] No Place 1 drop into both eyes at bedtime.  [provider] 04/01/2023 Active Self, Family Member  metoprolol tartrate (LOPRESSOR) 50 MG tablet [id number] No Take 1 tablet (50 mg total) by mouth 2 (two) times daily. [name], NP 04/01/2023 Expired 04/03/23 2359 Self, Family Member  mirtazapine (REMERON) 15 MG tablet [id number] No TAKE ONE TABLET BY MOUTH AT BEDTIME  Patient taking differently: Take 30 mg by mouth at bedtime.   [name], NP 04/01/2023 Active Self, Family Member  omeprazole (PRILOSEC) 20 MG capsule [id number] No TAKE ONE CAPSULE BY MOUTH ONCE DAILY Nafziger, Cory, NP 04/01/2023 Active Self, Family Member  ondansetron (ZOFRAN) 4 MG tablet [id number] No Take 1 tablet (4 mg total) by mouth every 8 (eight) hours as needed for nausea or vomiting.  Patient not taking: Reported on 04/02/2023   [name], NP Not Taking Active Self, Family Member           Med Note [facility] Alinda Dooms A   Thu Apr 02, 2023  3:22 PM) Patient has not picked it up from pharmacy yet.  simvastatin (ZOCOR) 10 MG tablet [id number] No Take 1  tablet (10 mg total) by mouth every other day. Nafziger, Kandee Keen, NP unknown Active Self, Family Member  Med List Note Jean Rosenthal, [place] 11/27/16 1744): [facility] [place]            Home Care and Equipment/Supplies: Were Home Health Services  Ordered?: NA Any new equipment or medical supplies ordered?: NA  Functional Questionnaire: Do you need assistance with bathing/showering or dressing?: No Do you need assistance with meal preparation?: No Do you need assistance with eating?: No Do you have difficulty maintaining continence: No Do you need assistance with getting out of bed/getting out of a chair/moving?: No Do you have difficulty managing or taking your medications?: No  Follow up appointments reviewed: PCP Follow-up appointment confirmed?: No (Daughter will call on her own and make an appt) MD Provider Line Number:[phone] Given: No Specialist Hospital Follow-up appointment confirmed?: No Reason Specialist Follow-Up Not Confirmed: Patient has Specialist Provider Number and will Call for Appointment (daughter confrims she is calling [facility] today to make an appt) Do you need transportation to your follow-up appointment?: No Do you understand care options if your condition(s) worsen?: Yes-patient verbalized understanding  SDOH Interventions Today    Flowsheet Row Most Recent Value  SDOH Interventions   Food Insecurity Interventions Intervention Not Indicated  Transportation Interventions Intervention Not Indicated      TOC Interventions Today    Flowsheet Row Most Recent Value  TOC Interventions   TOC Interventions Discussed/Reviewed TOC Interventions Discussed      Interventions Today    Flowsheet Row Most Recent Value  General Interventions   General Interventions Discussed/Reviewed General Interventions Discussed, Doctor Visits  Doctor Visits Discussed/Reviewed Specialist, PCP, Doctor Visits Discussed  PCP/Specialist Visits Compliance with follow-up visit  Education Interventions   Education Provided Provided Education  Provided Verbal Education On When to see the doctor, Medication, Nutrition  Mental Health Interventions   Mental Health Discussed/Reviewed Mental Health Discussed,  Coping Strategies  [daughter admits pt dealing with a lot of stressors-doesnt think pt will be willing to discuss/talk with anyone or receive tx]  Pharmacy Interventions   Pharmacy Dicussed/Reviewed Pharmacy Topics Discussed  Safety Interventions   Safety Discussed/Reviewed Safety Discussed       Alessandra Grout [facility] Care Management Care Management Community Coordinator Direct Phone: [phone] Toll Free: [phone] Fax: [phone]

## 2023-04-07 ENCOUNTER — Other Ambulatory Visit: Payer: Self-pay | Admitting: Adult Health

## 2023-04-07 ENCOUNTER — Ambulatory Visit: Payer: Self-pay

## 2023-04-07 NOTE — Telephone Encounter (Signed)
  The original prescription was discontinued on 11/13/2022 by Dorcas Carrow, MD for the following reason: Stop Taking at Discharge. Renewing this prescription may not be appropriate.

## 2023-04-07 NOTE — Telephone Encounter (Signed)
Spoke to Center and she stated pt taking Remron and lexapro. She stated pt is not using Celexa. Byrd Hesselbach stated that if pt medication will change she wants to know if it can be done gradually due to pt being sensitive to medication.

## 2023-04-07 NOTE — Chronic Care Management (AMB) (Signed)
   04/07/2023  Jason Farmer 01-Feb-1935 [id number]   Reason for Encounter: Patient is not currently enrolled in the CCM program. CCM enrollment status changed to previously enrolled.   [facility]Care Management [phone]

## 2023-04-08 NOTE — Telephone Encounter (Signed)
Spoke to pt daughtr Byrd Hesselbach and she stated they have not picked up the medication. Byrd Hesselbach informed me that pt has a lot of the 10s left so she will double it. Byrd Hesselbach want to try to add the donepezil back but start him on the 2.5 to cut back on diarrhea.

## 2023-04-08 NOTE — Progress Notes (Signed)
Resp panel ordered for evaluation of altered mental status

## 2023-04-09 NOTE — Telephone Encounter (Signed)
Left message to return phone call.

## 2023-04-09 NOTE — Telephone Encounter (Signed)
Patient notified of update  and verbalized understanding. 

## 2023-04-16 ENCOUNTER — Ambulatory Visit: Payer: Medicare Other | Admitting: Internal Medicine

## 2023-04-16 ENCOUNTER — Encounter: Payer: Self-pay | Admitting: Internal Medicine

## 2023-04-16 VITALS — BP 130/78 | HR 102 | Temp 98.3°F | Resp 20 | Ht 68.0 in | Wt 145.0 lb

## 2023-04-16 DIAGNOSIS — R42 Dizziness and giddiness: Secondary | ICD-10-CM | POA: Insufficient documentation

## 2023-04-16 DIAGNOSIS — Z6822 Body mass index (BMI) 22.0-22.9, adult: Secondary | ICD-10-CM | POA: Diagnosis not present

## 2023-04-16 DIAGNOSIS — I480 Paroxysmal atrial fibrillation: Secondary | ICD-10-CM | POA: Diagnosis not present

## 2023-04-16 DIAGNOSIS — R3121 Asymptomatic microscopic hematuria: Secondary | ICD-10-CM

## 2023-04-16 LAB — POCT URINALYSIS DIPSTICK
Bilirubin, UA: NEGATIVE
Blood, UA: POSITIVE
Glucose, UA: NEGATIVE
Ketones, UA: NEGATIVE
Leukocytes, UA: NEGATIVE
Nitrite, UA: NEGATIVE
Protein, UA: NEGATIVE
Spec Grav, UA: 1.01 (ref 1.010–1.025)
Urobilinogen, UA: 0.2 E.U./dL
pH, UA: 5 (ref 5.0–8.0)

## 2023-04-16 NOTE — Assessment & Plan Note (Signed)
He has symptoms of nausea, dizziness and fatigue and had some visual hallucnations but these are all resolved.  I reviewed his labs and notes from the ER and his previous PCP.

## 2023-04-16 NOTE — Assessment & Plan Note (Signed)
He has paroxysmal A. Fib where he seems to be in sinus rhythm today.  We will continue him on metoprolol and eliquis.  He has a followup appointment with cardiology on 05/01/2023.

## 2023-04-16 NOTE — Progress Notes (Signed)
Office Visit  Subjective   Patient ID: Jason Farmer   DOB: [birth year]   Age: 88 y.o.   MRN: [id number]   Chief Complaint Chief Complaint  Patient presents with   New Patient (Initial Visit)    New patient to establish.     History of Present Illness Jason Farmer is a 88 yo male who comes in today to establish care.  He just into [facility] in 10/2022 where he is in independent living and his wife is in the memory care unit.    The patient was previously followed by [name], NP where he last saw him on 04/01/2023 where he had been having problems with nausea, dizziness and fatigue.  He had seen the ER as well as his primary office with these complaints for about a month.  However, he states they now have resolved.  He reported and it was noted he had some visual hallucinations but these have not recurred.  He had moved in and [place] had multiple cases of COVID-19 at that time but the patient was tested multiple times and was negative.  Today, he states his appetite is doing ok.  However, he tells me that since being at [facility], he has gained 15 lbs.  On review of his ER and office visits, they have noted that he has had microscopic hematuria on multiple UA analyses.  He saw his primary care office in 02/2023 and they initially started him on some bactrim.  The UA showed no LE or nitrities but just protein and blood.  He had repeat analysis done and this showed continued blood.  He sees no gross hematuria.  He denies any dysuria, hematuria, frequency, urgency, abdominal pain, nausea, vomiting or fevers/chills.  The patient has a prior history of smoking for about 10 years and quit at age 58.  He is on eliquis for A. Fib.  The patient was recently diagnosed with Atrial Fib where he was hospitalized at [facility] from 11/12/2022 until 11/13/2022.  He has a history of dysphagia and esophageal stricture where he was doing preop and getting ready for anesthesia for  esophageal dilatation with upper GI endoscopy and was found to have tachycardia with fluctuating heart rate.  This was determined to be atrial fibrillation.  Due to new diagnosis of arrhythmia, the patient was transferred to ER.  His only complaints were some dizziness 4 days prior.  He had no earlier history of atrial fibrillation.  They found he had asymptomatic rapid A-fib and was initiated on Lopressor 50 mg twice daily, Cardizem drip, and started on Eliquis.  He was seen by cardiology.  Overnight, he converted to normal sinus rhythm and maintained normal sinus rhythm during his hospital stay.  They performed an ECHO on him on 11/13/2022 and this showed a normal LV function with a LVEF of 55-60% without regional wall motion abnormalities. He had grade II diastolic dysfunction.  His right ventricular systolic function was normal. The right ventricular size is mildly enlarged. There was mild bilateral atrial dilatation.   He had moderate TR.  The patient today denies any chest pain, palpitations, dizziness, syncope, edema, or other problems.         Past Medical History Past Medical History:  Diagnosis Date   Allergy    Anxiety    "not a problem for a long time" (11/27/2016)   Arthritis    "wrists, knees, shoulders, back" (11/27/2016)   Chronic lower back pain    "5th  disc is protruding" (11/27/2016)   Essential tremor    GERD (gastroesophageal reflux disease)    Glaucoma    Heart murmur    dx'd in [employer] in the 1960s; haven't been seen for it since "(11/27/2016)   Hepatitis ~ 1958   "jaundice kind"   HOH (hard of hearing)    Hyperlipemia    Migraine    "none since my neck OR" (11/27/2016)   Seasonal allergies    TIA (transient ischemic attack) 04/2015   Wears glasses    Wears hearing aid      Allergies Allergies  Allergen Reactions   Statins Other (See Comments)    Muscle cramps    Aspirin Other (See Comments)    bleeding     Medications  Current Outpatient Medications:     acetaminophen (TYLENOL) 325 MG tablet, Take 325 mg by mouth every 6 (six) hours as needed for moderate pain or headache., Disp: , Rfl:    azelastine (ASTELIN) 0.1 % nasal spray, Place 2 sprays into both nostrils 3 times/day as needed-between meals & bedtime for allergies. Use in each nostril as directed, Disp: , Rfl:    Cholecalciferol (VITAMIN D3) 125 MCG (5000 UT) CAPS, Take 5,000 Units by mouth daily., Disp: , Rfl:    ELIQUIS 5 MG TABS tablet, Take 1 tablet (5 mg total) by mouth 2 (two) times daily., Disp: 60 tablet, Rfl: 0   escitalopram (LEXAPRO) 10 MG tablet, TAKE ONE TABLET BY MOUTH ONCE DAILY, Disp: 30 tablet, Rfl: 0   ezetimibe (ZETIA) 10 MG tablet, Take 1 tablet (10 mg total) by mouth daily., Disp: 30 tablet, Rfl: 0   latanoprost (XALATAN) 0.005 % ophthalmic solution, Place 1 drop into both eyes at bedtime. , Disp: , Rfl:    metoprolol tartrate (LOPRESSOR) 50 MG tablet, Take 1 tablet (50 mg total) by mouth 2 (two) times daily., Disp: 60 tablet, Rfl: 0   omeprazole (PRILOSEC) 20 MG capsule, TAKE ONE CAPSULE BY MOUTH ONCE DAILY, Disp: 30 capsule, Rfl: 0   ondansetron (ZOFRAN) 4 MG tablet, Take 1 tablet (4 mg total) by mouth every 8 (eight) hours as needed for nausea or vomiting., Disp: 20 tablet, Rfl: 0   simvastatin (ZOCOR) 10 MG tablet, Take 1 tablet (10 mg total) by mouth every other day., Disp: 90 tablet, Rfl: 0   Review of Systems Review of Systems  Constitutional:  Negative for chills, fever, malaise/fatigue and weight loss.  Eyes:  Negative for blurred vision and double vision.  Respiratory:  Negative for cough, hemoptysis and shortness of breath.   Cardiovascular:  Negative for chest pain, palpitations and leg swelling.  Gastrointestinal:  Negative for abdominal pain, blood in stool, constipation, diarrhea, heartburn, melena, nausea and vomiting.  Genitourinary:  Negative for dysuria, flank pain, frequency, hematuria and urgency.  Musculoskeletal:  Negative for myalgias.  Skin:   Negative for itching and rash.  Neurological:  Negative for dizziness, weakness and headaches.  Endo/Heme/Allergies:  Negative for polydipsia.       Objective:    Vitals BP 130/78 (BP Location: Left Arm, Patient Position: Sitting, Cuff Size: Normal)   Pulse (!) 102   Temp 98.3 F (36.8 C)   Resp 20   Ht 5\' 8"  (1.727 m)   Wt 145 lb (65.8 kg)   SpO2 98%   BMI 22.05 kg/m    Physical Examination Physical Exam Constitutional:      Appearance: Normal appearance. He is not ill-appearing.  Cardiovascular:     Rate and  Rhythm: Normal rate and regular rhythm.     Pulses: Normal pulses.     Heart sounds: No murmur heard.    No friction rub. No gallop.  Pulmonary:     Effort: Pulmonary effort is normal. No respiratory distress.     Breath sounds: No wheezing, rhonchi or rales.  Abdominal:     General: Abdomen is flat. Bowel sounds are normal. There is no distension.     Palpations: Abdomen is soft.     Tenderness: There is no abdominal tenderness.  Musculoskeletal:     Right lower leg: No edema.     Left lower leg: No edema.  Skin:    General: Skin is warm and dry.     Findings: No rash.  Neurological:     General: No focal deficit present.     Mental Status: He is alert and oriented to person, place, and time.  Psychiatric:        Mood and Affect: Mood normal.        Behavior: Behavior normal.        Assessment & Plan:   Paroxysmal atrial fibrillation (HCC) He has paroxysmal A. Fib where he seems to be in sinus rhythm today.  We will continue him on metoprolol and eliquis.  He has a followup appointment with cardiology on 05/01/2023.  Dizziness He has symptoms of nausea, dizziness and fatigue and had some visual hallucnations but these are all resolved.  I reviewed his labs and notes from the ER and his previous PCP.  Hematuria We did a repeat UA today and this showed 3+ blood on his urinanalysis.  I am going to refer him over to urology at this time.    Return in  about 3 months (around 07/17/2023).   Crist Fat, MD

## 2023-04-16 NOTE — Assessment & Plan Note (Signed)
We did a repeat UA today and this showed 3+ blood on his urinanalysis.  I am going to refer him over to urology at this time.

## 2023-04-17 ENCOUNTER — Ambulatory Visit (INDEPENDENT_AMBULATORY_CARE_PROVIDER_SITE_OTHER): Payer: Medicare Other | Admitting: Physician Assistant

## 2023-04-17 ENCOUNTER — Encounter: Payer: Self-pay | Admitting: Physician Assistant

## 2023-04-17 VITALS — BP 140/70 | HR 78 | Resp 18 | Ht 68.0 in

## 2023-04-17 DIAGNOSIS — R413 Other amnesia: Secondary | ICD-10-CM

## 2023-04-17 MED ORDER — MEMANTINE HCL 5 MG PO TABS: ORAL_TABLET | ORAL | 11 refills | Status: DC
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
## 2023-04-17 NOTE — Patient Instructions (Addendum)
It was a pleasure to see you today at our office.   Recommendations:  Neurocognitive evaluation at our office   Start Memantine 5mg  tablets.  Take 1 tablet at bedtime for 2 weeks, then 1 tablet twice daily.    Follow up in 6  months   For psychiatric meds, mood meds: Please have your primary care physician manage these medications.  If you have any severe symptoms of a stroke, or other severe issues such as confusion,severe chills or fever, etc call 911 or go to the ER as you may need to be evaluated further   For assessment of decision of mental capacity and competency:  Call Dr. Erick Blinks, geriatric psychiatrist at [phone]  Counseling regarding caregiver distress, including caregiver depression, anxiety and issues regarding community resources, adult day care programs, adult living facilities, or memory care questions:  please contact your  Primary Doctor's Social Worker   Whom to call: Memory  decline, memory medications: Call our office [phone]    [email or web address]   RECOMMENDATIONS FOR ALL PATIENTS WITH MEMORY PROBLEMS: 1. Continue to exercise (Recommend 30 minutes of walking everyday, or 3 hours every week) 2. Increase social interactions - continue going to [place] and enjoy social gatherings with friends and family 3. Eat healthy, avoid fried foods and eat more fruits and vegetables 4. Maintain adequate blood pressure, blood sugar, and blood cholesterol level. Reducing the risk of stroke and cardiovascular disease also helps promoting better memory. 5. Avoid stressful situations. Live a simple life and avoid aggravations. Organize your time and prepare for the next day in anticipation. 6. Sleep well, avoid any interruptions of sleep and avoid any distractions in the bedroom that may interfere with adequate sleep quality 7. Avoid sugar, avoid sweets as there is a strong link between excessive sugar intake,  diabetes, and cognitive impairment We discussed the Mediterranean diet, which has been shown to help patients reduce the risk of progressive memory disorders and reduces cardiovascular risk. This includes eating fish, eat fruits and green leafy vegetables, nuts like almonds and hazelnuts, walnuts, and also use olive oil. Avoid fast foods and fried foods as much as possible. Avoid sweets and sugar as sugar use has been linked to worsening of memory function.  There is always a concern of gradual progression of memory problems. If this is the case, then we may need to adjust level of care according to patient needs. Support, both to the patient and caregiver, should then be put into place.      You have been referred for a neuropsychological evaluation (i.e., evaluation of memory and thinking abilities). Please bring someone with you to this appointment if possible, as it is helpful for the doctor to hear from both you and another adult who knows you well. Please bring eyeglasses and hearing aids if you wear them.    The evaluation will take approximately 3 hours and has two parts:   The first part is a clinical interview with the neuropsychologist (Dr. Milbert Coulter or Dr. Roseanne Reno). During the interview, the neuropsychologist will speak with you and the individual you brought to the appointment.    The second part of the evaluation is testing with the doctor's technician Annabelle Harman or Selena Batten). During the testing, the technician will ask you to remember different types of material, solve problems, and answer some questionnaires. Your family member will not be present for this portion of the evaluation.   Please note: We must reserve several hours of the neuropsychologist's time and the  psychometrician's time for your evaluation appointment. As such, there is a No-Show fee of $100. If you are unable to attend any of your appointments, please contact our office as soon as possible to reschedule.      DRIVING:  Regarding driving, in patients with progressive memory problems, driving will be impaired. We advise to have someone else do the driving if trouble finding directions or if minor accidents are reported. Independent driving assessment is available to determine safety of driving.   If you are interested in the driving assessment, you can contact the following:  [employer] in [place] [phone]  [facility] [phone]  [facility] [phone]  [facility] [phone] or [phone]   FALL PRECAUTIONS: Be cautious when walking. Scan the area for obstacles that may increase the risk of trips and falls. When getting up in the mornings, sit up at the edge of the bed for a few minutes before getting out of bed. Consider elevating the bed at the head end to avoid drop of blood pressure when getting up. Walk always in a well-lit room (use night lights in the walls). Avoid area rugs or power cords from appliances in the middle of the walkways. Use a walker or a cane if necessary and consider physical therapy for balance exercise. Get your eyesight checked regularly.  FINANCIAL OVERSIGHT: Supervision, especially oversight when making financial decisions or transactions is also recommended.  HOME SAFETY: Consider the safety of the kitchen when operating appliances like stoves, microwave oven, and blender. Consider having supervision and share cooking responsibilities until no longer able to participate in those. Accidents with firearms and other hazards in the house should be identified and addressed as well.   ABILITY TO BE LEFT ALONE: If patient is unable to contact 911 operator, consider using LifeLine, or when the need is there, arrange for someone to stay with patients. Smoking is a fire hazard, consider supervision or cessation. Risk of wandering should be assessed by caregiver and if detected at any point, supervision and safe proof  recommendations should be instituted.  MEDICATION SUPERVISION: Inability to self-administer medication needs to be constantly addressed. Implement a mechanism to ensure safe administration of the medications.      Mediterranean Diet A Mediterranean diet refers to food and lifestyle choices that are based on the traditions of countries located on the [employer]. This way of eating has been shown to help prevent certain conditions and improve outcomes for people who have chronic diseases, like kidney disease and heart disease. What are tips for following this plan? Lifestyle  Cook and eat meals together with your family, when possible. Drink enough fluid to keep your urine clear or pale yellow. Be physically active every day. This includes: Aerobic exercise like running or swimming. Leisure activities like gardening, walking, or housework. Get 7-8 hours of sleep each night. If recommended by your health care provider, drink red wine in moderation. This means 1 glass a day for nonpregnant women and 2 glasses a day for men. A glass of wine equals 5 oz (150 mL). Reading food labels  Check the serving size of packaged foods. For foods such as rice and pasta, the serving size refers to the amount of cooked product, not dry. Check the total fat in packaged foods. Avoid foods that have saturated fat or trans fats. Check the ingredients list for added sugars, such as corn syrup. Shopping  At the grocery store, buy most of your food from the areas  near the walls of the store. This includes: Fresh fruits and vegetables (produce). Grains, beans, nuts, and seeds. Some of these may be available in unpackaged forms or large amounts (in bulk). Fresh seafood. Poultry and eggs. Low-fat dairy products. Buy whole ingredients instead of prepackaged foods. Buy fresh fruits and vegetables in-season from local farmers markets. Buy frozen fruits and vegetables in resealable bags. If you do not have  access to quality fresh seafood, buy precooked frozen shrimp or canned fish, such as tuna, salmon, or sardines. Buy small amounts of raw or cooked vegetables, salads, or olives from the deli or salad bar at your store. Stock your pantry so you always have certain foods on hand, such as olive oil, canned tuna, canned tomatoes, rice, pasta, and beans. Cooking  Cook foods with extra-virgin olive oil instead of using butter or other vegetable oils. Have meat as a side dish, and have vegetables or grains as your main dish. This means having meat in small portions or adding small amounts of meat to foods like pasta or stew. Use beans or vegetables instead of meat in common dishes like chili or lasagna. Experiment with different cooking methods. Try roasting or broiling vegetables instead of steaming or sauteing them. Add frozen vegetables to soups, stews, pasta, or rice. Add nuts or seeds for added healthy fat at each meal. You can add these to yogurt, salads, or vegetable dishes. Marinate fish or vegetables using olive oil, lemon juice, garlic, and fresh herbs. Meal planning  Plan to eat 1 vegetarian meal one day each week. Try to work up to 2 vegetarian meals, if possible. Eat seafood 2 or more times a week. Have healthy snacks readily available, such as: Vegetable sticks with hummus. Greek yogurt. Fruit and nut trail mix. Eat balanced meals throughout the week. This includes: Fruit: 2-3 servings a day Vegetables: 4-5 servings a day Low-fat dairy: 2 servings a day Fish, poultry, or lean meat: 1 serving a day Beans and legumes: 2 or more servings a week Nuts and seeds: 1-2 servings a day Whole grains: 6-8 servings a day Extra-virgin olive oil: 3-4 servings a day Limit red meat and sweets to only a few servings a month What are my food choices? Mediterranean diet Recommended Grains: Whole-grain pasta. Brown rice. Bulgar wheat. Polenta. Couscous. Whole-wheat bread. Orpah Cobb. Vegetables: Artichokes. Beets. Broccoli. Cabbage. Carrots. Eggplant. Green beans. Chard. Kale. Spinach. Onions. Leeks. Peas. Squash. Tomatoes. Peppers. Radishes. Fruits: Apples. Apricots. Avocado. Berries. Bananas. Cherries. Dates. Figs. Grapes. Lemons. Melon. Oranges. Peaches. Plums. Pomegranate. Meats and other protein foods: Beans. Almonds. Sunflower seeds. Pine nuts. Peanuts. Cod. Salmon. Scallops. Shrimp. Tuna. Tilapia. Clams. Oysters. Eggs. Dairy: Low-fat milk. Cheese. Greek yogurt. Beverages: Water. Red wine. Herbal tea. Fats and oils: Extra virgin olive oil. Avocado oil. Grape seed oil. Sweets and desserts: [place] yogurt with honey. Baked apples. Poached pears. Trail mix. Seasoning and other foods: Basil. Cilantro. Coriander. Cumin. Mint. Parsley. Sage. Rosemary. Tarragon. Garlic. Oregano. Thyme. Pepper. Balsalmic vinegar. Tahini. Hummus. Tomato sauce. Olives. Mushrooms. Limit these Grains: Prepackaged pasta or rice dishes. Prepackaged cereal with added sugar. Vegetables: Deep fried potatoes (french fries). Fruits: Fruit canned in syrup. Meats and other protein foods: Beef. Pork. Lamb. Poultry with skin. Hot dogs. Tomasa Blase. Dairy: Ice cream. Sour cream. Whole milk. Beverages: Juice. Sugar-sweetened soft drinks. Beer. Liquor and spirits. Fats and oils: Butter. Canola oil. Vegetable oil. Beef fat (tallow). Lard. Sweets and desserts: Cookies. Cakes. Pies. Candy. Seasoning and other foods: Mayonnaise. Premade sauces and marinades.  The items listed may not be a complete list. Talk with your dietitian about what dietary choices are right for you. Summary The Mediterranean diet includes both food and lifestyle choices. Eat a variety of fresh fruits and vegetables, beans, nuts, seeds, and whole grains. Limit the amount of red meat and sweets that you eat. Talk with your health care provider about whether it is safe for you to drink red wine in moderation. This means 1 glass a day for  nonpregnant women and 2 glasses a day for men. A glass of wine equals 5 oz (150 mL). This information is not intended to replace advice given to you by your health care provider. Make sure you discuss any questions you have with your health care provider. Document Released: 04/03/2016 Document Revised: 05/06/2016 Document Reviewed: 04/03/2016 Elsevier Interactive Patient Education  2017 [employer].

## 2023-05-01 ENCOUNTER — Encounter (HOSPITAL_COMMUNITY): Payer: Self-pay | Admitting: Physician Assistant

## 2023-05-01 ENCOUNTER — Ambulatory Visit (HOSPITAL_COMMUNITY)
Admission: RE | Admit: 2023-05-01 | Discharge: 2023-05-01 | Disposition: A | Discharge status: Home / Self Care | Payer: Medicare Other | Source: Ambulatory Visit | Attending: Physician Assistant | Admitting: Physician Assistant

## 2023-05-01 VITALS — BP 130/90 | HR 121 | Ht 68.0 in | Wt 150.8 lb

## 2023-05-01 DIAGNOSIS — I2510 Atherosclerotic heart disease of native coronary artery without angina pectoris: Secondary | ICD-10-CM | POA: Insufficient documentation

## 2023-05-01 DIAGNOSIS — I4892 Unspecified atrial flutter: Secondary | ICD-10-CM | POA: Insufficient documentation

## 2023-05-01 DIAGNOSIS — Z8673 Personal history of transient ischemic attack (TIA), and cerebral infarction without residual deficits: Secondary | ICD-10-CM | POA: Insufficient documentation

## 2023-05-01 DIAGNOSIS — D6869 Other thrombophilia: Secondary | ICD-10-CM | POA: Insufficient documentation

## 2023-05-01 DIAGNOSIS — I10 Essential (primary) hypertension: Secondary | ICD-10-CM | POA: Diagnosis not present

## 2023-05-01 DIAGNOSIS — I480 Paroxysmal atrial fibrillation: Secondary | ICD-10-CM | POA: Insufficient documentation

## 2023-05-01 DIAGNOSIS — Z7901 Long term (current) use of anticoagulants: Secondary | ICD-10-CM | POA: Diagnosis not present

## 2023-05-01 DIAGNOSIS — I700 Atherosclerosis of aorta: Secondary | ICD-10-CM | POA: Diagnosis not present

## 2023-05-01 DIAGNOSIS — Z79899 Other long term (current) drug therapy: Secondary | ICD-10-CM | POA: Diagnosis not present

## 2023-05-01 NOTE — Progress Notes (Signed)
Primary Care Physician: Crist Fat, MD Primary Cardiologist: Charlton Haws, MD Electrophysiologist: None  Referring Physician: [name] NP   Jason Farmer is a 88 y.o. male with a history of HLD, TIA, HTN, CAD, atrial fibrillation who presents for consultation in the [facility].  The patient was initially diagnosed with atrial fibrillation during an admission 10/2022. He was to have EGD with dilatation for dysphagia and on presentation noted to have HR 140-150. ECG showed new onset rapid afib. He was started on metoprolol for rate control and Eliquis for a CHADS2VASC score of 6. He was asymptomatic. Referred to the AF clinic from his PCP office to establish care.   On follow up today, patient reports that he has felt well from a cardiac standpoint. His HR initially today was 121 bpm and appeared to be in atrial flutter. Repeat ECG a few minutes later showed SR. He was asymptomatic. No bleeding issues on anticoagulation. Of note, his wife of 64 years is on hospice and expected to pass in the next few days.   Today, he denies symptoms of palpitations, chest pain, shortness of breath, orthopnea, PND, lower extremity edema, dizziness, presyncope, syncope, snoring, daytime somnolence, bleeding, or neurologic sequela. The patient is tolerating medications without difficulties and is otherwise without complaint today.    Atrial Fibrillation Risk Factors:  he does not have symptoms or diagnosis of sleep apnea. he does not have a history of rheumatic fever.   Atrial Fibrillation Management history:  Previous antiarrhythmic drugs: none Previous cardioversions: none Previous ablations: none Anticoagulation history: Eliquis  ROS- All systems are reviewed and negative except as per the HPI above.  Past Medical History:  Diagnosis Date   Allergy    Anxiety    "not a problem for a long time" (11/27/2016)   Arthritis    "wrists, knees, shoulders, back"  (11/27/2016)   Chronic lower back pain    "5th disc is protruding" (11/27/2016)   Essential tremor    GERD (gastroesophageal reflux disease)    Glaucoma    Heart murmur    dx'd in [employer] in the 1960s; haven't been seen for it since "(11/27/2016)   Hepatitis ~ 1958   "jaundice kind"   HOH (hard of hearing)    Hyperlipemia    Migraine    "none since my neck OR" (11/27/2016)   Seasonal allergies    TIA (transient ischemic attack) 04/2015   Wears glasses    Wears hearing aid     Current Outpatient Medications  Medication Sig Dispense Refill   acetaminophen (TYLENOL) 325 MG tablet Take 325 mg by mouth every 6 (six) hours as needed for moderate pain or headache.     atenolol (TENORMIN) 25 MG tablet Take 12.5 mg by mouth daily.     azelastine (ASTELIN) 0.1 % nasal spray Place 2 sprays into both nostrils 3 times/day as needed-between meals & bedtime for allergies. Use in each nostril as directed     Cholecalciferol (VITAMIN D3) 125 MCG (5000 UT) CAPS Take 5,000 Units by mouth daily.     ELIQUIS 5 MG TABS tablet Take 1 tablet (5 mg total) by mouth 2 (two) times daily. 60 tablet 0   escitalopram (LEXAPRO) 10 MG tablet TAKE ONE TABLET BY MOUTH ONCE DAILY (Patient taking differently: Take 20 mg by mouth daily.) 30 tablet 0   ezetimibe (ZETIA) 10 MG tablet Take 1 tablet (10 mg total) by mouth daily. 30 tablet 0   latanoprost (  XALATAN) 0.005 % ophthalmic solution Place 1 drop into both eyes at bedtime.      memantine (NAMENDA) 5 MG tablet Take 1 tablet (5 mg at night) for 2 weeks, then increase to 1 tablet (5 mg) twice a day 60 tablet 11   metoprolol tartrate (LOPRESSOR) 50 MG tablet Take 1 tablet (50 mg total) by mouth 2 (two) times daily. 60 tablet 0   omeprazole (PRILOSEC) 20 MG capsule TAKE ONE CAPSULE BY MOUTH ONCE DAILY 30 capsule 0   simvastatin (ZOCOR) 10 MG tablet Take 1 tablet (10 mg total) by mouth every other day. 90 tablet 0   No current facility-administered medications for this encounter.     Physical Exam: BP (!) 130/90   Pulse (!) 121   Ht 5\' 8"  (1.727 m)   Wt 68.4 kg   BMI 22.93 kg/m   GEN: Well nourished, well developed in no acute distress NECK: No JVD; No carotid bruits CARDIAC: Regular rate and rhythm, no murmurs, rubs, gallops RESPIRATORY:  Clear to auscultation without rales, wheezing or rhonchi  ABDOMEN: Soft, non-tender, non-distended EXTREMITIES:  No edema; No deformity   Wt Readings from Last 3 Encounters:  05/01/23 68.4 kg  04/16/23 65.8 kg  04/01/23 65.3 kg     EKG today demonstrates  Likely atypical flutter with 2:1 block, RBBB (reviewed with EP) Vent. rate 121 BPM PR interval * ms QRS duration 132 ms QT/QTcB 348/494 ms  Repeat ECG SB, RBBB Vent. rate 55 BPM PR interval 162 ms QRS duration 138 ms QT/QTcB 446/426 ms   Echo 11/13/22 demonstrated   1. Left ventricular ejection fraction, by estimation, is 55 to 60%. The  left ventricle has normal function. The left ventricle has no regional  wall motion abnormalities. Left ventricular diastolic parameters are  consistent with Grade II diastolic dysfunction (pseudonormalization).   2. Right ventricular systolic function is normal. The right ventricular  size is mildly enlarged. There is normal pulmonary artery systolic  pressure.   3. Left atrial size was mildly dilated.   4. Right atrial size was mildly dilated.   5. The mitral valve is normal in structure. No evidence of mitral valve  regurgitation. No evidence of mitral stenosis.   6. Tricuspid valve regurgitation is moderate.   7. The aortic valve is normal in structure. Aortic valve regurgitation is  not visualized. No aortic stenosis is present.   8. The inferior vena cava is normal in size with greater than 50%  respiratory variability, suggesting right atrial pressure of 3 mmHg.     CHA2DS2-VASc Score = 6  The patient's score is based upon: CHF History: 0 HTN History: 1 Diabetes History: 0 Stroke History: 2  (TIA) Vascular Disease History: 1 (CAD and aortic atheroslcerisis on CT) Age Score: 2 Gender Score: 0       ASSESSMENT AND PLAN: Paroxysmal Atrial Fibrillation/atrial flutter The patient's CHA2DS2-VASc score is 6, indicating a 9.7% annual risk of stroke.   Patient initially appeared to be in atrial flutter but converted within a few minutes to SR. He was asymptomatic. We discussed having him wear a Zio monitor to evaluate his arrhythmia burden. However, his wife is terminally ill and expected to pass away in the next few days. He would understandably like to defer testing for now. Continue Lopressor 50 mg BID Continue Eliquis 5 mg BID  Secondary Hypercoagulable State (ICD10:  D68.69) The patient is at significant risk for stroke/thromboembolism based upon his CHA2DS2-VASc Score of 6.  Continue Apixaban (  Eliquis).   HTN Stable on current regimen  CAD Coronary calcification and aortic atherosclerosis on CT No anginal symptoms.     Follow up in the AF clinic in 4-6 weeks.       Jorja Loa PA-C Af[facility] Clinic [facility] [address] [place], [place] [address] [phone]

## 2023-05-04 ENCOUNTER — Telehealth: Payer: Self-pay | Admitting: Internal Medicine

## 2023-05-04 ENCOUNTER — Other Ambulatory Visit: Payer: Self-pay | Admitting: Adult Health

## 2023-05-04 DIAGNOSIS — R634 Abnormal weight loss: Secondary | ICD-10-CM

## 2023-05-04 DIAGNOSIS — F419 Anxiety disorder, unspecified: Secondary | ICD-10-CM

## 2023-05-04 DIAGNOSIS — K219 Gastro-esophageal reflux disease without esophagitis: Secondary | ICD-10-CM

## 2023-05-04 NOTE — Telephone Encounter (Signed)
Pt daughter is Byrd Hesselbach is calling pt wife is in hospice and she is not expecting to live within 2 wks. Pt may need something else per daughter. Pt need new rx escitalopram (LEXAPRO) 2 0 MG tablet [facility] [place], [place] - [address] [facility] [place] Phone: [phone]  Fax: [phone]

## 2023-05-04 NOTE — Telephone Encounter (Signed)
I spoke with the patient's daughter Byrd Hesselbach and she reported she spoke with Cory's nurse a couple of weeks ago and stated that Lexapro 20 mg was supposed to be called into his pharmacy. I informed the patient that message will be routed to [facility] for clarification.

## 2023-06-03 ENCOUNTER — Other Ambulatory Visit: Payer: Self-pay | Admitting: Adult Health

## 2023-06-03 DIAGNOSIS — F32A Depression, unspecified: Secondary | ICD-10-CM

## 2023-06-03 DIAGNOSIS — K219 Gastro-esophageal reflux disease without esophagitis: Secondary | ICD-10-CM

## 2023-06-03 DIAGNOSIS — R634 Abnormal weight loss: Secondary | ICD-10-CM

## 2023-06-04 ENCOUNTER — Encounter (HOSPITAL_COMMUNITY): Payer: Self-pay

## 2023-06-04 ENCOUNTER — Ambulatory Visit (HOSPITAL_COMMUNITY): Payer: Medicare Other | Admitting: Physician Assistant

## 2023-06-19 ENCOUNTER — Other Ambulatory Visit: Payer: Self-pay | Admitting: Adult Health

## 2023-06-19 NOTE — Telephone Encounter (Signed)
Is he still your patient?

## 2023-06-23 ENCOUNTER — Other Ambulatory Visit: Payer: Self-pay | Admitting: Physician Assistant

## 2023-06-23 NOTE — Telephone Encounter (Signed)
Please contact [name] for refills, thanks

## 2023-06-24 ENCOUNTER — Other Ambulatory Visit: Payer: Self-pay | Admitting: Adult Health

## 2023-06-25 ENCOUNTER — Other Ambulatory Visit: Payer: Self-pay | Admitting: Internal Medicine

## 2023-07-13 ENCOUNTER — Other Ambulatory Visit: Payer: Self-pay | Admitting: Adult Health

## 2023-07-13 DIAGNOSIS — K219 Gastro-esophageal reflux disease without esophagitis: Secondary | ICD-10-CM

## 2023-07-13 DIAGNOSIS — R634 Abnormal weight loss: Secondary | ICD-10-CM

## 2023-07-13 DIAGNOSIS — F419 Anxiety disorder, unspecified: Secondary | ICD-10-CM

## 2023-07-14 ENCOUNTER — Other Ambulatory Visit: Payer: Self-pay

## 2023-07-14 ENCOUNTER — Telehealth: Payer: Self-pay | Admitting: Internal Medicine

## 2023-07-14 DIAGNOSIS — F32A Depression, unspecified: Secondary | ICD-10-CM

## 2023-07-14 DIAGNOSIS — R634 Abnormal weight loss: Secondary | ICD-10-CM

## 2023-07-14 DIAGNOSIS — K219 Gastro-esophageal reflux disease without esophagitis: Secondary | ICD-10-CM

## 2023-07-14 MED ORDER — METOPROLOL TARTRATE 50 MG PO TABS: 50.0000 mg | ORAL_TABLET | Freq: Two times a day (BID) | ORAL | 0 refills | Status: DC

## 2023-07-14 MED ORDER — ESCITALOPRAM OXALATE 10 MG PO TABS: 10.0000 mg | ORAL_TABLET | Freq: Every day | ORAL | 2 refills | Status: AC

## 2023-07-14 MED ORDER — OMEPRAZOLE 20 MG PO CPDR: 20.0000 mg | DELAYED_RELEASE_CAPSULE | Freq: Every day | ORAL | 0 refills | Status: DC

## 2023-07-14 MED ORDER — EZETIMIBE 10 MG PO TABS: 10.0000 mg | ORAL_TABLET | Freq: Every day | ORAL | 0 refills | Status: DC

## 2023-07-14 MED ORDER — MIRTAZAPINE 15 MG PO TABS: 15.0000 mg | ORAL_TABLET | Freq: Every day | ORAL | 0 refills | Status: DC

## 2023-07-14 NOTE — Telephone Encounter (Signed)
Patient states he moved to [place].  He had been using the dr there and it has not worked out so he is requesting to still use [facility] as his provider.  He is requesting a call back.  He said he would also like to make an appointment.  I needed to get clearance before making the appointment.

## 2023-07-14 NOTE — Telephone Encounter (Signed)
Please advise 

## 2023-07-14 NOTE — Progress Notes (Signed)
Rx Refill

## 2023-07-16 ENCOUNTER — Ambulatory Visit: Payer: Medicare Other | Admitting: Internal Medicine

## 2023-08-04 ENCOUNTER — Other Ambulatory Visit: Payer: Self-pay | Admitting: Internal Medicine

## 2023-08-04 DIAGNOSIS — F419 Anxiety disorder, unspecified: Secondary | ICD-10-CM

## 2023-08-04 DIAGNOSIS — R634 Abnormal weight loss: Secondary | ICD-10-CM

## 2023-08-04 DIAGNOSIS — K219 Gastro-esophageal reflux disease without esophagitis: Secondary | ICD-10-CM

## 2023-08-13 ENCOUNTER — Ambulatory Visit: Payer: Medicare Other | Admitting: Internal Medicine

## 2023-08-13 ENCOUNTER — Encounter: Payer: Self-pay | Admitting: Internal Medicine

## 2023-08-13 VITALS — BP 140/100 | HR 52 | Temp 98.8°F | Resp 20 | Ht 68.0 in | Wt 150.0 lb

## 2023-08-13 DIAGNOSIS — H6122 Impacted cerumen, left ear: Secondary | ICD-10-CM | POA: Diagnosis not present

## 2023-08-13 DIAGNOSIS — R42 Dizziness and giddiness: Secondary | ICD-10-CM

## 2023-08-13 LAB — POC COVID19 BINAXNOW: SARS Coronavirus 2 Ag: NEGATIVE

## 2023-08-13 MED ORDER — MECLIZINE HCL 12.5 MG PO TABS: 12.5000 mg | ORAL_TABLET | Freq: Three times a day (TID) | ORAL | 0 refills | Status: AC | PRN

## 2023-08-13 MED ORDER — FLUTICASONE PROPIONATE 50 MCG/ACT NA SUSP: 1.0000 | Freq: Two times a day (BID) | NASAL | 0 refills | Status: DC

## 2023-08-13 NOTE — Assessment & Plan Note (Signed)
We irrigated his ear with good effect.

## 2023-08-13 NOTE — Progress Notes (Signed)
Office Visit  Subjective   Patient ID: Jason Farmer   DOB: March 14, 1935   Age: 88 y.o.   MRN: [id number]   Chief Complaint Chief Complaint  Patient presents with   office visit    Patient here for syncope      History of Present Illness Mr. Hodgkiss is a 88 yo male who comes in today for an acute visit for presyncope.  He was at meeting earlier today where he went to get into his car and had to grab hold of if it as he had dizziness and feelings he was going to pass out with presyncope.  He never did lose consciousness and he denied any chest pain, palpitations, SOB, diaphoresis, slurred speech, change in vision or focal neurologic deficits.  He states he has not felt well for the last few days where he has felt nauseated and fatigue.  He has had some clear nasal discharge and post nasal drip but no cough and no fevers, chills, diarrhea or vomiting.  His daughter states that he has been unsteady on his feet and she had to help him into clinic.  He states if he stands, the room will spin.  He does have a history of A. Fib but denies any problems.  He states he feels "woozy" and has sinus congestion.  If his allergies are bad, he will get lightheaded.  He states when he looks up or down he becomes woozy.       Past Medical History Past Medical History:  Diagnosis Date   Allergy    Anxiety    "not a problem for a long time" (11/27/2016)   Arthritis    "wrists, knees, shoulders, back" (11/27/2016)   Chronic lower back pain    "5th disc is protruding" (11/27/2016)   Essential tremor    GERD (gastroesophageal reflux disease)    Glaucoma    Heart murmur    dx'd in [employer] in the 1960s; haven't been seen for it since "(11/27/2016)   Hepatitis ~ 1958   "jaundice kind"   HOH (hard of hearing)    Hyperlipemia    Migraine    "none since my neck OR" (11/27/2016)   Seasonal allergies    TIA (transient ischemic attack) 04/2015   Wears glasses    Wears hearing aid      Allergies Allergies   Allergen Reactions   Statins Other (See Comments)    Muscle cramps    Aspirin Other (See Comments)    bleeding     Medications  Current Outpatient Medications:    fluticasone (FLONASE) 50 MCG/ACT nasal spray, Place 1 spray into both nostrils in the morning and at bedtime., Disp: 15.8 mL, Rfl: 0   meclizine (ANTIVERT) 12.5 MG tablet, Take 1 tablet (12.5 mg total) by mouth 3 (three) times daily as needed for dizziness., Disp: 30 tablet, Rfl: 0   acetaminophen (TYLENOL) 325 MG tablet, Take 325 mg by mouth every 6 (six) hours as needed for moderate pain or headache., Disp: , Rfl:    atenolol (TENORMIN) 25 MG tablet, Take 12.5 mg by mouth daily., Disp: , Rfl:    azelastine (ASTELIN) 0.1 % nasal spray, Place 2 sprays into both nostrils 3 times/day as needed-between meals & bedtime for allergies. Use in each nostril as directed, Disp: , Rfl:    Cholecalciferol (VITAMIN D3) 125 MCG (5000 UT) CAPS, Take 5,000 Units by mouth daily., Disp: , Rfl:    ELIQUIS 5 MG TABS tablet, TAKE ONE  TABLET BY MOUTH TWICE DAILY, Disp: 60 tablet, Rfl: 0   escitalopram (LEXAPRO) 10 MG tablet, Take 1 tablet (10 mg total) by mouth daily., Disp: 90 tablet, Rfl: 2   ezetimibe (ZETIA) 10 MG tablet, TAKE ONE TABLET BY MOUTH EVERY EVENING, Disp: 30 tablet, Rfl: 0   latanoprost (XALATAN) 0.005 % ophthalmic solution, Place 1 drop into both eyes at bedtime. , Disp: , Rfl:    memantine (NAMENDA) 5 MG tablet, Take 1 tablet (5 mg at night) for 2 weeks, then increase to 1 tablet (5 mg) twice a day, Disp: 60 tablet, Rfl: 11   metoprolol tartrate (LOPRESSOR) 50 MG tablet, TAKE ONE TABLET BY MOUTH TWICE DAILY, Disp: 60 tablet, Rfl: 0   mirtazapine (REMERON) 15 MG tablet, TAKE ONE TABLET BY MOUTH EVERY EVENING, Disp: 30 tablet, Rfl: 0   omeprazole (PRILOSEC) 20 MG capsule, TAKE ONE CAPSULE BY MOUTH EVERY MORNING, Disp: 30 capsule, Rfl: 0   simvastatin (ZOCOR) 10 MG tablet, Take 1 tablet (10 mg total) by mouth every other day., Disp: 90  tablet, Rfl: 0   Review of Systems Review of Systems  Constitutional:  Positive for malaise/fatigue. Negative for chills and fever.  Eyes:  Negative for blurred vision and double vision.  Respiratory:  Negative for cough, shortness of breath and wheezing.   Cardiovascular:  Negative for chest pain, palpitations and leg swelling.  Gastrointestinal:  Positive for nausea. Negative for abdominal pain, constipation, diarrhea and vomiting.  Genitourinary:  Negative for dysuria and frequency.  Musculoskeletal:  Negative for myalgias.  Skin:  Negative for itching and rash.  Neurological:  Positive for dizziness. Negative for focal weakness, weakness and headaches.       Objective:    Vitals BP (!) 140/100 (BP Location: Left Arm, Patient Position: Lying left side, Cuff Size: Normal)   Pulse (!) 52   Temp 98.8 F (37.1 C)   Resp 20   Ht 5\' 8"  (1.727 m)   Wt 150 lb (68 kg)   SpO2 99%   BMI 22.81 kg/m    Physical Examination Physical Exam Constitutional:      Appearance: Normal appearance. He is not ill-appearing.  HENT:     Right Ear: Tympanic membrane, ear canal and external ear normal.     Left Ear: Tympanic membrane, ear canal and external ear normal.     Ears:     Comments: Cerumen impaction on left    Mouth/Throat:     Mouth: Mucous membranes are moist.     Pharynx: Oropharynx is clear. No posterior oropharyngeal erythema.  Eyes:     General: No scleral icterus.    Conjunctiva/sclera: Conjunctivae normal.     Pupils: Pupils are equal, round, and reactive to light.  Neck:     Vascular: No carotid bruit.  Cardiovascular:     Rate and Rhythm: Normal rate and regular rhythm.     Pulses: Normal pulses.     Heart sounds: No murmur heard.    No friction rub. No gallop.  Pulmonary:     Effort: Pulmonary effort is normal. No respiratory distress.     Breath sounds: No wheezing, rhonchi or rales.  Abdominal:     General: Abdomen is flat. Bowel sounds are normal. There is no  distension.     Palpations: Abdomen is soft.     Tenderness: There is no abdominal tenderness.  Musculoskeletal:     Cervical back: Neck supple. No tenderness.     Right lower leg: No edema.  Left lower leg: No edema.  Lymphadenopathy:     Cervical: No cervical adenopathy.  Skin:    General: Skin is warm and dry.     Findings: No rash.  Neurological:     Mental Status: He is alert.     Comments: CN II-XII are fully intact.  His strength is 5/5 throughout.  His rapid alternating movements were normal.  Past pointing and heel to shin were normal bilaterally.  Her DTR's were 2+ on upper and lower extremitites.        Assessment & Plan:   Dizziness He is still having some unsteadiness and I recommend them to go to the ER.  I do not have EKG compability at the clinic but the patient on exam seems to be in sinus rhythm.  I did orthostatics on him and he is not orthostatic.  He has some abnormal turgor pressure in his midline chest.  His neuro exam was completely normal.  We irrigated his left ear and he has a clear ear effusion.  He has probable eusthacian tube dsyfunction.  The family states he has gone multiple times to the ER for the same thing and they never find out what's wrong with him and his symptoms resolve.  I told him we have done everything in our clinic for him but he may need additional labs tests, CT scan and other workup.  If he is not improving in the next several hours, I would take him to the ER.  I am going to give him antivert as needed.  Impacted cerumen of left ear We irrigated his ear with good effect.    No follow-ups on file.   Crist Fat, MD

## 2023-08-13 NOTE — Assessment & Plan Note (Signed)
He is still having some unsteadiness and I recommend them to go to the ER.  I do not have EKG compability at the clinic but the patient on exam seems to be in sinus rhythm.  I did orthostatics on him and he is not orthostatic.  He has some abnormal turgor pressure in his midline chest.  His neuro exam was completely normal.  We irrigated his left ear and he has a clear ear effusion.  He has probable eusthacian tube dsyfunction.  The family states he has gone multiple times to the ER for the same thing and they never find out what's wrong with him and his symptoms resolve.  I told him we have done everything in our clinic for him but he may need additional labs tests, CT scan and other workup.  If he is not improving in the next several hours, I would take him to the ER.  I am going to give him antivert as needed.

## 2023-08-22 ENCOUNTER — Other Ambulatory Visit: Payer: Self-pay | Admitting: Internal Medicine

## 2023-08-22 DIAGNOSIS — K219 Gastro-esophageal reflux disease without esophagitis: Secondary | ICD-10-CM

## 2023-08-22 DIAGNOSIS — R634 Abnormal weight loss: Secondary | ICD-10-CM

## 2023-08-22 DIAGNOSIS — F32A Anxiety disorder, unspecified: Secondary | ICD-10-CM

## 2023-09-19 ENCOUNTER — Other Ambulatory Visit: Payer: Self-pay | Admitting: Internal Medicine

## 2023-09-19 DIAGNOSIS — K219 Gastro-esophageal reflux disease without esophagitis: Secondary | ICD-10-CM

## 2023-09-19 DIAGNOSIS — F419 Anxiety disorder, unspecified: Secondary | ICD-10-CM

## 2023-09-19 DIAGNOSIS — R634 Abnormal weight loss: Secondary | ICD-10-CM

## 2023-09-26 ENCOUNTER — Emergency Department (HOSPITAL_COMMUNITY): Payer: Medicare Other

## 2023-09-26 ENCOUNTER — Inpatient Hospital Stay (HOSPITAL_BASED_OUTPATIENT_CLINIC_OR_DEPARTMENT_OTHER)
Admission: EM | Admit: 2023-09-26 | Discharge: 2023-09-28 | DRG: 309 | Disposition: A | Discharge status: Home / Self Care | Payer: Medicare Other | Attending: Internal Medicine | Admitting: Internal Medicine

## 2023-09-26 ENCOUNTER — Other Ambulatory Visit: Payer: Self-pay

## 2023-09-26 ENCOUNTER — Emergency Department (HOSPITAL_BASED_OUTPATIENT_CLINIC_OR_DEPARTMENT_OTHER): Payer: Medicare Other

## 2023-09-26 ENCOUNTER — Encounter (HOSPITAL_BASED_OUTPATIENT_CLINIC_OR_DEPARTMENT_OTHER): Payer: Self-pay

## 2023-09-26 DIAGNOSIS — Z79899 Other long term (current) drug therapy: Secondary | ICD-10-CM | POA: Diagnosis not present

## 2023-09-26 DIAGNOSIS — Z8249 Family history of ischemic heart disease and other diseases of the circulatory system: Secondary | ICD-10-CM | POA: Diagnosis not present

## 2023-09-26 DIAGNOSIS — Z9842 Cataract extraction status, left eye: Secondary | ICD-10-CM

## 2023-09-26 DIAGNOSIS — H409 Unspecified glaucoma: Secondary | ICD-10-CM | POA: Diagnosis present

## 2023-09-26 DIAGNOSIS — Z83511 Family history of glaucoma: Secondary | ICD-10-CM

## 2023-09-26 DIAGNOSIS — E785 Hyperlipidemia, unspecified: Secondary | ICD-10-CM | POA: Diagnosis present

## 2023-09-26 DIAGNOSIS — K219 Gastro-esophageal reflux disease without esophagitis: Secondary | ICD-10-CM | POA: Diagnosis present

## 2023-09-26 DIAGNOSIS — Z888 Allergy status to other drugs, medicaments and biological substances status: Secondary | ICD-10-CM | POA: Diagnosis not present

## 2023-09-26 DIAGNOSIS — Z974 Presence of external hearing-aid: Secondary | ICD-10-CM | POA: Diagnosis not present

## 2023-09-26 DIAGNOSIS — Z8673 Personal history of transient ischemic attack (TIA), and cerebral infarction without residual deficits: Secondary | ICD-10-CM

## 2023-09-26 DIAGNOSIS — R634 Abnormal weight loss: Secondary | ICD-10-CM

## 2023-09-26 DIAGNOSIS — F0392 Unspecified dementia, unspecified severity, with psychotic disturbance: Secondary | ICD-10-CM | POA: Diagnosis present

## 2023-09-26 DIAGNOSIS — Z886 Allergy status to analgesic agent status: Secondary | ICD-10-CM

## 2023-09-26 DIAGNOSIS — R918 Other nonspecific abnormal finding of lung field: Secondary | ICD-10-CM | POA: Diagnosis not present

## 2023-09-26 DIAGNOSIS — N1832 Chronic kidney disease, stage 3b: Secondary | ICD-10-CM | POA: Diagnosis present

## 2023-09-26 DIAGNOSIS — R443 Hallucinations, unspecified: Secondary | ICD-10-CM | POA: Diagnosis not present

## 2023-09-26 DIAGNOSIS — G319 Degenerative disease of nervous system, unspecified: Secondary | ICD-10-CM | POA: Diagnosis not present

## 2023-09-26 DIAGNOSIS — I6782 Cerebral ischemia: Secondary | ICD-10-CM | POA: Diagnosis not present

## 2023-09-26 DIAGNOSIS — D320 Benign neoplasm of cerebral meninges: Secondary | ICD-10-CM | POA: Diagnosis not present

## 2023-09-26 DIAGNOSIS — I2510 Atherosclerotic heart disease of native coronary artery without angina pectoris: Secondary | ICD-10-CM | POA: Diagnosis present

## 2023-09-26 DIAGNOSIS — Z981 Arthrodesis status: Secondary | ICD-10-CM

## 2023-09-26 DIAGNOSIS — R059 Cough, unspecified: Secondary | ICD-10-CM | POA: Diagnosis not present

## 2023-09-26 DIAGNOSIS — I4510 Unspecified right bundle-branch block: Secondary | ICD-10-CM | POA: Diagnosis present

## 2023-09-26 DIAGNOSIS — Z961 Presence of intraocular lens: Secondary | ICD-10-CM | POA: Diagnosis present

## 2023-09-26 DIAGNOSIS — R220 Localized swelling, mass and lump, head: Secondary | ICD-10-CM | POA: Diagnosis not present

## 2023-09-26 DIAGNOSIS — I129 Hypertensive chronic kidney disease with stage 1 through stage 4 chronic kidney disease, or unspecified chronic kidney disease: Secondary | ICD-10-CM | POA: Diagnosis not present

## 2023-09-26 DIAGNOSIS — D6869 Other thrombophilia: Secondary | ICD-10-CM | POA: Diagnosis present

## 2023-09-26 DIAGNOSIS — Z7901 Long term (current) use of anticoagulants: Secondary | ICD-10-CM | POA: Diagnosis not present

## 2023-09-26 DIAGNOSIS — J069 Acute upper respiratory infection, unspecified: Secondary | ICD-10-CM | POA: Insufficient documentation

## 2023-09-26 DIAGNOSIS — R4182 Altered mental status, unspecified: Secondary | ICD-10-CM | POA: Diagnosis not present

## 2023-09-26 DIAGNOSIS — Z1152 Encounter for screening for COVID-19: Secondary | ICD-10-CM | POA: Diagnosis not present

## 2023-09-26 DIAGNOSIS — R001 Bradycardia, unspecified: Secondary | ICD-10-CM | POA: Diagnosis present

## 2023-09-26 DIAGNOSIS — I4892 Unspecified atrial flutter: Secondary | ICD-10-CM | POA: Diagnosis not present

## 2023-09-26 DIAGNOSIS — F32A Depression, unspecified: Secondary | ICD-10-CM

## 2023-09-26 DIAGNOSIS — R3129 Other microscopic hematuria: Secondary | ICD-10-CM | POA: Diagnosis present

## 2023-09-26 DIAGNOSIS — Z87891 Personal history of nicotine dependence: Secondary | ICD-10-CM | POA: Diagnosis not present

## 2023-09-26 DIAGNOSIS — I4891 Unspecified atrial fibrillation: Secondary | ICD-10-CM | POA: Diagnosis not present

## 2023-09-26 DIAGNOSIS — Z9049 Acquired absence of other specified parts of digestive tract: Secondary | ICD-10-CM

## 2023-09-26 DIAGNOSIS — Z9841 Cataract extraction status, right eye: Secondary | ICD-10-CM

## 2023-09-26 LAB — COMPREHENSIVE METABOLIC PANEL
ALT: 11 U/L (ref 0–44)
AST: 18 U/L (ref 15–41)
Albumin: 3.8 g/dL (ref 3.5–5.0)
Alkaline Phosphatase: 69 U/L (ref 38–126)
Anion gap: 7 (ref 5–15)
BUN: 16 mg/dL (ref 8–23)
CO2: 25 mmol/L (ref 22–32)
Calcium: 9.3 mg/dL (ref 8.9–10.3)
Chloride: 104 mmol/L (ref 98–111)
Creatinine, Ser: 1.32 mg/dL — ABNORMAL HIGH (ref 0.61–1.24)
GFR, Estimated: 53 mL/min — ABNORMAL LOW (ref >60)
Glucose, Bld: 130 mg/dL — ABNORMAL HIGH (ref 70–99)
Potassium: 4.4 mmol/L (ref 3.5–5.1)
Sodium: 136 mmol/L (ref 135–145)
Total Bilirubin: 0.6 mg/dL (ref 0.0–1.2)
Total Protein: 7.1 g/dL (ref 6.5–8.1)

## 2023-09-26 LAB — CBC
HCT: 43.5 % (ref 39.0–52.0)
Hemoglobin: 14.6 g/dL (ref 13.0–17.0)
MCH: 30.2 pg (ref 26.0–34.0)
MCHC: 33.6 g/dL (ref 30.0–36.0)
MCV: 89.9 fL (ref 80.0–100.0)
Platelets: 257 10*3/uL (ref 150–400)
RBC: 4.84 MIL/uL (ref 4.22–5.81)
RDW: 13.3 % (ref 11.5–15.5)
WBC: 5.7 10*3/uL (ref 4.0–10.5)
nRBC: 0 % (ref 0.0–0.2)

## 2023-09-26 LAB — URINALYSIS, ROUTINE W REFLEX MICROSCOPIC
Bacteria, UA: NONE SEEN
Bilirubin Urine: NEGATIVE
Glucose, UA: NEGATIVE mg/dL
Ketones, ur: NEGATIVE mg/dL
Leukocytes,Ua: NEGATIVE
Nitrite: NEGATIVE
Specific Gravity, Urine: 1.019 (ref 1.005–1.030)
pH: 6 (ref 5.0–8.0)

## 2023-09-26 LAB — RESP PANEL BY RT-PCR (RSV, FLU A&B, COVID)  RVPGX2
Influenza A by PCR: NEGATIVE
Influenza B by PCR: NEGATIVE
Resp Syncytial Virus by PCR: NEGATIVE
SARS Coronavirus 2 by RT PCR: NEGATIVE

## 2023-09-26 LAB — CBG MONITORING, ED: Glucose-Capillary: 125 mg/dL — ABNORMAL HIGH (ref 70–99)

## 2023-09-26 MED ORDER — ACETAMINOPHEN 500 MG PO TABS: 1000.0000 mg | ORAL_TABLET | Freq: Four times a day (QID) | ORAL | Status: DC | PRN

## 2023-09-26 MED ORDER — APIXABAN 5 MG PO TABS
5.0000 mg | ORAL_TABLET | Freq: Once | ORAL | Status: AC
Start: 2023-09-26 — End: 2023-09-26
  Administered 2023-09-26: 5 mg via ORAL
  Filled 2023-09-26: qty 1

## 2023-09-26 MED ORDER — APIXABAN 5 MG PO TABS
5.0000 mg | ORAL_TABLET | Freq: Two times a day (BID) | ORAL | Status: DC
  Administered 2023-09-27 – 2023-09-28 (×3): 5 mg via ORAL
  Filled 2023-09-26 (×3): qty 1

## 2023-09-26 MED ORDER — ESCITALOPRAM OXALATE 10 MG PO TABS
10.0000 mg | ORAL_TABLET | Freq: Every day | ORAL | Status: DC
  Administered 2023-09-27 – 2023-09-28 (×2): 10 mg via ORAL
  Filled 2023-09-26 (×2): qty 1

## 2023-09-26 MED ORDER — PROPOFOL 10 MG/ML IV BOLUS
INTRAVENOUS | Status: AC | PRN
  Administered 2023-09-26 (×3): 10 mg via INTRAVENOUS

## 2023-09-26 MED ORDER — MELATONIN 3 MG PO TABS
6.0000 mg | ORAL_TABLET | Freq: Every day | ORAL | Status: DC
  Administered 2023-09-27 (×2): 6 mg via ORAL
  Filled 2023-09-26 (×2): qty 2

## 2023-09-26 MED ORDER — SODIUM CHLORIDE 0.9 % IV BOLUS
1000.0000 mL | Freq: Once | INTRAVENOUS | Status: AC
  Administered 2023-09-26: 1000 mL via INTRAVENOUS

## 2023-09-26 MED ORDER — PROPOFOL 10 MG/ML IV BOLUS
0.5000 mg/kg | Freq: Once | INTRAVENOUS | Status: AC
  Administered 2023-09-26: 30 mg via INTRAVENOUS
  Filled 2023-09-26: qty 20

## 2023-09-26 MED ORDER — EZETIMIBE 10 MG PO TABS
10.0000 mg | ORAL_TABLET | Freq: Every evening | ORAL | Status: DC
  Administered 2023-09-27: 10 mg via ORAL
  Filled 2023-09-26: qty 1

## 2023-09-26 MED ORDER — MIRTAZAPINE 15 MG PO TABS
7.5000 mg | ORAL_TABLET | Freq: Every day | ORAL | Status: DC
  Administered 2023-09-27 (×2): 7.5 mg via ORAL
  Filled 2023-09-26 (×2): qty 1

## 2023-09-26 MED ORDER — METOPROLOL TARTRATE 5 MG/5ML IV SOLN
5.0000 mg | Freq: Once | INTRAVENOUS | Status: AC
  Administered 2023-09-26: 5 mg via INTRAVENOUS
  Filled 2023-09-26: qty 5

## 2023-09-26 MED ORDER — MEMANTINE HCL 10 MG PO TABS
5.0000 mg | ORAL_TABLET | Freq: Two times a day (BID) | ORAL | Status: DC
  Administered 2023-09-27 – 2023-09-28 (×4): 5 mg via ORAL
  Filled 2023-09-26: qty 1
  Filled 2023-09-26: qty 0.5
  Filled 2023-09-26 (×2): qty 1

## 2023-09-26 MED ORDER — LATANOPROST 0.005 % OP SOLN
1.0000 [drp] | Freq: Every day | OPHTHALMIC | Status: DC
  Administered 2023-09-27: 1 [drp] via OPHTHALMIC
  Filled 2023-09-26: qty 2.5

## 2023-09-26 MED ORDER — SODIUM CHLORIDE 0.9% FLUSH
3.0000 mL | Freq: Two times a day (BID) | INTRAVENOUS | Status: DC
  Administered 2023-09-26 – 2023-09-28 (×3): 3 mL via INTRAVENOUS

## 2023-09-26 MED ORDER — QUETIAPINE FUMARATE 25 MG PO TABS
25.0000 mg | ORAL_TABLET | Freq: Every day | ORAL | Status: DC
  Administered 2023-09-27 (×2): 25 mg via ORAL
  Filled 2023-09-26 (×2): qty 1

## 2023-09-26 MED ORDER — PANTOPRAZOLE SODIUM 40 MG PO TBEC
40.0000 mg | DELAYED_RELEASE_TABLET | Freq: Every day | ORAL | Status: DC
  Administered 2023-09-27 – 2023-09-28 (×2): 40 mg via ORAL
  Filled 2023-09-26 (×2): qty 1

## 2023-09-26 MED ORDER — POLYETHYLENE GLYCOL 3350 17 G PO PACK: 17.0000 g | PACK | Freq: Every day | ORAL | Status: DC | PRN

## 2023-09-26 MED ORDER — METOPROLOL TARTRATE 25 MG PO TABS
50.0000 mg | ORAL_TABLET | Freq: Two times a day (BID) | ORAL | Status: DC
  Administered 2023-09-27 – 2023-09-28 (×2): 50 mg via ORAL
  Filled 2023-09-26 (×3): qty 2

## 2023-09-26 NOTE — ED Provider Notes (Signed)
[facility] for [facility] due to ongoing hallucinations that started approximately 3 days ago but that are worsening.  Patient did complain of URI symptoms in the last 5 days but denies any fever, chest pain or shortness of breath.  Patient was noted to be tachycardic upon arrival to [facility] which has been persistent.  He does have a history of atrial fibrillation but is usually in sinus rhythm.  Patient did receive fluids but still persistently sees things on the wall.  Patient is very lucid and awake.  Does not appear to be altered but reports if he looks anywhere too long he sees things like writing on the wall and sometimes people moving in his room with more consistent with delirium.  Patient's labs done at med center with [facility] do not show acute findings.  No evidence of UTI and renal function is similar to prior.  COVID and flu are negative.  Looking at EKG from [facility] concern patient may have a flutter given his persistent rate that has been unchanged even with fluids.  Will repeat EKG here and get a chest x-ray.  Will also order MRI which was requested by neurology. 8:07 PM Repeat EKG here showed a sinus bradycardia with resolution of fast rate which was most likely aflutter. And MRI was neg for acute finding. 8:59 PM Spoke with Dr. Randal Buba with neurology who was concerned that patient may be experiencing Lewy body dementia.  No further testing is necessary in the hospital but did recommend that he follow back up with [facility] neurology.  Also recommended trying Seroquel 12.5 twice daily or 25 mg at night.  Patient's repeat EKG does show atrial fibrillation with RVR.  Spoke with the patient's daughter and she can confirm that he has been consistent with taking his medications.  He only missed 1 dose of Eliquis last night but otherwise has had every other dose.  Feel that patient would be a candidate for cardioversion if he does not convert with medication.  He  is comfortable with this plan.  .Cardioversion  Date/Time: 09/26/2023 10:07 PM  Performed by: Gwyneth Sprout, MD Authorized by: Gwyneth Sprout, MD   Consent:    Consent obtained:  Written   Consent given by:  Patient   Risks discussed:  Induced arrhythmia, pain and death   Alternatives discussed:  Rate-control medication Pre-procedure details:    Cardioversion basis:  Elective   Rhythm:  Atrial fibrillation   Electrode placement:  Anterior-posterior Patient sedated: Yes. Refer to sedation procedure documentation for details of sedation.  Attempt one:    Cardioversion mode:  Synchronous   Waveform:  Biphasic   Shock (Joules):  120   Cardioversion outcome attempt one: conversion to sinus and then reverted back to afib. Post-procedure details:    Patient tolerance of procedure:  Tolerated well, no immediate complications .Sedation  Date/Time: 09/26/2023 10:08 PM  Performed by: Gwyneth Sprout, MD Authorized by: Gwyneth Sprout, MD   Consent:    Consent obtained:  Verbal   Consent given by:  Patient   Risks discussed:  Allergic reaction, dysrhythmia, inadequate sedation, nausea, prolonged hypoxia resulting in organ damage, prolonged sedation necessitating reversal, respiratory compromise necessitating ventilatory assistance and intubation and vomiting   Alternatives discussed:  Analgesia without sedation, anxiolysis and regional anesthesia Universal protocol:    Procedure explained and questions answered to patient or proxy's satisfaction: yes     Relevant documents present and verified: yes     Test results available: yes  Imaging studies available: yes     Required blood products, implants, devices, and special equipment available: yes     Site/side marked: yes     Immediately prior to procedure, a time out was called: yes     Patient identity confirmed:  Verbally with patient Indications:    Procedure necessitating sedation performed by:  Physician performing  sedation Pre-sedation assessment:    Time since last food or drink:  4pm   ASA classification: class 2 - patient with mild systemic disease     Mouth opening:  3 or more finger widths   Thyromental distance:  4 finger widths   Mallampati score:  I - soft palate, uvula, fauces, pillars visible   Neck mobility: normal     Pre-sedation assessments completed and reviewed: airway patency, cardiovascular function, hydration status, mental status, nausea/vomiting, pain level, respiratory function and temperature   A pre-sedation assessment was completed prior to the start of the procedure Immediate pre-procedure details:    Reassessment: Patient reassessed immediately prior to procedure     Reviewed: vital signs, relevant labs/tests and NPO status     Verified: bag valve mask available, emergency equipment available, intubation equipment available, IV patency confirmed, oxygen available and suction available   Procedure details (see MAR for exact dosages):    Preoxygenation:  Nasal cannula   Sedation:  Propofol   Intended level of sedation: deep   Analgesia:  None   Intra-procedure monitoring:  Blood pressure monitoring, cardiac monitor, continuous pulse oximetry, frequent LOC assessments, frequent vital sign checks and continuous capnometry   Intra-procedure events: none     Total Provider sedation time (minutes):  15 Post-procedure details:   A post-sedation assessment was completed following the completion of the procedure.   Attendance: Constant attendance by certified staff until patient recovered     Recovery: Patient returned to pre-procedure baseline     Post-sedation assessments completed and reviewed: airway patency, cardiovascular function, hydration status, mental status, nausea/vomiting, pain level, respiratory function and temperature     Patient is stable for discharge or admission: yes     Procedure completion:  Tolerated well, no immediate complications Pain was initially  shocked back into rhythm however he then went back into atrial fibrillation and continued to bounce in and out of atrial fibrillation.  Given patient's new hallucinations, intermittent A-fib feel that he would benefit from admission to ensure he remains in A-fib he can start the Seroquel 25 mg tonight to see if that helps with his hallucinations to ensure he is safe to go home as he lives alone.  CRITICAL CARE Performed by: Tallulah Hosman Total critical care time: 30 minutes Critical care time was exclusive of separately billable procedures and treating other patients. Critical care was necessary to treat or prevent imminent or life-threatening deterioration. Critical care was time spent personally by me on the following activities: development of treatment plan with patient and/or surrogate as well as nursing, discussions with consultants, evaluation of patient's response to treatment, examination of patient, obtaining history from patient or surrogate, ordering and performing treatments and interventions, ordering and review of laboratory studies, ordering and review of radiographic studies, pulse oximetry and re-evaluation of patient's condition.     Gwyneth Sprout, MD 09/26/23 [phone]

## 2023-09-26 NOTE — ED Notes (Signed)
 ED Provider at bedside.

## 2023-09-26 NOTE — ED Provider Notes (Signed)
[place] EMERGENCY DEPARTMENT AT [facility] Provider Note   CSN: [id number] Arrival date & time: 09/26/23  1142     History  Chief Complaint  Patient presents with   Hallucinations   Fatigue    Jason Farmer is a 89 y.o. male.  HPI   89 year old male presents emergency department companied by his daughter for concern of hallucinations.  There is ongoing diagnosis of dementia, follows with neurology as an outpatient.  However patient is frustrated with this history and does not like to talk about it.  Patient is from living facility where he is in independent living.  She states he has known to have sleepwalking, vivid night dreams, sometimes night terrors.  However a few times in the past he has had daytime hallucinations usually related to some sort of infection, UTI.  Over the last couple days he has been having daytime hallucinations where he has seen his spouse, certain things are advertisements on the wall, ships going out to sea.  There is been no other acute change in his health or medications.  For the most part patient is aware that these hallucinations are not real.  There is an auditory component to this.  No revealed seizure-like activity.  Currently this is baseline neurologic self.  Home Medications Prior to Admission medications   Medication Sig Start Date End Date Taking? Authorizing Provider  ELIQUIS 5 MG TABS tablet TAKE ONE TABLET BY MOUTH TWICE DAILY 09/21/23  Yes Crist Fat, MD  escitalopram (LEXAPRO) 10 MG tablet Take 1 tablet (10 mg total) by mouth daily. 07/14/23  Yes Crist Fat, MD  ezetimibe (ZETIA) 10 MG tablet TAKE ONE TABLET BY MOUTH EVERY EVENING 09/21/23  Yes Crist Fat, MD  memantine [facility]) 5 MG tablet Take 1 tablet (5 mg at night) for 2 weeks, then increase to 1 tablet (5 mg) twice a day 04/17/23  Yes Gwynneth Munson, Sung Amabile, PA-C  metoprolol tartrate (LOPRESSOR) 50 MG tablet TAKE ONE TABLET BY MOUTH TWICE DAILY 09/21/23  Yes Crist Fat, MD  omeprazole (PRILOSEC) 20 MG capsule TAKE ONE CAPSULE BY MOUTH EVERY MORNING 09/21/23  Yes Crist Fat, MD  acetaminophen (TYLENOL) 325 MG tablet Take 325 mg by mouth every 6 (six) hours as needed for moderate pain or headache.    [provider]  atenolol (TENORMIN) 25 MG tablet Take 12.5 mg by mouth daily.    [provider]  azelastine (ASTELIN) 0.1 % nasal spray Place 2 sprays into both nostrils 3 times/day as needed-between meals & bedtime for allergies. Use in each nostril as directed    [provider]  Cholecalciferol (VITAMIN D3) 125 MCG (5000 UT) CAPS Take 5,000 Units by mouth daily.    [provider]  fluticasone (FLONASE) 50 MCG/ACT nasal spray Place 1 spray into both nostrils in the morning and at bedtime. 08/13/23 08/12/24  Crist Fat, MD  latanoprost (XALATAN) 0.005 % ophthalmic solution Place 1 drop into both eyes at bedtime.     [provider]  meclizine (ANTIVERT) 12.5 MG tablet Take 1 tablet (12.5 mg total) by mouth 3 (three) times daily as needed for dizziness. 08/13/23   Crist Fat, MD  mirtazapine (REMERON) 15 MG tablet TAKE ONE TABLET BY MOUTH EVERY EVENING 09/21/23   Leonia Reader, Barbara Cower, MD  simvastatin (ZOCOR) 10 MG tablet Take 1 tablet (10 mg total) by mouth every other day. 12/03/22   [name], NP  Allergies    Statins and Aspirin    Review of Systems   Review of Systems  Constitutional:  Negative for fever.  Respiratory:  Negative for shortness of breath.   Cardiovascular:  Negative for chest pain.  Gastrointestinal:  Negative for abdominal pain, diarrhea and vomiting.  Skin:  Negative for rash.  Neurological:  Negative for facial asymmetry, speech difficulty, weakness, numbness and headaches.  Psychiatric/Behavioral:  Positive for confusion, hallucinations and sleep disturbance. Negative for self-injury and suicidal ideas.     Physical Exam Updated Vital Signs BP (!) 98/55   Pulse (!) 122    Temp 98.1 F (36.7 C)   Resp (!) 25   Ht 5\' 8"  (1.727 m)   Wt 68 kg   SpO2 96%   BMI 22.79 kg/m  Physical Exam Vitals and nursing note reviewed.  Constitutional:      Appearance: Normal appearance.  HENT:     Head: Normocephalic.     Mouth/Throat:     Mouth: Mucous membranes are moist.  Cardiovascular:     Rate and Rhythm: Normal rate.  Pulmonary:     Effort: Pulmonary effort is normal. No respiratory distress.  Abdominal:     Palpations: Abdomen is soft.     Tenderness: There is no abdominal tenderness.  Skin:    General: Skin is warm.  Neurological:     General: No focal deficit present.     Mental Status: He is alert and oriented to person, place, and time. Mental status is at baseline.     Cranial Nerves: No cranial nerve deficit.  Psychiatric:        Mood and Affect: Mood normal.     ED Results / Procedures / Treatments   Labs (all labs ordered are listed, but only abnormal results are displayed) Labs Reviewed  COMPREHENSIVE METABOLIC PANEL - Abnormal; Notable for the following components:      Result Value   Glucose, Bld 130 (*)    Creatinine, Ser 1.32 (*)    GFR, Estimated 53 (*)    All other components within normal limits  URINALYSIS, ROUTINE W REFLEX MICROSCOPIC - Abnormal; Notable for the following components:   Hgb urine dipstick MODERATE (*)    Protein, ur TRACE (*)    All other components within normal limits  CBG MONITORING, ED - Abnormal; Notable for the following components:   Glucose-Capillary 125 (*)    All other components within normal limits  RESP PANEL BY RT-PCR (RSV, FLU A&B, COVID)  [id number]  CBC    EKG EKG Interpretation Date/Time:  Saturday September 26 2023 13:43:55 EST Ventricular Rate:  116 PR Interval:  131 QRS Duration:  132 QT Interval:  353 QTC Calculation: 491 R Axis:   -34  Text Interpretation: Sinus tachycardia Right bundle branch block ST elevation, consider inferior injury 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 RADIATION DOSE REDUCTION: This exam was performed according to the departmental dose-optimization program which includes automated exposure control, adjustment of the mA and/or kV according to patient size and/or use of iterative reconstruction technique. COMPARISON:  04/02/2023 FINDINGS: Brain: No evidence of acute infarction, hemorrhage, hydrocephalus, or extra-axial collection. Stable partially calcified left lateral posterior fossa meningioma measuring approximately 1.7 cm. No appreciable  mass effect or edema the adjacent cerebellum. Scattered low-density changes within the periventricular and subcortical white matter most compatible with chronic microvascular ischemic change. Mild diffuse cerebral volume loss. Vascular: No hyperdense vessel or unexpected calcification. Skull: Normal. Negative for fracture or focal lesion. Sinuses/Orbits: No acute finding. Other: None. IMPRESSION: 1. No acute intracranial findings. 2. Stable partially calcified left lateral posterior fossa meningioma measuring approximately 1.7 cm. 3. Chronic microvascular ischemic change and cerebral volume loss. Electronically Signed   By: Duanne Guess D.O.   On: 09/26/2023 14:37    Procedures Procedures    Medications Ordered in ED Medications  sodium chloride 0.9 % bolus 1,000 mL (1,000 mLs Intravenous New Bag/Given 09/26/23 1424)    ED Course/ Medical Decision Making/ A&P                                 Medical Decision Making Amount and/or Complexity of Data Reviewed Labs: ordered. Radiology: ordered.   89 year old male presents emergency department with worsening hallucinations.  Accompanied by daughter at bedside.  He does  have a history of confusion, dementia at baseline.  Has had daytime hallucinations in the past related to infection.  He is tachycardic on arrival, afebrile.  Oriented x 3 but at times confused and needs his daughter to aid in history giving.  He is nonfocal on his neuroexam.  Blood work is reassuring, there is mild elevation in creatinine going along with patient's suspicion of dehydration and tachycardia.  It is sinus tachycardia on EKG.  Will plan for IV hydration.  Otherwise no findings of UTI or metabolic abnormality, respiratory panel is negative.  CT of the head is unremarkable.  On reevaluation since being here the patient is having more frequent hallucinations.  Most recently he is all tigers walking across the ceiling and another time he saw another advertisement going on on the wall.  With the increased frequency of these hallucinations I consulted neurology, Dr. Iver Nestle.  Although this seems unlikely to be primarily neurologic she feels with the worsening frequency of the hallucinations it warrants an emergent MRI.  Will plan to transfer ER to ER to [facility] to get MRI.  There is a small chance that this could be related to some sort of epileptic disease but less likely, there is also consideration of a psychiatric component or with worsening dementia.  She recommends transferring for MRI and continued monitoring for further evaluation.  Patient and daughter agree with this.  Dr. Jeraldine Loots has accepted the patient and patient is stable at time of transfer.        Final Clinical Impression(s) / ED Diagnoses Final diagnoses:  Hallucinations    Rx / DC Orders ED Discharge Orders     None         Rozelle Logan, DO 09/26/23 1530

## 2023-09-26 NOTE — ED Triage Notes (Signed)
Pt sent here from [facility]  for MRI.  Has hx of dementia, but recently has been hallucinating.  While at [facility] was seeing tigers on the ceiling.  Presently calm.  Hr remains in 120's.

## 2023-09-26 NOTE — ED Notes (Signed)
 Patient transported to X-ray

## 2023-09-26 NOTE — ED Triage Notes (Addendum)
Arrives with complaints of respiratory symptoms x3 days, but he is now having hallucinations (seeing deceased spouse in his home).  Patient tested negative for covid/flu at home and he has not had a fever.  -accompanied by his daughter who states that patient does have some dementia at baseline, but he prefers not to have it discussed.

## 2023-09-26 NOTE — Consult Note (Signed)
Cardiology Consultation   Patient ID: Jason Farmer MRN: [id number]; DOB: October 26, 1934  Admit date: 09/26/2023 Date of Consult: 09/26/2023  PCP:  Crist Fat, MD   [facility] Providers Cardiologist:  Charlton Haws, MD   { Click here to update MD or APP on Care Team, Refresh:1}     Patient Profile:   Jason Farmer is a 89 y.o. male with a hx of accessible atrial fibrillation, hypertension, transient ischemic attack, hyperlipidemia, and coronary artery disease who is being seen 09/26/2023 for the evaluation of arrhythmia at the request of the Hospitalist Service.  History of Present Illness:   Jason Farmer ***  Farmer was initially diagnosed with atrial fibrillation during a hospital admission on 11/12/2022.  Initially presented with dizziness.  He was found to be in atrial fibrillation with rapid ventricular rates.  Seen by cardiology.  He was started on Eliquis for stroke reduction, he was also initiated on metoprolol 50 mg twice daily.   Past Medical History:  Diagnosis Date   Allergy    Anxiety    "not a problem for a long time" (11/27/2016)   Arthritis    "wrists, knees, shoulders, back" (11/27/2016)   Chronic lower back pain    "5th disc is protruding" (11/27/2016)   Essential tremor    GERD (gastroesophageal reflux disease)    Glaucoma    Heart murmur    dx'd in [employer] in the 1960s; haven't been seen for it since "(11/27/2016)   Hepatitis ~ 1958   "jaundice kind"   HOH (hard of hearing)    Hyperlipemia    Migraine    "none since my neck OR" (11/27/2016)   Seasonal allergies    TIA (transient ischemic attack) 04/2015   Wears glasses    Wears hearing aid     Past Surgical History:  Procedure Laterality Date   ANTERIOR CERVICAL DECOMP/DISCECTOMY FUSION  2007   APPENDECTOMY     BACK SURGERY     CARPAL TUNNEL RELEASE Right 10/12/2013   Procedure: RIGHT CARPAL TUNNEL RELEASE;  Surgeon: Nicki Reaper, MD;  Location: [facility];  Service:  Orthopedics;  Laterality: Right;   CATARACT EXTRACTION W/ INTRAOCULAR LENS  IMPLANT, BILATERAL Bilateral    COLONOSCOPY     HEMORRHOID SURGERY     KNEE ARTHROSCOPY Left 1970s   NASAL SEPTOPLASTY W/ TURBINOPLASTY Bilateral 02/15/2013   Procedure: NASAL SEPTOPLASTY WITH BILATER TURBINATE REDUCTION;  Surgeon: Darletta Moll, MD;  Location: [facility];  Service: ENT;  Laterality: Bilateral;   TONSILLECTOMY     TRIGGER FINGER RELEASE  06/08/2012   Procedure: RELEASE TRIGGER FINGER/A-1 PULLEY;  Surgeon: Nicki Reaper, MD;  Location: [facility];  Service: Orthopedics;  Laterality: Left;  Release A-1 Pulley Left Long Finger   TRIGGER FINGER RELEASE  07/14/2012   Procedure: RELEASE TRIGGER FINGER/A-1 PULLEY;  Surgeon: Nicki Reaper, MD;  Location: [facility];  Service: Orthopedics;  Laterality: Right;   TRIGGER FINGER RELEASE Right 10/12/2013   Procedure: RELEASE TRIGGER FINGER/A-1 PULLEY RIGHT MIDDLE FINGER;  Surgeon: Nicki Reaper, MD;  Location: [facility];  Service: Orthopedics;  Laterality: Right;   UPPER GASTROINTESTINAL ENDOSCOPY       Home Medications:  Prior to Admission medications   Medication Sig Start Date End Date Taking? Authorizing Provider  acetaminophen (TYLENOL) 325 MG tablet Take 325 mg by mouth every 6 (six) hours as needed for moderate pain or headache.  Yes [provider]  ELIQUIS 5 MG TABS tablet TAKE ONE TABLET BY MOUTH TWICE DAILY 09/21/23  Yes Crist Fat, MD  escitalopram (LEXAPRO) 10 MG tablet Take 1 tablet (10 mg total) by mouth daily. 07/14/23  Yes Crist Fat, MD  ezetimibe (ZETIA) 10 MG tablet TAKE ONE TABLET BY MOUTH EVERY EVENING 09/21/23  Yes Crist Fat, MD  fluticasone [facility]) 50 MCG/ACT nasal spray Place 1 spray into both nostrils in the morning and at bedtime. Patient taking differently: Place 1 spray into both nostrils daily as needed for allergies. 08/13/23 08/12/24 Yes Crist Fat, MD   latanoprost (XALATAN) 0.005 % ophthalmic solution Place 1 drop into both eyes at bedtime.    Yes [provider]  meclizine (ANTIVERT) 12.5 MG tablet Take 1 tablet (12.5 mg total) by mouth 3 (three) times daily as needed for dizziness. 08/13/23  Yes Crist Fat, MD  memantine (NAMENDA) 5 MG tablet Take 1 tablet (5 mg at night) for 2 weeks, then increase to 1 tablet (5 mg) twice a day Patient taking differently: Take 5 mg by mouth 2 (two) times daily. 04/17/23  Yes Marcos Eke, PA-C  metoprolol tartrate (LOPRESSOR) 50 MG tablet TAKE ONE TABLET BY MOUTH TWICE DAILY 09/21/23  Yes Crist Fat, MD  mirtazapine (REMERON) 15 MG tablet TAKE ONE TABLET BY MOUTH EVERY EVENING 09/21/23  Yes Crist Fat, MD  omeprazole (PRILOSEC) 20 MG capsule TAKE ONE CAPSULE BY MOUTH EVERY MORNING 09/21/23  Yes Crist Fat, MD    Inpatient Medications: Scheduled Meds:  [START ON 09/27/2023] apixaban  5 mg Oral BID   [START ON 09/27/2023] escitalopram  10 mg Oral Daily   [START ON 09/27/2023] ezetimibe  10 mg Oral QPM   latanoprost  1 drop Both Eyes QHS   melatonin  6 mg Oral QHS   memantine  5 mg Oral BID   metoprolol tartrate  50 mg Oral BID   mirtazapine  7.5 mg Oral QHS   [START ON 09/27/2023] pantoprazole  40 mg Oral Daily   QUEtiapine  25 mg Oral QHS   sodium chloride flush  3 mL Intravenous Q12H   Continuous Infusions:  PRN Meds: acetaminophen, polyethylene glycol  Allergies:    Allergies  Allergen Reactions   Statins Other (See Comments)    Muscle cramps    Aspirin Other (See Comments)    bleeding    Social History:   Social History   Socioeconomic History   Marital status: Married    Spouse name: Not on file   Number of children: 4   Years of education: Not on file   Highest education level: Some college, no degree  Occupational History   Not on file  Tobacco Use   Smoking status: Former    Current packs/day: 0.00    Average packs/day: 2.0 packs/day for 30.0 years  (60.0 ttl pk-yrs)    Types: Cigarettes    Start date: 06/03/1954    Quit date: 06/03/1984    Years since quitting: 39.3   Smokeless tobacco: Never   Tobacco comments:    Former smoker (had AAA at the [place]; also noted 2018 on Dr. Fabio Bering note) 05/01/23  Vaping Use   Vaping status: Never Used  Substance and Sexual Activity   Alcohol use: Never    Alcohol/week: 1.0 standard drink of alcohol    Types: 1 Cans of beer per week   Drug use: No   Sexual activity: Not Currently  Other Topics Concern   Not on file  Social History Narrative   Retired from [employer]   Married    4 children, one son lives locally   Lives with wife   retired   Chief Executive Officer Drivers of [employer]: Low Risk  (03/23/2023)   Overall Financial Resource Strain (CARDIA)    Difficulty of Paying Living Expenses: Not hard at all  Food Insecurity: No Food Insecurity (04/06/2023)   Hunger Vital Sign    Worried About Running Out of Food in the Last Year: Never true    Ran Out of Food in the Last Year: Never true  Transportation Needs: No Transportation Needs (04/06/2023)   PRAPARE - Administrator, Civil Service (Medical): No    Lack of Transportation (Non-Medical): No  Physical Activity: Insufficiently Active (03/23/2023)   Exercise Vital Sign    Days of Exercise per Week: 3 days    Minutes of Exercise per Session: 40 min  Stress: No Stress Concern Present (03/23/2023)   [employer] of Occupational Health - Occupational Stress Questionnaire    Feeling of Stress : Not at all  Social Connections: Moderately Isolated (03/23/2023)   Social Connection and Isolation Panel [NHANES]    Frequency of Communication with Friends and Family: More than three times a week    Frequency of Social Gatherings with Friends and Family: More than three times a week    Attends Religious Services: Never    Database administrator or Organizations: No    Attends Banker Meetings: Never     Marital Status: Married  Catering manager Violence: Not At Risk (03/23/2023)   Humiliation, Afraid, Rape, and Kick questionnaire    Fear of Current or Ex-Partner: No    Emotionally Abused: No    Physically Abused: No    Sexually Abused: No    Family History:    Family History  Problem Relation Age of Onset   Glaucoma Mother    Dementia Father    Heart failure Father    Colon cancer Neg Hx    Rectal cancer Neg Hx    Stomach cancer Neg Hx    Esophageal cancer Neg Hx    Colon polyps Neg Hx      ROS:  Please see the history of present illness.   All other ROS reviewed and negative.     Physical Exam/Data:   Vitals:   09/26/23 2230 09/26/23 2245 09/26/23 2312 09/26/23 2330  BP: 121/79   121/79  Pulse: 60 (!) 124 (!) 121 (!) 124  Resp: 20 16 12 14   Temp:      TempSrc:      SpO2: 99% 99% 97% 97%  Weight:      Height:       No intake or output data in the 24 hours ending 09/26/23 2340    09/26/2023    4:58 PM 09/26/2023   12:01 PM 08/13/2023    3:34 PM  Last 3 Weights  Weight (lbs) 149 lb 14.6 oz 149 lb 14.6 oz 150 lb  Weight (kg) 68 kg 68 kg 68.04 kg     Body mass index is 22.79 kg/m.  General:  Well nourished, well developed, in no acute distress*** HEENT: normal Neck: no JVD Vascular: No carotid bruits; Distal pulses 2+ bilaterally Cardiac:  normal S1, S2; RRR; no murmur *** Lungs:  clear to auscultation bilaterally, no wheezing, rhonchi or rales  Abd: soft, nontender, no hepatomegaly  Ext: no edema Musculoskeletal:  No deformities, BUE and BLE strength normal and equal Skin: warm and dry  Neuro:  CNs 2-12 intact, no focal abnormalities noted Psych:  Normal affect   EKG:  The EKG was personally reviewed and demonstrates:  SVT with right bundle branch block  Telemetry:  Telemetry was personally reviewed and demonstrates:  SVT with RBBB morphology  Relevant CV Studies:  11/13/2022  IMPRESSIONS     1. Left ventricular ejection fraction, by estimation, is  55 to 60%. The  left ventricle has normal function. The left ventricle has no regional  wall motion abnormalities. Left ventricular diastolic parameters are  consistent with Grade II diastolic  dysfunction (pseudonormalization).   2. Right ventricular systolic function is normal. The right ventricular  size is mildly enlarged. There is normal pulmonary artery systolic  pressure.   3. Left atrial size was mildly dilated.   4. Right atrial size was mildly dilated.   5. The mitral valve is normal in structure. No evidence of mitral valve  regurgitation. No evidence of mitral stenosis.   6. Tricuspid valve regurgitation is moderate.   7. The aortic valve is normal in structure. Aortic valve regurgitation is  not visualized. No aortic stenosis is present.   8. The inferior vena cava is normal in size with greater than 50%  respiratory variability, suggesting right atrial pressure of 3 mmHg.   FINDINGS   Left Ventricle: Left ventricular ejection fraction, by estimation, is 55  to 60%. The left ventricle has normal function. The left ventricle has no  regional wall motion abnormalities. The left ventricular internal cavity  size was normal in size. There is   no left ventricular hypertrophy. Left ventricular diastolic parameters  are consistent with Grade II diastolic dysfunction (pseudonormalization).   Right Ventricle: The right ventricular size is mildly enlarged. No  increase in right ventricular wall thickness. Right ventricular systolic  function is normal. There is normal pulmonary artery systolic pressure.  The tricuspid regurgitant velocity is 2.48   m/s, and with an assumed right atrial pressure of 3 mmHg, the estimated  right ventricular systolic pressure is 27.6 mmHg.   Left Atrium: Left atrial size was mildly dilated.   Right Atrium: Right atrial size was mildly dilated.   Pericardium: There is no evidence of pericardial effusion.   Mitral Valve: The mitral valve is  normal in structure. Mild mitral annular  calcification. No evidence of mitral valve regurgitation. No evidence of  mitral valve stenosis.   Tricuspid Valve: The tricuspid valve is normal in structure. Tricuspid  valve regurgitation is moderate . No evidence of tricuspid stenosis.   Aortic Valve: The aortic valve is normal in structure. Aortic valve  regurgitation is not visualized. No aortic stenosis is present. Aortic  valve mean gradient measures 4.0 mmHg. Aortic valve peak gradient measures  8.5 mmHg. Aortic valve area, by VTI  measures 1.99 cm.   Pulmonic Valve: The pulmonic valve was normal in structure. Pulmonic valve  regurgitation is trivial. No evidence of pulmonic stenosis.   Aorta: The aortic root is normal in size and structure.   Venous: The inferior vena cava is normal in size with greater than 50%  respiratory variability, suggesting right atrial pressure of 3 mmHg.   IAS/Shunts: No atrial level shunt detected by color flow Doppler.   11/28/2016 NM   Study Result  Narrative & Impression    There was no ST segment deviation noted during stress. This is a low risk study.  Nuclear stress EF: 64%. No T wave inversion was noted during stress.   No reversible ischemia. LVEF 64% with normal wall motion. This is a low risk study.      Laboratory Data:  High Sensitivity Troponin:  No results for input(s): "TROPONINIHS" in the last 720 hours.   Chemistry Recent Labs  Lab 09/26/23 1250  NA 136  K 4.4  CL 104  CO2 25  GLUCOSE 130*  BUN 16  CREATININE 1.32*  CALCIUM 9.3  GFRNONAA 53*  ANIONGAP 7    Recent Labs  Lab 09/26/23 1250  PROT 7.1  ALBUMIN 3.8  AST 18  ALT 11  ALKPHOS 69  BILITOT 0.6   Lipids No results for input(s): "CHOL", "TRIG", "HDL", "LABVLDL", "LDLCALC", "CHOLHDL" in the last 168 hours.  Hematology Recent Labs  Lab 09/26/23 1250  WBC 5.7  RBC 4.84  HGB 14.6  HCT 43.5  MCV 89.9  MCH 30.2  MCHC 33.6  RDW 13.3  PLT 257    Thyroid No results for input(s): "TSH", "FREET4" in the last 168 hours.  BNPNo results for input(s): "BNP", "PROBNP" in the last 168 hours.  DDimer No results for input(s): "DDIMER" in the last 168 hours.   Radiology/Studies:  MR BRAIN WO CONTRAST Result Date: 09/26/2023 CLINICAL DATA:  Mental status change, unknown cause. EXAM: MRI HEAD WITHOUT CONTRAST TECHNIQUE: Multiplanar, multiecho pulse sequences of the brain and surrounding structures were obtained without intravenous contrast. COMPARISON:  CT head from today.  MRI head February 16, 2019. FINDINGS: Brain: No acute infarction, hemorrhage, hydrocephalus, or extra-axial fluid collection. Similar extra-axial dural-based mass along the left lateral posterior fossa, approximately 18 mm and likely a meningioma. No significant mass effect or adjacent brain edema. Cerebral atrophy. Vascular: Major arterial flow voids are maintained at the skull base. Skull and upper cervical spine: Normal marrow signal. Sinuses/Orbits: Clear sinuses.  No acute orbital findings. Other: No mastoid effusions. IMPRESSION: 1. No acute abnormality. 2. Similar putative meningioma along the left lateral posterior fossa. No significant mass effect or adjacent brain edema. Electronically Signed   By: Feliberto Harts M.D.   On: 09/26/2023 19:47   DG Chest 2 View Result Date: 09/26/2023 CLINICAL DATA:  Cough. EXAM: CHEST - 2 VIEW COMPARISON:  Jan 08, 2023. FINDINGS: The heart size and mediastinal contours are within normal limits. Stable reticular densities are noted bilaterally most consistent with chronic interstitial lung disease or fibrosis. No definite acute abnormality is seen. The visualized skeletal structures are unremarkable. IMPRESSION: Stable chronic bilateral lung opacities are noted. No acute abnormality seen. Electronically Signed   By: Lupita Raider M.D.   On: 09/26/2023 18:54   CT Head Wo Contrast Result Date: 09/26/2023 CLINICAL DATA:  Mental status change,  unknown cause EXAM: CT HEAD WITHOUT CONTRAST TECHNIQUE: Contiguous axial images were obtained from the base of the skull through the vertex without intravenous contrast. RADIATION DOSE REDUCTION: This exam was performed according to the departmental dose-optimization program which includes automated exposure control, adjustment of the mA and/or kV according to patient size and/or use of iterative reconstruction technique. COMPARISON:  04/02/2023 FINDINGS: Brain: No evidence of acute infarction, hemorrhage, hydrocephalus, or extra-axial collection. Stable partially calcified left lateral posterior fossa meningioma measuring approximately 1.7 cm. No appreciable mass effect or edema the adjacent cerebellum. Scattered low-density changes within the periventricular and subcortical white matter most compatible with chronic microvascular ischemic change. Mild diffuse cerebral volume loss. Vascular: No hyperdense vessel or unexpected calcification. Skull: Normal. Negative for fracture  or focal lesion. Sinuses/Orbits: No acute finding. Other: None. IMPRESSION: 1. No acute intracranial findings. 2. Stable partially calcified left lateral posterior fossa meningioma measuring approximately 1.7 cm. 3. Chronic microvascular ischemic change and cerebral volume loss. Electronically Signed   By: Duanne Guess D.O.   On: 09/26/2023 14:37     Assessment and Plan:     ***   Risk Assessment/Risk Scores:  {Complete the following score calculators/questions to meet required metrics.  Press F2         :[id number]}   {Is the patient being seen for unstable angina, ACS, NSTEMI or STEMI?:[phone]} {Does this patient have CHF or CHF symptoms?      :[id number]} {Does this patient have ATRIAL FIBRILLATION?[phone]}  {Are we signing off today?:[id number]}  For questions or updates, please contact [facility] Please consult [email or web address] for contact info under    Signed, Donavan Burnet, MD  09/26/2023 11:40  PM

## 2023-09-26 NOTE — ED Notes (Signed)
Patient in MRI when this RN took over care.

## 2023-09-26 NOTE — ED Notes (Signed)
Called [facility] for Ed to Ed tx via [facility]; Gerhard Munch, MD as accepting physician

## 2023-09-27 ENCOUNTER — Observation Stay (HOSPITAL_COMMUNITY): Payer: Medicare Other

## 2023-09-27 DIAGNOSIS — I2510 Atherosclerotic heart disease of native coronary artery without angina pectoris: Secondary | ICD-10-CM | POA: Diagnosis present

## 2023-09-27 DIAGNOSIS — J069 Acute upper respiratory infection, unspecified: Secondary | ICD-10-CM | POA: Diagnosis not present

## 2023-09-27 DIAGNOSIS — Z7901 Long term (current) use of anticoagulants: Secondary | ICD-10-CM | POA: Diagnosis not present

## 2023-09-27 DIAGNOSIS — Z79899 Other long term (current) drug therapy: Secondary | ICD-10-CM | POA: Diagnosis not present

## 2023-09-27 DIAGNOSIS — K219 Gastro-esophageal reflux disease without esophagitis: Secondary | ICD-10-CM | POA: Diagnosis present

## 2023-09-27 DIAGNOSIS — R3129 Other microscopic hematuria: Secondary | ICD-10-CM | POA: Diagnosis present

## 2023-09-27 DIAGNOSIS — Z981 Arthrodesis status: Secondary | ICD-10-CM | POA: Diagnosis not present

## 2023-09-27 DIAGNOSIS — Z961 Presence of intraocular lens: Secondary | ICD-10-CM | POA: Diagnosis present

## 2023-09-27 DIAGNOSIS — I4891 Unspecified atrial fibrillation: Secondary | ICD-10-CM | POA: Diagnosis present

## 2023-09-27 DIAGNOSIS — Z888 Allergy status to other drugs, medicaments and biological substances status: Secondary | ICD-10-CM | POA: Diagnosis not present

## 2023-09-27 DIAGNOSIS — I129 Hypertensive chronic kidney disease with stage 1 through stage 4 chronic kidney disease, or unspecified chronic kidney disease: Secondary | ICD-10-CM | POA: Diagnosis present

## 2023-09-27 DIAGNOSIS — D6869 Other thrombophilia: Secondary | ICD-10-CM | POA: Diagnosis present

## 2023-09-27 DIAGNOSIS — Z974 Presence of external hearing-aid: Secondary | ICD-10-CM | POA: Diagnosis not present

## 2023-09-27 DIAGNOSIS — Z8249 Family history of ischemic heart disease and other diseases of the circulatory system: Secondary | ICD-10-CM | POA: Diagnosis not present

## 2023-09-27 DIAGNOSIS — Z87891 Personal history of nicotine dependence: Secondary | ICD-10-CM | POA: Diagnosis not present

## 2023-09-27 DIAGNOSIS — N1832 Chronic kidney disease, stage 3b: Secondary | ICD-10-CM | POA: Diagnosis present

## 2023-09-27 DIAGNOSIS — R001 Bradycardia, unspecified: Secondary | ICD-10-CM | POA: Diagnosis present

## 2023-09-27 DIAGNOSIS — R443 Hallucinations, unspecified: Secondary | ICD-10-CM

## 2023-09-27 DIAGNOSIS — Z886 Allergy status to analgesic agent status: Secondary | ICD-10-CM | POA: Diagnosis not present

## 2023-09-27 DIAGNOSIS — H409 Unspecified glaucoma: Secondary | ICD-10-CM | POA: Diagnosis present

## 2023-09-27 DIAGNOSIS — E785 Hyperlipidemia, unspecified: Secondary | ICD-10-CM | POA: Diagnosis present

## 2023-09-27 DIAGNOSIS — I4892 Unspecified atrial flutter: Secondary | ICD-10-CM | POA: Diagnosis present

## 2023-09-27 DIAGNOSIS — Z8673 Personal history of transient ischemic attack (TIA), and cerebral infarction without residual deficits: Secondary | ICD-10-CM | POA: Diagnosis not present

## 2023-09-27 DIAGNOSIS — I4510 Unspecified right bundle-branch block: Secondary | ICD-10-CM | POA: Diagnosis present

## 2023-09-27 DIAGNOSIS — Z1152 Encounter for screening for COVID-19: Secondary | ICD-10-CM | POA: Diagnosis not present

## 2023-09-27 DIAGNOSIS — F0392 Unspecified dementia, unspecified severity, with psychotic disturbance: Secondary | ICD-10-CM | POA: Diagnosis present

## 2023-09-27 LAB — PHOSPHORUS: Phosphorus: 3.3 mg/dL (ref 2.5–4.6)

## 2023-09-27 LAB — BASIC METABOLIC PANEL
Anion gap: 9 (ref 5–15)
BUN: 13 mg/dL (ref 8–23)
CO2: 21 mmol/L — ABNORMAL LOW (ref 22–32)
Calcium: 8.7 mg/dL — ABNORMAL LOW (ref 8.9–10.3)
Chloride: 106 mmol/L (ref 98–111)
Creatinine, Ser: 1.46 mg/dL — ABNORMAL HIGH (ref 0.61–1.24)
GFR, Estimated: 47 mL/min — ABNORMAL LOW (ref >60)
Glucose, Bld: 117 mg/dL — ABNORMAL HIGH (ref 70–99)
Potassium: 4.4 mmol/L (ref 3.5–5.1)
Sodium: 136 mmol/L (ref 135–145)

## 2023-09-27 LAB — ECHOCARDIOGRAM COMPLETE BUBBLE STUDY
AR max vel: 2.34 cm2
AV Area VTI: 2.76 cm2
AV Area mean vel: 2.47 cm2
AV Mean grad: 4 mm[Hg]
AV Peak grad: 7.2 mm[Hg]
Ao pk vel: 1.34 m/s
Area-P 1/2: 3.6 cm2
P 1/2 time: 533 ms
S' Lateral: 2.4 cm

## 2023-09-27 LAB — TSH: TSH: 2.191 u[IU]/mL (ref 0.350–4.500)

## 2023-09-27 LAB — MAGNESIUM: Magnesium: 2 mg/dL (ref 1.7–2.4)

## 2023-09-27 LAB — BRAIN NATRIURETIC PEPTIDE: B Natriuretic Peptide: 241.4 pg/mL — ABNORMAL HIGH (ref 0.0–100.0)

## 2023-09-27 MED ORDER — AMIODARONE HCL IN DEXTROSE 360-4.14 MG/200ML-% IV SOLN: 60.0000 mg/h | INTRAVENOUS | Status: DC

## 2023-09-27 MED ORDER — AMIODARONE LOAD VIA INFUSION
150.0000 mg | Freq: Once | INTRAVENOUS | Status: AC
  Administered 2023-09-27: 150 mg via INTRAVENOUS
  Filled 2023-09-27: qty 83.34

## 2023-09-27 MED ORDER — AMIODARONE HCL IN DEXTROSE 360-4.14 MG/200ML-% IV SOLN
60.0000 mg/h | INTRAVENOUS | Status: AC
Start: 2023-09-27 — End: 2023-09-27
  Administered 2023-09-27 (×2): 60 mg/h via INTRAVENOUS
  Filled 2023-09-27: qty 200

## 2023-09-27 MED ORDER — AMIODARONE HCL IN DEXTROSE 360-4.14 MG/200ML-% IV SOLN
30.0000 mg/h | INTRAVENOUS | Status: DC
  Administered 2023-09-27 – 2023-09-28 (×2): 30 mg/h via INTRAVENOUS
  Filled 2023-09-27 (×2): qty 200

## 2023-09-27 MED ORDER — AMIODARONE LOAD VIA INFUSION
150.0000 mg | Freq: Once | INTRAVENOUS | Status: DC
  Filled 2023-09-27: qty 83.34

## 2023-09-27 MED ORDER — SODIUM CHLORIDE 0.9 % IV BOLUS
500.0000 mL | Freq: Once | INTRAVENOUS | Status: AC
  Administered 2023-09-27: 500 mL via INTRAVENOUS

## 2023-09-27 MED ORDER — AMIODARONE HCL IN DEXTROSE 360-4.14 MG/200ML-% IV SOLN: 30.0000 mg/h | INTRAVENOUS | Status: DC

## 2023-09-27 MED ORDER — AMIODARONE IV BOLUS ONLY 150 MG/100ML
150.0000 mg | Freq: Once | INTRAVENOUS | Status: AC
  Administered 2023-09-27: 150 mg via INTRAVENOUS
  Filled 2023-09-27: qty 100

## 2023-09-27 MED ORDER — AMIODARONE HCL 200 MG PO TABS
400.0000 mg | ORAL_TABLET | Freq: Three times a day (TID) | ORAL | Status: DC
  Administered 2023-09-27: 400 mg via ORAL
  Filled 2023-09-27: qty 2

## 2023-09-27 NOTE — Progress Notes (Signed)
[facility] PROGRESS NOTE   ERASMUS BISTLINE VWU:[id number] DOB: [birth year] DOA: 09/26/2023  PCP: Crist Fat, MD  Brief History: 89 y.o. male with hx of dementia, A-fib on anticoagulation, coronary disease by imaging, hypertension, hyperlipidemia, esophageal stricture, gastritis, who was transferred ED to ED from [facility] due to acute onset of hallucinations, and recommendation for MRI evaluation.  Per report that patient seeing form hallucinations like writing on the wall, people in the room, ships going out to see, a tiger walking around. Symptoms began abruptly on Wed 1/29.  He was also noted to be in atrial flutter with RVR and was cardioverted in the emergency department.  Consultants: Cardiology.  Phone discussion with neurology  Procedures: Bedside DC cardioversion by EDP    Subjective/Interval History: Patient is pleasantly confused.  Denies any chest pain or shortness of breath.  Does not appear to be any acute distress or discomfort.    Assessment/Plan:  Hallucinations in the setting of history of dementia and behavioral changes Patient's case was discussed with neurology who recommended MRI.  MRI did not show any acute findings.  Stable meningioma was noted.  Again discussed with neurology who opined that this could be possible Lewy body dementia and recommended trial of Seroquel at night and to decrease the dose of mirtazapine.  Patient can follow-up with neurology in the outpatient setting. Continue with Seroquel.  Dose of mirtazapine has been decreased. Reorient daily.  He is also noted to be on memantine which is being continued.  Atrial flutter with RVR He is status post DC cardioversion in the ED. Cardiology is following the patient.  He has been started on amiodarone loading dose. Continue to monitor on telemetry. He is also noted to be on apixaban and metoprolol.  Elevated creatinine Likely chronic kidney disease stage IIIb Monitor renal function closely.   Monitor urine output.  Coronary artery disease by imaging Continue statin.  Seems to be asymptomatic.  Essential hypertension Monitor blood pressure closely.  Microscopic hematuria Outpatient management.  DVT Prophylaxis: On apixaban Code Status: Full code Family Communication: No family at bedside Disposition Plan: Hopefully return home when improved.  PT and OT evaluation.  Status is: Observation The patient will require care spanning > 2 midnights and should be moved to inpatient because: Atrial flutter, low blood pressure      Medications: Scheduled:  amiodarone  400 mg Oral Q8H   apixaban  5 mg Oral BID   escitalopram  10 mg Oral Daily   ezetimibe  10 mg Oral QPM   latanoprost  1 drop Both Eyes QHS   melatonin  6 mg Oral QHS   memantine  5 mg Oral BID   metoprolol tartrate  50 mg Oral BID   mirtazapine  7.5 mg Oral QHS   pantoprazole  40 mg Oral Daily   QUEtiapine  25 mg Oral QHS   sodium chloride flush  3 mL Intravenous Q12H   Continuous: GNF:[id number], polyethylene glycol  Antibiotics: Anti-infectives (From admission, onward)    None       Objective:  Vital Signs  Vitals:   09/27/23 0145 09/27/23 0416 09/27/23 0502 09/27/23 0801  BP:   (!) 94/50 104/64  Pulse: 87  (!) 58 (!) 59  Resp: 16  19 17   Temp:  98.3 F (36.8 C)  98.1 F (36.7 C)  TempSrc:  Oral  Oral  SpO2: 93%  95% 95%  Weight:      Height:  No intake or output data in the 24 hours ending 09/27/23 0948 Filed Weights   09/26/23 1201 09/26/23 1658  Weight: 68 kg 68 kg    General appearance: Awake alert.  In no distress.  Distracted Resp: Clear to auscultation bilaterally.  Normal effort Cardio: S1-S2 is a little irregular GI: Abdomen is soft.  Nontender nondistended.  Bowel sounds are present normal.  No masses organomegaly Extremities: No edema.  Full range of motion of lower extremities. Neurologic:  No focal neurological deficits.    Lab Results:  Data  Reviewed: I have personally reviewed following labs and reports of the imaging studies  CBC: Recent Labs  Lab 09/26/23 1250  WBC 5.7  HGB 14.6  HCT 43.5  MCV 89.9  PLT 257    Basic Metabolic Panel: Recent Labs  Lab 09/26/23 1250 09/27/23 0450  NA 136 136  K 4.4 4.4  CL 104 106  CO2 25 21*  GLUCOSE 130* 117*  BUN 16 13  CREATININE 1.32* 1.46*  CALCIUM 9.3 8.7*  MG  --  2.0  PHOS  --  3.3    GFR: Estimated Creatinine Clearance: 34.9 mL/min (A) (by C-G formula based on SCr of 1.46 mg/dL (H)).  Liver Function Tests: Recent Labs  Lab 09/26/23 1250  AST 18  ALT 11  ALKPHOS 69  BILITOT 0.6  PROT 7.1  ALBUMIN 3.8    CBG: Recent Labs  Lab 09/26/23 1340  GLUCAP 125*    Thyroid Function Tests: Recent Labs    09/27/23 0450  TSH 2.191    Recent Results (from the past 240 hours)  Resp panel by RT-PCR (RSV, Flu A&B, Covid) Anterior Nasal Swab     Status: None   Collection Time: 09/26/23  1:42 PM   Specimen: Anterior Nasal Swab  Result Value Ref Range Status   SARS Coronavirus 2 by RT PCR NEGATIVE NEGATIVE Final    Comment: (NOTE) SARS-CoV-2 target nucleic acids are NOT DETECTED.  The SARS-CoV-2 RNA is generally detectable in upper respiratory specimens during the acute phase of infection. The lowest concentration of SARS-CoV-2 viral copies this assay can detect is 138 copies/mL. A negative result does not preclude SARS-Cov-2 infection and should not be used as the sole basis for treatment or other patient management decisions. A negative result may occur with  improper specimen collection/handling, submission of specimen other than nasopharyngeal swab, presence of viral mutation(s) within the areas targeted by this assay, and inadequate number of viral copies(<138 copies/mL). A negative result must be combined with clinical observations, patient history, and epidemiological information. The expected result is Negative.  Fact Sheet for Patients:   BloggerCourse.com  Fact Sheet for Healthcare Providers:  SeriousBroker.it  This test is no t yet approved or cleared by the [place] FDA and  has been authorized for detection and/or diagnosis of SARS-CoV-2 by FDA under an Emergency Use Authorization (EUA). This EUA will remain  in effect (meaning this test can be used) for the duration of the COVID-19 declaration under Section 564(b)(1) of the Act, 21 U.S.C.section 360bbb-3(b)(1), unless the authorization is terminated  or revoked sooner.       Influenza A by PCR NEGATIVE NEGATIVE Final   Influenza B by PCR NEGATIVE NEGATIVE Final    Comment: (NOTE) The Xpert Xpress SARS-CoV-2/FLU/RSV plus assay is intended as an aid in the diagnosis of influenza from Nasopharyngeal swab specimens and should not be used as a sole basis for treatment. Nasal washings and aspirates are unacceptable for Xpert  Xpress SARS-CoV-2/FLU/RSV testing.  Fact Sheet for Patients: BloggerCourse.com  Fact Sheet for Healthcare Providers: SeriousBroker.it  This test is not yet approved or cleared by the [place] FDA and has been authorized for detection and/or diagnosis of SARS-CoV-2 by FDA under an Emergency Use Authorization (EUA). This EUA will remain in effect (meaning this test can be used) for the duration of the COVID-19 declaration under Section 564(b)(1) of the Act, 21 U.S.C. section 360bbb-3(b)(1), unless the authorization is terminated or revoked.     Resp Syncytial Virus by PCR NEGATIVE NEGATIVE Final    Comment: (NOTE) Fact Sheet for Patients: BloggerCourse.com  Fact Sheet for Healthcare Providers: SeriousBroker.it  This test is not yet approved or cleared by the [place] FDA and has been authorized for detection and/or diagnosis of SARS-CoV-2 by FDA under an Emergency Use  Authorization (EUA). This EUA will remain in effect (meaning this test can be used) for the duration of the COVID-19 declaration under Section 564(b)(1) of the Act, 21 U.S.C. section 360bbb-3(b)(1), unless the authorization is terminated or revoked.  Performed at [facility], [address], [place], [place] [address]       Radiology Studies: MR BRAIN WO CONTRAST Result Date: 09/26/2023 CLINICAL DATA:  Mental status change, unknown cause. EXAM: MRI HEAD WITHOUT CONTRAST TECHNIQUE: Multiplanar, multiecho pulse sequences of the brain and surrounding structures were obtained without intravenous contrast. COMPARISON:  CT head from today.  MRI head February 16, 2019. FINDINGS: Brain: No acute infarction, hemorrhage, hydrocephalus, or extra-axial fluid collection. Similar extra-axial dural-based mass along the left lateral posterior fossa, approximately 18 mm and likely a meningioma. No significant mass effect or adjacent brain edema. Cerebral atrophy. Vascular: Major arterial flow voids are maintained at the skull base. Skull and upper cervical spine: Normal marrow signal. Sinuses/Orbits: Clear sinuses.  No acute orbital findings. Other: No mastoid effusions. IMPRESSION: 1. No acute abnormality. 2. Similar putative meningioma along the left lateral posterior fossa. No significant mass effect or adjacent brain edema. Electronically Signed   By: Feliberto Harts M.D.   On: 09/26/2023 19:47   DG Chest 2 View Result Date: 09/26/2023 CLINICAL DATA:  Cough. EXAM: CHEST - 2 VIEW COMPARISON:  Jan 08, 2023. FINDINGS: The heart size and mediastinal contours are within normal limits. Stable reticular densities are noted bilaterally most consistent with chronic interstitial lung disease or fibrosis. No definite acute abnormality is seen. The visualized skeletal structures are unremarkable. IMPRESSION: Stable chronic bilateral lung opacities are noted. No acute abnormality seen. Electronically Signed    By: Lupita Raider M.D.   On: 09/26/2023 18:54   CT Head Wo Contrast Result Date: 09/26/2023 CLINICAL DATA:  Mental status change, unknown cause EXAM: CT HEAD WITHOUT CONTRAST TECHNIQUE: Contiguous axial images were obtained from the base of the skull through the vertex without intravenous contrast. RADIATION DOSE REDUCTION: This exam was performed according to the departmental dose-optimization program which includes automated exposure control, adjustment of the mA and/or kV according to patient size and/or use of iterative reconstruction technique. COMPARISON:  04/02/2023 FINDINGS: Brain: No evidence of acute infarction, hemorrhage, hydrocephalus, or extra-axial collection. Stable partially calcified left lateral posterior fossa meningioma measuring approximately 1.7 cm. No appreciable mass effect or edema the adjacent cerebellum. Scattered low-density changes within the periventricular and subcortical white matter most compatible with chronic microvascular ischemic change. Mild diffuse cerebral volume loss. Vascular: No hyperdense vessel or unexpected calcification. Skull: Normal. Negative for fracture or focal lesion. Sinuses/Orbits: No acute finding. Other: None. IMPRESSION:  1. No acute intracranial findings. 2. Stable partially calcified left lateral posterior fossa meningioma measuring approximately 1.7 cm. 3. Chronic microvascular ischemic change and cerebral volume loss. Electronically Signed   By: Duanne Guess D.O.   On: 09/26/2023 14:37       LOS: 0 days   Maame Dack Rito Ehrlich  [facility] Pager on [email or web address]  09/27/2023, 9:48 AM

## 2023-09-27 NOTE — ED Notes (Signed)
Pt ripped out iv and pulled off leads. This RN entered room, patient keeps saying doctor has came into talk to him and said he was being released. Informed pt that no one has stopped by his room to discharge him. Care ongoing

## 2023-09-27 NOTE — Progress Notes (Signed)
   Patient Name: Jason Farmer Date of Encounter: 09/27/2023 [facility] Cardiologist: Charlton Haws, MD   Interval Summary  .    No acute overnight events. Having paroxysms of AFL. Asymptomatic. His most pressing concern is visual hallucinations.   Vital Signs .    Vitals:   09/27/23 0416 09/27/23 0502 09/27/23 0801 09/27/23 1130  BP:  (!) 94/50 104/64 119/84  Pulse:  (!) 58 (!) 59 (!) 117  Resp:  19 17 15   Temp: 98.3 F (36.8 C)  98.1 F (36.7 C)   TempSrc: Oral  Oral   SpO2:  95% 95% 95%  Weight:      Height:       No intake or output data in the 24 hours ending 09/27/23 1140    09/26/2023    4:58 PM 09/26/2023   12:01 PM 08/13/2023    3:34 PM  Last 3 Weights  Weight (lbs) 149 lb 14.6 oz 149 lb 14.6 oz 150 lb  Weight (kg) 68 kg 68 kg 68.04 kg      Telemetry/ECG    Frequent paroxysms of AFL - Personally Reviewed  Physical Exam .   GEN: No acute distress.   Neck: No JVD Cardiac: Tachycardic, regular. Respiratory:Normal work of breathing. GI: Soft, non-distended  MS: No edema  Assessment & Plan .     Jason Farmer is a 89 y.o. male with a hx of accessible atrial fibrillation, hypertension, transient ischemic attack, hyperlipidemia, and coronary artery disease who is being seen 09/26/2023 for the evaluation of arrhythmia at the request of the Hospitalist Service.   #. Atrial flutter, unspecified: Still having frequent paroxysms.  #. Secondary hypercoagulable state due to atrial flutter: #. RBBB - IV amiodarone for 24 hours. Then transition to oral 400mg  BID x 1 week, 400mg  daily x 1 week, then 200mg  once daily thereafter.  - Zio monitor on discharge.  - Continue Eliquis. - Continue metoprolol for now, I suspect that once consistently maintaining sinus we may need to decrease metoprolol based on heart rates.  - Echocardiogram pending.  For questions or updates, please contact [facility] Please consult [email or web address] for contact info under         Signed, Nobie Putnam, MD

## 2023-09-27 NOTE — ED Notes (Signed)
Hospitalist Segars at the bedside

## 2023-09-27 NOTE — ED Notes (Signed)
Hold amiodarone at this time as pt HR is ~70bpm.

## 2023-09-27 NOTE — ED Notes (Signed)
 Cardiology at the bedside.

## 2023-09-27 NOTE — H&P (Addendum)
History and Physical    Jason Farmer WUJ:[id number] DOB: 02-16-1935 DOA: 09/26/2023  PCP: Crist Fat, MD   Patient coming from: ED -> ED Tx from [facility]   Chief Complaint:  Chief Complaint  Patient presents with   Hallucinations   Fatigue    HPI: History is provided by patient's daughter due to patient's dementia Jason Farmer is a 89 y.o. male with hx of dementia, A-fib on anticoagulation, coronary disease by imaging, hypertension, hyperlipidemia, esophageal stricture, gastritis, who was transferred ED to ED from [facility] due to acute onset of hallucinations, and recommendation for MRI evaluation.  Per report that patient seeing form hallucinations like writing on the wall, people in the room, ships going out to see, a tiger walking around. Symptoms began abruptly on Wed 1/29. He otherwise had recent old like illness with hoarseness, increased congestion, but no cough / SOB and symptoms have improved. Also had abd pain without other GI symptoms. No recent fall to knowledge. No other recent illness, no changes in medications recently.   Otherwise not able to tell when he is in afib, no chest pain.    Review of Systems:  ROS complete and negative except as marked above   Allergies  Allergen Reactions   Statins Other (See Comments)    Muscle cramps    Aspirin Other (See Comments)    bleeding    Prior to Admission medications   Medication Sig Start Date End Date Taking? Authorizing Provider  acetaminophen (TYLENOL) 325 MG tablet Take 325 mg by mouth every 6 (six) hours as needed for moderate pain or headache.   Yes [provider]  ELIQUIS 5 MG TABS tablet TAKE ONE TABLET BY MOUTH TWICE DAILY 09/21/23  Yes Crist Fat, MD  escitalopram (LEXAPRO) 10 MG tablet Take 1 tablet (10 mg total) by mouth daily. 07/14/23  Yes Crist Fat, MD  ezetimibe (ZETIA) 10 MG tablet TAKE ONE TABLET BY MOUTH EVERY EVENING 09/21/23  Yes Crist Fat, MD  fluticasone [facility]) 50  MCG/ACT nasal spray Place 1 spray into both nostrils in the morning and at bedtime. Patient taking differently: Place 1 spray into both nostrils daily as needed for allergies. 08/13/23 08/12/24 Yes Crist Fat, MD  latanoprost (XALATAN) 0.005 % ophthalmic solution Place 1 drop into both eyes at bedtime.    Yes [provider]  meclizine (ANTIVERT) 12.5 MG tablet Take 1 tablet (12.5 mg total) by mouth 3 (three) times daily as needed for dizziness. 08/13/23  Yes Crist Fat, MD  memantine (NAMENDA) 5 MG tablet Take 1 tablet (5 mg at night) for 2 weeks, then increase to 1 tablet (5 mg) twice a day Patient taking differently: Take 5 mg by mouth 2 (two) times daily. 04/17/23  Yes Marcos Eke, PA-C  metoprolol tartrate (LOPRESSOR) 50 MG tablet TAKE ONE TABLET BY MOUTH TWICE DAILY 09/21/23  Yes Crist Fat, MD  mirtazapine (REMERON) 15 MG tablet TAKE ONE TABLET BY MOUTH EVERY EVENING 09/21/23  Yes Crist Fat, MD  omeprazole (PRILOSEC) 20 MG capsule TAKE ONE CAPSULE BY MOUTH EVERY MORNING 09/21/23  Yes Crist Fat, MD    Past Medical History:  Diagnosis Date   Allergy    Anxiety    "not a problem for a long time" (11/27/2016)   Arthritis    "wrists, knees, shoulders, back" (11/27/2016)   Chronic lower back pain    "5th disc is protruding" (11/27/2016)   Essential tremor  GERD (gastroesophageal reflux disease)    Glaucoma    Heart murmur    dx'd in [employer] in the 1960s; haven't been seen for it since "(11/27/2016)   Hepatitis ~ 1958   "jaundice kind"   HOH (hard of hearing)    Hyperlipemia    Migraine    "none since my neck OR" (11/27/2016)   Seasonal allergies    TIA (transient ischemic attack) 04/2015   Wears glasses    Wears hearing aid     Past Surgical History:  Procedure Laterality Date   ANTERIOR CERVICAL DECOMP/DISCECTOMY FUSION  2007   APPENDECTOMY     BACK SURGERY     CARPAL TUNNEL RELEASE Right 10/12/2013   Procedure: RIGHT CARPAL TUNNEL RELEASE;  Surgeon:  Nicki Reaper, MD;  Location: [facility];  Service: Orthopedics;  Laterality: Right;   CATARACT EXTRACTION W/ INTRAOCULAR LENS  IMPLANT, BILATERAL Bilateral    COLONOSCOPY     HEMORRHOID SURGERY     KNEE ARTHROSCOPY Left 1970s   NASAL SEPTOPLASTY W/ TURBINOPLASTY Bilateral 02/15/2013   Procedure: NASAL SEPTOPLASTY WITH BILATER TURBINATE REDUCTION;  Surgeon: Darletta Moll, MD;  Location: [facility];  Service: ENT;  Laterality: Bilateral;   TONSILLECTOMY     TRIGGER FINGER RELEASE  06/08/2012   Procedure: RELEASE TRIGGER FINGER/A-1 PULLEY;  Surgeon: Nicki Reaper, MD;  Location: [facility];  Service: Orthopedics;  Laterality: Left;  Release A-1 Pulley Left Long Finger   TRIGGER FINGER RELEASE  07/14/2012   Procedure: RELEASE TRIGGER FINGER/A-1 PULLEY;  Surgeon: Nicki Reaper, MD;  Location: [facility];  Service: Orthopedics;  Laterality: Right;   TRIGGER FINGER RELEASE Right 10/12/2013   Procedure: RELEASE TRIGGER FINGER/A-1 PULLEY RIGHT MIDDLE FINGER;  Surgeon: Nicki Reaper, MD;  Location: [facility];  Service: Orthopedics;  Laterality: Right;   UPPER GASTROINTESTINAL ENDOSCOPY       reports that he quit smoking about 39 years ago. His smoking use included cigarettes. He started smoking about 69 years ago. He has a 60 pack-year smoking history. He has never used smokeless tobacco. He reports that he does not drink alcohol and does not use drugs.  Family History  Problem Relation Age of Onset   Glaucoma Mother    Dementia Father    Heart failure Father    Colon cancer Neg Hx    Rectal cancer Neg Hx    Stomach cancer Neg Hx    Esophageal cancer Neg Hx    Colon polyps Neg Hx      Physical Exam: Vitals:   09/26/23 2230 09/26/23 2245 09/26/23 2312 09/26/23 2330  BP: 121/79   121/79  Pulse: 60 (!) 124 (!) 121 (!) 124  Resp: 20 16 12 14   Temp:      TempSrc:      SpO2: 99% 99% 97% 97%  Weight:      Height:         Gen: Awake, alert, Elderly, well nourished   CV: Tachycardic, irregular, normal S1, S2, no murmurs  Resp: Normal WOB, coarse breath sounds  Abd: Flat, normoactive, nontender MSK: Symmetric, no edema  Skin: No rashes or lesions to exposed skin  Neuro: Alert and interactive, attention intact, forgetful. Fully oriented, no aphasia. CN II-XII intact. Motor 5/5 and symmetric, sensation is intact and equal to fine touch. No clonus at ankles  Psych: euthymic, calm, appropriate    Data review:   Labs reviewed, notable for:  Chemistries blood counts unremarkable UA not consistent with infection, positive microhematuria  Micro:  Results for orders placed or performed during the hospital encounter of 09/26/23  Resp panel by RT-PCR (RSV, Flu A&B, Covid) Anterior Nasal Swab     Status: None   Collection Time: 09/26/23  1:42 PM   Specimen: Anterior Nasal Swab  Result Value Ref Range Status   SARS Coronavirus 2 by RT PCR NEGATIVE NEGATIVE Final    Comment: (NOTE) SARS-CoV-2 target nucleic acids are NOT DETECTED.  The SARS-CoV-2 RNA is generally detectable in upper respiratory specimens during the acute phase of infection. The lowest concentration of SARS-CoV-2 viral copies this assay can detect is 138 copies/mL. A negative result does not preclude SARS-Cov-2 infection and should not be used as the sole basis for treatment or other patient management decisions. A negative result may occur with  improper specimen collection/handling, submission of specimen other than nasopharyngeal swab, presence of viral mutation(s) within the areas targeted by this assay, and inadequate number of viral copies(<138 copies/mL). A negative result must be combined with clinical observations, patient history, and epidemiological information. The expected result is Negative.  Fact Sheet for Patients:  BloggerCourse.com  Fact Sheet for Healthcare Providers:   SeriousBroker.it  This test is no t yet approved or cleared by the [place] FDA and  has been authorized for detection and/or diagnosis of SARS-CoV-2 by FDA under an Emergency Use Authorization (EUA). This EUA will remain  in effect (meaning this test can be used) for the duration of the COVID-19 declaration under Section 564(b)(1) of the Act, 21 U.S.C.section 360bbb-3(b)(1), unless the authorization is terminated  or revoked sooner.       Influenza A by PCR NEGATIVE NEGATIVE Final   Influenza B by PCR NEGATIVE NEGATIVE Final    Comment: (NOTE) The Xpert Xpress SARS-CoV-2/FLU/RSV plus assay is intended as an aid in the diagnosis of influenza from Nasopharyngeal swab specimens and should not be used as a sole basis for treatment. Nasal washings and aspirates are unacceptable for Xpert Xpress SARS-CoV-2/FLU/RSV testing.  Fact Sheet for Patients: BloggerCourse.com  Fact Sheet for Healthcare Providers: SeriousBroker.it  This test is not yet approved or cleared by the [place] FDA and has been authorized for detection and/or diagnosis of SARS-CoV-2 by FDA under an Emergency Use Authorization (EUA). This EUA will remain in effect (meaning this test can be used) for the duration of the COVID-19 declaration under Section 564(b)(1) of the Act, 21 U.S.C. section 360bbb-3(b)(1), unless the authorization is terminated or revoked.     Resp Syncytial Virus by PCR NEGATIVE NEGATIVE Final    Comment: (NOTE) Fact Sheet for Patients: BloggerCourse.com  Fact Sheet for Healthcare Providers: SeriousBroker.it  This test is not yet approved or cleared by the [place] FDA and has been authorized for detection and/or diagnosis of SARS-CoV-2 by FDA under an Emergency Use Authorization (EUA). This EUA will remain in effect (meaning this test can be used) for  the duration of the COVID-19 declaration under Section 564(b)(1) of the Act, 21 U.S.C. section 360bbb-3(b)(1), unless the authorization is terminated or revoked.  Performed at [facility], [address] [place], [place] [address]     Imaging reviewed:  MR BRAIN WO CONTRAST Result Date: 09/26/2023 CLINICAL DATA:  Mental status change, unknown cause. EXAM: MRI HEAD WITHOUT CONTRAST TECHNIQUE: Multiplanar, multiecho pulse sequences of the brain and surrounding structures were obtained without intravenous contrast. COMPARISON:  CT head from today.  MRI head February 16, 2019.  FINDINGS: Brain: No acute infarction, hemorrhage, hydrocephalus, or extra-axial fluid collection. Similar extra-axial dural-based mass along the left lateral posterior fossa, approximately 18 mm and likely a meningioma. No significant mass effect or adjacent brain edema. Cerebral atrophy. Vascular: Major arterial flow voids are maintained at the skull base. Skull and upper cervical spine: Normal marrow signal. Sinuses/Orbits: Clear sinuses.  No acute orbital findings. Other: No mastoid effusions. IMPRESSION: 1. No acute abnormality. 2. Similar putative meningioma along the left lateral posterior fossa. No significant mass effect or adjacent brain edema. Electronically Signed   By: Feliberto Harts M.D.   On: 09/26/2023 19:47   DG Chest 2 View Result Date: 09/26/2023 CLINICAL DATA:  Cough. EXAM: CHEST - 2 VIEW COMPARISON:  Jan 08, 2023. FINDINGS: The heart size and mediastinal contours are within normal limits. Stable reticular densities are noted bilaterally most consistent with chronic interstitial lung disease or fibrosis. No definite acute abnormality is seen. The visualized skeletal structures are unremarkable. IMPRESSION: Stable chronic bilateral lung opacities are noted. No acute abnormality seen. Electronically Signed   By: Lupita Raider M.D.   On: 09/26/2023 18:54   CT Head Wo Contrast Result Date:  09/26/2023 CLINICAL DATA:  Mental status change, unknown cause EXAM: CT HEAD WITHOUT CONTRAST TECHNIQUE: Contiguous axial images were obtained from the base of the skull through the vertex without intravenous contrast. RADIATION DOSE REDUCTION: This exam was performed according to the departmental dose-optimization program which includes automated exposure control, adjustment of the mA and/or kV according to patient size and/or use of iterative reconstruction technique. COMPARISON:  04/02/2023 FINDINGS: Brain: No evidence of acute infarction, hemorrhage, hydrocephalus, or extra-axial collection. Stable partially calcified left lateral posterior fossa meningioma measuring approximately 1.7 cm. No appreciable mass effect or edema the adjacent cerebellum. Scattered low-density changes within the periventricular and subcortical white matter most compatible with chronic microvascular ischemic change. Mild diffuse cerebral volume loss. Vascular: No hyperdense vessel or unexpected calcification. Skull: Normal. Negative for fracture or focal lesion. Sinuses/Orbits: No acute finding. Other: None. IMPRESSION: 1. No acute intracranial findings. 2. Stable partially calcified left lateral posterior fossa meningioma measuring approximately 1.7 cm. 3. Chronic microvascular ischemic change and cerebral volume loss. Electronically Signed   By: Duanne Guess D.O.   On: 09/26/2023 14:37    EKG:  Reviewed SVT most recent 131. ?  Organized atrial activity /notched P wave, no clear flutter waves on EKG. RBBB, no acute ischemic changes  Telemetry: Review of telemetry reveals underlying rhythm a flutter with occasional slow flutter  ED Course:  At [facility] ED her case was discussed with neurology Dr. Iver Nestle who recommended for transfer to [facility] for evaluation including MRI brain. MRI was obtained which shows stable left posterior fossa meningioma without complications.  EDP discussed with neurology Dr. Randal Buba who feels  that symptoms may be related to possible Lewy body dementia.  Recommending for trial of Seroquel at night and decrease in mirtazapine dosing.  Recommending follow-up with neurology outpatient.  Otherwise found to have a flutter with RVR, since he had not missed doses of his Eliquis underwent DCCV which ultimately was unsuccessful with return to a flutter and rates in the 120s to 130s.   Assessment/Plan:  89 y.o. male with hx dementia, A-fib on anticoagulation, coronary disease by imaging, hypertension, hyperlipidemia, esophageal stricture, gastritis, who was transferred ED to ED from [facility] due to acute onset of hallucinations, and recommendation for MRI evaluation.  MRI with stable left posterior fossa meningioma no acute findings.  Onset hallucinations possibly related to underlying Lewy body dementia.  Otherwise found to have a flutter with RVR s/p DCCV in the ED.  Hallucinations History of dementia with behavioral changes At [facility] ED her case was discussed with neurology Dr. Iver Nestle who recommended for transfer to [facility] for evaluation including MRI brain. MRI was obtained which shows stable left posterior fossa meningioma without complications.  EDP discussed with neurology Dr. Randal Buba who feels that symptoms may be related to possible Lewy body dementia.  Recommending for trial of Seroquel at night and decrease in mirtazapine dosing.  Recommending follow-up with neurology outpatient. Other consideration would be onset of hallucinations in setting of recent likely viral URI syndrome, however does not display signs of delirium currently.  - Trial Seroquel 25 mg nightly - Reduce mirtazapine to 7.5 mg nightly, further reduction per outpatient providers - Continue home memantine - Delirium precautions, fall precautions  A flutter with RVR s/p DCCV in the ED Although EKG without definite flutter waves, review of telemetry demonstrates occasional slow flutter.  Post DCCV return today flutter with  rates in the 120s to 130s. - Cardiology consulted, appreciate recommendations - Start Amiodarone 150 mg IV followed by PO loading with 400 mg TID until 6-10 g then can switch to 200 mg daily  -- Continue Metoprolol 50 mg BID  - Continue his home Eliquis  Recent URI, likely viral  Onset of URI symptoms, GI symptoms 1/29. Flu / COVID / RSV neg. Symptoms have improved at time of admission.  - Droplet precautions.  - Hold off on additional testing as symptoms resolved   Incidental findings: Microscopic hematuria: Consider CT urography if within goals of care.  Repeat UA outpatient  Chronic medical problems: CAD by imaging; HLD: Not on antiplatelet agent, continue home Zetia, simvastatin Hypertension: See rate control agents above Esophageal stricture; gastritis: Sub home PPI.  Outpatient GI follow-up Mood disorder: Continue home escitalopram    Body mass index is 22.79 kg/m.    DVT prophylaxis:  Eliquis Code Status:  Full Code Diet:  Diet Orders (From admission, onward)     Start     Ordered   09/26/23 2309  Diet regular Room service appropriate? Yes; Fluid consistency: Thin  Diet effective now       Question Answer Comment  Room service appropriate? Yes   Fluid consistency: Thin      09/26/23 2318           Family Communication:  Yes discussed with daughter at bedside Consults: Cardiology Admission status:   Observation, Telemetry bed  Severity of Illness: The appropriate patient status for this patient is OBSERVATION. Observation status is judged to be reasonable and necessary in order to provide the required intensity of service to ensure the patient's safety. The patient's presenting symptoms, physical exam findings, and initial radiographic and laboratory data in the context of their medical condition is felt to place them at decreased risk for further clinical deterioration. Furthermore, it is anticipated that the patient will be medically stable for discharge from  the hospital within 2 midnights of admission.    Dolly Rias, MD [facility]  How to contact the [facility] Attending or Consulting provider 7A - 7P or covering provider during after hours 7P -7A, for this patient.  Check the care team in [facility] and look for a) attending/consulting [facility] provider listed and b) the [facility] team listed Log into [email or web address] and use [facility] universal password to access. If you do not have the password,  please contact the hospital operator. Locate the [facility] provider you are looking for under [facility] and page to a number that you can be directly reached. If you still have difficulty reaching the provider, please page the [facility] (Director on Call) for the Hospitalists listed on amion for assistance.  09/27/2023, 12:07 AM

## 2023-09-28 ENCOUNTER — Other Ambulatory Visit: Payer: Self-pay | Admitting: Cardiology

## 2023-09-28 ENCOUNTER — Inpatient Hospital Stay (INDEPENDENT_AMBULATORY_CARE_PROVIDER_SITE_OTHER): Payer: Medicare Other

## 2023-09-28 DIAGNOSIS — I4891 Unspecified atrial fibrillation: Secondary | ICD-10-CM | POA: Diagnosis not present

## 2023-09-28 DIAGNOSIS — I4892 Unspecified atrial flutter: Secondary | ICD-10-CM

## 2023-09-28 DIAGNOSIS — R443 Hallucinations, unspecified: Secondary | ICD-10-CM | POA: Diagnosis not present

## 2023-09-28 LAB — CBC
HCT: 40 % (ref 39.0–52.0)
Hemoglobin: 13.4 g/dL (ref 13.0–17.0)
MCH: 30.3 pg (ref 26.0–34.0)
MCHC: 33.5 g/dL (ref 30.0–36.0)
MCV: 90.5 fL (ref 80.0–100.0)
Platelets: 239 10*3/uL (ref 150–400)
RBC: 4.42 MIL/uL (ref 4.22–5.81)
RDW: 13.2 % (ref 11.5–15.5)
WBC: 6.1 10*3/uL (ref 4.0–10.5)
nRBC: 0 % (ref 0.0–0.2)

## 2023-09-28 LAB — BASIC METABOLIC PANEL
Anion gap: 10 (ref 5–15)
BUN: 13 mg/dL (ref 8–23)
CO2: 20 mmol/L — ABNORMAL LOW (ref 22–32)
Calcium: 8.6 mg/dL — ABNORMAL LOW (ref 8.9–10.3)
Chloride: 106 mmol/L (ref 98–111)
Creatinine, Ser: 1.36 mg/dL — ABNORMAL HIGH (ref 0.61–1.24)
GFR, Estimated: 51 mL/min — ABNORMAL LOW (ref >60)
Glucose, Bld: 104 mg/dL — ABNORMAL HIGH (ref 70–99)
Potassium: 3.8 mmol/L (ref 3.5–5.1)
Sodium: 136 mmol/L (ref 135–145)

## 2023-09-28 MED ORDER — AMIODARONE HCL 200 MG PO TABS: ORAL_TABLET | ORAL | 0 refills | Status: DC

## 2023-09-28 MED ORDER — QUETIAPINE FUMARATE 25 MG PO TABS: 25.0000 mg | ORAL_TABLET | Freq: Every day | ORAL | 1 refills | Status: DC

## 2023-09-28 MED ORDER — MIRTAZAPINE 15 MG PO TABS: 7.5000 mg | ORAL_TABLET | Freq: Every evening | ORAL | Status: DC

## 2023-09-28 NOTE — Evaluation (Addendum)
Physical Therapy Evaluation and Discharge Patient Details Name: STACI CARVER MRN: [id number] DOB: [birth year] Today's Date: 09/28/2023  History of Present Illness  89 y.o. male with hx dementia, A-fib on anticoagulation, coronary disease by imaging, hypertension, hyperlipidemia, esophageal stricture, gastritis, who was transferred ED to ED from [facility] due to acute onset of hallucinations, and recommendation for MRI evaluation.  MRI with stable left posterior fossa meningioma no acute findings.  Onset hallucinations possibly related to underlying Lewy body dementia.  Otherwise found to have a flutter with RVR s/p DCCV in the ED.  Clinical Impression   Pt admitted with above diagnosis. Lives at home alone, in a single-level home with level entry; Lives in an ILF apartment;  Prior to admission, pt was able to manage independently, driving as well; Patient evaluated by Physical Therapy with no further acute PT needs identified. All education has been completed and the patient has no further questions. Walking well in the hallways; supportive daughters;  See below for any follow-up Physical Therapy or equipment needs. PT is signing off. Thank you for this referral.      If plan is discharge home, recommend the following: Assistance with cooking/housework   Can travel by private vehicle        Equipment Recommendations None recommended by PT  Recommendations for Other Services       Functional Status Assessment Patient has not had a recent decline in their functional status     Precautions / Restrictions Precautions Precautions: None Restrictions Weight Bearing Restrictions Per Provider Order: No      Mobility  Bed Mobility Overal bed mobility: Independent                  Transfers Overall transfer level: Independent                      Ambulation/Gait Ambulation/Gait assistance: Supervision, Independent Gait Distance (Feet): 300 Feet Assistive  device: IV Pole Gait Pattern/deviations: Step-through pattern, Decreased step length - right, Decreased step length - left       General Gait Details: No overt losses of balance and no overt difficulty; able to have a conversation while walking; difficulty alternating attention from conversation to monitoring room numbers  Stairs            Wheelchair Mobility     Tilt Bed    Modified Rankin (Stroke Patients Only)       Balance Overall balance assessment: Mild deficits observed, not formally tested                                           Pertinent Vitals/Pain Pain Assessment Pain Assessment: No/denies pain    Home Living Family/patient expects to be discharged to:: Private residence Living Arrangements: Alone Available Help at Discharge: Family;Available PRN/intermittently Type of Home: Independent living facility Home Access: Level entry       Home Layout: One level Home Equipment: None      Prior Function Prior Level of Function : Independent/Modified Independent                     Extremity/Trunk Assessment   Upper Extremity Assessment Upper Extremity Assessment: Overall WFL for tasks assessed    Lower Extremity Assessment Lower Extremity Assessment: Overall WFL for tasks assessed (for simple tasks)    Cervical / Trunk Assessment Cervical /  Trunk Assessment: Normal  Communication   Communication Communication: Hearing impairment Cueing Techniques: Verbal cues;Gestural cues;Visual cues  Cognition Arousal: Alert Behavior During Therapy: WFL for tasks assessed/performed Overall Cognitive Status: History of cognitive impairments - at baseline                                 General Comments: Known short-term memory deficits        General Comments General comments (skin integrity, edema, etc.): NAD on room air; Daughter, Byrd Hesselbach, present and helpful; VSS, reported to Caridiology PA    Exercises      Assessment/Plan    PT Assessment Patient does not need any further PT services  PT Problem List         PT Treatment Interventions      PT Goals (Current goals can be found in the Care Plan section)  Acute Rehab PT Goals Patient Stated Goal: Hopes to get home soon PT Goal Formulation: All assessment and education complete, DC therapy    Frequency       Co-evaluation               AM-PAC PT "6 Clicks" Mobility  Outcome Measure Help needed turning from your back to your side while in a flat bed without using bedrails?: None Help needed moving from lying on your back to sitting on the side of a flat bed without using bedrails?: None Help needed moving to and from a bed to a chair (including a wheelchair)?: None Help needed standing up from a chair using your arms (e.g., wheelchair or bedside chair)?: None Help needed to walk in hospital room?: None Help needed climbing 3-5 steps with a railing? : None 6 Click Score: 24    End of Session Equipment Utilized During Treatment: Gait belt Activity Tolerance: Patient tolerated treatment well Patient left: in chair;with call bell/phone within reach;with family/visitor present Nurse Communication: Mobility status PT Visit Diagnosis: Other symptoms and signs involving the nervous system (R29.898)    Time: 9604-5409 PT Time Calculation (min) (ACUTE ONLY): 29 min   Charges:   PT Evaluation $PT Eval Low Complexity: 1 Low PT Treatments $Gait Training: 8-22 mins PT General Charges $$ ACUTE PT VISIT: 1 Visit         Van Clines, PT  Acute Rehabilitation Services Office [phone] Secure Chat welcomed   Levi Aland 09/28/2023, 1:09 PM

## 2023-09-28 NOTE — Progress Notes (Unsigned)
Enrolled for [employer] to mail a ZIO XT long term holter monitor to the patients address on file.   Dr. Nishan to read. 

## 2023-09-28 NOTE — Progress Notes (Addendum)
Patient Name: Jason Farmer Date of Encounter: 09/28/2023 [facility] Cardiologist: Charlton Haws, MD   Interval Summary  .    Patient reports feeling well this morning. On my exam, had just returned from working with physical therapy. No acute cardiac complaints. Patient's primary concern this morning is cost of his Eliquis.   Vital Signs .    Vitals:   09/28/23 0504 09/28/23 0600 09/28/23 0800 09/28/23 0901  BP: 100/73 131/74  111/80  Pulse: (!) 57 (!) 53  63  Resp: 15 14  (!) 21  Temp:   97.8 F (36.6 C)   TempSrc:   Oral   SpO2: 100% 96%  100%  Weight:      Height:        Intake/Output Summary (Last 24 hours) at 09/28/2023 1040 Last data filed at 09/28/2023 0757 Gross per 24 hour  Intake --  Output 700 ml  Net -700 ml      09/26/2023    4:58 PM 09/26/2023   12:01 PM 08/13/2023    3:34 PM  Last 3 Weights  Weight (lbs) 149 lb 14.6 oz 149 lb 14.6 oz 150 lb  Weight (kg) 68 kg 68 kg 68.04 kg      Telemetry/ECG    Overnight telemetry with sinus rhythm/sinus bradycardia and paroxysmal atrial flutter - Personally Reviewed  Physical Exam .   GEN: No acute distress.   Neck: No JVD Cardiac: RRR, no murmurs, rubs, or gallops.  Respiratory: Clear to auscultation bilaterally. GI: Soft, nontender, non-distended  MS: No edema  Assessment & Plan .     Jason Farmer is a 89 y.o. male with a hx of accessible atrial fibrillation, hypertension, transient ischemic attack, hyperlipidemia, and coronary artery disease who is being seen for the evaluation of arrhythmia at the request of the Hospitalist Service.   Paroxysmal atrial flutter  Patient initially diagnosed with atrial fibrillation during a hospital admission on 11/12/2022. Initially presented with dizziness. He was found to be in atrial fibrillation with rapid ventricular rates. Seen by cardiology. He was started on Eliquis for stroke reduction, he was also initiated on metoprolol 50 mg twice daily. He  presented to the ED on 2/1 with acute concern of hallucinations but was noted to be in afib with RVR. He underwent DCCV in the ED this admission but had recurrent flutter with RVR prompting cardiology consult.  Patient has now been on IV Amiodarone x24 hours. Overnight telemetry with sinus rhythm/sinus bradycardia and paroxysmal flutter. TTE with preserved LVEF.   Transition to oral Amiodarone today, 400mg  BID x 1 week, 400mg  daily x 1 week, then 200mg  once daily thereafter. Will assess afib burden and rates with outpatient Zio Continue OAC. Unfortunately cost assistance for Eliquis and Xarelto very limited with no PartD coverage. These programs for Eliquis/xarelto require the patient to spend 3-4% of their annual income on pharmacy expenses from 1/1 before getting approved into the program. Pradaxa can be purchased at cash price/with GoodRx discount for between $65-$100. Given Eliquis price ~$400, would recommend switching patient to Pradaxa 150mg  BID.  Continue Metoprolol Tartrate 50mg  BID.    Hypertension  BP reasonably well controlled in inpatient/ED setting. Continue Metoprolol Tartrate 50mg  BID.   Hyperlipidemia  Continue home statin.   Per primary team:  CKD Hematuria Hallucinations  For questions or updates, please contact [facility] Please consult [email or web address] for contact info under        Signed, Perlie Gold, PA-C   Personally seen  and examined. Agree with above.  89 year old with confusion previously, atrial flutter paroxysmal, converted.  Regular rate and rhythm.  More coherent according to daughter. Feels well no chest pain no shortness of breath Lungs are clear  Heart rate in the 60s currently sitting in chair.  Sinus rhythm.  Telemetry personally reviewed and interpreted.  -Okay with transitioning to amiodarone as described above.  Continue with high risk medication management.  Monitor labs including TSH, LFTs as outpatient.  No pulmonary issues at  this time. -Needs chronic anticoagulation.  Cost is an issue.  We will trial Pradaxa 150 mg twice a day which could have a out-of-pocket cash cost between $60 and $75 per month.  Unfortunately Eliquis would be $400 per month.  Appreciate pharmacy team's assistance.  Our team is contacted them and is in correspondence. -I would be okay with him continuing with his current home dose of metoprolol tartrate which is 50 mg twice daily.  His heart rates are not excessively slow.  Discussed with daughter for clarification.  If heart rates do decrease significantly as outpatient, metoprolol can always be decreased. -We will also set him up as an outpatient with a ZIO monitor to look at atrial fibrillation burden. -He does take Namenda for memory impairment at home.  Okay with discharge from ED.  Donato Schultz, MD

## 2023-09-28 NOTE — Progress Notes (Signed)
[facility] PROGRESS NOTE   Jason Farmer ZOX:[id number] DOB: 06/24/1935 DOA: 09/26/2023  PCP: Crist Fat, MD  Brief History: 89 y.o. male with hx of dementia, A-fib on anticoagulation, coronary disease by imaging, hypertension, hyperlipidemia, esophageal stricture, gastritis, who was transferred ED to ED from [facility] due to acute onset of hallucinations, and recommendation for MRI evaluation.  Per report that patient seeing form hallucinations like writing on the wall, people in the room, ships going out to see, a tiger walking around. Symptoms began abruptly on Wed 1/29.  He was also noted to be in atrial flutter with RVR and was cardioverted in the emergency department.  Consultants: Cardiology.  Phone discussion with neurology  Procedures: Bedside DC cardioversion by EDP    Subjective/Interval History: Patient mentions that his hallucinations appear to have improved.  He denies any chest pain or shortness of breath this morning.  Frustrated about not having a bathroom close by.    Assessment/Plan:  Hallucinations in the setting of history of dementia and behavioral changes Patient's case was discussed with neurology who recommended MRI.  MRI did not show any acute findings.  Stable meningioma was noted.  Again discussed with neurology who opined that this could be possible Lewy body dementia and recommended trial of Seroquel at night and to decrease the dose of mirtazapine.  Patient can follow-up with neurology in the outpatient setting. Patient was started on Seroquel and dose of mirtazapine was decreased. Patient feels that his hallucinations are improving.  Will leave him on current dose of Seroquel for now. His daughter mentioned that he has an appointment coming up with his neurologist this month.  He is followed by [facility] neurology. He is also on memantine which is being continued.  Atrial flutter with RVR He is status post DC cardioversion in the ED. after  cardioversion he was still noted to have paroxysms of atrial flutter.  Started on amiodarone infusion by cardiology yesterday.  Noted to be in sinus bradycardia this morning.  Asymptomatic. He is on apixaban and metoprolol as well. Looks like amiodarone is supposed to infuse for 24 hours and then he is supposed to transition to 400 mg twice a day for 1 week followed by 40 mg once a day for 1 week and then 200 mg daily after that.  Cardiology is considering ZIO monitor on discharge. Echocardiogram shows EF of 60 to 65%. No pericardial effusion.  No significant wall motion abnormalities.  No diastolic dysfunction noted. Await cardiology input today.  Ambulate in the hallway.  Possible discharge later today if cleared by cardiology.  Elevated creatinine Likely chronic kidney disease stage IIIb Renal function is stable.  Monitor urine output.  Coronary artery disease by imaging Continue statin.  Seems to be asymptomatic.  Essential hypertension Borderline low blood pressure readings noted.  He is asymptomatic.  Currently just on metoprolol and amiodarone infusion as discussed above.  He was not on any other blood pressure lowering agents prior to admission.  Microscopic hematuria Outpatient management.  DVT Prophylaxis: On apixaban Code Status: Full code Family Communication: No family at bedside Disposition Plan: Ambulate in the hallway.  Await PT evaluation.  Hopefully discharge later today if cleared by cardiology.     Medications: Scheduled:  apixaban  5 mg Oral BID   escitalopram  10 mg Oral Daily   ezetimibe  10 mg Oral QPM   latanoprost  1 drop Both Eyes QHS   melatonin  6 mg Oral QHS   memantine  5 mg Oral BID   metoprolol tartrate  50 mg Oral BID   mirtazapine  7.5 mg Oral QHS   pantoprazole  40 mg Oral Daily   QUEtiapine  25 mg Oral QHS   sodium chloride flush  3 mL Intravenous Q12H   Continuous:  amiodarone 30 mg/hr (09/28/23 0153)   UEA:[id number], polyethylene  glycol    Objective:  Vital Signs  Vitals:   09/28/23 0504 09/28/23 0600 09/28/23 0800 09/28/23 0901  BP: 100/73 131/74  111/80  Pulse: (!) 57 (!) 53  63  Resp: 15 14  (!) 21  Temp:   97.8 F (36.6 C)   TempSrc:   Oral   SpO2: 100% 96%  100%  Weight:      Height:        Intake/Output Summary (Last 24 hours) at 09/28/2023 1002 Last data filed at 09/28/2023 0757 Gross per 24 hour  Intake --  Output 700 ml  Net -700 ml   Filed Weights   09/26/23 1201 09/26/23 1658  Weight: 68 kg 68 kg    General appearance: Awake alert.  In no distress Resp: Clear to auscultation bilaterally.  Normal effort Cardio: S1-S2 is normal regular.  No S3-S4.  No rubs murmurs or bruit GI: Abdomen is soft.  Nontender nondistended.  Bowel sounds are present normal.  No masses organomegaly Extremities: No edema.  Full range of motion of lower extremities. Neurologic: No focal neurological deficits.  Lab Results:  Data Reviewed: I have personally reviewed following labs and reports of the imaging studies  CBC: Recent Labs  Lab 09/26/23 1250 09/28/23 0400  WBC 5.7 6.1  HGB 14.6 13.4  HCT 43.5 40.0  MCV 89.9 90.5  PLT 257 239    Basic Metabolic Panel: Recent Labs  Lab 09/26/23 1250 09/27/23 0450 09/28/23 0400  NA 136 136 136  K 4.4 4.4 3.8  CL 104 106 106  CO2 25 21* 20*  GLUCOSE 130* 117* 104*  BUN 16 13 13   CREATININE 1.32* 1.46* 1.36*  CALCIUM 9.3 8.7* 8.6*  MG  --  2.0  --   PHOS  --  3.3  --     GFR: Estimated Creatinine Clearance: 37.5 mL/min (A) (by C-G formula based on SCr of 1.36 mg/dL (H)).  Liver Function Tests: Recent Labs  Lab 09/26/23 1250  AST 18  ALT 11  ALKPHOS 69  BILITOT 0.6  PROT 7.1  ALBUMIN 3.8    CBG: Recent Labs  Lab 09/26/23 1340  GLUCAP 125*    Thyroid Function Tests: Recent Labs    09/27/23 0450  TSH 2.191    Recent Results (from the past 240 hours)  Resp panel by RT-PCR (RSV, Flu A&B, Covid) Anterior Nasal Swab     Status:  None   Collection Time: 09/26/23  1:42 PM   Specimen: Anterior Nasal Swab  Result Value Ref Range Status   SARS Coronavirus 2 by RT PCR NEGATIVE NEGATIVE Final    Comment: (NOTE) SARS-CoV-2 target nucleic acids are NOT DETECTED.  The SARS-CoV-2 RNA is generally detectable in upper respiratory specimens during the acute phase of infection. The lowest concentration of SARS-CoV-2 viral copies this assay can detect is 138 copies/mL. A negative result does not preclude SARS-Cov-2 infection and should not be used as the sole basis for treatment or other patient management decisions. A negative result may occur with  improper specimen collection/handling, submission of specimen other than nasopharyngeal swab, presence of viral mutation(s) within the areas targeted  by this assay, and inadequate number of viral copies(<138 copies/mL). A negative result must be combined with clinical observations, patient history, and epidemiological information. The expected result is Negative.  Fact Sheet for Patients:  BloggerCourse.com  Fact Sheet for Healthcare Providers:  SeriousBroker.it  This test is no t yet approved or cleared by the [place] FDA and  has been authorized for detection and/or diagnosis of SARS-CoV-2 by FDA under an Emergency Use Authorization (EUA). This EUA will remain  in effect (meaning this test can be used) for the duration of the COVID-19 declaration under Section 564(b)(1) of the Act, 21 U.S.C.section 360bbb-3(b)(1), unless the authorization is terminated  or revoked sooner.       Influenza A by PCR NEGATIVE NEGATIVE Final   Influenza B by PCR NEGATIVE NEGATIVE Final    Comment: (NOTE) The Xpert Xpress SARS-CoV-2/FLU/RSV plus assay is intended as an aid in the diagnosis of influenza from Nasopharyngeal swab specimens and should not be used as a sole basis for treatment. Nasal washings and aspirates are unacceptable  for Xpert Xpress SARS-CoV-2/FLU/RSV testing.  Fact Sheet for Patients: BloggerCourse.com  Fact Sheet for Healthcare Providers: SeriousBroker.it  This test is not yet approved or cleared by the [place] FDA and has been authorized for detection and/or diagnosis of SARS-CoV-2 by FDA under an Emergency Use Authorization (EUA). This EUA will remain in effect (meaning this test can be used) for the duration of the COVID-19 declaration under Section 564(b)(1) of the Act, 21 U.S.C. section 360bbb-3(b)(1), unless the authorization is terminated or revoked.     Resp Syncytial Virus by PCR NEGATIVE NEGATIVE Final    Comment: (NOTE) Fact Sheet for Patients: BloggerCourse.com  Fact Sheet for Healthcare Providers: SeriousBroker.it  This test is not yet approved or cleared by the [place] FDA and has been authorized for detection and/or diagnosis of SARS-CoV-2 by FDA under an Emergency Use Authorization (EUA). This EUA will remain in effect (meaning this test can be used) for the duration of the COVID-19 declaration under Section 564(b)(1) of the Act, 21 U.S.C. section 360bbb-3(b)(1), unless the authorization is terminated or revoked.  Performed at [facility], [address], [place], [place] [address]       Radiology Studies: ECHOCARDIOGRAM COMPLETE BUBBLE STUDY Result Date: 09/27/2023    ECHOCARDIOGRAM REPORT   Patient Name:   RHONE OZAKI Robleto Date of Exam: 09/27/2023 Medical Rec #:  [id number]        Height:       68.0 in Accession #:    [id number]       Weight:       149.9 lb Date of Birth:  08-17-1935         BSA:          1.808 m Patient Age:    88 years         BP:           119/84 mmHg Patient Gender: M                HR:           68 bpm. Exam Location:  Inpatient Procedure: 2D Echo, Cardiac Doppler and Color Doppler Indications:    Atrial fibrillation  [facility])  History:        Patient has prior history of Echocardiogram examinations, most                 recent 11/13/2022. TIA.  Sonographer:    Darlys Gales Referring  Phys: [id number] DAMARCUS A INGRAM IMPRESSIONS  1. Left ventricular ejection fraction, by estimation, is 60 to 65%. The left ventricle has normal function. The left ventricle has no regional wall motion abnormalities. Left ventricular diastolic parameters are indeterminate. Elevated left ventricular end-diastolic pressure.  2. Right ventricular systolic function is normal. The right ventricular size is mildly enlarged.  3. Left atrial size was moderately dilated.  4. Right atrial size was moderately dilated.  5. The mitral valve is grossly normal. No evidence of mitral valve regurgitation. No evidence of mitral stenosis.  6. The aortic valve is tricuspid. Aortic valve regurgitation is mild. No aortic stenosis is present. Aortic regurgitation PHT measures 533 msec.  7. Agitated saline contrast bubble study was negative, with no evidence of any interatrial shunt. FINDINGS  Left Ventricle: Left ventricular ejection fraction, by estimation, is 60 to 65%. The left ventricle has normal function. The left ventricle has no regional wall motion abnormalities. The left ventricular internal cavity size was normal in size. There is  no left ventricular hypertrophy. Left ventricular diastolic parameters are indeterminate. Elevated left ventricular end-diastolic pressure. Right Ventricle: The right ventricular size is mildly enlarged. No increase in right ventricular wall thickness. Right ventricular systolic function is normal. Left Atrium: Left atrial size was moderately dilated. Right Atrium: Right atrial size was moderately dilated. Pericardium: There is no evidence of pericardial effusion. Mitral Valve: The mitral valve is grossly normal. Mild to moderate mitral annular calcification. No evidence of mitral valve regurgitation. No evidence of mitral valve stenosis.  Tricuspid Valve: The tricuspid valve is normal in structure. Tricuspid valve regurgitation is mild . No evidence of tricuspid stenosis. Aortic Valve: The aortic valve is tricuspid. Aortic valve regurgitation is mild. Aortic regurgitation PHT measures 533 msec. No aortic stenosis is present. Aortic valve mean gradient measures 4.0 mmHg. Aortic valve peak gradient measures 7.2 mmHg. Aortic  valve area, by VTI measures 2.76 cm. Pulmonic Valve: The pulmonic valve was normal in structure. Pulmonic valve regurgitation is mild to moderate. No evidence of pulmonic stenosis. Aorta: The aortic root and ascending aorta are structurally normal, with no evidence of dilitation. Venous: The inferior vena cava was not well visualized. IAS/Shunts: The interatrial septum was not assessed. Agitated saline contrast was given intravenously to evaluate for intracardiac shunting. Agitated saline contrast bubble study was negative, with no evidence of any interatrial shunt.  LEFT VENTRICLE PLAX 2D LVIDd:         4.20 cm   Diastology LVIDs:         2.40 cm   LV e' medial:    6.74 cm/s LV PW:         1.10 cm   LV E/e' medial:  17.5 LV IVS:        1.10 cm   LV e' lateral:   7.51 cm/s LVOT diam:     2.00 cm   LV E/e' lateral: 15.7 LV SV:         79 LV SV Index:   43 LVOT Area:     3.14 cm  RIGHT VENTRICLE RV S prime:     9.79 cm/s TAPSE (M-mode): 2.2 cm LEFT ATRIUM              Index        RIGHT ATRIUM           Index LA Vol (A2C):   110.0 ml 60.84 ml/m  RA Area:     20.50 cm LA Vol (A4C):   59.8  ml  33.07 ml/m  RA Volume:   63.70 ml  35.23 ml/m LA Biplane Vol: 81.7 ml  45.19 ml/m  AORTIC VALVE AV Area (Vmax):    2.34 cm AV Area (Vmean):   2.47 cm AV Area (VTI):     2.76 cm AV Vmax:           134.00 cm/s AV Vmean:          96.600 cm/s AV VTI:            0.285 m AV Peak Grad:      7.2 mmHg AV Mean Grad:      4.0 mmHg LVOT Vmax:         99.80 cm/s LVOT Vmean:        76.000 cm/s LVOT VTI:          0.250 m LVOT/AV VTI ratio: 0.88 AI  PHT:            533 msec  AORTA Ao Root diam: 3.50 cm Ao Asc diam:  3.20 cm MITRAL VALVE                TRICUSPID VALVE MV Area (PHT): 3.60 cm     TR Peak grad:   38.7 mmHg MV Decel Time: 211 msec     TR Vmax:        311.00 cm/s MV E velocity: 118.00 cm/s MV A velocity: 70.70 cm/s   SHUNTS MV E/A ratio:  1.67         Systemic VTI:  0.25 m                             Systemic Diam: 2.00 cm Vishnu Priya Mallipeddi Electronically signed by Winfield Rast Mallipeddi Signature Date/Time: 09/27/2023/4:41:47 PM    Final    MR BRAIN WO CONTRAST Result Date: 09/26/2023 CLINICAL DATA:  Mental status change, unknown cause. EXAM: MRI HEAD WITHOUT CONTRAST TECHNIQUE: Multiplanar, multiecho pulse sequences of the brain and surrounding structures were obtained without intravenous contrast. COMPARISON:  CT head from today.  MRI head February 16, 2019. FINDINGS: Brain: No acute infarction, hemorrhage, hydrocephalus, or extra-axial fluid collection. Similar extra-axial dural-based mass along the left lateral posterior fossa, approximately 18 mm and likely a meningioma. No significant mass effect or adjacent brain edema. Cerebral atrophy. Vascular: Major arterial flow voids are maintained at the skull base. Skull and upper cervical spine: Normal marrow signal. Sinuses/Orbits: Clear sinuses.  No acute orbital findings. Other: No mastoid effusions. IMPRESSION: 1. No acute abnormality. 2. Similar putative meningioma along the left lateral posterior fossa. No significant mass effect or adjacent brain edema. Electronically Signed   By: Feliberto Harts M.D.   On: 09/26/2023 19:47   DG Chest 2 View Result Date: 09/26/2023 CLINICAL DATA:  Cough. EXAM: CHEST - 2 VIEW COMPARISON:  Jan 08, 2023. FINDINGS: The heart size and mediastinal contours are within normal limits. Stable reticular densities are noted bilaterally most consistent with chronic interstitial lung disease or fibrosis. No definite acute abnormality is seen. The visualized skeletal  structures are unremarkable. IMPRESSION: Stable chronic bilateral lung opacities are noted. No acute abnormality seen. Electronically Signed   By: Lupita Raider M.D.   On: 09/26/2023 18:54   CT Head Wo Contrast Result Date: 09/26/2023 CLINICAL DATA:  Mental status change, unknown cause EXAM: CT HEAD WITHOUT CONTRAST TECHNIQUE: Contiguous axial images were obtained from the base of the skull through the vertex without intravenous  contrast. RADIATION DOSE REDUCTION: This exam was performed according to the departmental dose-optimization program which includes automated exposure control, adjustment of the mA and/or kV according to patient size and/or use of iterative reconstruction technique. COMPARISON:  04/02/2023 FINDINGS: Brain: No evidence of acute infarction, hemorrhage, hydrocephalus, or extra-axial collection. Stable partially calcified left lateral posterior fossa meningioma measuring approximately 1.7 cm. No appreciable mass effect or edema the adjacent cerebellum. Scattered low-density changes within the periventricular and subcortical white matter most compatible with chronic microvascular ischemic change. Mild diffuse cerebral volume loss. Vascular: No hyperdense vessel or unexpected calcification. Skull: Normal. Negative for fracture or focal lesion. Sinuses/Orbits: No acute finding. Other: None. IMPRESSION: 1. No acute intracranial findings. 2. Stable partially calcified left lateral posterior fossa meningioma measuring approximately 1.7 cm. 3. Chronic microvascular ischemic change and cerebral volume loss. Electronically Signed   By: Duanne Guess D.O.   On: 09/26/2023 14:37       LOS: 1 day   Osvaldo Shipper  [facility] Pager on [email or web address]  09/28/2023, 10:02 AM

## 2023-09-28 NOTE — Discharge Summary (Signed)
[facility]  Physician Discharge Summary   Patient ID: Jason Farmer MRN: [id number] DOB/AGE: [birth year] 89 y.o.  Admit date: 09/26/2023 Discharge date: 09/28/2023    PCP: Crist Fat, MD  DISCHARGE DIAGNOSES:    Hallucination in the setting of Dementia Possible Lewy Body Dementia   Atrial flutter with rapid ventricular response (HCC)   RECOMMENDATIONS FOR OUTPATIENT FOLLOW UP: Patient has an appointment with the neurologist this month. Cardiology to schedule outpatient follow-up Outpatient evaluation of microscopic hematuria   Home Health: None Equipment/Devices: None  CODE STATUS: Full code  DISCHARGE CONDITION: fair  Diet recommendation: As before  INITIAL HISTORY: 89 y.o. male with hx of dementia, A-fib on anticoagulation, coronary disease by imaging, hypertension, hyperlipidemia, esophageal stricture, gastritis, who was transferred ED to ED from [facility] due to acute onset of hallucinations, and recommendation for MRI evaluation.  Per report that patient seeing form hallucinations like writing on the wall, people in the room, ships going out to see, a tiger walking around. Symptoms began abruptly on Wed 1/29.  He was also noted to be in atrial flutter with RVR and was cardioverted in the emergency department.   Consultants: Cardiology.  Phone discussion with neurology   Procedures: Bedside DC cardioversion by EDP  HOSPITAL COURSE:   Hallucinations in the setting of history of dementia and behavioral changes Patient's case was discussed with neurology who recommended MRI.  MRI did not show any acute findings.  Stable meningioma was noted.  Again discussed with neurology who opined that this could be possible Lewy body dementia and recommended trial of Seroquel at night and to decrease the dose of mirtazapine.  Patient can follow-up with neurology in the outpatient setting. Patient was started on Seroquel and dose of mirtazapine was decreased. Patient feels that  his hallucinations are improving.  Will leave him on current dose of Seroquel for now. His daughter mentioned that he has an appointment coming up with his neurologist this month.  He is followed by [facility] neurology. He is also on memantine which is being continued.   Atrial flutter with RVR He is status post DC cardioversion in the ED. after cardioversion he was still noted to have paroxysms of atrial flutter.  Started on amiodarone infusion by cardiology yesterday.  Noted to be in sinus bradycardia this morning.  Asymptomatic. He is on apixaban and metoprolol as well. Looks like amiodarone is supposed to infuse for 24 hours and then he is supposed to transition to 400 mg twice a day for 1 week followed by 40 mg once a day for 1 week and then 200 mg daily after that.  Cardiology is considering ZIO monitor on discharge. Echocardiogram shows EF of 60 to 65%. No pericardial effusion.  No significant wall motion abnormalities.  No diastolic dysfunction noted. Seen by cardiology.  Okay for discharge on amiodarone.   Elevated creatinine Likely chronic kidney disease stage IIIb Renal function is stable.    Coronary artery disease by imaging Continue statin.  Seems to be asymptomatic.   Essential hypertension  Microscopic hematuria Outpatient management.  Patient is stable.  Okay for discharge home today.  Discussed with his daughter.   PERTINENT LABS:  The results of significant diagnostics from this hospitalization (including imaging, microbiology, ancillary and laboratory) are listed below for reference.    Microbiology: Recent Results (from the past 240 hours)  Resp panel by RT-PCR (RSV, Flu A&B, Covid) Anterior Nasal Swab     Status: None   Collection Time: 09/26/23  1:42 PM   Specimen: Anterior Nasal Swab  Result Value Ref Range Status   SARS Coronavirus 2 by RT PCR NEGATIVE NEGATIVE Final    Comment: (NOTE) SARS-CoV-2 target nucleic acids are NOT DETECTED.  The SARS-CoV-2 RNA  is generally detectable in upper respiratory specimens during the acute phase of infection. The lowest concentration of SARS-CoV-2 viral copies this assay can detect is 138 copies/mL. A negative result does not preclude SARS-Cov-2 infection and should not be used as the sole basis for treatment or other patient management decisions. A negative result may occur with  improper specimen collection/handling, submission of specimen other than nasopharyngeal swab, presence of viral mutation(s) within the areas targeted by this assay, and inadequate number of viral copies(<138 copies/mL). A negative result must be combined with clinical observations, patient history, and epidemiological information. The expected result is Negative.  Fact Sheet for Patients:  BloggerCourse.com  Fact Sheet for Healthcare Providers:  SeriousBroker.it  This test is no t yet approved or cleared by the [place] FDA and  has been authorized for detection and/or diagnosis of SARS-CoV-2 by FDA under an Emergency Use Authorization (EUA). This EUA will remain  in effect (meaning this test can be used) for the duration of the COVID-19 declaration under Section 564(b)(1) of the Act, 21 U.S.C.section 360bbb-3(b)(1), unless the authorization is terminated  or revoked sooner.       Influenza A by PCR NEGATIVE NEGATIVE Final   Influenza B by PCR NEGATIVE NEGATIVE Final    Comment: (NOTE) The Xpert Xpress SARS-CoV-2/FLU/RSV plus assay is intended as an aid in the diagnosis of influenza from Nasopharyngeal swab specimens and should not be used as a sole basis for treatment. Nasal washings and aspirates are unacceptable for Xpert Xpress SARS-CoV-2/FLU/RSV testing.  Fact Sheet for Patients: BloggerCourse.com  Fact Sheet for Healthcare Providers: SeriousBroker.it  This test is not yet approved or cleared by the  [place] FDA and has been authorized for detection and/or diagnosis of SARS-CoV-2 by FDA under an Emergency Use Authorization (EUA). This EUA will remain in effect (meaning this test can be used) for the duration of the COVID-19 declaration under Section 564(b)(1) of the Act, 21 U.S.C. section 360bbb-3(b)(1), unless the authorization is terminated or revoked.     Resp Syncytial Virus by PCR NEGATIVE NEGATIVE Final    Comment: (NOTE) Fact Sheet for Patients: BloggerCourse.com  Fact Sheet for Healthcare Providers: SeriousBroker.it  This test is not yet approved or cleared by the [place] FDA and has been authorized for detection and/or diagnosis of SARS-CoV-2 by FDA under an Emergency Use Authorization (EUA). This EUA will remain in effect (meaning this test can be used) for the duration of the COVID-19 declaration under Section 564(b)(1) of the Act, 21 U.S.C. section 360bbb-3(b)(1), unless the authorization is terminated or revoked.  Performed at [facility], [address], [place], [place] [address]      Labs:   Basic Metabolic Panel: Recent Labs  Lab 09/26/23 1250 09/27/23 0450 09/28/23 0400  NA 136 136 136  K 4.4 4.4 3.8  CL 104 106 106  CO2 25 21* 20*  GLUCOSE 130* 117* 104*  BUN 16 13 13   CREATININE 1.32* 1.46* 1.36*  CALCIUM 9.3 8.7* 8.6*  MG  --  2.0  --   PHOS  --  3.3  --    Liver Function Tests: Recent Labs  Lab 09/26/23 1250  AST 18  ALT 11  ALKPHOS 69  BILITOT 0.6  PROT 7.1  ALBUMIN 3.8   CBC: Recent Labs  Lab 09/26/23 1250 09/28/23 0400  WBC 5.7 6.1  HGB 14.6 13.4  HCT 43.5 40.0  MCV 89.9 90.5  PLT 257 239   BNP: BNP (last 3 results) Recent Labs    11/12/22 1703 09/27/23 0450  BNP 81.7 241.4*    CBG: Recent Labs  Lab 09/26/23 1340  GLUCAP 125*     IMAGING STUDIES ECHOCARDIOGRAM COMPLETE BUBBLE STUDY Result Date: 09/27/2023     ECHOCARDIOGRAM REPORT   Patient Name:   SHEAMUS HASTING Belger Date of Exam: 09/27/2023 Medical Rec #:  [id number]        Height:       68.0 in Accession #:    [id number]       Weight:       149.9 lb Date of Birth:  September 17, 1934         BSA:          1.808 m Patient Age:    88 years         BP:           119/84 mmHg Patient Gender: M                HR:           68 bpm. Exam Location:  Inpatient Procedure: 2D Echo, Cardiac Doppler and Color Doppler Indications:    Atrial fibrillation [facility])  History:        Patient has prior history of Echocardiogram examinations, most                 recent 11/13/2022. TIA.  Sonographer:    Darlys Gales Referring Phys: [id number] DAMARCUS A INGRAM IMPRESSIONS  1. Left ventricular ejection fraction, by estimation, is 60 to 65%. The left ventricle has normal function. The left ventricle has no regional wall motion abnormalities. Left ventricular diastolic parameters are indeterminate. Elevated left ventricular end-diastolic pressure.  2. Right ventricular systolic function is normal. The right ventricular size is mildly enlarged.  3. Left atrial size was moderately dilated.  4. Right atrial size was moderately dilated.  5. The mitral valve is grossly normal. No evidence of mitral valve regurgitation. No evidence of mitral stenosis.  6. The aortic valve is tricuspid. Aortic valve regurgitation is mild. No aortic stenosis is present. Aortic regurgitation PHT measures 533 msec.  7. Agitated saline contrast bubble study was negative, with no evidence of any interatrial shunt. FINDINGS  Left Ventricle: Left ventricular ejection fraction, by estimation, is 60 to 65%. The left ventricle has normal function. The left ventricle has no regional wall motion abnormalities. The left ventricular internal cavity size was normal in size. There is  no left ventricular hypertrophy. Left ventricular diastolic parameters are indeterminate. Elevated left ventricular end-diastolic pressure. Right Ventricle: The right  ventricular size is mildly enlarged. No increase in right ventricular wall thickness. Right ventricular systolic function is normal. Left Atrium: Left atrial size was moderately dilated. Right Atrium: Right atrial size was moderately dilated. Pericardium: There is no evidence of pericardial effusion. Mitral Valve: The mitral valve is grossly normal. Mild to moderate mitral annular calcification. No evidence of mitral valve regurgitation. No evidence of mitral valve stenosis. Tricuspid Valve: The tricuspid valve is normal in structure. Tricuspid valve regurgitation is mild . No evidence of tricuspid stenosis. Aortic Valve: The aortic valve is tricuspid. Aortic valve regurgitation is mild. Aortic regurgitation PHT measures 533 msec. No aortic stenosis is present. Aortic valve mean gradient measures 4.0 mmHg.  Aortic valve peak gradient measures 7.2 mmHg. Aortic  valve area, by VTI measures 2.76 cm. Pulmonic Valve: The pulmonic valve was normal in structure. Pulmonic valve regurgitation is mild to moderate. No evidence of pulmonic stenosis. Aorta: The aortic root and ascending aorta are structurally normal, with no evidence of dilitation. Venous: The inferior vena cava was not well visualized. IAS/Shunts: The interatrial septum was not assessed. Agitated saline contrast was given intravenously to evaluate for intracardiac shunting. Agitated saline contrast bubble study was negative, with no evidence of any interatrial shunt.  LEFT VENTRICLE PLAX 2D LVIDd:         4.20 cm   Diastology LVIDs:         2.40 cm   LV e' medial:    6.74 cm/s LV PW:         1.10 cm   LV E/e' medial:  17.5 LV IVS:        1.10 cm   LV e' lateral:   7.51 cm/s LVOT diam:     2.00 cm   LV E/e' lateral: 15.7 LV SV:         79 LV SV Index:   43 LVOT Area:     3.14 cm  RIGHT VENTRICLE RV S prime:     9.79 cm/s TAPSE (M-mode): 2.2 cm LEFT ATRIUM              Index        RIGHT ATRIUM           Index LA Vol (A2C):   110.0 ml 60.84 ml/m  RA Area:      20.50 cm LA Vol (A4C):   59.8 ml  33.07 ml/m  RA Volume:   63.70 ml  35.23 ml/m LA Biplane Vol: 81.7 ml  45.19 ml/m  AORTIC VALVE AV Area (Vmax):    2.34 cm AV Area (Vmean):   2.47 cm AV Area (VTI):     2.76 cm AV Vmax:           134.00 cm/s AV Vmean:          96.600 cm/s AV VTI:            0.285 m AV Peak Grad:      7.2 mmHg AV Mean Grad:      4.0 mmHg LVOT Vmax:         99.80 cm/s LVOT Vmean:        76.000 cm/s LVOT VTI:          0.250 m LVOT/AV VTI ratio: 0.88 AI PHT:            533 msec  AORTA Ao Root diam: 3.50 cm Ao Asc diam:  3.20 cm MITRAL VALVE                TRICUSPID VALVE MV Area (PHT): 3.60 cm     TR Peak grad:   38.7 mmHg MV Decel Time: 211 msec     TR Vmax:        311.00 cm/s MV E velocity: 118.00 cm/s MV A velocity: 70.70 cm/s   SHUNTS MV E/A ratio:  1.67         Systemic VTI:  0.25 m                             Systemic Diam: 2.00 cm Vishnu Priya Mallipeddi Electronically signed by Winfield Rast Mallipeddi Signature Date/Time: 09/27/2023/4:41:47 PM  Final    MR BRAIN WO CONTRAST Result Date: 09/26/2023 CLINICAL DATA:  Mental status change, unknown cause. EXAM: MRI HEAD WITHOUT CONTRAST TECHNIQUE: Multiplanar, multiecho pulse sequences of the brain and surrounding structures were obtained without intravenous contrast. COMPARISON:  CT head from today.  MRI head February 16, 2019. FINDINGS: Brain: No acute infarction, hemorrhage, hydrocephalus, or extra-axial fluid collection. Similar extra-axial dural-based mass along the left lateral posterior fossa, approximately 18 mm and likely a meningioma. No significant mass effect or adjacent brain edema. Cerebral atrophy. Vascular: Major arterial flow voids are maintained at the skull base. Skull and upper cervical spine: Normal marrow signal. Sinuses/Orbits: Clear sinuses.  No acute orbital findings. Other: No mastoid effusions. IMPRESSION: 1. No acute abnormality. 2. Similar putative meningioma along the left lateral posterior fossa. No significant  mass effect or adjacent brain edema. Electronically Signed   By: Feliberto Harts M.D.   On: 09/26/2023 19:47   DG Chest 2 View Result Date: 09/26/2023 CLINICAL DATA:  Cough. EXAM: CHEST - 2 VIEW COMPARISON:  Jan 08, 2023. FINDINGS: The heart size and mediastinal contours are within normal limits. Stable reticular densities are noted bilaterally most consistent with chronic interstitial lung disease or fibrosis. No definite acute abnormality is seen. The visualized skeletal structures are unremarkable. IMPRESSION: Stable chronic bilateral lung opacities are noted. No acute abnormality seen. Electronically Signed   By: Lupita Raider M.D.   On: 09/26/2023 18:54   CT Head Wo Contrast Result Date: 09/26/2023 CLINICAL DATA:  Mental status change, unknown cause EXAM: CT HEAD WITHOUT CONTRAST TECHNIQUE: Contiguous axial images were obtained from the base of the skull through the vertex without intravenous contrast. RADIATION DOSE REDUCTION: This exam was performed according to the departmental dose-optimization program which includes automated exposure control, adjustment of the mA and/or kV according to patient size and/or use of iterative reconstruction technique. COMPARISON:  04/02/2023 FINDINGS: Brain: No evidence of acute infarction, hemorrhage, hydrocephalus, or extra-axial collection. Stable partially calcified left lateral posterior fossa meningioma measuring approximately 1.7 cm. No appreciable mass effect or edema the adjacent cerebellum. Scattered low-density changes within the periventricular and subcortical white matter most compatible with chronic microvascular ischemic change. Mild diffuse cerebral volume loss. Vascular: No hyperdense vessel or unexpected calcification. Skull: Normal. Negative for fracture or focal lesion. Sinuses/Orbits: No acute finding. Other: None. IMPRESSION: 1. No acute intracranial findings. 2. Stable partially calcified left lateral posterior fossa meningioma measuring  approximately 1.7 cm. 3. Chronic microvascular ischemic change and cerebral volume loss. Electronically Signed   By: Duanne Guess D.O.   On: 09/26/2023 14:37    DISCHARGE EXAMINATION: See progress note from earlier today  DISPOSITION: Home/independent living facility  Discharge Instructions     Call MD for:  difficulty breathing, headache or visual disturbances   Complete by: As directed    Call MD for:  extreme fatigue   Complete by: As directed    Call MD for:  persistant dizziness or light-headedness   Complete by: As directed    Call MD for:  persistant nausea and vomiting   Complete by: As directed    Call MD for:  severe uncontrolled pain   Complete by: As directed    Call MD for:  temperature >100.4   Complete by: As directed    Diet - low sodium heart healthy   Complete by: As directed    Discharge instructions   Complete by: As directed    Please be sure to follow-up with your neurologist.  Cardiology will arrange follow-up as well.  You were cared for by a hospitalist during your hospital stay. If you have any questions about your discharge medications or the care you received while you were in the hospital after you are discharged, you can call the unit and asked to speak with the hospitalist on call if the hospitalist that took care of you is not available. Once you are discharged, your primary care physician will handle any further medical issues. Please note that NO REFILLS for any discharge medications will be authorized once you are discharged, as it is imperative that you return to your primary care physician (or establish a relationship with a primary care physician if you do not have one) for your aftercare needs so that they can reassess your need for medications and monitor your lab values. If you do not have a primary care physician, you can call [phone] for a physician referral.   Increase activity slowly   Complete by: As directed          Allergies  as of 09/28/2023       Reactions   Statins Other (See Comments)   Muscle cramps   Aspirin Other (See Comments)   bleeding        Medication List     TAKE these medications    acetaminophen 325 MG tablet Commonly known as: TYLENOL Take 325 mg by mouth every 6 (six) hours as needed for moderate pain or headache.   amiodarone 200 MG tablet Commonly known as: PACERONE Take 2 tablets (400 mg total) by mouth 2 (two) times daily for 7 days, THEN 2 tablets (400 mg total) daily for 7 days, THEN 1 tablet (200 mg total) daily. Start taking on: September 28, 2023   Eliquis 5 MG Tabs tablet Generic drug: apixaban TAKE ONE TABLET BY MOUTH TWICE DAILY   escitalopram 10 MG tablet Commonly known as: LEXAPRO Take 1 tablet (10 mg total) by mouth daily.   ezetimibe 10 MG tablet Commonly known as: ZETIA TAKE ONE TABLET BY MOUTH EVERY EVENING   fluticasone 50 MCG/ACT nasal spray Commonly known as: FLONASE Place 1 spray into both nostrils in the morning and at bedtime. What changed:  when to take this reasons to take this   latanoprost 0.005 % ophthalmic solution Commonly known as: XALATAN Place 1 drop into both eyes at bedtime.   meclizine 12.5 MG tablet Commonly known as: ANTIVERT Take 1 tablet (12.5 mg total) by mouth 3 (three) times daily as needed for dizziness.   memantine 5 MG tablet Commonly known as: NAMENDA Take 1 tablet (5 mg at night) for 2 weeks, then increase to 1 tablet (5 mg) twice a day What changed:  how much to take how to take this when to take this additional instructions   metoprolol tartrate 50 MG tablet Commonly known as: LOPRESSOR TAKE ONE TABLET BY MOUTH TWICE DAILY   mirtazapine 15 MG tablet Commonly known as: REMERON Take 0.5 tablets (7.5 mg total) by mouth every evening. What changed: how much to take   omeprazole 20 MG capsule Commonly known as: PRILOSEC TAKE ONE CAPSULE BY MOUTH EVERY MORNING   QUEtiapine 25 MG tablet Commonly known as:  SEROQUEL Take 1 tablet (25 mg total) by mouth at bedtime.           TOTAL DISCHARGE TIME: 35 minutes  Mister Krahenbuhl [employer] on [email or web address]  09/28/2023, 4:32 PM

## 2023-10-02 DIAGNOSIS — I4892 Unspecified atrial flutter: Secondary | ICD-10-CM | POA: Diagnosis not present

## 2023-10-06 ENCOUNTER — Encounter: Payer: Self-pay | Admitting: Internal Medicine

## 2023-10-06 ENCOUNTER — Ambulatory Visit: Payer: Medicare Other | Admitting: Internal Medicine

## 2023-10-06 VITALS — BP 122/70 | HR 51 | Temp 98.2°F | Resp 18 | Ht 68.0 in | Wt 157.5 lb

## 2023-10-06 DIAGNOSIS — R42 Dizziness and giddiness: Secondary | ICD-10-CM | POA: Diagnosis not present

## 2023-10-06 DIAGNOSIS — G3183 Dementia with Lewy bodies: Secondary | ICD-10-CM

## 2023-10-06 DIAGNOSIS — J0110 Acute frontal sinusitis, unspecified: Secondary | ICD-10-CM | POA: Insufficient documentation

## 2023-10-06 DIAGNOSIS — K5901 Slow transit constipation: Secondary | ICD-10-CM | POA: Diagnosis not present

## 2023-10-06 DIAGNOSIS — I4891 Unspecified atrial fibrillation: Secondary | ICD-10-CM

## 2023-10-06 DIAGNOSIS — F02B2 Dementia in other diseases classified elsewhere, moderate, with psychotic disturbance: Secondary | ICD-10-CM | POA: Diagnosis not present

## 2023-10-06 MED ORDER — POLYETHYLENE GLYCOL 3350 17 G PO PACK: 17.0000 g | PACK | Freq: Every day | ORAL | 0 refills | Status: AC

## 2023-10-06 MED ORDER — FEXOFENADINE HCL 180 MG PO TABS
180.0000 mg | ORAL_TABLET | Freq: Every day | ORAL | 2 refills | Status: DC
Start: 2023-10-06 — End: 2023-12-17

## 2023-10-06 MED ORDER — SALINE NASAL SPRAY 0.65 % NA SOLN: 1.0000 | NASAL | 12 refills | Status: DC | PRN

## 2023-10-06 NOTE — Progress Notes (Unsigned)
Office Visit  Subjective   Patient ID: Jason Farmer   DOB: 1934-12-11   Age: 89 y.o.   MRN: [id number]   Chief Complaint Chief Complaint  Patient presents with   office visit    needs assement to see if [facility] is appropriate for a respite stay.  He recently started on Quetiapine, and Amiodarone, and p/t Lewy Body Dementia dx on 2/1.      History of Present Illness 89 years old male with history of Lewy body dementia who is here with his daughter for evaluation.  Daughter is worried that patient cannot live by himself alone and she has to come to stay with her.  Patient says that he is comfortable living alone.  Daughter is also worried that he Dr. And it is very stressful for the family.  Patient was admitted to hospital on February 1st because of hallucinations.  He was started on Seroquel and was discharged home.  He says that he does not have any more hallucinations.  He is forgetful and on his recall he has 1/3 words recall in 1 minutes.  He is taking Namenda 5 mg twice a day.    He also has running nose and frontal headache and says that was the main reason he went to hospital.  He feel pressure on his forehead.  He also feels dizziness and lightheadedness.  His dizziness started 6 weeks ago.  He says that when he was in hospital he was seen by 2 cardiologists bout atrial fibrillation.  They were discussing whether dizziness comes from metoprolol or not but they decided to continue with 50 mg twice a day because of Afib with with RVR.  He is also on amiodarone 200 mg 2 tablets  twice a day for 7 days then 2 tablets daily for 1 week and then 200 mg daily and his daughter will follow. He says that dizziness is almost every day day and night with frontal headache for almost 3 weeks now.    He also says that his biggest problem is that he cannot sleep at night.  He also has no appetite and anxiety bother him a lot.  He is taking mirtazapine 15 mg half tablet daily.    He also is complaining of  constipation that also bother him.    Past Medical History Past Medical History:  Diagnosis Date   Allergy    Anxiety    "not a problem for a long time" (11/27/2016)   Arthritis    "wrists, knees, shoulders, back" (11/27/2016)   Chronic lower back pain    "5th disc is protruding" (11/27/2016)   Essential tremor    GERD (gastroesophageal reflux disease)    Glaucoma    Heart murmur    dx'd in [employer] in the 1960s; haven't been seen for it since "(11/27/2016)   Hepatitis ~ 1958   "jaundice kind"   HOH (hard of hearing)    Hyperlipemia    Migraine    "none since my neck OR" (11/27/2016)   Seasonal allergies    TIA (transient ischemic attack) 04/2015   Wears glasses    Wears hearing aid      Allergies Allergies  Allergen Reactions   Statins Other (See Comments)    Muscle cramps    Aspirin Other (See Comments)    bleeding     Review of Systems Review of Systems  Constitutional: Negative.   HENT: Negative.    Respiratory: Negative.    Cardiovascular: Negative.  Gastrointestinal: Negative.   Neurological: Negative.        Objective:    Vitals BP 122/70 (BP Location: Left Arm, Patient Position: Sitting, Cuff Size: Normal)   Pulse (!) 51   Temp 98.2 F (36.8 C)   Resp 18   Ht 5\' 8"  (1.727 m)   Wt 157 lb 8 oz (71.4 kg)   SpO2 99%   BMI 23.95 kg/m    Physical Examination Physical Exam Constitutional:      Appearance: Normal appearance.  HENT:     Head: Normocephalic and atraumatic.  Cardiovascular:     Rate and Rhythm: Normal rate and regular rhythm.     Heart sounds: Normal heart sounds.  Pulmonary:     Effort: Pulmonary effort is normal.     Breath sounds: Normal breath sounds.  Abdominal:     General: Bowel sounds are normal.     Palpations: Abdomen is soft.  Neurological:     General: No focal deficit present.     Mental Status: He is alert and oriented to person, place, and time.        Assessment & Plan:   Acute non-recurrent frontal sinusitis    He will continue with follow knees at nighttime and take saline mist 2 spray to each nose 3 to 4 times a day.  He will also take Allegra 180 mg daily for 2 weeks.  Will continue to monitor.  Slow transit constipation   I will start him on MiraLax 17 g daily.  He will also try to drink more water.  Atrial fibrillation with RVR (HCC)   As he is in sinus rhythm now and heart rate is controlled.  He will decrease the dose of metoprolol to 25 mg twice a day to see if that helps with his dizziness.  He will continue to monitor his heart rate.  Lewy body dementia (HCC)   He will continue with Seroquel at nighttime 25 mg daily.  I will increase the dose of Namenda to 10 mg twice a day and monitor. I have also discussed with the daughter that patient should not drive.  He agrees that he will not drive for 2 weeks until we see him again.    Return in about 2 weeks (around 10/20/2023).   Eloisa Northern, MD

## 2023-10-07 DIAGNOSIS — F0280 Dementia in other diseases classified elsewhere without behavioral disturbance: Secondary | ICD-10-CM | POA: Insufficient documentation

## 2023-10-07 MED ORDER — MEMANTINE HCL 10 MG PO TABS: 10.0000 mg | ORAL_TABLET | Freq: Two times a day (BID) | ORAL | 6 refills | Status: AC

## 2023-10-07 NOTE — Assessment & Plan Note (Signed)
As he is in sinus rhythm now and heart rate is controlled.  He will decrease the dose of metoprolol to 25 mg twice a day to see if that helps with his dizziness.  He will continue to monitor his heart rate.

## 2023-10-07 NOTE — Assessment & Plan Note (Signed)
I will start him on MiraLax 17 g daily.  He will also try to drink more water.

## 2023-10-07 NOTE — Assessment & Plan Note (Addendum)
He will continue with Seroquel at nighttime 25 mg daily.  I will increase the dose of Namenda to 10 mg twice a day and monitor. I have also discussed with the daughter that patient should not drive.  He agrees that he will not drive for 2 weeks until we see him again.

## 2023-10-07 NOTE — Assessment & Plan Note (Signed)
He will continue with follow knees at nighttime and take saline mist 2 spray to each nose 3 to 4 times a day.  He will also take Allegra 180 mg daily for 2 weeks.  Will continue to monitor.

## 2023-10-08 ENCOUNTER — Encounter: Payer: Self-pay | Admitting: Psychology

## 2023-10-12 ENCOUNTER — Encounter (HOSPITAL_COMMUNITY): Payer: Self-pay | Admitting: Physician Assistant

## 2023-10-12 ENCOUNTER — Ambulatory Visit (HOSPITAL_COMMUNITY)
Admission: RE | Admit: 2023-10-12 | Discharge: 2023-10-12 | Disposition: A | Discharge status: Home / Self Care | Payer: Medicare Other | Source: Ambulatory Visit | Attending: Physician Assistant | Admitting: Physician Assistant

## 2023-10-12 VITALS — BP 138/82 | HR 61 | Ht 68.0 in | Wt 155.2 lb

## 2023-10-12 DIAGNOSIS — Z5181 Encounter for therapeutic drug level monitoring: Secondary | ICD-10-CM | POA: Insufficient documentation

## 2023-10-12 DIAGNOSIS — I2510 Atherosclerotic heart disease of native coronary artery without angina pectoris: Secondary | ICD-10-CM | POA: Insufficient documentation

## 2023-10-12 DIAGNOSIS — R441 Visual hallucinations: Secondary | ICD-10-CM | POA: Diagnosis not present

## 2023-10-12 DIAGNOSIS — Z8673 Personal history of transient ischemic attack (TIA), and cerebral infarction without residual deficits: Secondary | ICD-10-CM | POA: Insufficient documentation

## 2023-10-12 DIAGNOSIS — Z79899 Other long term (current) drug therapy: Secondary | ICD-10-CM | POA: Insufficient documentation

## 2023-10-12 DIAGNOSIS — R110 Nausea: Secondary | ICD-10-CM | POA: Insufficient documentation

## 2023-10-12 DIAGNOSIS — I700 Atherosclerosis of aorta: Secondary | ICD-10-CM | POA: Insufficient documentation

## 2023-10-12 DIAGNOSIS — I10 Essential (primary) hypertension: Secondary | ICD-10-CM | POA: Diagnosis not present

## 2023-10-12 DIAGNOSIS — R42 Dizziness and giddiness: Secondary | ICD-10-CM | POA: Diagnosis not present

## 2023-10-12 DIAGNOSIS — I4892 Unspecified atrial flutter: Secondary | ICD-10-CM | POA: Insufficient documentation

## 2023-10-12 DIAGNOSIS — E785 Hyperlipidemia, unspecified: Secondary | ICD-10-CM | POA: Diagnosis not present

## 2023-10-12 DIAGNOSIS — I480 Paroxysmal atrial fibrillation: Secondary | ICD-10-CM | POA: Insufficient documentation

## 2023-10-12 DIAGNOSIS — Z7901 Long term (current) use of anticoagulants: Secondary | ICD-10-CM | POA: Insufficient documentation

## 2023-10-12 DIAGNOSIS — D6869 Other thrombophilia: Secondary | ICD-10-CM | POA: Insufficient documentation

## 2023-10-12 DIAGNOSIS — I4510 Unspecified right bundle-branch block: Secondary | ICD-10-CM | POA: Insufficient documentation

## 2023-10-12 MED ORDER — AMIODARONE HCL 200 MG PO TABS: 200.0000 mg | ORAL_TABLET | Freq: Every day | ORAL | 3 refills | Status: DC

## 2023-10-12 MED ORDER — DABIGATRAN ETEXILATE MESYLATE 150 MG PO CAPS: 150.0000 mg | ORAL_CAPSULE | Freq: Two times a day (BID) | ORAL | 6 refills | Status: AC

## 2023-10-12 NOTE — Addendum Note (Signed)
Encounter addended by: Shona Simpson, RN on: 10/12/2023 2:02 PM  Actions taken: Medication long-term status modified, Order list changed

## 2023-10-12 NOTE — Progress Notes (Addendum)
Primary Care Physician: Crist Fat, MD Primary Cardiologist: Charlton Haws, MD Electrophysiologist: Nobie Putnam, MD  Referring Physician: [name] NP   Jason Farmer is a 89 y.o. male with a history of HLD, TIA, HTN, CAD, atrial fibrillation who presents for consultation in the [facility].  The patient was initially diagnosed with atrial fibrillation during an admission 10/2022. He was to have EGD with dilatation for dysphagia and on presentation noted to have HR 140-150. ECG showed new onset rapid afib. He was started on metoprolol for rate control and Eliquis for a CHADS2VASC score of 6. He was asymptomatic.   Patient presented to the ED 09/26/23 with hallucinations but was also found to be in afib with RVR. He underwent DCCV in the ED this admission but had recurrent flutter with RVR prompting cardiology consult. He was loaded on amiodarone. His Eliquis was changed to Pradaxa for cost reasons.   Patient returns for follow up for atrial fibrillation and amiodarone monitoring. He is in SR today. He is currently wearing a Zio monitor to assess his arrhythmia burden. Despite being in SR, he continues to have lightheadedness and "seeing letters on the walls". He has follow up with neurology and neuro psych to evaluate. He has noted some nausea and decreased appetite since starting amiodarone.   Today, he denies symptoms of palpitations, chest pain, shortness of breath, orthopnea, PND, lower extremity edema, presyncope, syncope, snoring, daytime somnolence, bleeding. The patient is tolerating medications without difficulties and is otherwise without complaint today.    Atrial Fibrillation Risk Factors:  he does not have symptoms or diagnosis of sleep apnea. he does not have a history of rheumatic fever.   Atrial Fibrillation Management history:  Previous antiarrhythmic drugs: amiodarone  Previous cardioversions: 09/26/23 Previous ablations:  none Anticoagulation history: Eliquis, Pradaxa  ROS- All systems are reviewed and negative except as per the HPI above.  Past Medical History:  Diagnosis Date   Allergy    Anxiety    "not a problem for a long time" (11/27/2016)   Arthritis    "wrists, knees, shoulders, back" (11/27/2016)   Chronic lower back pain    "5th disc is protruding" (11/27/2016)   Essential tremor    GERD (gastroesophageal reflux disease)    Glaucoma    Heart murmur    dx'd in [employer] in the 1960s; haven't been seen for it since "(11/27/2016)   Hepatitis ~ 1958   "jaundice kind"   HOH (hard of hearing)    Hyperlipemia    Migraine    "none since my neck OR" (11/27/2016)   Seasonal allergies    TIA (transient ischemic attack) 04/2015   Wears glasses    Wears hearing aid     Current Outpatient Medications  Medication Sig Dispense Refill   acetaminophen (TYLENOL) 325 MG tablet Take 325 mg by mouth every 6 (six) hours as needed for moderate pain or headache.     amiodarone (PACERONE) 200 MG tablet Take 2 tablets (400 mg total) by mouth 2 (two) times daily for 7 days, THEN 2 tablets (400 mg total) daily for 7 days, THEN 1 tablet (200 mg total) daily. 102 tablet 0   ELIQUIS 5 MG TABS tablet TAKE ONE TABLET BY MOUTH TWICE DAILY 60 tablet 0   escitalopram (LEXAPRO) 10 MG tablet Take 1 tablet (10 mg total) by mouth daily. 90 tablet 2   ezetimibe (ZETIA) 10 MG tablet TAKE ONE TABLET BY MOUTH EVERY EVENING 30 tablet  0   fexofenadine (ALLEGRA) 180 MG tablet Take 1 tablet (180 mg total) by mouth daily. 30 tablet 2   fluticasone (FLONASE) 50 MCG/ACT nasal spray Place 1 spray into both nostrils in the morning and at bedtime. (Patient taking differently: Place 1 spray into both nostrils daily as needed for allergies.) 15.8 mL 0   latanoprost (XALATAN) 0.005 % ophthalmic solution Place 1 drop into both eyes at bedtime.      meclizine (ANTIVERT) 12.5 MG tablet Take 1 tablet (12.5 mg total) by mouth 3 (three) times daily as needed  for dizziness. 30 tablet 0   memantine (NAMENDA) 10 MG tablet Take 1 tablet (10 mg total) by mouth 2 (two) times daily. 60 tablet 6   metoprolol tartrate (LOPRESSOR) 50 MG tablet TAKE ONE TABLET BY MOUTH TWICE DAILY 60 tablet 0   mirtazapine (REMERON) 15 MG tablet Take 0.5 tablets (7.5 mg total) by mouth every evening.     omeprazole (PRILOSEC) 20 MG capsule TAKE ONE CAPSULE BY MOUTH EVERY MORNING 30 capsule 0   polyethylene glycol (MIRALAX / GLYCOLAX) 17 g packet Take 17 g by mouth daily. 14 each 0   QUEtiapine (SEROQUEL) 25 MG tablet Take 1 tablet (25 mg total) by mouth at bedtime. 30 tablet 1   sodium chloride (OCEAN) 0.65 % nasal spray Place 1 spray into the nose as needed for congestion. 30 mL 12   No current facility-administered medications for this encounter.    Physical Exam: BP 138/82   Pulse 61   Ht 5\' 8"  (1.727 m)   Wt 70.4 kg   BMI 23.60 kg/m   GEN: Well nourished, well developed in no acute distress CARDIAC: Regular rate and rhythm, no murmurs, rubs, gallops RESPIRATORY:  Clear to auscultation without rales, wheezing or rhonchi  ABDOMEN: Soft, non-tender, non-distended EXTREMITIES:  No edema; No deformity    Wt Readings from Last 3 Encounters:  10/12/23 70.4 kg  10/06/23 71.4 kg  09/26/23 68 kg     EKG today demonstrates  SR, RBBB Vent. rate 61 BPM PR interval 180 ms QRS duration 148 ms QT/QTcB 470/473 ms   Echo 09/27/23 demonstrated   1. Left ventricular ejection fraction, by estimation, is 60 to 65%. The  left ventricle has normal function. The left ventricle has no regional  wall motion abnormalities. Left ventricular diastolic parameters are  indeterminate. Elevated left ventricular end-diastolic pressure.   2. Right ventricular systolic function is normal. The right ventricular  size is mildly enlarged.   3. Left atrial size was moderately dilated.   4. Right atrial size was moderately dilated.   5. The mitral valve is grossly normal. No evidence of  mitral valve  regurgitation. No evidence of mitral stenosis.   6. The aortic valve is tricuspid. Aortic valve regurgitation is mild. No  aortic stenosis is present. Aortic regurgitation PHT measures 533 msec.   7. Agitated saline contrast bubble study was negative, with no evidence  of any interatrial shunt.     CHA2DS2-VASc Score = 6  The patient's score is based upon: CHF History: 0 HTN History: 1 Diabetes History: 0 Stroke History: 2 (TIA) Vascular Disease History: 1 (CAD and aortic atheroslcerisis on CT) Age Score: 2 Gender Score: 0       ASSESSMENT AND PLAN: Paroxysmal Atrial Fibrillation/atrial flutter The patient's CHA2DS2-VASc score is 6, indicating a 9.7% annual risk of stroke.   Patient in SR today.  Will decrease amiodarone to 200 mg daily to see if this helps  his nausea/loss of appetite.  Continue Lopressor 50 mg BID Patient is unsure if he is taking Eliquis or Pradaxa. He will check with his daughter when he gets home.  Results of Zio monitor pending.   Secondary Hypercoagulable State (ICD10:  D68.69) The patient is at significant risk for stroke/thromboembolism based upon his CHA2DS2-VASc Score of 6.  Continue Apixaban (Eliquis) or Pradaxa, patient to call back with clarification.   High Risk Medication Monitoring (ICD 10: Z79.899) Intervals on ECG appropriate for amiodarone monitoring. Decrease to 200 mg daily as above. Will check cmet/TSH at next visit.    HTN Stable on current regimen  CAD Coronary calcifications and aortic atherosclerosis on CT No anginal symptoms  Lightheadedness/visual hallucinations Question of possible Lewy body dementia mentioned in inpatient notes. He has follow up with neurology next week.     Follow up in the AF clinic in 3 months.    Addendum: Patient is currently on Eliquis but would like to transition to Pradaxa for cost reasons. CrCl 39 mL/min. He qualifies for 150 mg BID. Will send in new Rx.      Jorja Loa  PA-C [facility] [address] [place], [place] [address] [phone]

## 2023-10-12 NOTE — Addendum Note (Signed)
Encounter addended by: Danice Goltz, PA on: 10/12/2023 2:00 PM  Actions taken: Clinical Note Signed

## 2023-10-12 NOTE — Patient Instructions (Addendum)
Decrease amiodarone 200mg  once a day  Call clinic regarding if on Eliquis or Pradaxa [phone]

## 2023-10-17 ENCOUNTER — Other Ambulatory Visit: Payer: Self-pay | Admitting: Internal Medicine

## 2023-10-17 DIAGNOSIS — R634 Abnormal weight loss: Secondary | ICD-10-CM

## 2023-10-17 DIAGNOSIS — F32A Depression, unspecified: Secondary | ICD-10-CM

## 2023-10-17 DIAGNOSIS — K219 Gastro-esophageal reflux disease without esophagitis: Secondary | ICD-10-CM

## 2023-10-20 ENCOUNTER — Ambulatory Visit: Payer: Medicare Other | Admitting: Internal Medicine

## 2023-10-20 ENCOUNTER — Encounter: Payer: Self-pay | Admitting: Physician Assistant

## 2023-10-20 ENCOUNTER — Ambulatory Visit (INDEPENDENT_AMBULATORY_CARE_PROVIDER_SITE_OTHER): Payer: Medicare Other | Admitting: Physician Assistant

## 2023-10-20 VITALS — BP 121/67 | HR 64 | Resp 18 | Ht 68.0 in

## 2023-10-20 DIAGNOSIS — F0280 Dementia in other diseases classified elsewhere without behavioral disturbance: Secondary | ICD-10-CM

## 2023-10-20 DIAGNOSIS — F32A Depression, unspecified: Secondary | ICD-10-CM | POA: Diagnosis not present

## 2023-10-20 DIAGNOSIS — G3183 Dementia with Lewy bodies: Secondary | ICD-10-CM

## 2023-10-20 DIAGNOSIS — R42 Dizziness and giddiness: Secondary | ICD-10-CM | POA: Diagnosis not present

## 2023-10-20 NOTE — Patient Instructions (Addendum)
 It was a pleasure to see you today at our office.   Recommendations:  Follow up in  6 months DaT scan for LBD at [facility] memantine  if you want you may resume at 5 mg twice a day        scheduled for repeat neuropsych evaluation for diagnostic clarity on July 2024. Recommend psychiatry for psych medication and grief counseling  Recommend good control of cardiovascular risk factors. continue Seroquel 25 mg nightly  Referral to ENT for BPV     For psychiatric meds, mood meds: Please have your primary care physician manage these medications.  If you have any severe symptoms of a stroke, or other severe issues such as confusion,severe chills or fever, etc call 911 or go to the ER as you may need to be evaluated further   For assessment of decision of mental capacity and competency:  Call Dr. Erick Blinks, geriatric psychiatrist at [phone]  Counseling regarding caregiver distress, including caregiver depression, anxiety and issues regarding community resources, adult day care programs, adult living facilities, or memory care questions:  please contact your  Primary Doctor's Social Worker   Whom to call: Memory  decline, memory medications: Call our office [phone]    [email or web address]   RECOMMENDATIONS FOR ALL PATIENTS WITH MEMORY PROBLEMS: 1. Continue to exercise (Recommend 30 minutes of walking everyday, or 3 hours every week) 2. Increase social interactions - continue going to [place] and enjoy social gatherings with friends and family 3. Eat healthy, avoid fried foods and eat more fruits and vegetables 4. Maintain adequate blood pressure, blood sugar, and blood cholesterol level. Reducing the risk of stroke and cardiovascular disease also helps promoting better memory. 5. Avoid stressful situations. Live a simple life and avoid aggravations. Organize your time and prepare for the next day in anticipation. 6.  Sleep well, avoid any interruptions of sleep and avoid any distractions in the bedroom that may interfere with adequate sleep quality 7. Avoid sugar, avoid sweets as there is a strong link between excessive sugar intake, diabetes, and cognitive impairment We discussed the Mediterranean diet, which has been shown to help patients reduce the risk of progressive memory disorders and reduces cardiovascular risk. This includes eating fish, eat fruits and green leafy vegetables, nuts like almonds and hazelnuts, walnuts, and also use olive oil. Avoid fast foods and fried foods as much as possible. Avoid sweets and sugar as sugar use has been linked to worsening of memory function.  There is always a concern of gradual progression of memory problems. If this is the case, then we may need to adjust level of care according to patient needs. Support, both to the patient and caregiver, should then be put into place.      You have been referred for a neuropsychological evaluation (i.e., evaluation of memory and thinking abilities). Please bring someone with you to this appointment if possible, as it is helpful for the doctor to hear from both you and another adult who knows you well. Please bring eyeglasses and hearing aids if you wear them.    The evaluation will take approximately 3 hours and has two parts:   The first part is a clinical interview with the neuropsychologist (Dr. Milbert Coulter or Dr. Roseanne Reno). During the interview, the neuropsychologist will speak with you and the individual you brought to the appointment.    The second part of the evaluation is testing with the doctor's technician Annabelle Harman or Selena Batten). During the testing, the technician will ask  you to remember different types of material, solve problems, and answer some questionnaires. Your family member will not be present for this portion of the evaluation.   Please note: We must reserve several hours of the neuropsychologist's time and the  psychometrician's time for your evaluation appointment. As such, there is a No-Show fee of $100. If you are unable to attend any of your appointments, please contact our office as soon as possible to reschedule.      DRIVING: Regarding driving, in patients with progressive memory problems, driving will be impaired. We advise to have someone else do the driving if trouble finding directions or if minor accidents are reported. Independent driving assessment is available to determine safety of driving.   If you are interested in the driving assessment, you can contact the following:  [employer] in [place] [phone]  [facility] [phone]  [facility] [phone]  [facility] [phone] or [phone]   FALL PRECAUTIONS: Be cautious when walking. Scan the area for obstacles that may increase the risk of trips and falls. When getting up in the mornings, sit up at the edge of the bed for a few minutes before getting out of bed. Consider elevating the bed at the head end to avoid drop of blood pressure when getting up. Walk always in a well-lit room (use night lights in the walls). Avoid area rugs or power cords from appliances in the middle of the walkways. Use a walker or a cane if necessary and consider physical therapy for balance exercise. Get your eyesight checked regularly.  FINANCIAL OVERSIGHT: Supervision, especially oversight when making financial decisions or transactions is also recommended.  HOME SAFETY: Consider the safety of the kitchen when operating appliances like stoves, microwave oven, and blender. Consider having supervision and share cooking responsibilities until no longer able to participate in those. Accidents with firearms and other hazards in the house should be identified and addressed as well.   ABILITY TO BE LEFT ALONE: If patient is unable to contact 911 operator, consider using LifeLine, or when the need  is there, arrange for someone to stay with patients. Smoking is a fire hazard, consider supervision or cessation. Risk of wandering should be assessed by caregiver and if detected at any point, supervision and safe proof recommendations should be instituted.  MEDICATION SUPERVISION: Inability to self-administer medication needs to be constantly addressed. Implement a mechanism to ensure safe administration of the medications.      Mediterranean Diet A Mediterranean diet refers to food and lifestyle choices that are based on the traditions of countries located on the [employer] [place]. This way of eating has been shown to help prevent certain conditions and improve outcomes for people who have chronic diseases, like kidney disease and heart disease. What are tips for following this plan? Lifestyle  Cook and eat meals together with your family, when possible. Drink enough fluid to keep your urine clear or pale yellow. Be physically active every day. This includes: Aerobic exercise like running or swimming. Leisure activities like gardening, walking, or housework. Get 7-8 hours of sleep each night. If recommended by your health care provider, drink red wine in moderation. This means 1 glass a day for nonpregnant women and 2 glasses a day for men. A glass of wine equals 5 oz (150 mL). Reading food labels  Check the serving size of packaged foods. For foods such as rice and pasta, the serving size refers to the amount of cooked product, not dry.  Check the total fat in packaged foods. Avoid foods that have saturated fat or trans fats. Check the ingredients list for added sugars, such as corn syrup. Shopping  At the grocery store, buy most of your food from the areas near the walls of the store. This includes: Fresh fruits and vegetables (produce). Grains, beans, nuts, and seeds. Some of these may be available in unpackaged forms or large amounts (in bulk). Fresh seafood. Poultry and  eggs. Low-fat dairy products. Buy whole ingredients instead of prepackaged foods. Buy fresh fruits and vegetables in-season from local farmers markets. Buy frozen fruits and vegetables in resealable bags. If you do not have access to quality fresh seafood, buy precooked frozen shrimp or canned fish, such as tuna, salmon, or sardines. Buy small amounts of raw or cooked vegetables, salads, or olives from the deli or salad bar at your store. Stock your pantry so you always have certain foods on hand, such as olive oil, canned tuna, canned tomatoes, rice, pasta, and beans. Cooking  Cook foods with extra-virgin olive oil instead of using butter or other vegetable oils. Have meat as a side dish, and have vegetables or grains as your main dish. This means having meat in small portions or adding small amounts of meat to foods like pasta or stew. Use beans or vegetables instead of meat in common dishes like chili or lasagna. Experiment with different cooking methods. Try roasting or broiling vegetables instead of steaming or sauteing them. Add frozen vegetables to soups, stews, pasta, or rice. Add nuts or seeds for added healthy fat at each meal. You can add these to yogurt, salads, or vegetable dishes. Marinate fish or vegetables using olive oil, lemon juice, garlic, and fresh herbs. Meal planning  Plan to eat 1 vegetarian meal one day each week. Try to work up to 2 vegetarian meals, if possible. Eat seafood 2 or more times a week. Have healthy snacks readily available, such as: Vegetable sticks with hummus. Greek yogurt. Fruit and nut trail mix. Eat balanced meals throughout the week. This includes: Fruit: 2-3 servings a day Vegetables: 4-5 servings a day Low-fat dairy: 2 servings a day Fish, poultry, or lean meat: 1 serving a day Beans and legumes: 2 or more servings a week Nuts and seeds: 1-2 servings a day Whole grains: 6-8 servings a day Extra-virgin olive oil: 3-4 servings a day Limit  red meat and sweets to only a few servings a month What are my food choices? Mediterranean diet Recommended Grains: Whole-grain pasta. Brown rice. Bulgar wheat. Polenta. Couscous. Whole-wheat bread. Orpah Cobb. Vegetables: Artichokes. Beets. Broccoli. Cabbage. Carrots. Eggplant. Green beans. Chard. Kale. Spinach. Onions. Leeks. Peas. Squash. Tomatoes. Peppers. Radishes. Fruits: Apples. Apricots. Avocado. Berries. Bananas. Cherries. Dates. Figs. Grapes. Lemons. Melon. Oranges. Peaches. Plums. Pomegranate. Meats and other protein foods: Beans. Almonds. Sunflower seeds. Pine nuts. Peanuts. Cod. Salmon. Scallops. Shrimp. Tuna. Tilapia. Clams. Oysters. Eggs. Dairy: Low-fat milk. Cheese. Greek yogurt. Beverages: Water. Red wine. Herbal tea. Fats and oils: Extra virgin olive oil. Avocado oil. Grape seed oil. Sweets and desserts: [place] yogurt with honey. Baked apples. Poached pears. Trail mix. Seasoning and other foods: Basil. Cilantro. Coriander. Cumin. Mint. Parsley. Sage. Rosemary. Tarragon. Garlic. Oregano. Thyme. Pepper. Balsalmic vinegar. Tahini. Hummus. Tomato sauce. Olives. Mushrooms. Limit these Grains: Prepackaged pasta or rice dishes. Prepackaged cereal with added sugar. Vegetables: Deep fried potatoes (french fries). Fruits: Fruit canned in syrup. Meats and other protein foods: Beef. Pork. Lamb. Poultry with skin. Hot dogs. Tomasa Blase. Dairy: Ice cream.  Sour cream. Whole milk. Beverages: Juice. Sugar-sweetened soft drinks. Beer. Liquor and spirits. Fats and oils: Butter. Canola oil. Vegetable oil. Beef fat (tallow). Lard. Sweets and desserts: Cookies. Cakes. Pies. Candy. Seasoning and other foods: Mayonnaise. Premade sauces and marinades. The items listed may not be a complete list. Talk with your dietitian about what dietary choices are right for you. Summary The Mediterranean diet includes both food and lifestyle choices. Eat a variety of fresh fruits and vegetables, beans, nuts,  seeds, and whole grains. Limit the amount of red meat and sweets that you eat. Talk with your health care provider about whether it is safe for you to drink red wine in moderation. This means 1 glass a day for nonpregnant women and 2 glasses a day for men. A glass of wine equals 5 oz (150 mL). This information is not intended to replace advice given to you by your health care provider. Make sure you discuss any questions you have with your health care provider. Document Released: 04/03/2016 Document Revised: 05/06/2016 Document Reviewed: 04/03/2016 Elsevier Interactive Patient Education  2017 [employer].
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
## 2023-10-27 ENCOUNTER — Encounter: Payer: Self-pay | Admitting: Internal Medicine

## 2023-10-27 ENCOUNTER — Ambulatory Visit: Payer: Medicare Other | Admitting: Internal Medicine

## 2023-10-27 VITALS — BP 124/88 | HR 105 | Temp 98.1°F | Resp 18 | Ht 68.0 in

## 2023-10-27 DIAGNOSIS — F02B2 Dementia in other diseases classified elsewhere, moderate, with psychotic disturbance: Secondary | ICD-10-CM | POA: Diagnosis not present

## 2023-10-27 DIAGNOSIS — I10 Essential (primary) hypertension: Secondary | ICD-10-CM | POA: Diagnosis not present

## 2023-10-27 DIAGNOSIS — G3183 Dementia with Lewy bodies: Secondary | ICD-10-CM | POA: Diagnosis not present

## 2023-10-27 DIAGNOSIS — I4891 Unspecified atrial fibrillation: Secondary | ICD-10-CM | POA: Diagnosis not present

## 2023-10-27 NOTE — Progress Notes (Unsigned)
 Office Visit  Subjective   Patient ID: Jason Farmer   DOB: Sep 05, 1934   Age: 89 y.o.   MRN: [id number]   Chief Complaint Chief Complaint  Patient presents with   Follow-up    2 week front   Sinusitis     History of Present Illness 89 years old male with history of Lewy body dementia who is here with his daughter for evaluation.  Daughter is worried that patient cannot live by himself alone and she has to come to stay with her.  Patient says that he is comfortable living alone.  Daughter is also worried that he Dr. And it is very stressful for the family.  Patient was admitted to hospital on February 1st because of hallucinations.  He was started on Seroquel and was discharged home.  He says that he does not have any more hallucinations.  He is forgetful and on his recall he has 1/3 words recall in 1 minutes.  He is taking Namenda 5 mg twice a day.     He also has running nose and frontal headache and says that was the main reason he went to hospital.  He feel pressure on his forehead.  He also feels dizziness and lightheadedness.  His dizziness started 6 weeks ago.  He says that when he was in hospital he was seen by 2 cardiologists bout atrial fibrillation.  They were discussing whether dizziness comes from metoprolol or not but they decided to continue with 50 mg twice a day because of Afib with with RVR.  He is also on amiodarone 200 mg 2 tablets  twice a day for 7 days then 2 tablets daily for 1 week and then 200 mg daily and his daughter will follow. He says that dizziness is almost every day day and night with frontal headache for almost 3 weeks now.     He also says that his biggest problem is that he cannot sleep at night.  He also has no appetite and anxiety bother him a lot.  He is taking mirtazapine 15 mg half tablet daily.     He also is complaining of constipation that also bother him.  Past Medical History Past Medical History:  Diagnosis Date   Allergy    Anxiety     "not a problem for a long time" (11/27/2016)   Arthritis    "wrists, knees, shoulders, back" (11/27/2016)   Chronic lower back pain    "5th disc is protruding" (11/27/2016)   Essential tremor    GERD (gastroesophageal reflux disease)    Glaucoma    Heart murmur    dx'd in [employer] in the 1960s; haven't been seen for it since "(11/27/2016)   Hepatitis ~ 1958   "jaundice kind"   HOH (hard of hearing)    Hyperlipemia    Migraine    "none since my neck OR" (11/27/2016)   Seasonal allergies    TIA (transient ischemic attack) 04/2015   Wears glasses    Wears hearing aid      Allergies Allergies  Allergen Reactions   Statins Other (See Comments)    Muscle cramps    Aspirin Other (See Comments)    bleeding     Review of Systems ROS     Objective:    Vitals BP 124/88 (BP Location: Left Arm, Patient Position: Sitting, Cuff Size: Normal)   Pulse (!) 105   Temp 98.1 F (36.7 C)   Resp 18   Ht 5\' 8"  (  1.727 m)   SpO2 98%   BMI 23.60 kg/m    Physical Examination Physical Exam     Assessment & Plan:   No problem-specific Assessment & Plan notes found for this encounter.    Return in about 2 years (around 10/26/2025).   Eloisa Northern, MD

## 2023-10-28 ENCOUNTER — Other Ambulatory Visit: Payer: Self-pay | Admitting: Internal Medicine

## 2023-10-28 NOTE — Assessment & Plan Note (Signed)
 His heart rate is controlled but he is taking per record both Eliquis and Pradaxa this is a duplication therapy.  I need to clarify with patient's family  As he is not sure which medicine he is taking.

## 2023-10-28 NOTE — Assessment & Plan Note (Signed)
 He will follow-up with neurologist and may need neuropsych evaluation.

## 2023-10-28 NOTE — Assessment & Plan Note (Signed)
His blood pressure is controlled.

## 2023-10-29 DIAGNOSIS — I4892 Unspecified atrial flutter: Secondary | ICD-10-CM | POA: Diagnosis not present

## 2023-11-02 ENCOUNTER — Other Ambulatory Visit: Payer: Self-pay

## 2023-11-02 MED ORDER — QUETIAPINE FUMARATE 25 MG PO TABS: 25.0000 mg | ORAL_TABLET | Freq: Every day | ORAL | 1 refills | Status: DC

## 2023-11-02 NOTE — Telephone Encounter (Signed)
 Pt's daughter Byrd Hesselbach [phone] returning call to a nurse about results

## 2023-11-03 NOTE — Telephone Encounter (Signed)
 Called patient's daughter back, about test results. Patient's daughter verbalized understanding.

## 2023-11-10 ENCOUNTER — Encounter (HOSPITAL_COMMUNITY)
Admission: RE | Admit: 2023-11-10 | Discharge: 2023-11-10 | Disposition: A | Discharge status: Home / Self Care | Source: Ambulatory Visit | Attending: Physician Assistant | Admitting: Physician Assistant

## 2023-11-10 DIAGNOSIS — F0280 Dementia in other diseases classified elsewhere without behavioral disturbance: Secondary | ICD-10-CM | POA: Diagnosis not present

## 2023-11-10 DIAGNOSIS — G3183 Dementia with Lewy bodies: Secondary | ICD-10-CM | POA: Diagnosis not present

## 2023-11-10 DIAGNOSIS — F0390 Unspecified dementia without behavioral disturbance: Secondary | ICD-10-CM | POA: Diagnosis not present

## 2023-11-10 MED ORDER — POTASSIUM IODIDE (ANTIDOTE) 130 MG PO TABS
ORAL_TABLET | ORAL | Status: AC
Start: 2023-11-10 — End: ?
  Filled 2023-11-10: qty 1

## 2023-11-10 MED ORDER — IOFLUPANE I 123 185 MBQ/2.5ML IV SOLN
5.0000 | Freq: Once | INTRAVENOUS | Status: AC | PRN
  Administered 2023-11-10: 4.66 via INTRAVENOUS

## 2023-11-11 ENCOUNTER — Telehealth: Payer: Self-pay | Admitting: Physician Assistant

## 2023-11-11 NOTE — Telephone Encounter (Signed)
 Datscan results are not available yet, pending will reach out about referrals they were placed.

## 2023-11-11 NOTE — Telephone Encounter (Signed)
 Pt. Daughter cld for DAT scan Results and would like to confirm the different orders for ENT and Psychiatry was sent for group but needs individual counseling, please call Daughter with results and other orders details

## 2023-11-11 NOTE — Telephone Encounter (Signed)
 No answer at 3:5 11/11/2023

## 2023-11-12 NOTE — Telephone Encounter (Signed)
 No answer at 8:30 11/12/2023

## 2023-11-12 NOTE — Telephone Encounter (Signed)
 Sent in new referral to Dr.Plovsky

## 2023-11-12 NOTE — Telephone Encounter (Signed)
 Has been notified, awaiting Dat scan results, she thanked me for calling.

## 2023-11-13 ENCOUNTER — Other Ambulatory Visit: Payer: Self-pay

## 2023-11-13 ENCOUNTER — Telehealth: Payer: Self-pay | Admitting: Physician Assistant

## 2023-11-13 DIAGNOSIS — F32A Depression, unspecified: Secondary | ICD-10-CM

## 2023-11-13 DIAGNOSIS — R634 Abnormal weight loss: Secondary | ICD-10-CM

## 2023-11-13 DIAGNOSIS — K219 Gastro-esophageal reflux disease without esophagitis: Secondary | ICD-10-CM

## 2023-11-13 MED ORDER — OMEPRAZOLE 20 MG PO CPDR: 20.0000 mg | DELAYED_RELEASE_CAPSULE | Freq: Every morning | ORAL | 0 refills | Status: DC

## 2023-11-13 MED ORDER — METOPROLOL TARTRATE 50 MG PO TABS: 50.0000 mg | ORAL_TABLET | Freq: Two times a day (BID) | ORAL | 0 refills | Status: DC

## 2023-11-13 MED ORDER — MIRTAZAPINE 15 MG PO TABS: 15.0000 mg | ORAL_TABLET | Freq: Every evening | ORAL | 0 refills | Status: DC

## 2023-11-13 MED ORDER — EZETIMIBE 10 MG PO TABS: 10.0000 mg | ORAL_TABLET | Freq: Every evening | ORAL | 0 refills | Status: DC

## 2023-11-13 MED ORDER — METOPROLOL TARTRATE 50 MG PO TABS: 50.0000 mg | ORAL_TABLET | Freq: Two times a day (BID) | ORAL | 0 refills | Status: AC

## 2023-11-13 NOTE — Telephone Encounter (Signed)
 Pt calling back for results of DAT scan

## 2023-11-13 NOTE — Telephone Encounter (Signed)
 Results are not avaible, may be up to two weeks.

## 2023-11-25 NOTE — Progress Notes (Signed)
 DAT scan normal. No Lewy Body disease. Thanks

## 2023-12-12 ENCOUNTER — Other Ambulatory Visit: Payer: Self-pay | Admitting: Internal Medicine

## 2023-12-16 ENCOUNTER — Other Ambulatory Visit: Payer: Self-pay

## 2023-12-16 DIAGNOSIS — R634 Abnormal weight loss: Secondary | ICD-10-CM

## 2023-12-16 DIAGNOSIS — K219 Gastro-esophageal reflux disease without esophagitis: Secondary | ICD-10-CM

## 2023-12-16 DIAGNOSIS — F419 Anxiety disorder, unspecified: Secondary | ICD-10-CM

## 2023-12-16 MED ORDER — OMEPRAZOLE 20 MG PO CPDR: 20.0000 mg | DELAYED_RELEASE_CAPSULE | Freq: Every morning | ORAL | 0 refills | Status: DC

## 2023-12-16 MED ORDER — EZETIMIBE 10 MG PO TABS: 10.0000 mg | ORAL_TABLET | Freq: Every evening | ORAL | 0 refills | Status: DC

## 2023-12-16 MED ORDER — MIRTAZAPINE 15 MG PO TABS: 15.0000 mg | ORAL_TABLET | Freq: Every evening | ORAL | 0 refills | Status: DC

## 2023-12-17 ENCOUNTER — Other Ambulatory Visit: Payer: Self-pay

## 2023-12-17 MED ORDER — FEXOFENADINE HCL 180 MG PO TABS
180.0000 mg | ORAL_TABLET | Freq: Every day | ORAL | 2 refills | Status: AC
Start: 2023-12-17 — End: ?

## 2023-12-29 ENCOUNTER — Encounter: Payer: Self-pay | Admitting: Internal Medicine

## 2023-12-29 ENCOUNTER — Ambulatory Visit: Admitting: Internal Medicine

## 2023-12-29 VITALS — BP 120/70 | HR 61 | Temp 98.1°F | Resp 18 | Ht 68.0 in | Wt 150.0 lb

## 2023-12-29 DIAGNOSIS — F02B2 Dementia in other diseases classified elsewhere, moderate, with psychotic disturbance: Secondary | ICD-10-CM | POA: Diagnosis not present

## 2023-12-29 DIAGNOSIS — I480 Paroxysmal atrial fibrillation: Secondary | ICD-10-CM

## 2023-12-29 DIAGNOSIS — E785 Hyperlipidemia, unspecified: Secondary | ICD-10-CM

## 2023-12-29 DIAGNOSIS — G3183 Dementia with Lewy bodies: Secondary | ICD-10-CM | POA: Diagnosis not present

## 2023-12-29 DIAGNOSIS — I10 Essential (primary) hypertension: Secondary | ICD-10-CM | POA: Diagnosis not present

## 2023-12-29 NOTE — Progress Notes (Signed)
 Office Visit  Subjective   Patient ID: Jason Farmer   DOB: 08/13/1935   Age: 89 y.o.   MRN: [id number]   Chief Complaint Chief Complaint  Patient presents with   Follow-up    2 month follow up     History of Present Illness 89 years old male who is here for follow up. He has Lewy body dementia but he could not have neuropsych evaluation. He says that he has not seen neurologist since last visit. He is a widow who live alone. He says that he drives locally and he do not feel any reason not to drive. Patient says that he is living alone since his wife died few months ago.  He was started on Seroquel   because of hallucinations.  He is not sure about which medicine he is taking and which medicine he does not.  He is forgetful and on his recall he has 1/3 words recall in 1 minutes.  And I have increase the dose of Namenda  to 10 mg twice a day.  He seems reasonable today on conversation.  No family member was present during this visit.     He has depression and insomnia and per record he take escitalopram  10 mg and mirtazapine  50 mg at night.  I am not sure if he take both Remeron  and Seroquel  25 mg at bedtime.  I need to discuss that with his family.   He has atrial  fibrillation and he take amiodarone  200 mg daily.  He also take metoprolol  50 mg twice a day.  He has both Pradaxa  and Eliquis  on his medication,  He need to have either Eliquis  or Pradaxa  and I need to discuss that with his family as well.  Past Medical History Past Medical History:  Diagnosis Date   Allergy    Anxiety    "not a problem for a long time" (11/27/2016)   Arthritis    "wrists, knees, shoulders, back" (11/27/2016)   Chronic lower back pain    "5th disc is protruding" (11/27/2016)   Essential tremor    GERD (gastroesophageal reflux disease)    Glaucoma    Heart murmur    dx'd in [employer] in the 1960s; haven't been seen for it since "(11/27/2016)   Hepatitis ~ 1958   "jaundice kind"   HOH (hard of hearing)     Hyperlipemia    Migraine    "none since my neck OR" (11/27/2016)   Seasonal allergies    TIA (transient ischemic attack) 04/2015   Wears glasses    Wears hearing aid      Allergies Allergies  Allergen Reactions   Statins Other (See Comments)    Muscle cramps    Aspirin  Other (See Comments)    bleeding     Review of Systems Review of Systems  Constitutional: Negative.   Respiratory: Negative.    Cardiovascular: Negative.   Gastrointestinal: Negative.   Neurological:  Positive for weakness.       Objective:    Vitals BP 120/70   Pulse 61   Temp 98.1 F (36.7 C)   Resp 18   Ht 5\' 8"  (1.727 m)   Wt 150 lb (68 kg)   SpO2 98%   BMI 22.81 kg/m    Physical Examination Physical Exam Constitutional:      Appearance: Normal appearance.  HENT:     Head: Normocephalic and atraumatic.  Cardiovascular:     Rate and Rhythm: Normal rate and regular rhythm.  Heart sounds: Normal heart sounds.  Pulmonary:     Effort: Pulmonary effort is normal.     Breath sounds: Normal breath sounds.  Abdominal:     General: Bowel sounds are normal.     Palpations: Abdomen is soft.  Neurological:     General: No focal deficit present.     Mental Status: He is alert and oriented to person, place, and time.        Assessment & Plan:   Paroxysmal atrial fibrillation (HCC) Rate is controlled,  He is on Pradaxa  150 mg twice a day for anticoagulation.  Essential hypertension, benign   His blood pressure is controlled.  Hyperlipidemia  He takes Zetia  10 mg daily and will continue to monitor.    Return in about 3 months (around 03/30/2024).   Tita Form, MD

## 2024-01-11 ENCOUNTER — Ambulatory Visit (HOSPITAL_COMMUNITY): Payer: Medicare Other | Attending: Physician Assistant | Admitting: Physician Assistant

## 2024-01-11 ENCOUNTER — Encounter (HOSPITAL_COMMUNITY): Payer: Self-pay

## 2024-01-18 ENCOUNTER — Other Ambulatory Visit: Payer: Self-pay | Admitting: Internal Medicine

## 2024-01-18 DIAGNOSIS — F419 Anxiety disorder, unspecified: Secondary | ICD-10-CM

## 2024-01-18 DIAGNOSIS — R634 Abnormal weight loss: Secondary | ICD-10-CM

## 2024-01-18 DIAGNOSIS — K219 Gastro-esophageal reflux disease without esophagitis: Secondary | ICD-10-CM

## 2024-01-23 NOTE — Assessment & Plan Note (Addendum)
 Rate is controlled,  He is on Pradaxa  150 mg twice a day for anticoagulation.

## 2024-01-23 NOTE — Assessment & Plan Note (Signed)
His blood pressure is controlled.

## 2024-01-23 NOTE — Assessment & Plan Note (Signed)
 He takes Zetia  10 mg daily and will continue to monitor.

## 2024-01-26 ENCOUNTER — Emergency Department (HOSPITAL_BASED_OUTPATIENT_CLINIC_OR_DEPARTMENT_OTHER)
Admission: EM | Admit: 2024-01-26 | Discharge: 2024-01-26 | Disposition: A | Discharge status: Home / Self Care | Attending: Emergency Medicine | Admitting: Emergency Medicine

## 2024-01-26 ENCOUNTER — Ambulatory Visit: Payer: Self-pay

## 2024-01-26 ENCOUNTER — Emergency Department (HOSPITAL_BASED_OUTPATIENT_CLINIC_OR_DEPARTMENT_OTHER): Admitting: Radiology

## 2024-01-26 ENCOUNTER — Other Ambulatory Visit: Payer: Self-pay

## 2024-01-26 ENCOUNTER — Emergency Department (HOSPITAL_BASED_OUTPATIENT_CLINIC_OR_DEPARTMENT_OTHER)

## 2024-01-26 DIAGNOSIS — R319 Hematuria, unspecified: Secondary | ICD-10-CM | POA: Insufficient documentation

## 2024-01-26 DIAGNOSIS — R058 Other specified cough: Secondary | ICD-10-CM | POA: Diagnosis not present

## 2024-01-26 DIAGNOSIS — J8410 Pulmonary fibrosis, unspecified: Secondary | ICD-10-CM | POA: Diagnosis not present

## 2024-01-26 DIAGNOSIS — J189 Pneumonia, unspecified organism: Secondary | ICD-10-CM | POA: Insufficient documentation

## 2024-01-26 DIAGNOSIS — J168 Pneumonia due to other specified infectious organisms: Secondary | ICD-10-CM | POA: Diagnosis not present

## 2024-01-26 DIAGNOSIS — R918 Other nonspecific abnormal finding of lung field: Secondary | ICD-10-CM | POA: Diagnosis not present

## 2024-01-26 DIAGNOSIS — I517 Cardiomegaly: Secondary | ICD-10-CM | POA: Diagnosis not present

## 2024-01-26 DIAGNOSIS — R0602 Shortness of breath: Secondary | ICD-10-CM | POA: Diagnosis not present

## 2024-01-26 LAB — HEPATIC FUNCTION PANEL
ALT: 20 U/L (ref 0–44)
AST: 24 U/L (ref 15–41)
Albumin: 3.7 g/dL (ref 3.5–5.0)
Alkaline Phosphatase: 94 U/L (ref 38–126)
Bilirubin, Direct: 0.2 mg/dL (ref 0.0–0.2)
Indirect Bilirubin: 0.2 mg/dL — ABNORMAL LOW (ref 0.3–0.9)
Total Bilirubin: 0.4 mg/dL (ref 0.0–1.2)
Total Protein: 7.2 g/dL (ref 6.5–8.1)

## 2024-01-26 LAB — CBC
HCT: 44.7 % (ref 39.0–52.0)
Hemoglobin: 14.5 g/dL (ref 13.0–17.0)
MCH: 29.7 pg (ref 26.0–34.0)
MCHC: 32.4 g/dL (ref 30.0–36.0)
MCV: 91.4 fL (ref 80.0–100.0)
Platelets: 341 10*3/uL (ref 150–400)
RBC: 4.89 MIL/uL (ref 4.22–5.81)
RDW: 13.9 % (ref 11.5–15.5)
WBC: 8.4 10*3/uL (ref 4.0–10.5)
nRBC: 0 % (ref 0.0–0.2)

## 2024-01-26 LAB — BASIC METABOLIC PANEL WITH GFR
Anion gap: 13 (ref 5–15)
BUN: 18 mg/dL (ref 8–23)
CO2: 24 mmol/L (ref 22–32)
Calcium: 9.9 mg/dL (ref 8.9–10.3)
Chloride: 103 mmol/L (ref 98–111)
Creatinine, Ser: 1.63 mg/dL — ABNORMAL HIGH (ref 0.61–1.24)
GFR, Estimated: 41 mL/min — ABNORMAL LOW (ref >60)
Glucose, Bld: 119 mg/dL — ABNORMAL HIGH (ref 70–99)
Potassium: 4.9 mmol/L (ref 3.5–5.1)
Sodium: 139 mmol/L (ref 135–145)

## 2024-01-26 LAB — RESP PANEL BY RT-PCR (RSV, FLU A&B, COVID)  RVPGX2
Influenza A by PCR: NEGATIVE
Influenza B by PCR: NEGATIVE
Resp Syncytial Virus by PCR: NEGATIVE
SARS Coronavirus 2 by RT PCR: NEGATIVE

## 2024-01-26 LAB — URINALYSIS, W/ REFLEX TO CULTURE (INFECTION SUSPECTED)
Bacteria, UA: NONE SEEN
Bilirubin Urine: NEGATIVE
Glucose, UA: NEGATIVE mg/dL
Leukocytes,Ua: NEGATIVE
Nitrite: NEGATIVE
Protein, ur: 30 mg/dL — AB
Specific Gravity, Urine: 1.019 (ref 1.005–1.030)
pH: 6 (ref 5.0–8.0)

## 2024-01-26 MED ORDER — SODIUM CHLORIDE 0.9 % IV BOLUS
500.0000 mL | Freq: Once | INTRAVENOUS | Status: AC
  Administered 2024-01-26: 500 mL via INTRAVENOUS

## 2024-01-26 MED ORDER — DOXYCYCLINE HYCLATE 100 MG PO CAPS
100.0000 mg | ORAL_CAPSULE | Freq: Two times a day (BID) | ORAL | 0 refills | Status: DC
Start: 2024-01-26 — End: 2024-02-04

## 2024-01-26 MED ORDER — IOHEXOL 350 MG/ML SOLN
100.0000 mL | Freq: Once | INTRAVENOUS | Status: AC | PRN
  Administered 2024-01-26: 75 mL via INTRAVENOUS

## 2024-01-26 MED ORDER — DOXYCYCLINE HYCLATE 100 MG PO TABS
100.0000 mg | ORAL_TABLET | Freq: Once | ORAL | Status: AC
  Administered 2024-01-26: 100 mg via ORAL
  Filled 2024-01-26: qty 1

## 2024-01-26 NOTE — ED Notes (Signed)
 Provider to bedside

## 2024-01-26 NOTE — ED Notes (Signed)
 NT to encourage pt for urine sample

## 2024-01-26 NOTE — Telephone Encounter (Signed)
 Chief Complaint: hemoptysis  Symptoms: hemoptysis, lightheadedness Frequency: days to weeks Pertinent Negatives: Patient denies fever, vomiting  Disposition: [x] ED /[] Urgent Care (no appt availability in office) / [] Appointment(In office/virtual)/ []  [facility]/ [] Home Care/ [] Refused Recommended Disposition /[] [facility]/ []  Follow-up with PCP Additional Notes: RN spoke to daughter Shelagh Derrick. Vergia Glasgow states that she has noticed pt has been coughing up bloody red/pink sputum. Shelagh Derrick has noticed the sputum on the pt's tissues. Shelagh Derrick is not sure if the bloody sputum is coming from the pt's lungs or sinuses. Pt has a hx of recurring sinus infections. Pt has not been feeling well lately and has spent a few days in bed. Pt states he feels "woozy" and lightheaded. Shelagh Derrick states she called the pt this AM and he sounded hoarse and out of breath. Shelagh Derrick is not sure if pt has CP. Pt takes Pradaxa  but Shelagh Derrick states she is not sure if he is taking it all the time. RN advised Shelagh Derrick pt needs to go to the ED for evaluation of symptoms. Shelagh Derrick agreeable to that plan. RN advised Shelagh Derrick if the pt has CP, worsening SOB, or any new or worsening symptoms to call 911. Shelagh Derrick verbalized understanding.    Copied from CRM [id number]. Topic: Clinical - Red Word Triage >> Jan 26, 2024 10:54 AM Magdalene School wrote: Red Word that prompted transfer to Nurse Triage: woozy for a few days and coughing blood Reason for Disposition  Patient sounds very sick or weak to the triager  Answer Assessment - Initial Assessment Questions 1. ONSET: "When did the cough begin?"      "For a little while"; daughter states she thinks this is new  2. SEVERITY: "How bad is the cough today?" "Did the blood appear after a coughing spell?"      Just got back from being at the beach, daughter does not know if he was coughing more there at the beach; recurring sinus infections 3. SPUTUM: "Describe the color of your sputum" (none, dry cough; clear,  white, yellow, green)     "I don't know for sure, I think it's red and pink" 4. HEMOPTYSIS: "How much blood?" (flecks, streaks, tablespoons, etc.)     On tissues, on handkerchief  5. DIFFICULTY BREATHING: "Are you having difficulty breathing?" If Yes, ask: "How bad is it?" (e.g., mild, moderate, severe)    - MILD: No SOB at rest, mild SOB with walking, speaks normally in sentences, can lie down, no retractions, pulse < 100.    - MODERATE: SOB at rest, SOB with minimal exertion and prefers to sit, cannot lie down flat, speaks in phrases, mild retractions, audible wheezing, pulse 100-120.    - SEVERE: Very SOB at rest, speaks in single words, struggling to breathe, sitting hunched forward, retractions, pulse > 120      Daughter is not there this AM; daughter spoke to pt on the phone this AM; he sounds hoarse and out of breath, but he got up to eat soup 6. FEVER: "Do you have a fever?" If Yes, ask: "What is your temperature, how was it measured, and when did it start?"     no 7. CARDIAC HISTORY: "Do you have any history of heart disease?" (e.g., heart attack, congestive heart failure)      HTN, Afib, HTN - takes blood thinner (won't put this medication in a blister pack - not taking this one regularly) 8. LUNG HISTORY: "Do you have any history of lung disease?"  (e.g., pulmonary embolus, asthma,  emphysema)     Rhinitis  9. PE RISK FACTORS: "Do you have a history of blood clots?" (or: recent major surgery, recent prolonged travel, bedridden)     no 10. OTHER SYMPTOMS: "Do you have any other symptoms?" (e.g., runny nose, wheezing, chest pain)       Bright red blood, possibly mixed with sputum, seen on tissues and handkerchief; daughter not sure if this is coming from his nose or his lungs. Been in bed for a couple days. "Voice sounds bad." Grandson came to visit, he couldn't get out of bed   Pt feeling "woozy" - says this when he doesn't feel well. Possible sinus pressure w/ lightheadedness, unsteady  - for years. More frequent now.  Protocols used: Coughing Up Blood-A-AH

## 2024-01-26 NOTE — ED Triage Notes (Signed)
 Patient noticed coughing up bloody sputum x 1 week. Hx of reoccurring sinus infections. Endorses hoarseness. Denies CP or SHOB.   When asking patient about timeline of symptoms, patient states he has "been feeling this way for 5 years"

## 2024-01-26 NOTE — Discharge Instructions (Addendum)
 Take antibiotics as prescribed and complete the full course.  Follow up with urology, call to schedule an appointment for follow up blood in urine.  Follow up with your primary care provider to review today's results, discuss referral to pulmonology.

## 2024-01-26 NOTE — ED Provider Notes (Signed)
 [place] EMERGENCY DEPARTMENT AT [facility] Provider Note   CSN: [id number] Arrival date & time: 01/26/24  1248     History  Chief Complaint  Patient presents with   Shortness of Breath   Hemoptysis    Jason Farmer is a 89 y.o. male.  89 year old male brought in by daughter with concern for generalized weakness, cough with blood-tinged sputum, hoarseness, shortness of breath, loss of appetite.  Patient with history of A-fib, on Pradaxa , has missed a few days of Pradaxa  while visiting family at the beach.  Symptoms started about a week ago.  Patient also with history of hallucinations which had improved however reports illusions have returned described as patient noting a television in the room where there is no television.  Patient lives alone. Past medical history of GERD, hyperlipidemia, TIA, glaucoma, murmur, A-fib.       Home Medications Prior to Admission medications   Medication Sig Start Date End Date Taking? Authorizing Provider  doxycycline  (VIBRAMYCIN ) 100 MG capsule Take 1 capsule (100 mg total) by mouth 2 (two) times daily. 01/26/24  Yes Darlis Eisenmenger, PA-C  memantine  (NAMENDA ) 5 MG tablet Take 5 mg by mouth 2 (two) times daily. 01/20/24  Yes [provider]  acetaminophen  (TYLENOL ) 325 MG tablet Take 325 mg by mouth every 6 (six) hours as needed for moderate pain or headache.    [provider]  amiodarone  (PACERONE ) 200 MG tablet Take 1 tablet (200 mg total) by mouth daily. 10/12/23   Fenton, Clint R, PA  dabigatran  (PRADAXA ) 150 MG CAPS capsule Take 1 capsule (150 mg total) by mouth 2 (two) times daily. 10/12/23   Fenton, Clint R, PA  escitalopram  (LEXAPRO ) 10 MG tablet Take 1 tablet (10 mg total) by mouth daily. 07/14/23   Wayne Haines, MD  ezetimibe  (ZETIA ) 10 MG tablet TAKE ONE TABLET BY MOUTH IN THE EVENING 01/19/24   Amin, Saad, MD  fexofenadine  (ALLEGRA ) 180 MG tablet Take 1 tablet (180 mg total) by mouth daily. 12/17/23   Wayne Haines, MD  fluticasone  (FLONASE ) 50 MCG/ACT nasal spray Place 1 spray into both nostrils in the morning and at bedtime. Patient taking differently: Place 1 spray into both nostrils daily as needed for allergies. 08/13/23 08/12/24  Wayne Haines, MD  latanoprost  (XALATAN ) 0.005 % ophthalmic solution Place 1 drop into both eyes at bedtime.     [provider]  meclizine  (ANTIVERT ) 12.5 MG tablet Take 1 tablet (12.5 mg total) by mouth 3 (three) times daily as needed for dizziness. 08/13/23   Wayne Haines, MD  memantine  (NAMENDA ) 10 MG tablet Take 1 tablet (10 mg total) by mouth 2 (two) times daily. 10/07/23   Amin, Saad, MD  metoprolol  tartrate (LOPRESSOR ) 50 MG tablet Take 1 tablet (50 mg total) by mouth 2 (two) times daily. 11/13/23   Amin, Saad, MD  mirtazapine  (REMERON ) 15 MG tablet Take 1 tablet (15 mg total) by mouth every evening. 01/19/24   Tita Form, MD  omeprazole  (PRILOSEC) 20 MG capsule Take 1 capsule (20 mg total) by mouth every morning. 01/19/24   Amin, Saad, MD  polyethylene glycol (MIRALAX  / GLYCOLAX ) 17 g packet Take 17 g by mouth daily. 10/06/23   Amin, Saad, MD  QUEtiapine  (SEROQUEL ) 25 MG tablet Take 1 tablet (25 mg total) by mouth at bedtime. 12/14/23   Wayne Haines, MD  sodium chloride  (OCEAN) 0.65 % nasal spray Place 1 spray into the nose as needed for  congestion. 10/06/23   Amin, Saad, MD      Allergies    Statins and Aspirin     Review of Systems   Review of Systems Negative except as per HPI Physical Exam Updated Vital Signs BP 120/84 (BP Location: Left Arm)   Pulse (!) 56   Temp 98.5 F (36.9 C) (Oral)   Resp 18   SpO2 95%  Physical Exam Vitals and nursing note reviewed.  Constitutional:      General: He is not in acute distress.    Appearance: He is well-developed. He is not diaphoretic.  HENT:     Head: Normocephalic and atraumatic.     Nose: Congestion present.     Mouth/Throat:     Mouth: Mucous membranes are moist.     Pharynx: No  oropharyngeal exudate.  Eyes:     Conjunctiva/sclera: Conjunctivae normal.  Cardiovascular:     Rate and Rhythm: Normal rate and regular rhythm.     Pulses: Normal pulses.  Pulmonary:     Effort: Pulmonary effort is normal.     Breath sounds: Normal breath sounds.  Chest:     Chest wall: No tenderness.  Abdominal:     Palpations: Abdomen is soft.     Tenderness: There is no abdominal tenderness.  Musculoskeletal:     Cervical back: Neck supple.     Right lower leg: No tenderness. No edema.     Left lower leg: No tenderness. No edema.  Skin:    General: Skin is warm and dry.  Neurological:     Mental Status: He is alert and oriented to person, place, and time.  Psychiatric:        Behavior: Behavior normal.     ED Results / Procedures / Treatments   Labs (all labs ordered are listed, but only abnormal results are displayed) Labs Reviewed  BASIC METABOLIC PANEL WITH GFR - Abnormal; Notable for the following components:      Result Value   Glucose, Bld 119 (*)    Creatinine, Ser 1.63 (*)    GFR, Estimated 41 (*)    All other components within normal limits  URINALYSIS, W/ REFLEX TO CULTURE (INFECTION SUSPECTED) - Abnormal; Notable for the following components:   Hgb urine dipstick MODERATE (*)    Ketones, ur TRACE (*)    Protein, ur 30 (*)    All other components within normal limits  HEPATIC FUNCTION PANEL - Abnormal; Notable for the following components:   Indirect Bilirubin 0.2 (*)    All other components within normal limits  RESP PANEL BY RT-PCR (RSV, FLU A&B, COVID)  RVPGX2  CBC    EKG None  Radiology CT Angio Chest PE W/Cm &/Or Wo Cm Result Date: 01/26/2024 CLINICAL DATA:  High probability for PE.  Shortness of breath. EXAM: CT ANGIOGRAPHY CHEST WITH CONTRAST TECHNIQUE: Multidetector CT imaging of the chest was performed using the standard protocol during bolus administration of intravenous contrast. Multiplanar CT image reconstructions and MIPs were obtained  to evaluate the vascular anatomy. RADIATION DOSE REDUCTION: This exam was performed according to the departmental dose-optimization program which includes automated exposure control, adjustment of the mA and/or kV according to patient size and/or use of iterative reconstruction technique. CONTRAST:  75mL OMNIPAQUE  IOHEXOL  350 MG/ML SOLN COMPARISON:  CT of the chest 08/01/2022. FINDINGS: Cardiovascular: Satisfactory opacification of the pulmonary arteries to the segmental level. No evidence of pulmonary embolism. The heart is mildly enlarged. No pericardial effusion. Mediastinum/Nodes: No enlarged mediastinal, hilar, or  axillary lymph nodes. Thyroid  gland, trachea, and esophagus demonstrate no significant findings. Lungs/Pleura: Peripheral fibrotic changes have significantly increased, particularly in the left upper lobe and left lower lobe. Bullae are again noted peripherally. There is some superimposed ground-glass opacities in the left lower lobe and minimally left upper lobe. There is no consolidation, pneumothorax or pleural effusion. Nodular scarring in the right lung apex measures up to 10 mm, unchanged from prior. Upper Abdomen: No acute abnormality. Musculoskeletal: Cervical spinal fusion plate is present. There are degenerative changes of the spine. No acute fractures are identified. Review of the MIP images confirms the above findings. IMPRESSION: 1. No evidence for pulmonary embolism. 2. Significant increase in peripheral fibrotic changes, particularly in the left upper lobe and left lower lobe. 3. Superimposed ground-glass opacities in the left lower lobe and minimally left upper lobe may be infectious/inflammatory. 4. Stable 10 mm nodular scarring in the right lung apex. Electronically Signed   By: Tyron Gallon M.D.   On: 01/26/2024 16:58   DG Chest 2 View Result Date: 01/26/2024 CLINICAL DATA:  Productive cough for 1 week.  Bloody sputum. EXAM: CHEST - 2 VIEW COMPARISON:  September 26, 2023. FINDINGS:  The heart size and mediastinal contours are within normal limits. Grossly stable reticular densities are noted throughout both lungs most consistent with chronic interstitial lung disease or fibrosis. No definite acute abnormality seen. The visualized skeletal structures are unremarkable. IMPRESSION: Stable chronic findings as noted above. Electronically Signed   By: Rosalene Colon M.D.   On: 01/26/2024 15:14    Procedures Procedures    Medications Ordered in ED Medications  sodium chloride  0.9 % bolus 500 mL ( Intravenous Stopped 01/26/24 1705)  iohexol  (OMNIPAQUE ) 350 MG/ML injection 100 mL (75 mLs Intravenous Contrast Given 01/26/24 1519)  doxycycline  (VIBRA -TABS) tablet 100 mg (100 mg Oral Given 01/26/24 1730)    ED Course/ Medical Decision Making/ A&P                                 Medical Decision Making Amount and/or Complexity of Data Reviewed Labs: ordered. Radiology: ordered.  Risk Prescription drug management.   This patient presents to the ED for concern of fatigue, cough, congestion, hemoptysis, this involves an extensive number of treatment options, and is a complaint that carries with it a high risk of complications and morbidity.  The differential diagnosis includes but not limited to PE, bronchitis, lung mass, sinusitis, urinary tract infection, anemia, metabolic or electrolyte derangement   Co morbidities / Chronic conditions that complicate the patient evaluation  GERD, hyperlipidemia, TIA, glaucoma, murmur, A-fib   Additional history obtained:  Additional history obtained from EMR Additional history provided by daughter at bedside External records from outside source obtained and reviewed including prior labs and imaging on file   Lab Tests:  I Ordered, and personally interpreted labs.  The pertinent results include: Hepatic function without significant findings.  Urinalysis with hemoglobin, same on prior results, has not seen urology, will provide referral  today.  Viral swab negative for COVID, flu, RSV.  BMP with creatinine 1.63, similar slightly increased compared to prior on file.  CBC within normals.   Imaging Studies ordered:  I ordered imaging studies including chest x-ray, CTA chest I independently visualized and interpreted imaging which showed possible PNA, pulmonary fibrosis  I agree with the radiologist interpretation   Cardiac Monitoring: / EKG:  The patient was maintained on a cardiac  monitor.  I personally viewed and interpreted the cardiac monitored which showed an underlying rhythm of: Sinus bradycardia, rate 54   Problem List / ED Course / Critical interventions / Medication management  89 year old male brought in by daughter with concerns as above.  Workup today with chronic findings with pulmonary fibrosis although possibility of infection, will cover with antibiotics for pneumonia.  Hematuria on urinalysis, consistent on prior evaluations and not sent for urology referral.  Provided with referral to urology today.  Copies of today's results provided to patient and daughter, reviewed results with same, questions answered.  Recommend taking results to PCP for follow-up to review, consider pulmonology referral. I ordered medication including doxycycline  for pneumonia Reevaluation of the patient after these medicines showed that the patient stable I have reviewed the patients home medicines and have made adjustments as needed   Social Determinants of Health:  Lives at [facility].  Has PCP   Test / Admission - Considered:  Stable for discharge         Final Clinical Impression(s) / ED Diagnoses Final diagnoses:  Pneumonia of left lung due to infectious organism, unspecified part of lung  Hematuria, unspecified type  Pulmonary fibrosis (HCC)    Rx / DC Orders ED Discharge Orders          Ordered    doxycycline  (VIBRAMYCIN ) 100 MG capsule  2 times daily        01/26/24 1719              Darlis Eisenmenger, PA-C 01/26/24 Yale Held, MD 02/02/24 [phone]

## 2024-02-02 ENCOUNTER — Encounter: Payer: Self-pay | Admitting: Internal Medicine

## 2024-02-02 ENCOUNTER — Ambulatory Visit: Admitting: Internal Medicine

## 2024-02-02 ENCOUNTER — Other Ambulatory Visit: Payer: Self-pay

## 2024-02-02 ENCOUNTER — Encounter (HOSPITAL_COMMUNITY): Payer: Self-pay | Admitting: Emergency Medicine

## 2024-02-02 ENCOUNTER — Emergency Department (HOSPITAL_COMMUNITY)

## 2024-02-02 ENCOUNTER — Inpatient Hospital Stay (HOSPITAL_COMMUNITY)
Admission: EM | Admit: 2024-02-02 | Discharge: 2024-02-04 | DRG: 196 | Disposition: A | Discharge status: Skilled Nursing Facility | Source: Skilled Nursing Facility | Attending: Internal Medicine | Admitting: Internal Medicine

## 2024-02-02 VITALS — BP 126/80 | HR 74 | Temp 97.0°F | Resp 18 | Wt 140.0 lb

## 2024-02-02 DIAGNOSIS — G250 Essential tremor: Secondary | ICD-10-CM | POA: Diagnosis not present

## 2024-02-02 DIAGNOSIS — Z87891 Personal history of nicotine dependence: Secondary | ICD-10-CM

## 2024-02-02 DIAGNOSIS — G3183 Dementia with Lewy bodies: Secondary | ICD-10-CM | POA: Diagnosis present

## 2024-02-02 DIAGNOSIS — R278 Other lack of coordination: Secondary | ICD-10-CM | POA: Diagnosis not present

## 2024-02-02 DIAGNOSIS — M6281 Muscle weakness (generalized): Secondary | ICD-10-CM | POA: Diagnosis not present

## 2024-02-02 DIAGNOSIS — Z961 Presence of intraocular lens: Secondary | ICD-10-CM | POA: Diagnosis present

## 2024-02-02 DIAGNOSIS — R2689 Other abnormalities of gait and mobility: Secondary | ICD-10-CM | POA: Diagnosis not present

## 2024-02-02 DIAGNOSIS — R0689 Other abnormalities of breathing: Secondary | ICD-10-CM | POA: Diagnosis not present

## 2024-02-02 DIAGNOSIS — I4891 Unspecified atrial fibrillation: Secondary | ICD-10-CM | POA: Diagnosis present

## 2024-02-02 DIAGNOSIS — J849 Interstitial pulmonary disease, unspecified: Secondary | ICD-10-CM | POA: Diagnosis not present

## 2024-02-02 DIAGNOSIS — R4182 Altered mental status, unspecified: Secondary | ICD-10-CM | POA: Diagnosis not present

## 2024-02-02 DIAGNOSIS — Z888 Allergy status to other drugs, medicaments and biological substances status: Secondary | ICD-10-CM | POA: Diagnosis not present

## 2024-02-02 DIAGNOSIS — I2510 Atherosclerotic heart disease of native coronary artery without angina pectoris: Secondary | ICD-10-CM | POA: Diagnosis not present

## 2024-02-02 DIAGNOSIS — R1310 Dysphagia, unspecified: Secondary | ICD-10-CM | POA: Diagnosis present

## 2024-02-02 DIAGNOSIS — Z79899 Other long term (current) drug therapy: Secondary | ICD-10-CM | POA: Diagnosis not present

## 2024-02-02 DIAGNOSIS — Z818 Family history of other mental and behavioral disorders: Secondary | ICD-10-CM | POA: Diagnosis not present

## 2024-02-02 DIAGNOSIS — N1831 Chronic kidney disease, stage 3a: Secondary | ICD-10-CM

## 2024-02-02 DIAGNOSIS — R059 Cough, unspecified: Secondary | ICD-10-CM | POA: Diagnosis not present

## 2024-02-02 DIAGNOSIS — Z886 Allergy status to analgesic agent status: Secondary | ICD-10-CM | POA: Diagnosis not present

## 2024-02-02 DIAGNOSIS — Z7401 Bed confinement status: Secondary | ICD-10-CM | POA: Diagnosis not present

## 2024-02-02 DIAGNOSIS — K222 Esophageal obstruction: Secondary | ICD-10-CM | POA: Diagnosis present

## 2024-02-02 DIAGNOSIS — Z83511 Family history of glaucoma: Secondary | ICD-10-CM

## 2024-02-02 DIAGNOSIS — I480 Paroxysmal atrial fibrillation: Secondary | ICD-10-CM | POA: Diagnosis not present

## 2024-02-02 DIAGNOSIS — F339 Major depressive disorder, recurrent, unspecified: Secondary | ICD-10-CM | POA: Diagnosis not present

## 2024-02-02 DIAGNOSIS — I10 Essential (primary) hypertension: Secondary | ICD-10-CM | POA: Diagnosis not present

## 2024-02-02 DIAGNOSIS — N179 Acute kidney failure, unspecified: Secondary | ICD-10-CM | POA: Diagnosis not present

## 2024-02-02 DIAGNOSIS — J439 Emphysema, unspecified: Secondary | ICD-10-CM | POA: Diagnosis present

## 2024-02-02 DIAGNOSIS — F0283 Dementia in other diseases classified elsewhere, unspecified severity, with mood disturbance: Secondary | ICD-10-CM | POA: Diagnosis present

## 2024-02-02 DIAGNOSIS — J9601 Acute respiratory failure with hypoxia: Secondary | ICD-10-CM | POA: Diagnosis not present

## 2024-02-02 DIAGNOSIS — I129 Hypertensive chronic kidney disease with stage 1 through stage 4 chronic kidney disease, or unspecified chronic kidney disease: Secondary | ICD-10-CM | POA: Diagnosis not present

## 2024-02-02 DIAGNOSIS — J189 Pneumonia, unspecified organism: Secondary | ICD-10-CM

## 2024-02-02 DIAGNOSIS — J9621 Acute and chronic respiratory failure with hypoxia: Secondary | ICD-10-CM | POA: Diagnosis not present

## 2024-02-02 DIAGNOSIS — R0902 Hypoxemia: Secondary | ICD-10-CM | POA: Diagnosis not present

## 2024-02-02 DIAGNOSIS — R0602 Shortness of breath: Secondary | ICD-10-CM | POA: Diagnosis not present

## 2024-02-02 DIAGNOSIS — Z8673 Personal history of transient ischemic attack (TIA), and cerebral infarction without residual deficits: Secondary | ICD-10-CM | POA: Diagnosis not present

## 2024-02-02 DIAGNOSIS — Z7901 Long term (current) use of anticoagulants: Secondary | ICD-10-CM | POA: Diagnosis not present

## 2024-02-02 DIAGNOSIS — R1314 Dysphagia, pharyngoesophageal phase: Secondary | ICD-10-CM | POA: Diagnosis not present

## 2024-02-02 DIAGNOSIS — Z8249 Family history of ischemic heart disease and other diseases of the circulatory system: Secondary | ICD-10-CM | POA: Diagnosis not present

## 2024-02-02 DIAGNOSIS — J8410 Pulmonary fibrosis, unspecified: Secondary | ICD-10-CM | POA: Diagnosis not present

## 2024-02-02 DIAGNOSIS — R052 Subacute cough: Secondary | ICD-10-CM | POA: Diagnosis not present

## 2024-02-02 DIAGNOSIS — Z981 Arthrodesis status: Secondary | ICD-10-CM

## 2024-02-02 DIAGNOSIS — K219 Gastro-esophageal reflux disease without esophagitis: Secondary | ICD-10-CM | POA: Diagnosis present

## 2024-02-02 DIAGNOSIS — F39 Unspecified mood [affective] disorder: Secondary | ICD-10-CM | POA: Diagnosis not present

## 2024-02-02 DIAGNOSIS — E785 Hyperlipidemia, unspecified: Secondary | ICD-10-CM | POA: Diagnosis present

## 2024-02-02 DIAGNOSIS — R41841 Cognitive communication deficit: Secondary | ICD-10-CM | POA: Diagnosis not present

## 2024-02-02 DIAGNOSIS — Z974 Presence of external hearing-aid: Secondary | ICD-10-CM

## 2024-02-02 DIAGNOSIS — Z9841 Cataract extraction status, right eye: Secondary | ICD-10-CM

## 2024-02-02 DIAGNOSIS — Z9842 Cataract extraction status, left eye: Secondary | ICD-10-CM

## 2024-02-02 DIAGNOSIS — R5383 Other fatigue: Secondary | ICD-10-CM | POA: Diagnosis not present

## 2024-02-02 LAB — CBC WITH DIFFERENTIAL/PLATELET
Abs Immature Granulocytes: 0.04 10*3/uL (ref 0.00–0.07)
Basophils Absolute: 0 10*3/uL (ref 0.0–0.1)
Basophils Relative: 0 %
Eosinophils Absolute: 0.2 10*3/uL (ref 0.0–0.5)
Eosinophils Relative: 3 %
HCT: 51.1 % (ref 39.0–52.0)
Hemoglobin: 16.2 g/dL (ref 13.0–17.0)
Immature Granulocytes: 1 %
Lymphocytes Relative: 27 %
Lymphs Abs: 2.2 10*3/uL (ref 0.7–4.0)
MCH: 29.3 pg (ref 26.0–34.0)
MCHC: 31.7 g/dL (ref 30.0–36.0)
MCV: 92.6 fL (ref 80.0–100.0)
Monocytes Absolute: 0.6 10*3/uL (ref 0.1–1.0)
Monocytes Relative: 7 %
Neutro Abs: 4.9 10*3/uL (ref 1.7–7.7)
Neutrophils Relative %: 62 %
Platelets: 418 10*3/uL — ABNORMAL HIGH (ref 150–400)
RBC: 5.52 MIL/uL (ref 4.22–5.81)
RDW: 13.5 % (ref 11.5–15.5)
WBC: 7.9 10*3/uL (ref 4.0–10.5)
nRBC: 0 % (ref 0.0–0.2)

## 2024-02-02 LAB — COMPREHENSIVE METABOLIC PANEL WITH GFR
ALT: 25 U/L (ref 0–44)
AST: 27 U/L (ref 15–41)
Albumin: 3.7 g/dL (ref 3.5–5.0)
Alkaline Phosphatase: 99 U/L (ref 38–126)
Anion gap: 13 (ref 5–15)
BUN: 27 mg/dL — ABNORMAL HIGH (ref 8–23)
CO2: 22 mmol/L (ref 22–32)
Calcium: 9.5 mg/dL (ref 8.9–10.3)
Chloride: 101 mmol/L (ref 98–111)
Creatinine, Ser: 1.44 mg/dL — ABNORMAL HIGH (ref 0.61–1.24)
GFR, Estimated: 47 mL/min — ABNORMAL LOW (ref >60)
Glucose, Bld: 97 mg/dL (ref 70–99)
Potassium: 4.3 mmol/L (ref 3.5–5.1)
Sodium: 136 mmol/L (ref 135–145)
Total Bilirubin: 0.8 mg/dL (ref 0.0–1.2)
Total Protein: 8 g/dL (ref 6.5–8.1)

## 2024-02-02 LAB — POC COVID19 BINAXNOW: SARS Coronavirus 2 Ag: NEGATIVE

## 2024-02-02 LAB — BLOOD GAS, ARTERIAL
Acid-base deficit: 0.3 mmol/L (ref 0.0–2.0)
Bicarbonate: 24.1 mmol/L (ref 20.0–28.0)
Drawn by: 37588
FIO2: 28 %
O2 Saturation: 98.5 %
Patient temperature: 36.9
pCO2 arterial: 38 mmHg (ref 32–48)
pH, Arterial: 7.41 (ref 7.35–7.45)
pO2, Arterial: 79 mmHg — ABNORMAL LOW (ref 83–108)

## 2024-02-02 LAB — MRSA NEXT GEN BY PCR, NASAL: MRSA by PCR Next Gen: NOT DETECTED

## 2024-02-02 LAB — I-STAT CG4 LACTIC ACID, ED: Lactic Acid, Venous: 1.6 mmol/L (ref 0.5–1.9)

## 2024-02-02 LAB — TROPONIN I (HIGH SENSITIVITY): Troponin I (High Sensitivity): 12 ng/L (ref <18)

## 2024-02-02 MED ORDER — PREDNISONE 10 MG PO TABS: 40.0000 mg | ORAL_TABLET | Freq: Every day | ORAL | 0 refills | Status: DC

## 2024-02-02 MED ORDER — IPRATROPIUM-ALBUTEROL 0.5-2.5 (3) MG/3ML IN SOLN
3.0000 mL | Freq: Four times a day (QID) | RESPIRATORY_TRACT | Status: DC
  Administered 2024-02-02 – 2024-02-03 (×2): 3 mL via RESPIRATORY_TRACT
  Filled 2024-02-02 (×2): qty 3

## 2024-02-02 MED ORDER — CEFDINIR 300 MG PO CAPS: 300.0000 mg | ORAL_CAPSULE | Freq: Two times a day (BID) | ORAL | 0 refills | Status: DC

## 2024-02-02 MED ORDER — ALBUTEROL SULFATE HFA 108 (90 BASE) MCG/ACT IN AERS: 2.0000 | INHALATION_SPRAY | Freq: Four times a day (QID) | RESPIRATORY_TRACT | 2 refills | Status: AC | PRN

## 2024-02-02 MED ORDER — SODIUM CHLORIDE 0.9% FLUSH
3.0000 mL | Freq: Two times a day (BID) | INTRAVENOUS | Status: DC
  Administered 2024-02-02 – 2024-02-04 (×4): 3 mL via INTRAVENOUS

## 2024-02-02 MED ORDER — METOPROLOL TARTRATE 50 MG PO TABS
50.0000 mg | ORAL_TABLET | Freq: Two times a day (BID) | ORAL | Status: DC
  Administered 2024-02-02 – 2024-02-04 (×4): 50 mg via ORAL
  Filled 2024-02-02 (×4): qty 1

## 2024-02-02 MED ORDER — DABIGATRAN ETEXILATE MESYLATE 150 MG PO CAPS
150.0000 mg | ORAL_CAPSULE | Freq: Two times a day (BID) | ORAL | Status: DC
  Administered 2024-02-02 – 2024-02-04 (×4): 150 mg via ORAL
  Filled 2024-02-02 (×6): qty 1

## 2024-02-02 MED ORDER — VANCOMYCIN HCL 750 MG/150ML IV SOLN: 750.0000 mg | INTRAVENOUS | Status: DC

## 2024-02-02 MED ORDER — MEMANTINE HCL 10 MG PO TABS
5.0000 mg | ORAL_TABLET | Freq: Two times a day (BID) | ORAL | Status: DC
  Administered 2024-02-02 – 2024-02-04 (×4): 5 mg via ORAL
  Filled 2024-02-02 (×4): qty 1

## 2024-02-02 MED ORDER — EZETIMIBE 10 MG PO TABS
10.0000 mg | ORAL_TABLET | Freq: Every evening | ORAL | Status: DC
  Administered 2024-02-02 – 2024-02-03 (×2): 10 mg via ORAL
  Filled 2024-02-02 (×2): qty 1

## 2024-02-02 MED ORDER — ACETAMINOPHEN 500 MG PO TABS: 1000.0000 mg | ORAL_TABLET | Freq: Four times a day (QID) | ORAL | Status: DC | PRN

## 2024-02-02 MED ORDER — PANTOPRAZOLE SODIUM 40 MG PO TBEC
40.0000 mg | DELAYED_RELEASE_TABLET | Freq: Every day | ORAL | Status: DC
  Administered 2024-02-03 – 2024-02-04 (×2): 40 mg via ORAL
  Filled 2024-02-02 (×2): qty 1

## 2024-02-02 MED ORDER — SODIUM CHLORIDE 0.9 % IV BOLUS
500.0000 mL | Freq: Once | INTRAVENOUS | Status: AC
  Administered 2024-02-02: 500 mL via INTRAVENOUS

## 2024-02-02 MED ORDER — ALBUTEROL SULFATE (2.5 MG/3ML) 0.083% IN NEBU: 2.5000 mg | INHALATION_SOLUTION | RESPIRATORY_TRACT | Status: DC | PRN

## 2024-02-02 MED ORDER — METHYLPREDNISOLONE SODIUM SUCC 125 MG IJ SOLR
60.0000 mg | Freq: Two times a day (BID) | INTRAMUSCULAR | Status: DC
  Administered 2024-02-02 – 2024-02-04 (×4): 60 mg via INTRAVENOUS
  Filled 2024-02-02 (×4): qty 2

## 2024-02-02 MED ORDER — MEMANTINE HCL 5 MG PO TABS: 10.0000 mg | ORAL_TABLET | Freq: Two times a day (BID) | ORAL | Status: DC

## 2024-02-02 MED ORDER — ESCITALOPRAM OXALATE 10 MG PO TABS
10.0000 mg | ORAL_TABLET | Freq: Every day | ORAL | Status: DC
  Administered 2024-02-02 – 2024-02-03 (×2): 10 mg via ORAL
  Filled 2024-02-02 (×2): qty 1

## 2024-02-02 MED ORDER — POLYETHYLENE GLYCOL 3350 17 G PO PACK: 17.0000 g | PACK | Freq: Every day | ORAL | Status: DC | PRN

## 2024-02-02 MED ORDER — VANCOMYCIN HCL IN DEXTROSE 1-5 GM/200ML-% IV SOLN
1000.0000 mg | Freq: Once | INTRAVENOUS | Status: AC
  Administered 2024-02-02: 1000 mg via INTRAVENOUS
  Filled 2024-02-02: qty 200

## 2024-02-02 MED ORDER — SODIUM CHLORIDE 0.9 % IV SOLN
2.0000 g | Freq: Two times a day (BID) | INTRAVENOUS | Status: DC
  Administered 2024-02-02 – 2024-02-03 (×2): 2 g via INTRAVENOUS
  Filled 2024-02-02 (×2): qty 12.5

## 2024-02-02 MED ORDER — MELATONIN 3 MG PO TABS: 6.0000 mg | ORAL_TABLET | Freq: Every evening | ORAL | Status: DC | PRN

## 2024-02-02 MED ORDER — MIRTAZAPINE 15 MG PO TABS
7.5000 mg | ORAL_TABLET | Freq: Every day | ORAL | Status: DC
  Administered 2024-02-02 – 2024-02-03 (×2): 7.5 mg via ORAL
  Filled 2024-02-02 (×2): qty 1

## 2024-02-02 NOTE — H&P (Signed)
 History and Physical    Jason Farmer:[id number] DOB: 11-30-1934 DOA: 02/02/2024  PCP: Wayne Haines, MD   Patient coming from: PCP office; lives at [facility]    Chief Complaint:  Chief Complaint  Patient presents with   Shortness of Breath    HPI:  Jason Farmer is a 89 y.o. 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 Family and patient notes that he has had chronic cough and even scant hemoptysis going back years. However he was not bothered by exertional symptoms or SOB until recently. Over the past 2-3 weeks has developed more dyspnea on exertion, significant fatigue during the day. He has a worsened cough which is productive, but has not looked at his sputum much, occasional green sputum at night. No other URI symptoms. No fevers, chills, no sick contacts. No chest pain. Does have ongoing dysphagia to solids > liquids and overall decreased PO intake since he has been ill over past few weeks.   He was seen in the ED on 6/3 and had CTA chest which demonstrated significant increase in peripheral fibrotic changes, worst in the LUL and LLL, as well as GGO in the LLL > LUL. He was discharged on a course of Doxcycline which he has completed but still having persistent symptoms. He saw his PCP today and had nml sat on RA but desat to 86% with activity and sent to the ED.    Review of Systems:  ROS complete and negative except as marked above   Allergies  Allergen Reactions   Statins Other (See Comments)    Muscle cramps    Aspirin  Other (See Comments)    bleeding   Atorvastatin     Other Reaction(s): MYALGIA    Prior to Admission medications   Medication Sig Start Date End Date Taking? Authorizing  Provider  acetaminophen  (TYLENOL ) 325 MG tablet Take 325 mg by mouth every 6 (six) hours as needed for moderate pain or headache.    [provider]  albuterol  (VENTOLIN  HFA) 108 (90 Base) MCG/ACT inhaler Inhale 2 puffs into the lungs every 6 (six) hours as needed for wheezing or shortness of breath. 02/02/24   Amin, Saad, MD  amiodarone  (PACERONE ) 200 MG tablet Take 1 tablet (200 mg total) by mouth daily. 10/12/23   Fenton, Clint R, PA  cefdinir (OMNICEF) 300 MG capsule Take 1 capsule (300 mg total) by mouth 2 (two) times daily. 02/02/24   Amin, Saad, MD  dabigatran  (PRADAXA ) 150 MG CAPS capsule Take 1 capsule (150 mg total) by mouth 2 (two) times daily. 10/12/23   Fenton, Clint R, PA  doxycycline  (VIBRAMYCIN ) 100 MG capsule Take 1 capsule (100 mg total) by mouth 2 (two) times daily. 01/26/24   Darlis Eisenmenger, PA-C  escitalopram  (LEXAPRO ) 10 MG tablet Take 1 tablet (10 mg total) by mouth daily. 07/14/23   Wayne Haines, MD  ezetimibe  (ZETIA ) 10 MG tablet TAKE ONE TABLET BY MOUTH IN THE EVENING 01/19/24   Amin, Saad, MD  fexofenadine  (ALLEGRA ) 180 MG tablet Take 1 tablet (180 mg total) by mouth daily. 12/17/23   Wayne Haines, MD  fluticasone  (FLONASE ) 50 MCG/ACT nasal spray Place 1 spray into both nostrils in the morning and at  bedtime. Patient taking differently: Place 1 spray into both nostrils daily as needed for allergies. 08/13/23 08/12/24  Wayne Haines, MD  latanoprost  (XALATAN ) 0.005 % ophthalmic solution Place 1 drop into both eyes at bedtime.     [provider]  meclizine  (ANTIVERT ) 12.5 MG tablet Take 1 tablet (12.5 mg total) by mouth 3 (three) times daily as needed for dizziness. 08/13/23   Wayne Haines, MD  memantine  (NAMENDA ) 10 MG tablet Take 1 tablet (10 mg total) by mouth 2 (two) times daily. 10/07/23   Amin, Saad, MD  memantine  (NAMENDA ) 5 MG tablet Take 5 mg by mouth 2 (two) times daily. 01/20/24   [provider]  metoprolol  tartrate (LOPRESSOR ) 50 MG  tablet Take 1 tablet (50 mg total) by mouth 2 (two) times daily. 11/13/23   Amin, Saad, MD  mirtazapine  (REMERON ) 15 MG tablet Take 1 tablet (15 mg total) by mouth every evening. 01/19/24   Tita Form, MD  omeprazole  (PRILOSEC) 20 MG capsule Take 1 capsule (20 mg total) by mouth every morning. 01/19/24   Amin, Saad, MD  polyethylene glycol (MIRALAX  / GLYCOLAX ) 17 g packet Take 17 g by mouth daily. 10/06/23   Amin, Saad, MD  predniSONE  (DELTASONE ) 10 MG tablet Take 4 tablets (40 mg total) by mouth daily with breakfast for 5 days. 02/02/24 02/07/24  Amin, Saad, MD  QUEtiapine  (SEROQUEL ) 25 MG tablet Take 1 tablet (25 mg total) by mouth at bedtime. 12/14/23   Wayne Haines, MD  sodium chloride  (OCEAN) 0.65 % nasal spray Place 1 spray into the nose as needed for congestion. 10/06/23   Tita Form, MD    Past Medical History:  Diagnosis Date   Allergy    Anxiety    "not a problem for a long time" (11/27/2016)   Arthritis    "wrists, knees, shoulders, back" (11/27/2016)   Chronic lower back pain    "5th disc is protruding" (11/27/2016)   Essential tremor    GERD (gastroesophageal reflux disease)    Glaucoma    Heart murmur    dx'd in [employer] in the 1960s; haven't been seen for it since "(11/27/2016)   Hepatitis ~ 1958   "jaundice kind"   HOH (hard of hearing)    Hyperlipemia    Migraine    "none since my neck OR" (11/27/2016)   Seasonal allergies    TIA (transient ischemic attack) 04/2015   Wears glasses    Wears hearing aid     Past Surgical History:  Procedure Laterality Date   ANTERIOR CERVICAL DECOMP/DISCECTOMY FUSION  2007   APPENDECTOMY     BACK SURGERY     CARPAL TUNNEL RELEASE Right 10/12/2013   Procedure: RIGHT CARPAL TUNNEL RELEASE;  Surgeon: Kemp Patter, MD;  Location: [facility];  Service: Orthopedics;  Laterality: Right;   CATARACT EXTRACTION W/ INTRAOCULAR LENS  IMPLANT, BILATERAL Bilateral    COLONOSCOPY     HEMORRHOID SURGERY     KNEE ARTHROSCOPY Left 1970s   NASAL  SEPTOPLASTY W/ TURBINOPLASTY Bilateral 02/15/2013   Procedure: NASAL SEPTOPLASTY WITH BILATER TURBINATE REDUCTION;  Surgeon: Lawence Press, MD;  Location: [facility];  Service: ENT;  Laterality: Bilateral;   TONSILLECTOMY     TRIGGER FINGER RELEASE  06/08/2012   Procedure: RELEASE TRIGGER FINGER/A-1 PULLEY;  Surgeon: Kemp Patter, MD;  Location: [facility];  Service: Orthopedics;  Laterality: Left;  Release A-1 Pulley Left Long Finger   TRIGGER FINGER RELEASE  07/14/2012   Procedure: RELEASE TRIGGER FINGER/A-1 PULLEY;  Surgeon: Kemp Patter, MD;  Location: [facility];  Service: Orthopedics;  Laterality: Right;   TRIGGER FINGER RELEASE Right 10/12/2013   Procedure: RELEASE TRIGGER FINGER/A-1 PULLEY RIGHT MIDDLE FINGER;  Surgeon: Kemp Patter, MD;  Location: [facility];  Service: Orthopedics;  Laterality: Right;   UPPER GASTROINTESTINAL ENDOSCOPY       reports that he quit smoking about 39 years ago. His smoking use included cigarettes. He started smoking about 69 years ago. He has a 60 pack-year smoking history. He has never used smokeless tobacco. He reports that he does not drink alcohol and does not use drugs.  Family History  Problem Relation Age of Onset   Glaucoma Mother    Dementia Father    Heart failure Father    Colon cancer Neg Hx    Rectal cancer Neg Hx    Stomach cancer Neg Hx    Esophageal cancer Neg Hx    Colon polyps Neg Hx      Physical Exam: Vitals:   02/02/24 1830 02/02/24 1845 02/02/24 1901 02/02/24 1915  BP:   (!) 171/77   Pulse: (!) 59 (!) 56 62 (!) 57  Resp: 17 19 12    Temp:      TempSrc:      SpO2: 97% 98% 93% 94%    Gen: Awake, alert, elderly, frail appearing.   CV: Regular, normal S1, S2, 2/6 SEM  Resp: Slight increased WOB, tachypneic, on 2L O2, there are diffuse fine rales, worst in the lower fields.  Abd: Flat, normoactive, nontender MSK: Symmetric, no edema  Skin: No rashes or lesions to  exposed skin  Neuro: Alert and interactive  Psych: euthymic, appropriate    Data review:   Labs reviewed, notable for:   ABG 7.41 / 38 / 79, on 2L o2  WBC 7.9   Micro:  Results for orders placed or performed during the hospital encounter of 01/26/24  Resp panel by RT-PCR (RSV, Flu A&B, Covid) Anterior Nasal Swab     Status: None   Collection Time: 01/26/24  1:09 PM   Specimen: Anterior Nasal Swab  Result Value Ref Range Status   SARS Coronavirus 2 by RT PCR NEGATIVE NEGATIVE Final    Comment: (NOTE) SARS-CoV-2 target nucleic acids are NOT DETECTED.  The SARS-CoV-2 RNA is generally detectable in upper respiratory specimens during the acute phase of infection. The lowest concentration of SARS-CoV-2 viral copies this assay can detect is 138 copies/mL. A negative result does not preclude SARS-Cov-2 infection and should not be used as the sole basis for treatment or other patient management decisions. A negative result may occur with  improper specimen collection/handling, submission of specimen other than nasopharyngeal swab, presence of viral mutation(s) within the areas targeted by this assay, and inadequate number of viral copies(<138 copies/mL). A negative result must be combined with clinical observations, patient history, and epidemiological information. The expected result is Negative.  Fact Sheet for Patients:  BloggerCourse.com  Fact Sheet for Healthcare Providers:  SeriousBroker.it  This test is no t yet approved or cleared by the [place]  FDA and  has been authorized for detection and/or diagnosis of SARS-CoV-2 by FDA under an Emergency Use Authorization (EUA). This EUA will remain  in effect (meaning this test can be used) for the duration of the COVID-19 declaration under Section 564(b)(1) of the Act, 21 U.S.C.section 360bbb-3(b)(1), unless the authorization is terminated  or revoked sooner.  Influenza A by PCR NEGATIVE NEGATIVE Final   Influenza B by PCR NEGATIVE NEGATIVE Final    Comment: (NOTE) The Xpert Xpress SARS-CoV-2/FLU/RSV plus assay is intended as an aid in the diagnosis of influenza from Nasopharyngeal swab specimens and should not be used as a sole basis for treatment. Nasal washings and aspirates are unacceptable for Xpert Xpress SARS-CoV-2/FLU/RSV testing.  Fact Sheet for Patients: BloggerCourse.com  Fact Sheet for Healthcare Providers: SeriousBroker.it  This test is not yet approved or cleared by the [place]  FDA and has been authorized for detection and/or diagnosis of SARS-CoV-2 by FDA under an Emergency Use Authorization (EUA). This EUA will remain in effect (meaning this test can be used) for the duration of the COVID-19 declaration under Section 564(b)(1) of the Act, 21 U.S.C. section 360bbb-3(b)(1), unless the authorization is terminated or revoked.     Resp Syncytial Virus by PCR NEGATIVE NEGATIVE Final    Comment: (NOTE) Fact Sheet for Patients: BloggerCourse.com  Fact Sheet for Healthcare Providers: SeriousBroker.it  This test is not yet approved or cleared by the [place]  FDA and has been authorized for detection and/or diagnosis of SARS-CoV-2 by FDA under an Emergency Use Authorization (EUA). This EUA will remain in effect (meaning this test can be used) for the duration of the COVID-19 declaration under Section 564(b)(1) of the Act, 21 U.S.C. section 360bbb-3(b)(1), unless the authorization is terminated or revoked.  Performed at [facility], [address], [place], [place] [address]     Imaging reviewed:  DG Chest 2 View Result Date: 02/02/2024 CLINICAL DATA:  Cough, shortness of breath EXAM: CHEST - 2 VIEW COMPARISON:  01/26/2024 FINDINGS: Severe fibrosis noted throughout the lungs, left greater than  right. This is not significantly changed since prior study. No definite acute process. Heart and mediastinal contours within normal limits. No effusions. IMPRESSION: Severe fibrotic changes.  No definite acute process. Electronically Signed   By: Janeece Mechanic M.D.   On: 02/02/2024 17:41   Reviewed CTA chest 6/3    Reviewed CXR from today      EKG:  Personally reviewed SR, RBBB, LAFB, no acute ischemic changes.    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 Suspect ILD flare, with progressive ILD changes on recent imaging.   Acute hypoxic respiratory failure, on 2L  Suspect ILD flare, progressive ILD changes on recent imaging  ? CAP with structural lung disease ? Amiodarone  lung toxicity  Hx of emphysema and ILD  On review of prior imaging, has ILD changes as far back as '18 which is earliest available CT chest. He has had chronic cough and scant hemoptysis x years. Re: risk for lung disease is Former smoker quit in 30s, former Engineer, site, hx esophageal stricture at risk of aspiration, and has been on Amiodarone  for Afib since 2/'25. Now with recent progressive DOE and worsened cough x 2-3 weeks. CTA Chest 6/3 with significant increase in peripheral fibrotic changes, worst in the LUL and LLL, as well as GGO in the LLL > LUL. He was discharged on a course of Doxcycline which he has completed but still  having persistent symptoms. He saw his PCP today and had nml sat on RA but desat to 86% with activity and sent to the ED. Currently on 2L, ABG with pa02 79. WBC 7. COVID neg at PCP office. CXR in ED shows  asymmetric interstitial changes in L > R similar to prior CTA. Likely ILD flare, with question of recent CAP, ? Amiodarone  toxicity with recent initiation.  - Pulmonology consult in a.m., messaged PCCM to add to list for AM  - Start on vancomycin pharmacy to dose, cefepime 2 g IV every 12 hours (renally dosed) to cover for CAP with structural lung disease and recent antibiotic exposure. - Start methylprednisolone  60 mg IV every 12 hours for ILD - Hold his home amiodarone  for now, appreciate pulmonology thoughts on potential toxicity - Check RVP, sputum culture, ESR, CRP, ANA.  - DuoNeb every 6 hours, albuterol  every 4 hours as needed, I-S, flutter valve - Discussed that I suspect he will need oxygen at time of discharge, will need home O2 eval   Chronic medical problems: Afib: Hx of DCCV and started on Amiodarone  since 2/'25.  Holding amiodarone  in the setting of worsening ILD.  Continue home metoprolol  50 mg twice daily, Pradaxa  150 mg twice daily CAD: on AC per above, continue home Zetia .  Statin intolerant.  Suspected lewy body dementia: Continue home memantine , Seroquel  nightly Esophageal stricture last dilation in 7/'24: reports ongoing dysphagia, once respiratory issues stabilized would involve GI for possible endoscopy and dilation. For now dysphagia 3 diet, aspiration precautions.  CKD 3a: Baseline Cr near 1.3 - 1.4 HTN: On bblocker per above  Mood d/o: Continue home Escitalopram , Mirtazapine .    There is no height or weight on file to calculate BMI.    DVT prophylaxis:  Pradaxa   Code Status:  Full Code Diet:  Diet Orders (From admission, onward)     Start     Ordered   02/02/24 1949  Diet regular Room service appropriate? Yes; Fluid consistency: Thin  Diet effective now       Question Answer Comment  Room service appropriate? Yes   Fluid consistency: Thin      02/02/24 1950           Family Communication:  Yes discussed with grandaughter Sarah   Consults:  Pulmonology    Admission status:   Inpatient, Telemetry bed  Severity of Illness: The appropriate patient status for this patient is INPATIENT. Inpatient status is judged to be reasonable and necessary in order to provide the required intensity of service to ensure the patient's safety. The patient's presenting symptoms, physical exam findings, and initial radiographic and laboratory data in the context of their chronic comorbidities is felt to place them at high risk for further clinical deterioration. Furthermore, it is not anticipated that the patient will be medically stable for discharge from the hospital within 2 midnights of admission.   * I certify that at the point of admission it is my clinical judgment that the patient will require inpatient hospital care spanning beyond 2 midnights from the point of admission due to high intensity of service, high risk for further deterioration and high frequency of surveillance required.*   Jason Larch, MD [facility]  How to contact the [facility] Attending or Consulting provider 7A - 7P or covering provider during after hours 7P -7A, for this patient.  Check the care team in [facility] and look for a) attending/consulting [facility] provider listed and b) the [facility] team listed Log into [email or web address]  and use [facility]'s universal password to access. If you do not have the password, please contact the hospital operator. Locate the [facility] provider you are looking for under [facility] and page to a number that you can be directly reached. If you still have difficulty reaching the provider, please page the [facility] (Director on Call) for the Hospitalists listed on amion for assistance.  02/02/2024, 8:19 PM

## 2024-02-02 NOTE — ED Provider Notes (Signed)
 [place] EMERGENCY DEPARTMENT AT [facility] Provider Note   CSN: [id number] Arrival date & time: 02/02/24  1553     History  Chief Complaint  Patient presents with   Shortness of Breath    Jason Farmer is a 89 y.o. male.  HPI Patient developed shortness of breath and cough about 10 days ago.  Symptoms started [place].  It got worse for a few days and then he was seen at the emergency department.  Patient had CT scan and PE study done.  At that time probable pneumonia was identified and patient took a course of doxycycline .  He completed doxycycline .  He continued however to have shortness of breath and still with general weakness and cough.  He is getting quite short of breath with minimal exertion.  He followed up with PCP today and was identified to get hypoxia with exertion.  Oxygen saturation went down to 86% with minor activity.  Patient is not oxygen dependent does not have prior history of COPD or pulmonary disease.  He does have prior history of atrial fibrillation but has been well-controlled.  Patient takes amiodarone  200 mg daily.    Home Medications Prior to Admission medications   Medication Sig Start Date End Date Taking? Authorizing Provider  acetaminophen  (TYLENOL ) 325 MG tablet Take 325 mg by mouth every 6 (six) hours as needed for moderate pain or headache.    [provider]  albuterol  (VENTOLIN  HFA) 108 (90 Base) MCG/ACT inhaler Inhale 2 puffs into the lungs every 6 (six) hours as needed for wheezing or shortness of breath. 02/02/24   Amin, Saad, MD  amiodarone  (PACERONE ) 200 MG tablet Take 1 tablet (200 mg total) by mouth daily. 10/12/23   Fenton, Clint R, PA  cefdinir (OMNICEF) 300 MG capsule Take 1 capsule (300 mg total) by mouth 2 (two) times daily. 02/02/24   Amin, Saad, MD  dabigatran  (PRADAXA ) 150 MG CAPS capsule Take 1 capsule (150 mg total) by mouth 2 (two) times daily. 10/12/23   Fenton, Clint  R, PA  doxycycline  (VIBRAMYCIN ) 100 MG capsule Take 1 capsule (100 mg total) by mouth 2 (two) times daily. 01/26/24   Darlis Eisenmenger, PA-C  escitalopram  (LEXAPRO ) 10 MG tablet Take 1 tablet (10 mg total) by mouth daily. 07/14/23   Wayne Haines, MD  ezetimibe  (ZETIA ) 10 MG tablet TAKE ONE TABLET BY MOUTH IN THE EVENING 01/19/24   Amin, Saad, MD  fexofenadine  (ALLEGRA ) 180 MG tablet Take 1 tablet (180 mg total) by mouth daily. 12/17/23   Wayne Haines, MD  fluticasone  (FLONASE ) 50 MCG/ACT nasal spray Place 1 spray into both nostrils in the morning and at bedtime. Patient taking differently: Place 1 spray into both nostrils daily as needed for allergies. 08/13/23 08/12/24  Wayne Haines, MD  latanoprost  (XALATAN ) 0.005 % ophthalmic solution Place 1 drop into both eyes at bedtime.     [provider]  meclizine  (ANTIVERT ) 12.5 MG tablet Take 1 tablet (12.5 mg total) by mouth 3 (three) times daily as needed for dizziness. 08/13/23   Wayne Haines, MD  memantine  (NAMENDA ) 10 MG tablet Take 1 tablet (10 mg total) by mouth 2 (two) times daily. 10/07/23   Amin, Saad, MD  memantine  (NAMENDA ) 5 MG tablet Take 5 mg by mouth 2 (two) times daily. 01/20/24   [provider]  metoprolol  tartrate (LOPRESSOR ) 50 MG tablet Take 1 tablet (50 mg total) by  mouth 2 (two) times daily. 11/13/23   Amin, Saad, MD  mirtazapine  (REMERON ) 15 MG tablet Take 1 tablet (15 mg total) by mouth every evening. 01/19/24   Tita Form, MD  omeprazole  (PRILOSEC) 20 MG capsule Take 1 capsule (20 mg total) by mouth every morning. 01/19/24   Amin, Saad, MD  polyethylene glycol (MIRALAX  / GLYCOLAX ) 17 g packet Take 17 g by mouth daily. 10/06/23   Amin, Saad, MD  predniSONE  (DELTASONE ) 10 MG tablet Take 4 tablets (40 mg total) by mouth daily with breakfast for 5 days. 02/02/24 02/07/24  Amin, Saad, MD  QUEtiapine  (SEROQUEL ) 25 MG tablet Take 1 tablet (25 mg total) by mouth at bedtime. 12/14/23   Wayne Haines, MD  sodium chloride   (OCEAN) 0.65 % nasal spray Place 1 spray into the nose as needed for congestion. 10/06/23   Amin, Saad, MD      Allergies    Statins and Aspirin     Review of Systems   Review of Systems  Physical Exam Updated Vital Signs BP (!) 171/77   Pulse (!) 57   Temp 98.5 F (36.9 C) (Oral)   Resp 12   SpO2 94%  Physical Exam Constitutional:      Comments: Patient is alert.  Clear mental status.  No respiratory distress at rest.  Answering questions.  Slightly frail in appearance.  HENT:     Mouth/Throat:     Pharynx: Oropharynx is clear.  Eyes:     Extraocular Movements: Extraocular movements intact.  Cardiovascular:     Rate and Rhythm: Normal rate and regular rhythm.  Pulmonary:     Comments: No respiratory distress at rest.  Fine crackles throughout lung fields from mid lung fields to bases. Abdominal:     General: There is no distension.     Palpations: Abdomen is soft.     Tenderness: There is no abdominal tenderness. There is no guarding.  Musculoskeletal:        General: No swelling or tenderness. Normal range of motion.     Cervical back: Neck supple.     Right lower leg: No edema.     Left lower leg: No edema.  Skin:    General: Skin is warm and dry.  Neurological:     General: No focal deficit present.     Mental Status: He is oriented to person, place, and time.     Motor: No weakness.     Coordination: Coordination normal.  Psychiatric:        Mood and Affect: Mood normal.     ED Results / Procedures / Treatments   Labs (all labs ordered are listed, but only abnormal results are displayed) Labs Reviewed  COMPREHENSIVE METABOLIC PANEL WITH GFR - Abnormal; Notable for the following components:      Result Value   BUN 27 (*)    Creatinine, Ser 1.44 (*)    GFR, Estimated 47 (*)    All other components within normal limits  CBC WITH DIFFERENTIAL/PLATELET - Abnormal; Notable for the following components:   Platelets 418 (*)    All other components within  normal limits  BLOOD GAS, ARTERIAL - Abnormal; Notable for the following components:   pO2, Arterial 79 (*)    All other components within normal limits  CULTURE, BLOOD (ROUTINE X 2)  CULTURE, BLOOD (ROUTINE X 2)  EXPECTORATED SPUTUM ASSESSMENT W GRAM STAIN, RFLX TO RESP C  MRSA NEXT GEN BY PCR, NASAL  RESPIRATORY PANEL BY PCR  URINALYSIS, ROUTINE W REFLEX MICROSCOPIC  BRAIN NATRIURETIC PEPTIDE  BASIC METABOLIC PANEL WITH GFR  CBC  MAGNESIUM  PHOSPHORUS  PROCALCITONIN  BRAIN NATRIURETIC PEPTIDE  SEDIMENTATION RATE  C-REACTIVE PROTEIN  I-STAT CG4 LACTIC ACID, ED  I-STAT CG4 LACTIC ACID, ED  TROPONIN I (HIGH SENSITIVITY)  TROPONIN I (HIGH SENSITIVITY)    EKG EKG Interpretation Date/Time:  Tuesday February 02 2024 16:16:22 EDT Ventricular Rate:  56 PR Interval:  186 QRS Duration:  156 QT Interval:  499 QTC Calculation: 482 R Axis:   -48  Text Interpretation: Sinus rhythm RBBB and LAFB no sig change from previous Confirmed by Wynetta Heckle [phone]) on 02/02/2024 4:25:59 PM  Radiology DG Chest 2 View Result Date: 02/02/2024 CLINICAL DATA:  Cough, shortness of breath EXAM: CHEST - 2 VIEW COMPARISON:  01/26/2024 FINDINGS: Severe fibrosis noted throughout the lungs, left greater than right. This is not significantly changed since prior study. No definite acute process. Heart and mediastinal contours within normal limits. No effusions. IMPRESSION: Severe fibrotic changes.  No definite acute process. Electronically Signed   By: Janeece Mechanic M.D.   On: 02/02/2024 17:41    Procedures Procedures    Medications Ordered in ED Medications  sodium chloride  flush (NS) 0.9 % injection 3 mL (has no administration in time range)  albuterol  (PROVENTIL ) (2.5 MG/3ML) 0.083% nebulizer solution 2.5 mg (has no administration in time range)  ipratropium-albuterol  (DUONEB) 0.5-2.5 (3) MG/3ML nebulizer solution 3 mL (has no administration in time range)  methylPREDNISolone  sodium succinate  (SOLU-MEDROL ) 125 mg/2 mL injection 60 mg (has no administration in time range)  ceFEPIme (MAXIPIME) 2 g in sodium chloride  0.9 % 100 mL IVPB (has no administration in time range)  acetaminophen  (TYLENOL ) tablet 1,000 mg (has no administration in time range)  melatonin tablet 6 mg (has no administration in time range)  polyethylene glycol (MIRALAX  / GLYCOLAX ) packet 17 g (has no administration in time range)  dabigatran  (PRADAXA ) capsule 150 mg (has no administration in time range)  escitalopram  (LEXAPRO ) tablet 10 mg (has no administration in time range)  ezetimibe  (ZETIA ) tablet 10 mg (has no administration in time range)  memantine  (NAMENDA ) tablet 5 mg (has no administration in time range)  metoprolol  tartrate (LOPRESSOR ) tablet 50 mg (has no administration in time range)  mirtazapine  (REMERON ) tablet 7.5 mg (has no administration in time range)  pantoprazole  (PROTONIX ) EC tablet 40 mg (has no administration in time range)    ED Course/ Medical Decision Making/ A&P                                 Medical Decision Making Amount and/or Complexity of Data Reviewed Labs: ordered. Radiology: ordered.  Risk Decision regarding hospitalization.   Patient presents as outlined.  He has had an indolent course of worsening shortness of breath and hypoxia.  He failed outpatient treatment with doxycycline .  Presenting to outpatient office today patient was found to be hypoxic to 86% with ambulation.  Patient was treated by EMS with magnesium and DuoNeb.  He is currently on 2 L nasal cannula oxygen.  Patient denies chest pain.  Will proceed with chest x-ray.  The patient has CT imaging to rule out PE 6\20\25.  At this time I do not think he needs repeat CT imaging.  At that time negative for PE and findings suggestive of pulmonary fibrosis.  Troponin 12.  Lactic 1.6.  Comprehensive metabolic panel normal except BUN 27  creatinine 1.4 GFR 47 normal LFTs.  White count 7.9 H&H 16 and 51 platelets 418  normal differential.  ABG on 2 L nasal cannula pH 7.4 PaO2 79 PaCO2 38  At this time patient is exhibiting persistent hypoxia.  Chest x-ray shows changes consistent with fibrosis.  At this time it appears the patient is getting progressive pulmonary fibrosis.  This is a new diagnosis for him.  Unclear if this has been subacute and now advancing previously undiagnosed.  Patient will require admission at this time for oxygen therapy and further diagnostic testing and planning.  Consult: [facility] hospitalist Dr. Segars for admission         Final Clinical Impression(s) / ED Diagnoses Final diagnoses:  Pulmonary fibrosis (HCC)  Hypoxia    Rx / DC Orders ED Discharge Orders     None         Wynetta Heckle, MD 02/02/24 2001

## 2024-02-02 NOTE — Assessment & Plan Note (Signed)
 He has failed antibiotic treatment that was doxycycline  100 mg twice a day for 7 days.  His saturation drops with walking few steps to 86% and he is not feeling well so I will send him to the emergency room via ambulance.

## 2024-02-02 NOTE — Progress Notes (Addendum)
 Office Visit  Subjective   Patient ID: Jason Farmer   DOB: [birth year]   Age: 89 y.o.   MRN: [id number]   Chief Complaint Chief Complaint  Patient presents with   office visit    Tested positive for pneumonia      History of Present Illness   89 years old male was seen as a follow-up from an emergency room visit where he went on June 3rd  [facility].  He has CT scan that shows changes consistent with pulmonary fibrosis and also ground-glass haziness on left-sided sided upper and lower lobe.  He was discharged home on doxycycline  that he has completed 100 mg twice a day.  He says that he has not noticed any difference.  He is feeling weak, he has no appetite and he feels short of breath.  No fever or chills.  No wheezing.  His saturation who on sitting was 90% and with slight walking he dropped to 86%.  He is not feeling well.  He is 89 years old who lives alone and I have spoken with the emergency room doctor at [facility] who suggested that a she is send him to [facility] Emergency Room.  He tells me that he has not been eating and drinking much and feeling weak.   He has a history of atrial fibrillation for that he is on amiodarone  200 mg daily.   He also has working diagnosis of Lewy body dementia.  Past Medical History Past Medical History:  Diagnosis Date   Allergy    Anxiety    "not a problem for a long time" (11/27/2016)   Arthritis    "wrists, knees, shoulders, back" (11/27/2016)   Chronic lower back pain    "5th disc is protruding" (11/27/2016)   Essential tremor    GERD (gastroesophageal reflux disease)    Glaucoma    Heart murmur    dx'd in [employer] in the 1960s; haven't been seen for it since "(11/27/2016)   Hepatitis ~ 1958   "jaundice kind"   HOH (hard of hearing)    Hyperlipemia    Migraine    "none since my neck OR" (11/27/2016)   Seasonal allergies    TIA (transient ischemic attack) 04/2015   Wears glasses    Wears  hearing aid      Allergies Allergies  Allergen Reactions   Statins Other (See Comments)    Muscle cramps    Aspirin  Other (See Comments)    bleeding     Review of Systems Review of Systems  Constitutional:  Positive for malaise/fatigue.  Respiratory:  Positive for cough, hemoptysis, sputum production and shortness of breath.   Cardiovascular: Negative.   Gastrointestinal: Negative.   Neurological:  Positive for weakness.       Objective:    Vitals BP 126/80   Pulse 74   Temp (!) 97 F (36.1 C)   Resp 18   Wt 140 lb (63.5 kg)   SpO2 98%   BMI 21.29 kg/m    Physical Examination Physical Exam Constitutional:      Appearance: Normal appearance.  HENT:     Head: Normocephalic and atraumatic.  Cardiovascular:     Rate and Rhythm: Normal rate and regular rhythm.     Heart sounds: Normal heart sounds.  Pulmonary:     Effort: Pulmonary effort is normal.     Breath sounds: Normal breath sounds.  Abdominal:     General:  Bowel sounds are normal.     Palpations: Abdomen is soft.  Neurological:     General: No focal deficit present.     Mental Status: He is alert and oriented to person, place, and time.        Assessment & Plan:   Paroxysmal atrial fibrillation (HCC)   His rate is controlled, he is not on any anticoagulation  Pneumonia of left lung due to infectious organism  He has failed antibiotic treatment that was doxycycline  100 mg twice a day for 7 days.  His saturation drops with walking few steps to 86% and he is not feeling well so I will send him to the emergency room via ambulance.  Acute respiratory failure with hypoxia (HCC)   His saturation was 90% on sitting and dropped to 86% with exertion so I will refer him to the emergency room.    His COVID test was negative today in our office.  No follow-ups on file.   Tita Form, MD

## 2024-02-02 NOTE — Assessment & Plan Note (Signed)
 His saturation was 90% on sitting and dropped to 86% with exertion so I will refer him to the emergency room.

## 2024-02-02 NOTE — Progress Notes (Signed)
 Pharmacy Antibiotic Note  PEDRAM GOODCHILD is a 89 y.o. male admitted on 02/02/2024 with ILD flare.  Pharmacy has been consulted for vanc dosing.  Plan: Vanc 1g x 1 then 750mg  IV q24 - goal AUC 400-550     Temp (24hrs), Avg:97.8 F (36.6 C), Min:97 F (36.1 C), Max:98.5 F (36.9 C)  Recent Labs  Lab 02/02/24 1647 02/02/24 1654  WBC 7.9  --   CREATININE 1.44*  --   LATICACIDVEN  --  1.6    Estimated Creatinine Clearance: 33.1 mL/min (A) (by C-G formula based on SCr of 1.44 mg/dL (H)).    Allergies  Allergen Reactions   Statins Other (See Comments)    Muscle cramps    Aspirin  Other (See Comments)    bleeding   Atorvastatin     Other Reaction(s): MYALGIA      Thank you for allowing pharmacy to be a part of this patient's care.  Bernett Brill 02/02/2024 8:58 PM

## 2024-02-02 NOTE — Assessment & Plan Note (Signed)
 His rate is controlled, he is not on any anticoagulation

## 2024-02-02 NOTE — ED Triage Notes (Signed)
 Pt BIB EMS from [facility] after seen in upstairs clinic, dx with pulmonary fibrosis and discharged on oral ABT. New onset fatigue and blood in sputum. Saturations drop with exertion. 2 gram magnesium, duoneb, capnography 23  BP 180/90 SpO2 90 2 liter CBG 108

## 2024-02-03 DIAGNOSIS — J849 Interstitial pulmonary disease, unspecified: Secondary | ICD-10-CM

## 2024-02-03 DIAGNOSIS — I4891 Unspecified atrial fibrillation: Secondary | ICD-10-CM

## 2024-02-03 DIAGNOSIS — J8410 Pulmonary fibrosis, unspecified: Principal | ICD-10-CM

## 2024-02-03 DIAGNOSIS — J9601 Acute respiratory failure with hypoxia: Secondary | ICD-10-CM | POA: Diagnosis not present

## 2024-02-03 DIAGNOSIS — I480 Paroxysmal atrial fibrillation: Secondary | ICD-10-CM | POA: Diagnosis not present

## 2024-02-03 LAB — PHOSPHORUS: Phosphorus: 3.3 mg/dL (ref 2.5–4.6)

## 2024-02-03 LAB — RESPIRATORY PANEL BY PCR

## 2024-02-03 LAB — CBC
HCT: 44.3 % (ref 39.0–52.0)
Hemoglobin: 14.1 g/dL (ref 13.0–17.0)
MCH: 29.7 pg (ref 26.0–34.0)
MCHC: 31.8 g/dL (ref 30.0–36.0)
MCV: 93.5 fL (ref 80.0–100.0)
Platelets: 392 10*3/uL (ref 150–400)
RBC: 4.74 MIL/uL (ref 4.22–5.81)
RDW: 13.4 % (ref 11.5–15.5)
WBC: 6 10*3/uL (ref 4.0–10.5)
nRBC: 0 % (ref 0.0–0.2)

## 2024-02-03 LAB — FERRITIN: Ferritin: 77 ng/mL (ref 24–336)

## 2024-02-03 LAB — BASIC METABOLIC PANEL WITH GFR
Anion gap: 7 (ref 5–15)
BUN: 27 mg/dL — ABNORMAL HIGH (ref 8–23)
CO2: 22 mmol/L (ref 22–32)
Calcium: 8.8 mg/dL — ABNORMAL LOW (ref 8.9–10.3)
Chloride: 104 mmol/L (ref 98–111)
Creatinine, Ser: 1.4 mg/dL — ABNORMAL HIGH (ref 0.61–1.24)
GFR, Estimated: 49 mL/min — ABNORMAL LOW (ref >60)
Glucose, Bld: 148 mg/dL — ABNORMAL HIGH (ref 70–99)
Potassium: 5.1 mmol/L (ref 3.5–5.1)
Sodium: 133 mmol/L — ABNORMAL LOW (ref 135–145)

## 2024-02-03 LAB — PROCALCITONIN: Procalcitonin: <0.1 ng/mL

## 2024-02-03 LAB — MAGNESIUM: Magnesium: 2.6 mg/dL — ABNORMAL HIGH (ref 1.7–2.4)

## 2024-02-03 LAB — C-REACTIVE PROTEIN: CRP: 3.9 mg/dL — ABNORMAL HIGH (ref <1.0)

## 2024-02-03 LAB — SEDIMENTATION RATE: Sed Rate: 35 mm/h — ABNORMAL HIGH (ref 0–16)

## 2024-02-03 LAB — BRAIN NATRIURETIC PEPTIDE: B Natriuretic Peptide: 185.3 pg/mL — ABNORMAL HIGH (ref 0.0–100.0)

## 2024-02-03 LAB — CK: Total CK: 19 U/L — ABNORMAL LOW (ref 49–397)

## 2024-02-03 LAB — LACTATE DEHYDROGENASE: LDH: 191 U/L (ref 98–192)

## 2024-02-03 MED ORDER — IPRATROPIUM-ALBUTEROL 0.5-2.5 (3) MG/3ML IN SOLN
3.0000 mL | Freq: Three times a day (TID) | RESPIRATORY_TRACT | Status: DC
  Administered 2024-02-04: 3 mL via RESPIRATORY_TRACT
  Filled 2024-02-03: qty 3

## 2024-02-03 MED ORDER — SODIUM CHLORIDE 0.9 % IV SOLN
2.0000 g | Freq: Two times a day (BID) | INTRAVENOUS | Status: DC
  Administered 2024-02-03 – 2024-02-04 (×2): 2 g via INTRAVENOUS
  Filled 2024-02-03 (×2): qty 12.5

## 2024-02-03 MED ORDER — IPRATROPIUM-ALBUTEROL 0.5-2.5 (3) MG/3ML IN SOLN
3.0000 mL | Freq: Four times a day (QID) | RESPIRATORY_TRACT | Status: DC
  Administered 2024-02-03 (×2): 3 mL via RESPIRATORY_TRACT
  Filled 2024-02-03 (×2): qty 3

## 2024-02-03 MED ORDER — IPRATROPIUM-ALBUTEROL 0.5-2.5 (3) MG/3ML IN SOLN: 3.0000 mL | Freq: Two times a day (BID) | RESPIRATORY_TRACT | Status: DC

## 2024-02-03 NOTE — TOC Initial Note (Addendum)
 Transition of Care [facility]) - Initial/Assessment Note    Patient Details  Name: Jason Farmer MRN: [id number] Date of Birth: [birth year]  [facility] CM/SW Contact:    Marty Sleet, LCSW Phone Number: 02/03/2024, 1:42 PM  Clinical Narrative:                 Pt from [facility] ILF where he resides alone. Pt has RW and rollator. Pt currently recommended for SNF placement however, pt is hesitant to go to rehab and would prefer to return home. Per PT pt may progress while in the hospital to return home with [facility]. Per [place] with [facility], pt is able to admit to their SNF at discharge if needed. Pt also with new O2 requirement. TOC will continue to follow for pt's progression and discharge planning needs.   Expected Discharge Plan: Skilled Nursing Facility Barriers to Discharge: Continued Medical Work up   Patient Goals and CMS Choice Patient states their goals for this hospitalization and ongoing recovery are:: To return to IL at [email or web address] Compare Post Acute Care list provided to:: Patient Choice offered to / list presented to : Patient      Expected Discharge Plan and Services In-house Referral: Clinical Social Work Discharge Planning Services: NA Post Acute Care Choice: Home Health, Skilled Nursing Facility Living arrangements for the past 2 months: Independent Living Facility                                      Prior Living Arrangements/Services Living arrangements for the past 2 months: Independent Living Facility Lives with:: Self Patient language and need for interpreter reviewed:: Yes Do you feel safe going back to the place where you live?: Yes      Need for Family Participation in Patient Care: No (Comment) Care giver support system in place?: No (comment) Current home services: DME (Rw, rollator) Criminal Activity/Legal Involvement Pertinent to Current Situation/Hospitalization: No - Comment as needed  Activities of Daily  Living   ADL Screening (condition at time of admission) Independently performs ADLs?: Yes (appropriate for developmental age) Is the patient deaf or have difficulty hearing?: No Does the patient have difficulty seeing, even when wearing glasses/contacts?: No Does the patient have difficulty concentrating, remembering, or making decisions?: No  Permission Sought/Granted Permission sought to share information with : Facility Medical sales representative, Family Supports Permission granted to share information with : Yes, Verbal Permission Granted  Share Information with NAME: Duehring,Maria  Permission granted to share info w AGENCY: [facility]  Permission granted to share info w Relationship: Daughter  Permission granted to share info w Contact Information: [phone]  Emotional Assessment Appearance:: Appears stated age Attitude/Demeanor/Rapport: Engaged Affect (typically observed): Accepting Orientation: : Oriented to Self, Oriented to Place, Oriented to Situation, Oriented to  Time Alcohol / Substance Use: Not Applicable Psych Involvement: No (comment)  Admission diagnosis:  Pulmonary fibrosis (HCC) [J84.10] Interstitial lung disease (HCC) [J84.9] Hypoxia [R09.02] Patient Active Problem List   Diagnosis Date Noted   Acute respiratory failure with hypoxia (HCC) 02/02/2024   Subacute cough 02/02/2024   Acute renal failure superimposed on stage 3a chronic kidney disease (HCC) 02/02/2024   Interstitial lung disease (HCC) 02/02/2024   Depression 10/20/2023   Encounter for monitoring amiodarone  therapy 10/12/2023   Lewy body dementia (HCC) 10/07/2023   Acute non-recurrent frontal sinusitis 10/06/2023   Slow transit constipation 10/06/2023   Recent URI 09/27/2023  Atrial flutter with rapid ventricular response (HCC) 09/27/2023   Hallucination 09/26/2023   Impacted cerumen of left ear 08/13/2023   Hypercoagulable state due to paroxysmal atrial fibrillation (HCC) 05/01/2023    Paroxysmal atrial fibrillation (HCC) 04/16/2023   Dizziness 04/16/2023   Viral warts 02/20/2023   Carpal tunnel syndrome on right 02/20/2023   Bilateral hearing loss 02/20/2023   Benign neoplasm of colon 02/20/2023   Agoraphobia with panic disorder 02/20/2023   Cramp in lower leg 02/20/2023   Glaucoma 02/20/2023   Hematuria 02/20/2023   Hypertension 02/20/2023   Neck pain 02/20/2023   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   Trigger finger, right 02/20/2023   TIA (transient ischemic attack) 02/20/2023   Myalgia 02/20/2023   Bilateral cataracts 02/20/2023   A-fib (HCC) 11/12/2022   Corn of toe 03/23/2020   Idiopathic peripheral neuropathy 03/23/2020   Osteoarthritis of carpometacarpal (CMC) joint of thumb 11/13/2019   Arthritis of carpometacarpal (CMC) joint of both thumbs 11/13/2019   Chronic vasomotor rhinitis 11/10/2019   Noise induced tinnitus of left ear 11/10/2019   Laryngopharyngeal reflux 11/10/2019   Post-nasal drainage 11/10/2019   Tinnitus aurium, bilateral 11/10/2019   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   Rotator cuff arthropathy of left shoulder 01/04/2019   Rotator cuff arthropathy of right shoulder 01/04/2019   Community acquired pneumonia 08/04/2018   Impingement syndrome of left shoulder region 10/28/2017   Impingement syndrome of right shoulder region 10/28/2017   Chronic right shoulder pain 05/12/2016   Bilateral hand pain 05/12/2016   Primary osteoarthritis of both first carpometacarpal joints 05/12/2016   Trigger ring finger of left hand 07/16/2015   Essential hypertension, benign 12/07/2014   Muscle cramps 03/14/2014   Allergic rhinitis, seasonal 04/06/2012   Dysphagia 08/06/2010   History of colonic polyps 08/06/2010   GERD 11/05/2009   RESTLESS LEG SYNDROME 10/04/2009   Hyperlipidemia 08/23/2009   ANXIETY DISORDER, GENERALIZED 08/23/2009   TREMOR, ESSENTIAL 08/23/2009   ERECTILE DYSFUNCTION, 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 [place] [address] Phone: [phone] Fax: [phone]     Social Drivers of Health (SDOH) Social History: SDOH Screenings   Food Insecurity: No Food Insecurity (02/02/2024)  Housing: Low Risk  (02/02/2024)  Transportation Needs: No Transportation Needs (02/02/2024)  Utilities: Not At Risk (02/02/2024)  Alcohol Screen: Low Risk  (03/23/2023)  Depression (PHQ2-9): Low Risk  (03/23/2023)  Financial Resource Strain: Low Risk  (03/23/2023)  Physical Activity: Insufficiently Active (03/23/2023)  Social Connections: Moderately Isolated (02/02/2024)  Stress: No Stress Concern Present (03/23/2023)  Tobacco Use: Medium Risk (02/02/2024)  Health Literacy: Adequate Health Literacy (03/23/2023)   SDOH Interventions: Food Insecurity Interventions: Intervention Not Indicated Housing Interventions: Intervention Not Indicated Transportation Interventions: Intervention Not Indicated Utilities Interventions: Intervention Not Indicated Social Connections Interventions: Intervention Not Indicated   Readmission Risk Interventions    02/03/2024    1:40 PM  Readmission Risk Prevention Plan  Transportation Screening Complete  PCP or Specialist Appt within 3-5 Days Complete  HRI or Home Care Consult Complete  Social Work Consult for Recovery Care Planning/Counseling Complete  Palliative Care Screening Not Applicable  Medication Review Oceanographer) Complete

## 2024-02-03 NOTE — Plan of Care (Signed)

## 2024-02-03 NOTE — Plan of Care (Signed)
   Problem: Education: Goal: Knowledge of General Education information will improve Description: Including pain rating scale, medication(s)/side effects and non-pharmacologic comfort measures Outcome: Progressing   Problem: Clinical Measurements: Goal: Ability to maintain clinical measurements within normal limits will improve Outcome: Progressing Goal: Diagnostic test results will improve Outcome: Progressing Goal: Respiratory complications will improve Outcome: Progressing   Problem: Activity: Goal: Risk for activity intolerance will decrease Outcome: Progressing

## 2024-02-03 NOTE — Progress Notes (Signed)
 Progress Note   Patient: Jason Farmer:[id number] DOB: [birth year] DOA: 02/02/2024  DOS: the patient was seen and examined on 02/03/2024   Brief hospital course:  89 year old male, remote former smoker with history of GERD who was sent to the hospital yesterday for low oxygen levels with ambulation. He reports progressive dyspnea over the past month. He was treated for pneumonia with doxycycline  with no improvement in his symptoms. He reports chronic cough for decades with sputum production on a daily basis. He quit smoking 40 years ago, but smoked for 30 years multiple packs per day. He has not seen a lung doctor before. He is living at a retirement home as his wife's health has recently declined needing more support.   Assessment and Plan:  Acute hypoxic respiratory failure - Currently requiring 2 L nasal cannula to maintain O2 sats greater than 90%.  No previous history of supplemental oxygen use.  Possibility that patient may require home O2.  Acute exacerbation of interstitial lung disease/pulmonary fibrosis - CT angio negative for pulmonary emboli but noting peripheral for fibrotic changes.  Pulmonology/PCCM consulted and following closely.  No fevers, purulent sputum, worsening cough to suggest worsening infection.  Will discontinue antibiotics at this time.  Will continue IV Solu-Medrol , scheduled nebulizers, supplemental O2 as above.  Atrial fibrillation - Initiated on amiodarone  09/2023.  Holding in the setting of worsening ILD above.  Continue metoprolol , Pradaxa .  Suspected Lewy body dementia - Continue home memantine , Seroquel .  Patient appears to be at baseline, pleasant.  CKD 3A - Creatinine appears to be at baseline.  Will recheck BMP in AM.    Subjective: Patient resting comfortably this morning, nasal cannula in place on 2 L.  Denies any worsening cough, fever, chills, chest pain, nausea, vomiting, abdominal pain.  Admits to chronic cough intermittently productive of  minimal sputum.  Worsening progressive shortness of breath.  Family at bedside corroborating.  Physical Exam:  Vitals:   02/03/24 0149 02/03/24 0218 02/03/24 0543 02/03/24 1026  BP: 121/65  (!) 149/75   Pulse: (!) 58  63   Resp: 16  16   Temp: (!) 97.5 F (36.4 C)  97.8 F (36.6 C)   TempSrc: Oral  Oral   SpO2: 99% 99% 96% (!) 85%  Weight:      Height:        GENERAL:  Alert, pleasant, no acute distress  HEENT:  EOMI, nasal cannula CARDIOVASCULAR:  RRR, no murmurs appreciated RESPIRATORY: Poor air movement, noted diffuse crackles  GASTROINTESTINAL:  Soft, nontender, nondistended EXTREMITIES:  No LE edema bilaterally NEURO:  No new focal deficits appreciated SKIN:  No rashes noted PSYCH:  Appropriate mood and affect     Data Reviewed:  No new imaging to review  Previous records (including but not limited to H&P, progress notes, nursing notes, TOC management) were reviewed in assessment of this patient.  Labs: CBC: Recent Labs  Lab 02/02/24 1647 02/03/24 0539  WBC 7.9 6.0  NEUTROABS 4.9  --   HGB 16.2 14.1  HCT 51.1 44.3  MCV 92.6 93.5  PLT 418* 392   Basic Metabolic Panel: Recent Labs  Lab 02/02/24 1647 02/03/24 0539  NA 136 133*  K 4.3 5.1  CL 101 104  CO2 22 22  GLUCOSE 97 148*  BUN 27* 27*  CREATININE 1.44* 1.40*  CALCIUM  9.5 8.8*  MG  --  2.6*  PHOS  --  3.3   Liver Function Tests: Recent Labs  Lab 02/02/24 1647  AST 27  ALT 25  ALKPHOS 99  BILITOT 0.8  PROT 8.0  ALBUMIN 3.7   CBG: No results for input(s): GLUCAP in the last 168 hours.  Scheduled Meds:  dabigatran   150 mg Oral BID   escitalopram   10 mg Oral QHS   ezetimibe   10 mg Oral QPM   ipratropium-albuterol   3 mL Nebulization Q6H   memantine   5 mg Oral BID   methylPREDNISolone  (SOLU-MEDROL ) injection  60 mg Intravenous Q12H   metoprolol  tartrate  50 mg Oral BID   mirtazapine   7.5 mg Oral QHS   pantoprazole   40 mg Oral Daily   sodium chloride  flush  3 mL Intravenous  Q12H   Continuous Infusions: PRN Meds:.acetaminophen , albuterol , melatonin, polyethylene glycol  Family Communication: Family at bedside  Disposition: Status is: Inpatient Remains inpatient appropriate because: ILD flare     Time spent: 36 minutes  Length of inpatient stay: 1 days  Author: Jodeane Mulligan, DO 02/03/2024 12:41 PM  For on call review [email or web address].

## 2024-02-03 NOTE — NC FL2 (Signed)
 [place]  MEDICAID [id number] LEVEL OF CARE FORM     IDENTIFICATION  Patient Name: Jason Farmer Birthdate: 1935/03/14 Sex: male Admission Date (Current Location): 02/02/2024  [facility] and [place] Number:  Producer, television/film/video and Address:  [facility],  [address] [place], [place] [address]      Provider Number: [id number]  Attending Physician Name and Address:  Jodeane Mulligan, DO  Relative Name and Phone Number:  Beverlie Buckle (Daughter)  [phone]    Current Level of Care: Hospital Recommended Level of Care: Skilled Nursing Facility Prior Approval Number:    Date Approved/Denied:   PASRR Number: [id number]  Discharge Plan: SNF    Current Diagnoses: Patient Active Problem List   Diagnosis Date Noted   Acute respiratory failure with hypoxia (HCC) 02/02/2024   Subacute cough 02/02/2024   Acute renal failure superimposed on stage 3a chronic kidney disease (HCC) 02/02/2024   Interstitial lung disease (HCC) 02/02/2024   Depression 10/20/2023   Encounter for monitoring amiodarone  therapy 10/12/2023   Lewy body dementia (HCC) 10/07/2023   Acute non-recurrent frontal sinusitis 10/06/2023   Slow transit constipation 10/06/2023   Recent URI 09/27/2023   Atrial flutter with rapid ventricular response (HCC) 09/27/2023   Hallucination 09/26/2023   Impacted cerumen of left ear 08/13/2023   Hypercoagulable state due to paroxysmal atrial fibrillation (HCC) 05/01/2023   Paroxysmal atrial fibrillation (HCC) 04/16/2023   Dizziness 04/16/2023   Viral warts 02/20/2023   Carpal tunnel syndrome on right 02/20/2023   Bilateral hearing loss 02/20/2023   Benign neoplasm of colon 02/20/2023   Agoraphobia with panic disorder 02/20/2023   Cramp in lower leg 02/20/2023   Glaucoma 02/20/2023   Hematuria 02/20/2023   Hypertension 02/20/2023   Neck pain 02/20/2023   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   Trigger finger, right 02/20/2023   TIA (transient  ischemic attack) 02/20/2023   Myalgia 02/20/2023   Bilateral cataracts 02/20/2023   A-fib (HCC) 11/12/2022   Corn of toe 03/23/2020   Idiopathic peripheral neuropathy 03/23/2020   Osteoarthritis of carpometacarpal (CMC) joint of thumb 11/13/2019   Arthritis of carpometacarpal (CMC) joint of both thumbs 11/13/2019   Chronic vasomotor rhinitis 11/10/2019   Noise induced tinnitus of left ear 11/10/2019   Laryngopharyngeal reflux 11/10/2019   Post-nasal drainage 11/10/2019   Tinnitus aurium, bilateral 11/10/2019   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   Rotator cuff arthropathy of left shoulder 01/04/2019   Rotator cuff arthropathy of right shoulder 01/04/2019   Community acquired pneumonia 08/04/2018   Impingement syndrome of left shoulder region 10/28/2017   Impingement syndrome of right shoulder region 10/28/2017   Chronic right shoulder pain 05/12/2016   Bilateral hand pain 05/12/2016   Primary osteoarthritis of both first carpometacarpal joints 05/12/2016   Trigger ring finger of left hand 07/16/2015   Essential hypertension, benign 12/07/2014   Muscle cramps 03/14/2014   Allergic rhinitis, seasonal 04/06/2012   Dysphagia 08/06/2010   History of colonic polyps 08/06/2010   GERD 11/05/2009   RESTLESS LEG SYNDROME 10/04/2009   Hyperlipidemia 08/23/2009   ANXIETY DISORDER, GENERALIZED 08/23/2009   TREMOR, ESSENTIAL 08/23/2009   ERECTILE DYSFUNCTION, 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    Orientation RESPIRATION BLADDER Height & Weight     Self, Time, Situation, Place  O2 (2L) Continent Weight: 139 lb 15.9 oz (63.5 kg) Height:  5' 8 (172.7 cm)  BEHAVIORAL SYMPTOMS/MOOD NEUROLOGICAL BOWEL NUTRITION STATUS      Continent Diet (See DC summary)  AMBULATORY STATUS COMMUNICATION OF NEEDS Skin   Limited  Assist Verbally Normal                       Personal Care Assistance Level of Assistance  Bathing, Feeding, Dressing Bathing Assistance: Limited assistance Feeding assistance: Independent Dressing  Assistance: Limited assistance     Functional Limitations Info  Sight, Hearing, Speech Sight Info: Impaired Hearing Info: Impaired Speech Info: Adequate    SPECIAL CARE FACTORS FREQUENCY  PT (By licensed PT), OT (By licensed OT)     PT Frequency: 5x/wk OT Frequency: 5x/wk            Contractures Contractures Info: Not present    Additional Factors Info  Code Status, Allergies, Psychotropic Code Status Info: FULL Allergies Info: Statins, Aspirin , Atorvastatin Psychotropic Info: See MAR         Current Medications (02/03/2024):  This is the current hospital active medication list Current Facility-Administered Medications  Medication Dose Route Frequency Provider Last Rate Last Admin   acetaminophen  (TYLENOL ) tablet 1,000 mg  1,000 mg Oral Q6H PRN Segars, Arlyce Lambert, MD       albuterol  (PROVENTIL ) (2.5 MG/3ML) 0.083% nebulizer solution 2.5 mg  2.5 mg Nebulization Q4H PRN Segars, Arlyce Lambert, MD       ceFEPIme (MAXIPIME) 2 g in sodium chloride  0.9 % 100 mL IVPB  2 g Intravenous Q12H Legge, Justin M, RPH       dabigatran  (PRADAXA ) capsule 150 mg  150 mg Oral BID Segars, Jonathan, MD   150 mg at 02/03/24 0915   escitalopram  (LEXAPRO ) tablet 10 mg  10 mg Oral QHS Segars, Jonathan, MD   10 mg at 02/02/24 2228   ezetimibe  (ZETIA ) tablet 10 mg  10 mg Oral QPM Segars, Jonathan, MD   10 mg at 02/02/24 2204   ipratropium-albuterol  (DUONEB) 0.5-2.5 (3) MG/3ML nebulizer solution 3 mL  3 mL Nebulization Q6H Dewald, Jonathan B, MD   3 mL at 02/03/24 1344   melatonin tablet 6 mg  6 mg Oral QHS PRN Segars, Jonathan, MD       memantine  (NAMENDA ) tablet 5 mg  5 mg Oral BID Segars, Jonathan, MD   5 mg at 02/03/24 0915   methylPREDNISolone  sodium succinate (SOLU-MEDROL ) 125 mg/2 mL injection 60 mg  60 mg Intravenous Q12H Segars, Jonathan, MD   60 mg at 02/03/24 0813   metoprolol  tartrate (LOPRESSOR ) tablet 50 mg  50 mg Oral BID Segars, Jonathan, MD   50 mg at 02/03/24 0915   mirtazapine  (REMERON )  tablet 7.5 mg  7.5 mg Oral QHS Segars, Jonathan, MD   7.5 mg at 02/02/24 2203   pantoprazole  (PROTONIX ) EC tablet 40 mg  40 mg Oral Daily Segars, Arlyce Lambert, MD   40 mg at 02/03/24 0915   polyethylene glycol (MIRALAX  / GLYCOLAX ) packet 17 g  17 g Oral Daily PRN Arnulfo Larch, MD       sodium chloride  flush (NS) 0.9 % injection 3 mL  3 mL Intravenous Q12H Segars, Jonathan, MD   3 mL at 02/03/24 0454     Discharge Medications: Please see discharge summary for a list of discharge medications.  Relevant Imaging Results:  Relevant Lab Results:   Additional Information SSN: [id number]  Marty Sleet, LCSW

## 2024-02-03 NOTE — Evaluation (Signed)
 Physical Therapy Evaluation Patient Details Name: Jason Farmer MRN: [id number] DOB: 1935/06/30 Today's Date: 02/03/2024  History of Present Illness  89 y.o. male who developed shortness of breath and cough about 10 days prior to admission. Pt was seen at the emergency department. Dx of probable pneumonia was identified and patient took a course of doxycycline .  He completed doxycycline .  He continued to have shortness of breath and  general weakness and cough. He followed up with PCP  and  Oxygen saturation went down to 86% with minor activity. Dx of ILD. PMH: dementia, afib, CAD, HTN, MRI showed meningioma along the left lateral posterior fossa 09/26/23.  Clinical Impression  Pt admitted with above diagnosis. Pt ambulated 23' with RW with 3 standing rest breaks 2* 3/4 dyspnea. SpO2 85% on room air walking, 90-92% on 3L O2 while walking with frequent verbal cues for pursed lip breathing. At baseline pt ambulates without AD. Good progress expected.  Pt currently with functional limitations due to the deficits listed below (see PT Problem List). Pt will benefit from acute skilled PT to increase their independence and safety with mobility to allow discharge.           If plan is discharge home, recommend the following: A lot of help with walking and/or transfers;A little help with bathing/dressing/bathroom;Assistance with cooking/housework;Assist for transportation;Help with stairs or ramp for entrance   Can travel by private vehicle   Yes    Equipment Recommendations None recommended by PT  Recommendations for Other Services       Functional Status Assessment Patient has had a recent decline in their functional status and demonstrates the ability to make significant improvements in function in a reasonable and predictable amount of time.     Precautions / Restrictions Precautions Precautions: Other (comment) Recall of Precautions/Restrictions: Intact Precaution/Restrictions Comments:  monitor O2 Restrictions Weight Bearing Restrictions Per Provider Order: No      Mobility  Bed Mobility Overal bed mobility: Modified Independent             General bed mobility comments: HOB up, used rail    Transfers Overall transfer level: Needs assistance Equipment used: Rolling walker (2 wheels) Transfers: Sit to/from Stand Sit to Stand: Supervision           General transfer comment: VCs hand placement    Ambulation/Gait Ambulation/Gait assistance: Supervision Gait Distance (Feet): 90 Feet Assistive device: Rolling walker (2 wheels) Gait Pattern/deviations: Step-through pattern, Decreased stride length Gait velocity: decr     General Gait Details: 3 standing rest breaks 2* 3/4 dyspnea. SpO2 85% on room air walking, 90-92% on 3L O2 walking, frequent VCs for PLB  Stairs            Wheelchair Mobility     Tilt Bed    Modified Rankin (Stroke Patients Only)       Balance Overall balance assessment: Modified Independent                                           Pertinent Vitals/Pain Pain Assessment Pain Assessment: No/denies pain    Home Living Family/patient expects to be discharged to:: Private residence Living Arrangements: Spouse/significant other Available Help at Discharge: Family;Available PRN/intermittently Type of Home: Independent living facility Home Access: Level entry       Home Layout: One level Home Equipment: Rollator (4 wheels);Rolling Walker (2 wheels) Additional Comments: from  [facility]    Prior Function Prior Level of Function : Independent/Modified Independent             Mobility Comments: walks without AD, not on O2 at baseline ADLs Comments: independent     Extremity/Trunk Assessment   Upper Extremity Assessment Upper Extremity Assessment: Overall WFL for tasks assessed    Lower Extremity Assessment Lower Extremity Assessment: Overall WFL for tasks assessed    Cervical /  Trunk Assessment Cervical / Trunk Assessment: Normal  Communication   Communication Communication: Impaired Factors Affecting Communication: Hearing impaired    Cognition Arousal: Alert Behavior During Therapy: WFL for tasks assessed/performed   PT - Cognitive impairments: No apparent impairments                         Following commands: Intact       Cueing       General Comments      Exercises General Exercises - Lower Extremity Ankle Circles/Pumps: AROM, Both, 10 reps, Supine   Assessment/Plan    PT Assessment Patient needs continued PT services  PT Problem List Decreased activity tolerance;Cardiopulmonary status limiting activity       PT Treatment Interventions Gait training;Functional mobility training;Therapeutic exercise;DME instruction    PT Goals (Current goals can be found in the Care Plan section)  Acute Rehab PT Goals Patient Stated Goal: to be able to walk without shortness of breath PT Goal Formulation: With patient Time For Goal Achievement: 02/17/24 Potential to Achieve Goals: Good    Frequency Min 3X/week     Co-evaluation               AM-PAC PT 6 Clicks Mobility  Outcome Measure Help needed turning from your back to your side while in a flat bed without using bedrails?: None Help needed moving from lying on your back to sitting on the side of a flat bed without using bedrails?: None Help needed moving to and from a bed to a chair (including a wheelchair)?: None Help needed standing up from a chair using your arms (e.g., wheelchair or bedside chair)?: None Help needed to walk in hospital room?: A Little Help needed climbing 3-5 steps with a railing? : A Little 6 Click Score: 22    End of Session Equipment Utilized During Treatment: Gait belt;Oxygen Activity Tolerance: Patient limited by fatigue Patient left: in chair;with call bell/phone within reach;with family/visitor present Nurse Communication: Mobility  status PT Visit Diagnosis: Difficulty in walking, not elsewhere classified (R26.2)    Time: 5621-3086 PT Time Calculation (min) (ACUTE ONLY): 26 min   Charges:   PT Evaluation $PT Eval Moderate Complexity: 1 Mod PT Treatments $Gait Training: 8-22 mins PT General Charges $$ ACUTE PT VISIT: 1 Visit

## 2024-02-03 NOTE — Consult Note (Signed)
 NAME:  IVA POSTEN, MRN:  [id number], DOB:  Apr 15, 1935, LOS: 1 ADMISSION DATE:  02/02/2024, CONSULTATION DATE:  02/03/2024 REFERRING MD:  TRH CHIEF COMPLAINT:  ILD   History of Present Illness:  Chesney Klimaszewski is an 89 year old male, remote former smoker with history of GERD who was sent to the hospital yesterday for low oxygen levels with ambulation. He reports progressive dyspnea over the past month. He was treated for pneumonia with doxycycline  with no improvement in his symptoms. He reports chronic cough for decades with sputum production on a daily basis. He quit smoking 40 years ago, but smoked for 30 years multiple packs per day. He has not seen a lung doctor before. He is living at a retirement home as his wife's health has recently declined needing more support.   CTA PE study is negative for pulmonary emboli. He has peripheral fibrotic changes with craniocaudal gradient. Compared to 2023, increase in left upper lobe fibrosis. Emphysematous changes as well.   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 Denies significant dust or chemical exposures but reports hobby of wood working.   Pertinent  Medical History   Past Medical History:  Diagnosis Date   Allergy    Anxiety    not a problem for a long time (11/27/2016)   Arthritis    wrists, knees, shoulders, back (11/27/2016)   Chronic lower back pain    5th disc is protruding (11/27/2016)   Essential tremor    GERD (gastroesophageal reflux disease)    Glaucoma    Heart murmur    dx'd in [employer] in the 1960s; haven't been seen for it since (11/27/2016)   Hepatitis ~ 1958   jaundice kind   HOH (hard of hearing)    Hyperlipemia    Migraine    none since my neck OR (11/27/2016)   Seasonal allergies    TIA (transient ischemic attack) 04/2015   Wears glasses    Wears hearing aid      Significant Hospital Events: Including procedures, antibiotic start and stop dates in addition to other pertinent events   6/10 admitted for respiratory  failure  Interim History / Subjective:  As above  Objective    Blood pressure (!) 149/75, pulse 63, temperature 97.8 F (36.6 C), temperature source Oral, resp. rate 16, height 5' 8 (1.727 m), weight 63.5 kg, SpO2 96%.        Intake/Output Summary (Last 24 hours) at 02/03/2024 0753 Last data filed at 02/03/2024 0400 Gross per 24 hour  Intake 100 ml  Output 575 ml  Net -475 ml   Filed Weights   02/02/24 2211  Weight: 63.5 kg    Examination: General: elderly male, no acute distress HENT: [place]/AT, moist mucous membranes Lungs: crackles scattered throughout, no wheezing Cardiovascular: rrr, no murmurs Abdomen: soft, non-tender, non-distended Extremities: warm, no edema Neuro: alert, oriented x 3 GU: n/a  Resolved problem list   Assessment and Plan  Acute Hypoxemic Respiratory Failure Pulmonary Fibrosis Possible Pneumonia vs ILD Flare vs Amiodarone  Toxicity Emphysema Paroxysmal Atrial Fibrillation  Plan: - continue duoneb treatments - continue IV solumedrol - MRSA screen is negative, can stop vancomycin - ok to continue cefepime for now - check autoimmune panel and hypersensitivity pneumonitis panel - check LDH, Procal - check sputum culture - check resting and ambulatory oxygen levels to determine need for home O2 - discussed pulmonary fibrosis diagnosis in detail and will need follow up in clinic to discuss starting therapy with nintedanib (Ofev) and pirfenidone (Esbriet) -  referred him and his family to the pulmonary fibrosis foundation website for more info  PCCM will continue to follow.   Best Practice (right click and Reselect all SmartList Selections daily)   Per Primary  Labs   CBC: Recent Labs  Lab 02/02/24 1647 02/03/24 0539  WBC 7.9 6.0  NEUTROABS 4.9  --   HGB 16.2 14.1  HCT 51.1 44.3  MCV 92.6 93.5  PLT 418* 392    Basic Metabolic Panel: Recent Labs  Lab 02/02/24 1647 02/03/24 0539  NA 136 133*  K 4.3 5.1  CL 101 104  CO2 22 22   GLUCOSE 97 148*  BUN 27* 27*  CREATININE 1.44* 1.40*  CALCIUM  9.5 8.8*  MG  --  2.6*  PHOS  --  3.3   GFR: Estimated Creatinine Clearance: 34 mL/min (A) (by C-G formula based on SCr of 1.4 mg/dL (H)). Recent Labs  Lab 02/02/24 1647 02/02/24 1654 02/03/24 0539  WBC 7.9  --  6.0  LATICACIDVEN  --  1.6  --     Liver Function Tests: Recent Labs  Lab 02/02/24 1647  AST 27  ALT 25  ALKPHOS 99  BILITOT 0.8  PROT 8.0  ALBUMIN 3.7   No results for input(s): LIPASE, AMYLASE in the last 168 hours. No results for input(s): AMMONIA in the last 168 hours.  ABG    Component Value Date/Time   PHART 7.41 02/02/2024 1609   PCO2ART 38 02/02/2024 1609   PO2ART 79 (L) 02/02/2024 1609   HCO3 24.1 02/02/2024 1609   ACIDBASEDEF 0.3 02/02/2024 1609   O2SAT 98.5 02/02/2024 1609     Coagulation Profile: No results for input(s): INR, PROTIME in the last 168 hours.  Cardiac Enzymes: No results for input(s): CKTOTAL, CKMB, CKMBINDEX, TROPONINI in the last 168 hours.  HbA1C: No results found for: HGBA1C  CBG: No results for input(s): GLUCAP in the last 168 hours.  Review of Systems:   Review of Systems  Constitutional:  Negative for chills, fever, malaise/fatigue and weight loss.  HENT:  Negative for congestion, sinus pain and sore throat.   Eyes: Negative.   Respiratory:  Positive for cough, sputum production and shortness of breath. Negative for hemoptysis and wheezing.   Cardiovascular:  Negative for chest pain, palpitations, orthopnea, claudication and leg swelling.  Gastrointestinal:  Negative for abdominal pain, heartburn, nausea and vomiting.  Genitourinary: Negative.   Musculoskeletal:  Negative for joint pain and myalgias.  Skin:  Negative for rash.  Neurological:  Negative for weakness.  Endo/Heme/Allergies: Negative.   Psychiatric/Behavioral: Negative.       Past Medical History:  He,  has a past medical history of Allergy, Anxiety,  Arthritis, Chronic lower back pain, Essential tremor, GERD (gastroesophageal reflux disease), Glaucoma, Heart murmur, Hepatitis (~ 1958), HOH (hard of hearing), Hyperlipemia, Migraine, Seasonal allergies, TIA (transient ischemic attack) (04/2015), Wears glasses, and Wears hearing aid.   Surgical History:   Past Surgical History:  Procedure Laterality Date   ANTERIOR CERVICAL DECOMP/DISCECTOMY FUSION  2007   APPENDECTOMY     BACK SURGERY     CARPAL TUNNEL RELEASE Right 10/12/2013   Procedure: RIGHT CARPAL TUNNEL RELEASE;  Surgeon: Kemp Patter, MD;  Location: [facility];  Service: Orthopedics;  Laterality: Right;   CATARACT EXTRACTION W/ INTRAOCULAR LENS  IMPLANT, BILATERAL Bilateral    COLONOSCOPY     HEMORRHOID SURGERY     KNEE ARTHROSCOPY Left 1970s   NASAL SEPTOPLASTY W/ TURBINOPLASTY Bilateral 02/15/2013   Procedure:  NASAL SEPTOPLASTY WITH BILATER TURBINATE REDUCTION;  Surgeon: Lawence Press, MD;  Location: [facility];  Service: ENT;  Laterality: Bilateral;   TONSILLECTOMY     TRIGGER FINGER RELEASE  06/08/2012   Procedure: RELEASE TRIGGER FINGER/A-1 PULLEY;  Surgeon: Kemp Patter, MD;  Location: [facility];  Service: Orthopedics;  Laterality: Left;  Release A-1 Pulley Left Long Finger   TRIGGER FINGER RELEASE  07/14/2012   Procedure: RELEASE TRIGGER FINGER/A-1 PULLEY;  Surgeon: Kemp Patter, MD;  Location: [facility];  Service: Orthopedics;  Laterality: Right;   TRIGGER FINGER RELEASE Right 10/12/2013   Procedure: RELEASE TRIGGER FINGER/A-1 PULLEY RIGHT MIDDLE FINGER;  Surgeon: Kemp Patter, MD;  Location:  SURGERY CENTER;  Service: Orthopedics;  Laterality: Right;   UPPER GASTROINTESTINAL ENDOSCOPY       Social History:   reports that he quit smoking about 39 years ago. His smoking use included cigarettes. He started smoking about 69 years ago. He has a 60 pack-year smoking history. He has never used smokeless tobacco.  He reports that he does not drink alcohol and does not use drugs.   Family History:  His family history includes Dementia in his father; Glaucoma in his mother; Heart failure in his father. There is no history of Colon cancer, Rectal cancer, Stomach cancer, Esophageal cancer, or Colon polyps.   Allergies Allergies  Allergen Reactions   Statins Other (See Comments)    Muscle cramps/pain    Aspirin  Other (See Comments)    Bleeding   Atorvastatin Other (See Comments)    Muscle pain     Home Medications  Prior to Admission medications   Medication Sig Start Date End Date Taking? Authorizing Provider  acetaminophen  (TYLENOL ) 325 MG tablet Take 325 mg by mouth every 6 (six) hours as needed for mild pain (pain score 1-3), moderate pain (pain score 4-6) or headache.   Yes [provider]  albuterol  (VENTOLIN  HFA) 108 (90 Base) MCG/ACT inhaler Inhale 2 puffs into the lungs every 6 (six) hours as needed for wheezing or shortness of breath. 02/02/24  Yes Amin, Saad, MD  amiodarone  (PACERONE ) 200 MG tablet Take 1 tablet (200 mg total) by mouth daily. 10/12/23  Yes Fenton, Clint R, PA  dabigatran  (PRADAXA ) 150 MG CAPS capsule Take 1 capsule (150 mg total) by mouth 2 (two) times daily. 10/12/23  Yes Fenton, Clint R, PA  escitalopram  (LEXAPRO ) 10 MG tablet Take 1 tablet (10 mg total) by mouth daily. Patient taking differently: Take 10 mg by mouth at bedtime. 07/14/23  Yes Wayne Haines, MD  ezetimibe  (ZETIA ) 10 MG tablet TAKE ONE TABLET BY MOUTH IN THE EVENING 01/19/24  Yes Amin, Saad, MD  fexofenadine  (ALLEGRA ) 180 MG tablet Take 1 tablet (180 mg total) by mouth daily. 12/17/23  Yes Wayne Haines, MD  memantine  (NAMENDA ) 5 MG tablet Take 5 mg by mouth in the morning and at bedtime. 01/20/24  Yes [provider]  metoprolol  tartrate (LOPRESSOR ) 50 MG tablet Take 1 tablet (50 mg total) by mouth 2 (two) times daily. Patient taking differently: Take 50 mg by mouth in the morning and at  bedtime. 11/13/23  Yes Amin, Saad, MD  mirtazapine  (REMERON ) 15 MG tablet Take 1 tablet (15 mg total) by mouth every evening. 01/19/24  Yes Tita Form, MD  omeprazole  (PRILOSEC) 20 MG capsule Take 1 capsule (20 mg total) by mouth every morning. 01/19/24  Yes Tita Form, MD  QUEtiapine  (SEROQUEL ) 25  MG tablet Take 1 tablet (25 mg total) by mouth at bedtime. 12/14/23  Yes Wayne Haines, MD  cefdinir (OMNICEF) 300 MG capsule Take 1 capsule (300 mg total) by mouth 2 (two) times daily. Patient not taking: Reported on 02/02/2024 02/02/24   Amin, Saad, MD  doxycycline  (VIBRAMYCIN ) 100 MG capsule Take 1 capsule (100 mg total) by mouth 2 (two) times daily. Patient not taking: Reported on 02/02/2024 01/26/24   Editha Goring A, PA-C  fluticasone  (FLONASE ) 50 MCG/ACT nasal spray Place 1 spray into both nostrils in the morning and at bedtime. Patient not taking: Reported on 02/02/2024 08/13/23 08/12/24  Wayne Haines, MD  meclizine  (ANTIVERT ) 12.5 MG tablet Take 1 tablet (12.5 mg total) by mouth 3 (three) times daily as needed for dizziness. Patient not taking: Reported on 02/02/2024 08/13/23   Wayne Haines, MD  memantine  (NAMENDA ) 10 MG tablet Take 1 tablet (10 mg total) by mouth 2 (two) times daily. Patient not taking: Reported on 02/02/2024 10/07/23   Amin, Saad, MD  polyethylene glycol (MIRALAX  / GLYCOLAX ) 17 g packet Take 17 g by mouth daily. Patient not taking: Reported on 02/02/2024 10/06/23   Amin, Saad, MD  predniSONE  (DELTASONE ) 10 MG tablet Take 4 tablets (40 mg total) by mouth daily with breakfast for 5 days. Patient not taking: Reported on 02/02/2024 02/02/24 02/07/24  Amin, Saad, MD  sodium chloride  (OCEAN) 0.65 % nasal spray Place 1 spray into the nose as needed for congestion. Patient not taking: Reported on 02/02/2024 10/06/23   Amin, Saad, MD     Critical care time: n/a    Duaine German, MD [facility] Office: [phone]   See Amion for personal pager PCCM on call pager  [phone] until 7pm. Please call Elink 7p-7a. [phone]

## 2024-02-03 NOTE — Hospital Course (Addendum)
 89 year old male, remote former smoker with history of GERD who was sent to the hospital yesterday for low oxygen levels with ambulation. He reports progressive dyspnea over the past month. He was treated for pneumonia with doxycycline  with no improvement in his symptoms. He reports chronic cough for decades with sputum production on a daily basis. He quit smoking 40 years ago, but smoked for 30 years multiple packs per day. He has not seen a lung doctor before. He is living at a retirement home as his wife's health has recently declined needing more support.    Assessment and Plan:   Acute hypoxic respiratory failure - Initially requiring 2 L nasal cannula to maintain O2 sats greater than 90%.  No previous history of supplemental oxygen use.  Possibility that patient may require home O2.  Appears to be slowly improving.  Will attempt to wean O2 as tolerated.   Acute exacerbation of interstitial lung disease/pulmonary fibrosis - CT angio negative for pulmonary emboli but noting peripheral for fibrotic changes.  Pulmonology/PCCM consulted and following closely.  No fevers, purulent sputum, worsening cough to suggest worsening infection.  Discontinued vancomycin, continues on empiric cefepime.  Will continue IV Solu-Medrol , scheduled nebulizers, supplemental O2 as above.   Atrial fibrillation - Initiated on amiodarone  09/2023.  Holding in the setting of worsening ILD above.  Continue metoprolol , Pradaxa .   Suspected Lewy body dementia - Continue home memantine , Seroquel .  Patient appears to be at baseline, pleasant.   CKD 3A - Creatinine appears to be at baseline.  Will recheck BMP in AM.   Goals of care - Anticipate discharge tomorrow

## 2024-02-03 NOTE — Progress Notes (Signed)
 SATURATION QUALIFICATIONS: (This note is used to comply with regulatory documentation for home oxygen)  Patient Saturations on Room Air at Rest = 93%  Patient Saturations on Room Air while Ambulating = 85%  Patient Saturations on 3 Liters of oxygen while Ambulating = 90%  Please briefly explain why patient needs home oxygen: to maintain appropriate SpO2 levels.   Lynann Sandman Kistler PT 02/03/2024  Acute Rehabilitation Services  Office [phone]

## 2024-02-04 ENCOUNTER — Telehealth: Payer: Self-pay | Admitting: Pulmonary Disease

## 2024-02-04 DIAGNOSIS — R4182 Altered mental status, unspecified: Secondary | ICD-10-CM | POA: Diagnosis not present

## 2024-02-04 DIAGNOSIS — Z7401 Bed confinement status: Secondary | ICD-10-CM | POA: Diagnosis not present

## 2024-02-04 DIAGNOSIS — R41841 Cognitive communication deficit: Secondary | ICD-10-CM | POA: Diagnosis not present

## 2024-02-04 DIAGNOSIS — R1314 Dysphagia, pharyngoesophageal phase: Secondary | ICD-10-CM | POA: Diagnosis not present

## 2024-02-04 DIAGNOSIS — J9601 Acute respiratory failure with hypoxia: Secondary | ICD-10-CM | POA: Diagnosis not present

## 2024-02-04 DIAGNOSIS — J9621 Acute and chronic respiratory failure with hypoxia: Secondary | ICD-10-CM | POA: Diagnosis not present

## 2024-02-04 DIAGNOSIS — K219 Gastro-esophageal reflux disease without esophagitis: Secondary | ICD-10-CM | POA: Diagnosis not present

## 2024-02-04 DIAGNOSIS — N1831 Chronic kidney disease, stage 3a: Secondary | ICD-10-CM | POA: Diagnosis not present

## 2024-02-04 DIAGNOSIS — J9611 Chronic respiratory failure with hypoxia: Secondary | ICD-10-CM | POA: Diagnosis not present

## 2024-02-04 DIAGNOSIS — R059 Cough, unspecified: Secondary | ICD-10-CM | POA: Diagnosis not present

## 2024-02-04 DIAGNOSIS — M6281 Muscle weakness (generalized): Secondary | ICD-10-CM | POA: Diagnosis not present

## 2024-02-04 DIAGNOSIS — J189 Pneumonia, unspecified organism: Secondary | ICD-10-CM | POA: Diagnosis not present

## 2024-02-04 DIAGNOSIS — R918 Other nonspecific abnormal finding of lung field: Secondary | ICD-10-CM | POA: Diagnosis not present

## 2024-02-04 DIAGNOSIS — R1310 Dysphagia, unspecified: Secondary | ICD-10-CM | POA: Diagnosis not present

## 2024-02-04 DIAGNOSIS — J849 Interstitial pulmonary disease, unspecified: Secondary | ICD-10-CM | POA: Diagnosis not present

## 2024-02-04 DIAGNOSIS — I10 Essential (primary) hypertension: Secondary | ICD-10-CM | POA: Diagnosis not present

## 2024-02-04 DIAGNOSIS — I480 Paroxysmal atrial fibrillation: Secondary | ICD-10-CM | POA: Diagnosis not present

## 2024-02-04 DIAGNOSIS — R5383 Other fatigue: Secondary | ICD-10-CM | POA: Diagnosis not present

## 2024-02-04 DIAGNOSIS — R278 Other lack of coordination: Secondary | ICD-10-CM | POA: Diagnosis not present

## 2024-02-04 DIAGNOSIS — J8410 Pulmonary fibrosis, unspecified: Secondary | ICD-10-CM | POA: Diagnosis not present

## 2024-02-04 DIAGNOSIS — R2689 Other abnormalities of gait and mobility: Secondary | ICD-10-CM | POA: Diagnosis not present

## 2024-02-04 DIAGNOSIS — E785 Hyperlipidemia, unspecified: Secondary | ICD-10-CM | POA: Diagnosis not present

## 2024-02-04 DIAGNOSIS — F339 Major depressive disorder, recurrent, unspecified: Secondary | ICD-10-CM | POA: Diagnosis not present

## 2024-02-04 DIAGNOSIS — I4891 Unspecified atrial fibrillation: Secondary | ICD-10-CM | POA: Diagnosis not present

## 2024-02-04 LAB — ANA W/REFLEX IF POSITIVE: Anti Nuclear Antibody (ANA): NEGATIVE

## 2024-02-04 LAB — CBC
HCT: 42.7 % (ref 39.0–52.0)
Hemoglobin: 13.9 g/dL (ref 13.0–17.0)
MCH: 30 pg (ref 26.0–34.0)
MCHC: 32.6 g/dL (ref 30.0–36.0)
MCV: 92.2 fL (ref 80.0–100.0)
Platelets: 456 10*3/uL — ABNORMAL HIGH (ref 150–400)
RBC: 4.63 MIL/uL (ref 4.22–5.81)
RDW: 13.9 % (ref 11.5–15.5)
WBC: 18.7 10*3/uL — ABNORMAL HIGH (ref 4.0–10.5)
nRBC: 0 % (ref 0.0–0.2)

## 2024-02-04 LAB — ANCA PROFILE
Anti-MPO Antibodies: <0.2 U (ref 0.0–0.9)
Anti-PR3 Antibodies: <0.2 U (ref 0.0–0.9)
Atypical P-ANCA titer: <1:20 {titer}
C-ANCA: <1:20 {titer}
P-ANCA: <1:20 {titer}

## 2024-02-04 LAB — MAGNESIUM: Magnesium: 2.4 mg/dL (ref 1.7–2.4)

## 2024-02-04 LAB — RHEUMATOID FACTOR: Rheumatoid fact SerPl-aCnc: 12.3 [IU]/mL (ref <14.0)

## 2024-02-04 LAB — BASIC METABOLIC PANEL WITH GFR
Anion gap: 8 (ref 5–15)
BUN: 35 mg/dL — ABNORMAL HIGH (ref 8–23)
CO2: 22 mmol/L (ref 22–32)
Calcium: 9.2 mg/dL (ref 8.9–10.3)
Chloride: 104 mmol/L (ref 98–111)
Creatinine, Ser: 1.63 mg/dL — ABNORMAL HIGH (ref 0.61–1.24)
GFR, Estimated: 41 mL/min — ABNORMAL LOW (ref >60)
Glucose, Bld: 146 mg/dL — ABNORMAL HIGH (ref 70–99)
Potassium: 4.8 mmol/L (ref 3.5–5.1)
Sodium: 134 mmol/L — ABNORMAL LOW (ref 135–145)

## 2024-02-04 LAB — C4 COMPLEMENT: Complement C4, Body Fluid: 21 mg/dL (ref 12–38)

## 2024-02-04 LAB — ANTI-SCLERODERMA ANTIBODY: Scleroderma (Scl-70) (ENA) Antibody, IgG: <0.2 AI (ref 0.0–0.9)

## 2024-02-04 LAB — C3 COMPLEMENT: C3 Complement: 184 mg/dL — ABNORMAL HIGH (ref 82–167)

## 2024-02-04 MED ORDER — PREDNISONE 5 MG PO TABS: ORAL_TABLET | ORAL | 0 refills | Status: AC

## 2024-02-04 MED ORDER — IPRATROPIUM-ALBUTEROL 0.5-2.5 (3) MG/3ML IN SOLN: 3.0000 mL | Freq: Two times a day (BID) | RESPIRATORY_TRACT | Status: DC

## 2024-02-04 MED ORDER — PREDNISONE 20 MG PO TABS: 30.0000 mg | ORAL_TABLET | Freq: Every day | ORAL | Status: DC

## 2024-02-04 MED ORDER — PREDNISONE 20 MG PO TABS: 20.0000 mg | ORAL_TABLET | Freq: Every day | ORAL | Status: DC

## 2024-02-04 MED ORDER — PREDNISONE 5 MG PO TABS: 5.0000 mg | ORAL_TABLET | Freq: Every day | ORAL | Status: DC

## 2024-02-04 MED ORDER — PREDNISONE 20 MG PO TABS: 40.0000 mg | ORAL_TABLET | Freq: Every day | ORAL | Status: DC

## 2024-02-04 MED ORDER — PREDNISONE 10 MG PO TABS: 10.0000 mg | ORAL_TABLET | Freq: Every day | ORAL | Status: DC

## 2024-02-04 NOTE — Plan of Care (Signed)
  Problem: Clinical Measurements: Goal: Will remain free from infection Outcome: Progressing   Problem: Nutrition: Goal: Adequate nutrition will be maintained Outcome: Progressing   Problem: Elimination: Goal: Will not experience complications related to bowel motility Outcome: Progressing Goal: Will not experience complications related to urinary retention Outcome: Progressing   Problem: Safety: Goal: Ability to remain free from injury will improve Outcome: Progressing   Problem: Skin Integrity: Goal: Risk for impaired skin integrity will decrease Outcome: Progressing

## 2024-02-04 NOTE — Discharge Summary (Signed)
 Physician Discharge Summary   Patient: Jason Farmer MRN: [id number] DOB: 09-Nov-1934  Admit date:     02/02/2024  Discharge date: 02/04/24  Discharge Physician: Jodeane Mulligan   PCP: Wayne Haines, MD   Recommendations at discharge:    Pt to be discharged to [facility].   If you experience worsening fever, chills, chest pain, shortness of breath, or other concerning symptoms, please call your PCP or go to the emergency department immediately.  Discharge Diagnoses: Principal Problem:   Interstitial lung disease (HCC) Active Problems:   Dysphagia   Community acquired pneumonia   A-fib (HCC)  Resolved Problems:   * No resolved hospital problems. *   Hospital Course:  89 year old male, remote former smoker with history of GERD who was sent to the hospital yesterday for low oxygen levels with ambulation. He reports progressive dyspnea over the past month. He was treated for pneumonia with doxycycline  with no improvement in his symptoms. He reports chronic cough for decades with sputum production on a daily basis. He quit smoking 40 years ago, but smoked for 30 years multiple packs per day. He has not seen a lung doctor before. He is living at a retirement home as his wife's health has recently declined needing more support.    Assessment and Plan:   Acute on likely chronic hypoxic respiratory failure - Initially requiring 2 L nasal cannula to maintain O2 sats greater than 90%.  No previous history of supplemental oxygen use.  Possibility that patient may require home O2 given ILD.  Will set patient up with home O2 prior to discharge.   Acute exacerbation of interstitial lung disease/pulmonary fibrosis - CT angio negative for pulmonary emboli but noting peripheral for fibrotic changes.  Pulmonology/PCCM consulted and following closely.  No fevers, purulent sputum, worsening cough to suggest worsening infection.  Discontinued empiric antibiotics.  Received IV Solu-Medrol , scheduled  nebulizers, supplemental O2 while inpatient.  Will transition patient to prednisone  taper per pulmonology recommendations.  Patient to have home O2 established as well.  Will follow-up with ILD clinic in the outpatient setting.   Atrial fibrillation - Initiated on amiodarone  09/2023.  Holding in the setting of worsening ILD above.  Continue metoprolol , Pradaxa .  Would recommend patient follow with cardiology in the outpatient setting to reestablish antirhythm medications if warranted.   Suspected Lewy body dementia - Continue home memantine , Seroquel .  Patient appears to be at baseline, pleasant.   CKD 3A - Creatinine appears to be at baseline.     Physical debilitation and muscle weakness - Evaluated by PT/OT.  Likely exacerbated by oxygen use as well.  Recommending SNF.  Patient to be transition to [facility].   Consultants: Pulmonology Procedures performed: None Disposition: Skilled nursing facility Diet recommendation:  Discharge Diet Orders (From admission, onward)     Start     Ordered   02/04/24 0000  Diet - low sodium heart healthy        02/04/24 1336           Cardiac diet  DISCHARGE MEDICATION: Allergies as of 02/04/2024       Reactions   Statins Other (See Comments)   Muscle cramps/pain   Aspirin  Other (See Comments)   Bleeding   Atorvastatin Other (See Comments)   Muscle pain        Medication List     STOP taking these medications    amiodarone  200 MG tablet Commonly known as: PACERONE    cefdinir 300 MG capsule Commonly  known as: OMNICEF   doxycycline  100 MG capsule Commonly known as: VIBRAMYCIN    fluticasone  50 MCG/ACT nasal spray Commonly known as: FLONASE    sodium chloride  0.65 % nasal spray Commonly known as: OCEAN       TAKE these medications    acetaminophen  325 MG tablet Commonly known as: TYLENOL  Take 325 mg by mouth every 6 (six) hours as needed for mild pain (pain score 1-3), moderate pain (pain score 4-6) or headache.    albuterol  108 (90 Base) MCG/ACT inhaler Commonly known as: VENTOLIN  HFA Inhale 2 puffs into the lungs every 6 (six) hours as needed for wheezing or shortness of breath.   dabigatran  150 MG Caps capsule Commonly known as: PRADAXA  Take 1 capsule (150 mg total) by mouth 2 (two) times daily.   escitalopram  10 MG tablet Commonly known as: LEXAPRO  Take 1 tablet (10 mg total) by mouth daily. What changed: when to take this   ezetimibe  10 MG tablet Commonly known as: ZETIA  TAKE ONE TABLET BY MOUTH IN THE EVENING   fexofenadine  180 MG tablet Commonly known as: ALLEGRA  Take 1 tablet (180 mg total) by mouth daily.   meclizine  12.5 MG tablet Commonly known as: ANTIVERT  Take 1 tablet (12.5 mg total) by mouth 3 (three) times daily as needed for dizziness.   memantine  10 MG tablet Commonly known as: NAMENDA  Take 1 tablet (10 mg total) by mouth 2 (two) times daily.   memantine  5 MG tablet Commonly known as: NAMENDA  Take 5 mg by mouth in the morning and at bedtime.   metoprolol  tartrate 50 MG tablet Commonly known as: LOPRESSOR  Take 1 tablet (50 mg total) by mouth 2 (two) times daily. What changed: when to take this   mirtazapine  15 MG tablet Commonly known as: REMERON  Take 1 tablet (15 mg total) by mouth every evening.   omeprazole  20 MG capsule Commonly known as: PRILOSEC Take 1 capsule (20 mg total) by mouth every morning.   polyethylene glycol 17 g packet Commonly known as: MIRALAX  / GLYCOLAX  Take 17 g by mouth daily.   predniSONE  5 MG tablet Commonly known as: DELTASONE  Take 8 tablets (40 mg total) by mouth daily with breakfast for 4 days, THEN 6 tablets (30 mg total) daily with breakfast for 4 days, THEN 4 tablets (20 mg total) daily with breakfast for 4 days, THEN 2 tablets (10 mg total) daily with breakfast for 4 days, THEN 1 tablet (5 mg total) daily with breakfast for 4 days. Start taking on: February 05, 2024 What changed:  medication strength See the new  instructions.   QUEtiapine  25 MG tablet Commonly known as: SEROQUEL  Take 1 tablet (25 mg total) by mouth at bedtime.               Durable Medical Equipment  (From admission, onward)           Start     Ordered   02/04/24 0000  For home use only DME oxygen       Question Answer Comment  Length of Need 12 Months   Mode or (Route) Nasal cannula   Liters per Minute 2   Frequency Continuous (stationary and portable oxygen unit needed)   Oxygen conserving device Yes   Oxygen delivery system Gas      02/04/24 1337             Discharge Exam: Filed Weights   02/02/24 2211  Weight: 63.5 kg    GENERAL:  Alert, pleasant, no acute  distress  HEENT:  EOMI CARDIOVASCULAR:  RRR, no murmurs appreciated RESPIRATORY:  Clear to auscultation, no wheezing, rales, or rhonchi GASTROINTESTINAL:  Soft, nontender, nondistended EXTREMITIES:  No LE edema bilaterally NEURO:  No new focal deficits appreciated SKIN:  No rashes noted PSYCH:  Appropriate mood and affect     Condition at discharge: improving  The results of significant diagnostics from this hospitalization (including imaging, microbiology, ancillary and laboratory) are listed below for reference.   Imaging Studies: DG Chest 2 View Result Date: 02/02/2024 CLINICAL DATA:  Cough, shortness of breath EXAM: CHEST - 2 VIEW COMPARISON:  01/26/2024 FINDINGS: Severe fibrosis noted throughout the lungs, left greater than right. This is not significantly changed since prior study. No definite acute process. Heart and mediastinal contours within normal limits. No effusions. IMPRESSION: Severe fibrotic changes.  No definite acute process. Electronically Signed   By: Janeece Mechanic M.D.   On: 02/02/2024 17:41   CT Angio Chest PE W/Cm &/Or Wo Cm Result Date: 01/26/2024 CLINICAL DATA:  High probability for PE.  Shortness of breath. EXAM: CT ANGIOGRAPHY CHEST WITH CONTRAST TECHNIQUE: Multidetector CT imaging of the chest was performed  using the standard protocol during bolus administration of intravenous contrast. Multiplanar CT image reconstructions and MIPs were obtained to evaluate the vascular anatomy. RADIATION DOSE REDUCTION: This exam was performed according to the departmental dose-optimization program which includes automated exposure control, adjustment of the mA and/or kV according to patient size and/or use of iterative reconstruction technique. CONTRAST:  75mL OMNIPAQUE  IOHEXOL  350 MG/ML SOLN COMPARISON:  CT of the chest 08/01/2022. FINDINGS: Cardiovascular: Satisfactory opacification of the pulmonary arteries to the segmental level. No evidence of pulmonary embolism. The heart is mildly enlarged. No pericardial effusion. Mediastinum/Nodes: No enlarged mediastinal, hilar, or axillary lymph nodes. Thyroid  gland, trachea, and esophagus demonstrate no significant findings. Lungs/Pleura: Peripheral fibrotic changes have significantly increased, particularly in the left upper lobe and left lower lobe. Bullae are again noted peripherally. There is some superimposed ground-glass opacities in the left lower lobe and minimally left upper lobe. There is no consolidation, pneumothorax or pleural effusion. Nodular scarring in the right lung apex measures up to 10 mm, unchanged from prior. Upper Abdomen: No acute abnormality. Musculoskeletal: Cervical spinal fusion plate is present. There are degenerative changes of the spine. No acute fractures are identified. Review of the MIP images confirms the above findings. IMPRESSION: 1. No evidence for pulmonary embolism. 2. Significant increase in peripheral fibrotic changes, particularly in the left upper lobe and left lower lobe. 3. Superimposed ground-glass opacities in the left lower lobe and minimally left upper lobe may be infectious/inflammatory. 4. Stable 10 mm nodular scarring in the right lung apex. Electronically Signed   By: Tyron Gallon M.D.   On: 01/26/2024 16:58   DG Chest 2  View Result Date: 01/26/2024 CLINICAL DATA:  Productive cough for 1 week.  Bloody sputum. EXAM: CHEST - 2 VIEW COMPARISON:  September 26, 2023. FINDINGS: The heart size and mediastinal contours are within normal limits. Grossly stable reticular densities are noted throughout both lungs most consistent with chronic interstitial lung disease or fibrosis. No definite acute abnormality seen. The visualized skeletal structures are unremarkable. IMPRESSION: Stable chronic findings as noted above. Electronically Signed   By: Rosalene Colon M.D.   On: 01/26/2024 15:14    Microbiology: Results for orders placed or performed during the hospital encounter of 02/02/24  Culture, blood (routine x 2)     Status: None (Preliminary result)   Collection  Time: 02/02/24  4:47 PM   Specimen: BLOOD RIGHT ARM  Result Value Ref Range Status   Specimen Description   Final    BLOOD RIGHT ARM Performed at [facility], [address], [place], [place] [address]    Special Requests   Final    BOTTLES DRAWN AEROBIC AND ANAEROBIC Blood Culture adequate volume Performed at [facility], [address], [place], [place] [address]    Culture   Final    NO GROWTH 2 DAYS Performed at [facility], [address], [place], [place] [address]    Report Status PENDING  Incomplete  MRSA Next Gen by PCR, Nasal     Status: None   Collection Time: 02/02/24  8:54 PM   Specimen: Nasopharyngeal Swab; Nasal Swab  Result Value Ref Range Status   MRSA by PCR Next Gen NOT DETECTED NOT DETECTED Final    Comment: (NOTE) The GeneXpert MRSA Assay (FDA approved for NASAL specimens only), is one component of a comprehensive MRSA colonization surveillance program. It is not intended to diagnose MRSA infection nor to guide or monitor treatment for MRSA infections. Test performance is not FDA approved in patients less than 19 years old. Performed at [facility], [address] [place], [place] [address]   Respiratory (~20 pathogens) panel by PCR     Status: None   Collection Time: 02/02/24  8:55 PM   Specimen: Nasopharyngeal Swab; Respiratory  Result Value Ref Range Status   Adenovirus NOT DETECTED NOT DETECTED Final   Coronavirus 229E NOT DETECTED NOT DETECTED Final    Comment: (NOTE) The Coronavirus on the Respiratory Panel, DOES NOT test for the novel  Coronavirus (2019 nCoV)    Coronavirus HKU1 NOT DETECTED NOT DETECTED Final   Coronavirus NL63 NOT DETECTED NOT DETECTED Final   Coronavirus OC43 NOT DETECTED NOT DETECTED Final   Metapneumovirus NOT DETECTED NOT DETECTED Final   Rhinovirus / Enterovirus NOT DETECTED NOT DETECTED Final   Influenza A NOT DETECTED NOT DETECTED Final   Influenza B NOT DETECTED NOT DETECTED Final   Parainfluenza Virus 1 NOT DETECTED NOT DETECTED Final   Parainfluenza Virus 2 NOT DETECTED NOT DETECTED Final   Parainfluenza Virus 3 NOT DETECTED NOT DETECTED Final   Parainfluenza Virus 4 NOT DETECTED NOT DETECTED Final   Respiratory Syncytial Virus NOT DETECTED NOT DETECTED Final   Bordetella pertussis NOT DETECTED NOT DETECTED Final   Bordetella Parapertussis NOT DETECTED NOT DETECTED Final   Chlamydophila pneumoniae NOT DETECTED NOT DETECTED Final   Mycoplasma pneumoniae NOT DETECTED NOT DETECTED Final    Comment: Performed at [facility], [address], [place], [place] [address]    Labs: CBC: Recent Labs  Lab 02/02/24 1647 02/03/24 0539 02/04/24 0542  WBC 7.9 6.0 18.7*  NEUTROABS 4.9  --   --   HGB 16.2 14.1 13.9  HCT 51.1 44.3 42.7  MCV 92.6 93.5 92.2  PLT 418* 392 456*   Basic Metabolic Panel: Recent Labs  Lab 02/02/24 1647 02/03/24 0539 02/04/24 0542  NA 136 133* 134*  K 4.3 5.1 4.8  CL 101 104 104  CO2 22 22 22   GLUCOSE 97 148* 146*  BUN 27* 27* 35*  CREATININE 1.44* 1.40* 1.63*  CALCIUM  9.5 8.8* 9.2  MG  --  2.6* 2.4  PHOS  --  3.3  --    Liver Function Tests: Recent Labs  Lab  02/02/24 1647  AST 27  ALT 25  ALKPHOS 99  BILITOT 0.8  PROT 8.0  ALBUMIN 3.7   CBG: No results for input(s): GLUCAP in the last 168 hours.  Discharge time spent: 38 minutes.  Length of inpatient stay: 2 days  Signed: Jodeane Mulligan, DO [facility] 02/04/2024

## 2024-02-04 NOTE — Progress Notes (Signed)
 NAME:  Jason Farmer, MRN:  [id number], DOB:  [birth year], LOS: 2 ADMISSION DATE:  02/02/2024, CONSULTATION DATE:  02/03/2024 REFERRING MD:  TR[facility] CHIEF COMPLAINT:  ILD   History of Present Illness:  Jason Farmer is an 89 year old male, remote former smoker with history of GERD who was sent to the hospital yesterday for low oxygen levels with ambulation. He reports progressive dyspnea over the past month. He was treated for pneumonia with doxycycline  with no improvement in his symptoms. He reports chronic cough for decades with sputum production on a daily basis. He quit smoking 40 years ago, but smoked for 30 years multiple packs per day. He has not seen a lung doctor before. He is living at a retirement home as his wife's health has recently declined needing more support.   CTA PE study is negative for pulmonary emboli. He has peripheral fibrotic changes with craniocaudal gradient. Compared to 2023, increase in left upper lobe fibrosis. Emphysematous changes as well.   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 Denies significant dust or chemical exposures but reports hobby of wood working.   Pertinent  Medical History   Past Medical History:  Diagnosis Date   Allergy    Anxiety    not a problem for a long time (11/27/2016)   Arthritis    wrists, knees, shoulders, back (11/27/2016)   Chronic lower back pain    5th disc is protruding (11/27/2016)   Essential tremor    GERD (gastroesophageal reflux disease)    Glaucoma    Heart murmur    dx'd in [employer] in the 1960s; haven't been seen for it since (11/27/2016)   Hepatitis ~ 1958   jaundice kind   HOH (hard of hearing)    Hyperlipemia    Migraine    none since my neck OR (11/27/2016)   Seasonal allergies    TIA (transient ischemic attack) 04/2015   Wears glasses    Wears hearing aid      Significant Hospital Events: Including procedures, antibiotic start and stop dates in addition to other pertinent events   6/10 admitted for respiratory  failure  Interim History / Subjective:   No acute events overnight  Patient desaturates with ambulation, required 3L continuous flow per PT with ambulation  Daughter at bedside  Objective    Blood pressure (!) 149/68, pulse 63, temperature 97.9 F (36.6 C), temperature source Oral, resp. rate 18, height 5' 8 (1.727 m), weight 63.5 kg, SpO2 97%.       No intake or output data in the 24 hours ending 02/04/24 1019  Filed Weights   02/02/24 2211  Weight: 63.5 kg    Examination: General: elderly male, no acute distress HENT: [place]/AT, moist mucous membranes Lungs: crackles scattered throughout, no wheezing Cardiovascular: rrr, no murmurs Abdomen: soft, non-tender, non-distended Extremities: warm, no edema Neuro: alert, oriented x 3 GU: n/a    SATURATION QUALIFICATIONS: (This note is used to comply with regulatory documentation for home oxygen)   Patient Saturations on Room Air at Rest = 93%   Patient Saturations on Room Air while Ambulating = 85%   Patient Saturations on 3 Liters of oxygen while Ambulating = 90%      Resolved problem list   Assessment and Plan  Acute Hypoxemic Respiratory Failure Pulmonary Fibrosis Possible Pneumonia vs ILD Flare vs Amiodarone  Toxicity Emphysema Paroxysmal Atrial Fibrillation  Plan: - Recommend discharge with home oxygen to use 3L with ambulation and 2L at night when sleeping - At rest,  he is ok on room air - Will perform nocturnal oximetry testing as outpatient and also portable oxygen concentrator evaluation as outpatient - will transition to oral prednisone  taper from IV steroids - Autoimmune panel is pending - Less concern for infection, can stop antibiotics - Amiodarone  has been discontinued - Recommend order for physical therapy be sent to the [facility] facility where he resides. Will arrange Pulmonary Rehab once seen in clinic. - discussed pulmonary fibrosis diagnosis in detail and will need follow up in clinic to  discuss starting therapy with nintedanib (Ofev) and pirfenidone (Esbriet) - referred him and his family to the pulmonary fibrosis foundation website for more info  PCCM will sign off.  Best Practice (right click and Reselect all SmartList Selections daily)   Per Primary  Labs   CBC: Recent Labs  Lab 02/02/24 1647 02/03/24 0539 02/04/24 0542  WBC 7.9 6.0 18.7*  NEUTROABS 4.9  --   --   HGB 16.2 14.1 13.9  HCT 51.1 44.3 42.7  MCV 92.6 93.5 92.2  PLT 418* 392 456*    Basic Metabolic Panel: Recent Labs  Lab 02/02/24 1647 02/03/24 0539 02/04/24 0542  NA 136 133* 134*  K 4.3 5.1 4.8  CL 101 104 104  CO2 22 22 22   GLUCOSE 97 148* 146*  BUN 27* 27* 35*  CREATININE 1.44* 1.40* 1.63*  CALCIUM  9.5 8.8* 9.2  MG  --  2.6* 2.4  PHOS  --  3.3  --    GFR: Estimated Creatinine Clearance: 29.2 mL/min (A) (by C-G formula based on SCr of 1.63 mg/dL (H)). Recent Labs  Lab 02/02/24 1647 02/02/24 1654 02/03/24 0539 02/04/24 0542  PROCALCITON  --   --  <0.10  --   WBC 7.9  --  6.0 18.7*  LATICACIDVEN  --  1.6  --   --     Liver Function Tests: Recent Labs  Lab 02/02/24 1647  AST 27  ALT 25  ALKPHOS 99  BILITOT 0.8  PROT 8.0  ALBUMIN 3.7   No results for input(s): LIPASE, AMYLASE in the last 168 hours. No results for input(s): AMMONIA in the last 168 hours.  ABG    Component Value Date/Time   PHART 7.41 02/02/2024 1609   PCO2ART 38 02/02/2024 1609   PO2ART 79 (L) 02/02/2024 1609   HCO3 24.1 02/02/2024 1609   ACIDBASEDEF 0.3 02/02/2024 1609   O2SAT 98.5 02/02/2024 1609     Coagulation Profile: No results for input(s): INR, PROTIME in the last 168 hours.  Cardiac Enzymes: Recent Labs  Lab 02/03/24 1241  CKTOTAL 19*    HbA1C: No results found for: HGBA1C  CBG: No results for input(s): GLUCAP in the last 168 hours.     Critical care time: n/a    Duaine German, MD [facility] Office: [phone]   See  Amion for personal pager PCCM on call pager [phone] until 7pm. Please call Elink 7p-7a. [phone]

## 2024-02-04 NOTE — Telephone Encounter (Signed)
 Please schedule patient for hospital follow up for pulmonary fibrosis in 2-3 weeks with me. Can use a blocked slot if needed.  Thanks, JD

## 2024-02-04 NOTE — Plan of Care (Signed)

## 2024-02-04 NOTE — Progress Notes (Signed)
 Progress Note   Patient: Jason Farmer WGN:[id number] DOB: 23-Jul-1935 DOA: 02/02/2024  DOS: the patient was seen and examined on 02/04/2024   Brief hospital course:  89 year old male, remote former smoker with history of GERD who was sent to the hospital yesterday for low oxygen levels with ambulation. He reports progressive dyspnea over the past month. He was treated for pneumonia with doxycycline  with no improvement in his symptoms. He reports chronic cough for decades with sputum production on a daily basis. He quit smoking 40 years ago, but smoked for 30 years multiple packs per day. He has not seen a lung doctor before. He is living at a retirement home as his wife's health has recently declined needing more support.    Assessment and Plan:   Acute hypoxic respiratory failure - Initially requiring 2 L nasal cannula to maintain O2 sats greater than 90%.  No previous history of supplemental oxygen use.  Possibility that patient may require home O2.  Appears to be slowly improving.  Will attempt to wean O2 as tolerated.   Acute exacerbation of interstitial lung disease/pulmonary fibrosis - CT angio negative for pulmonary emboli but noting peripheral for fibrotic changes.  Pulmonology/PCCM consulted and following closely.  No fevers, purulent sputum, worsening cough to suggest worsening infection.  Discontinued vancomycin, continues on empiric cefepime.  Will continue IV Solu-Medrol , scheduled nebulizers, supplemental O2 as above.   Atrial fibrillation - Initiated on amiodarone  09/2023.  Holding in the setting of worsening ILD above.  Continue metoprolol , Pradaxa .   Suspected Lewy body dementia - Continue home memantine , Seroquel .  Patient appears to be at baseline, pleasant.   CKD 3A - Creatinine appears to be at baseline.  Will recheck BMP in AM.   Goals of care - Anticipate discharge tomorrow  Subjective: Patient showing improvement this morning.  Worked well with PT yesterday.   Attempting to wean off O2.  Denies any worsening fever, chills, chest pain, nausea, vomiting, abdominal pain.  Some concern over memory issues and admits he did not sleep well last night.  Physical Exam:  Vitals:   02/03/24 1928 02/03/24 2114 02/04/24 0524 02/04/24 0801  BP: (!) 140/67  (!) 149/68   Pulse: 67  63   Resp: 18  18   Temp: 98 F (36.7 C)  97.9 F (36.6 C)   TempSrc: Oral  Oral   SpO2: 94% 92% 96% 97%  Weight:      Height:        GENERAL:  Alert, pleasant, no acute distress  HEENT:  EOMI, nasal cannula CARDIOVASCULAR:  RRR, no murmurs appreciated RESPIRATORY: Poor air movement, trace diffuse crackles  GASTROINTESTINAL:  Soft, nontender, nondistended EXTREMITIES:  No LE edema bilaterally NEURO:  No new focal deficits appreciated SKIN:  No rashes noted PSYCH:  Appropriate mood and affect    Data Reviewed:  No new imaging to review  Previous records (including but not limited to H&P, progress notes, nursing notes, TOC management) were reviewed in assessment of this patient.  Labs: CBC: Recent Labs  Lab 02/02/24 1647 02/03/24 0539 02/04/24 0542  WBC 7.9 6.0 18.7*  NEUTROABS 4.9  --   --   HGB 16.2 14.1 13.9  HCT 51.1 44.3 42.7  MCV 92.6 93.5 92.2  PLT 418* 392 456*   Basic Metabolic Panel: Recent Labs  Lab 02/02/24 1647 02/03/24 0539 02/04/24 0542  NA 136 133* 134*  K 4.3 5.1 4.8  CL 101 104 104  CO2 22 22 22  GLUCOSE 97 148* 146*  BUN 27* 27* 35*  CREATININE 1.44* 1.40* 1.63*  CALCIUM  9.5 8.8* 9.2  MG  --  2.6* 2.4  PHOS  --  3.3  --    Liver Function Tests: Recent Labs  Lab 02/02/24 1647  AST 27  ALT 25  ALKPHOS 99  BILITOT 0.8  PROT 8.0  ALBUMIN 3.7   CBG: No results for input(s): GLUCAP in the last 168 hours.  Scheduled Meds:  dabigatran   150 mg Oral BID   escitalopram   10 mg Oral QHS   ezetimibe   10 mg Oral QPM   ipratropium-albuterol   3 mL Nebulization BID   memantine   5 mg Oral BID   methylPREDNISolone   (SOLU-MEDROL ) injection  60 mg Intravenous Q12H   metoprolol  tartrate  50 mg Oral BID   mirtazapine   7.5 mg Oral QHS   pantoprazole   40 mg Oral Daily   sodium chloride  flush  3 mL Intravenous Q12H   Continuous Infusions:  ceFEPime (MAXIPIME) IV Stopped (02/04/24 1142)   PRN Meds:.acetaminophen , albuterol , melatonin, polyethylene glycol  Family Communication: Family at bedside  Disposition: Status is: Inpatient Remains inpatient appropriate because: ILD exacerbation     Time spent: 35 minutes  Length of inpatient stay: 2 days  Author: Jodeane Mulligan, DO 02/04/2024 11:53 AM  For on call review [email or web address].

## 2024-02-04 NOTE — Progress Notes (Signed)
 Mobility Specialist - Progress Note   02/04/24 1416  Oxygen Therapy  SpO2 (!) 83 %  O2 Device Room Air  Patient Activity (if Appropriate) Ambulating  Mobility  Activity Ambulated with assistance in hallway  Level of Assistance Standby assist, set-up cues, supervision of patient - no hands on  Assistive Device Front wheel walker  Distance Ambulated (ft) 500 ft  Activity Response Tolerated well  Mobility Referral Yes  Mobility visit 1 Mobility  Mobility Specialist Start Time (ACUTE ONLY) 1324  Mobility Specialist Stop Time (ACUTE ONLY) 1342  Mobility Specialist Time Calculation (min) (ACUTE ONLY) 18 min   Nurse requested Mobility Specialist to perform oxygen saturation test with pt which includes removing pt from oxygen both at rest and while ambulating.  Below are the results from that testing.     Patient Saturations on Room Air at Rest = spO2 90%  Patient Saturations on Room Air while Ambulating = sp02 83% .   Patient Saturations on 2 Liters of oxygen while Ambulating = sp02 87-88% Patient Saturations on 3 Liters of oxygen while Ambulating = sp02 93%  At end of testing pt left in room on 2  Liters of oxygen.  Reported results to nurse. Pt received in bed and agreeable to mobility. C/o SOB tghroughout session. No other complaints during session. Pt to recliner after session with all needs met.    Pre-mobility: 90% SpO2 (RA) During mobility: 83% SpO2 (RA) Post-mobility: 93% SPO2 (3L [place])  During mobility: 93% SpO2 (3L [place])   Chief Technology Officer

## 2024-02-04 NOTE — TOC Transition Note (Addendum)
 Transition of Care [facility]) - Discharge Note   Patient Details  Name: Jason Farmer MRN: [id number] Date of Birth: 08/13/1935  [facility] CM/SW Contact:  Marty Sleet, LCSW Phone Number: 02/04/2024, 1:54 PM   Clinical Narrative:    Pt agreeable to SNF. Pt able to transfer to [facility] for SNF today. Pt going under [facility] waiver. Pt will be going to room 611P. RN to call report to [phone]. DC packet placed at RN station. Due to pt having new O2 requirement pt will need to be transported via EMS. [facility] called at 1:50pm for transportation.   Final next level of care: Skilled Nursing Facility Barriers to Discharge: Barriers Resolved   Patient Goals and CMS Choice Patient states their goals for this hospitalization and ongoing recovery are:: To return to [place] at [facility].gov Compare Post Acute Care list provided to:: Patient Choice offered to / list presented to : Patient      Discharge Placement   Existing PASRR number confirmed : 02/03/24          Patient chooses bed at: [facility] Patient to be transferred to facility by: [facility] Name of family member notified: Patient and granddaughter Patient and family notified of of transfer: 02/04/24  Discharge Plan and Services Additional resources added to the After Visit Summary for   In-house Referral: Clinical Social Work Discharge Planning Services: NA Post Acute Care Choice: Home Health, Skilled Nursing Facility          DME Arranged: N/A DME Agency: NA                  Social Drivers of Health (SDOH) Interventions SDOH Screenings   Food Insecurity: No Food Insecurity (02/02/2024)  Housing: Low Risk  (02/02/2024)  Transportation Needs: No Transportation Needs (02/02/2024)  Utilities: Not At Risk (02/02/2024)  Alcohol Screen: Low Risk  (03/23/2023)  Depression (PHQ2-9): Low Risk  (03/23/2023)  Financial Resource Strain: Low Risk  (03/23/2023)  Physical Activity: Insufficiently Active  (03/23/2023)  Social Connections: Moderately Isolated (02/02/2024)  Stress: No Stress Concern Present (03/23/2023)  Tobacco Use: Medium Risk (02/02/2024)  Health Literacy: Adequate Health Literacy (03/23/2023)     Readmission Risk Interventions    02/03/2024    1:40 PM  Readmission Risk Prevention Plan  Transportation Screening Complete  PCP or Specialist Appt within 3-5 Days Complete  HRI or Home Care Consult Complete  Social Work Consult for Recovery Care Planning/Counseling Complete  Palliative Care Screening Not Applicable  Medication Review Oceanographer) Complete

## 2024-02-04 NOTE — Progress Notes (Signed)
 Second attempt to call report. Left VM Angel [phone]) to call back.

## 2024-02-04 NOTE — Progress Notes (Signed)
 Clinical swallow evaluation completed and full report to follow.  Patient with negative cranial nerve deficits and reports that he has sensation of food and pills sticking at times in his lower oropharynx that requires him to cough to clear or wash down with liquids.  Patient states when he does send choking or coughing when he is eating with his friends he will go outside to clear.  At times he expectorates food that is accompanied by secretions. He reports after his most recent endoscopy his GI MD advised he was not able to dilate him again due to a growth.  Per chart review from Dr. Stoney Elks endoscopy July 2024 patient did have a hiatal hernia and his GE was dilated to 15, 16, 17 mm with mild resistance.  He is on a reflux medication chronically and does endorse some occasional issues with breakthrough reflux.  He takes large pills cut in half with liquids.    Suspect patient's sensation to retention in pharynx is at the lower esophageal region given prior endoscopy findings.  Patient admits that his dysphagia and gustatory changes are impacting his intake in a negative manner.  SLP advised him to precautions and mitigation strategies including starting oral intake with liquids to assure adequate moisture to help propel food through oropharynx.  Masticate food thoroughly and try warm liquids with meals if helpful.  He does drink 2 glasses of water he reports during the meal to help him clear food.  He may benefit also from consuming several small meals a day especially given taste deficit.  Patient observed consuming 3 ounces of water for the Yale swallow screen, multiple bites of eggs and single bite of sausage as well as coffee.  No clinical signs and symptoms of aspiration or report of sensation of retention.  Jason Farmer, his granddaughter, present and reports patient doing better eating in the hospital possibly due to improved ability to focus. Encouraged patient to follow-up with Dr. Sandrea Cruel his primary GI  MD.  Thanks for this referral, no SLP follow-up indicated.  Jason Sorrow, Jason Farmer [facility] SLP Acute [employer] [phone]

## 2024-02-04 NOTE — Telephone Encounter (Signed)
 Patient scheduled. Had to use blocked slot.

## 2024-02-04 NOTE — Evaluation (Signed)
 Clinical/Bedside Swallow Evaluation Patient Details  Name: FRANCISCOJAVIER WRONSKI MRN: [id number] Date of Birth: 01-22-1935  Today's Date: 02/04/2024 Time: SLP Start Time (ACUTE ONLY): 4098 SLP Stop Time (ACUTE ONLY): 0910 SLP Time Calculation (min) (ACUTE ONLY): 35 min  Past Medical History:  Past Medical History:  Diagnosis Date   Allergy    Anxiety    not a problem for a long time (11/27/2016)   Arthritis    wrists, knees, shoulders, back (11/27/2016)   Chronic lower back pain    5th disc is protruding (11/27/2016)   Essential tremor    GERD (gastroesophageal reflux disease)    Glaucoma    Heart murmur    dx'd in [employer] in the 1960s; haven't been seen for it since (11/27/2016)   Hepatitis ~ 1958   jaundice kind   HOH (hard of hearing)    Hyperlipemia    Migraine    none since my neck OR (11/27/2016)   Seasonal allergies    TIA (transient ischemic attack) 04/2015   Wears glasses    Wears hearing aid    Past Surgical History:  Past Surgical History:  Procedure Laterality Date   ANTERIOR CERVICAL DECOMP/DISCECTOMY FUSION  2007   APPENDECTOMY     BACK SURGERY     CARPAL TUNNEL RELEASE Right 10/12/2013   Procedure: RIGHT CARPAL TUNNEL RELEASE;  Surgeon: Kemp Patter, MD;  Location: [facility];  Service: Orthopedics;  Laterality: Right;   CATARACT EXTRACTION W/ INTRAOCULAR LENS  IMPLANT, BILATERAL Bilateral    COLONOSCOPY     HEMORRHOID SURGERY     KNEE ARTHROSCOPY Left 1970s   NASAL SEPTOPLASTY W/ TURBINOPLASTY Bilateral 02/15/2013   Procedure: NASAL SEPTOPLASTY WITH BILATER TURBINATE REDUCTION;  Surgeon: Lawence Press, MD;  Location: [facility];  Service: ENT;  Laterality: Bilateral;   TONSILLECTOMY     TRIGGER FINGER RELEASE  06/08/2012   Procedure: RELEASE TRIGGER FINGER/A-1 PULLEY;  Surgeon: Kemp Patter, MD;  Location: [facility];  Service: Orthopedics;  Laterality: Left;  Release A-1 Pulley Left Long Finger   TRIGGER FINGER RELEASE   07/14/2012   Procedure: RELEASE TRIGGER FINGER/A-1 PULLEY;  Surgeon: Kemp Patter, MD;  Location: [facility];  Service: Orthopedics;  Laterality: Right;   TRIGGER FINGER RELEASE Right 10/12/2013   Procedure: RELEASE TRIGGER FINGER/A-1 PULLEY RIGHT MIDDLE FINGER;  Surgeon: Kemp Patter, MD;  Location: [facility];  Service: Orthopedics;  Laterality: Right;   UPPER GASTROINTESTINAL ENDOSCOPY     HPI:  pt is an 89 yo male adm to [facility] w/resp difficulties. Swallow eval ordered. PMH + Pt  dysphagia s/p dilatation, AMS, COPD.  CT scan pulmonary fibrosis and also ground-glass haziness LU and LL lobes. Seen as OP for memory deficits - 2/25 and 3/25 Cog deficit.  CT MRI - DAT negative for lewy body dementia per PA report to family .  Family reports pt with progressive memory deficits, unable to recall daughter name.    Multiple dilatations done - 12/2022 dilation at 17 mm without resistance, 02/2023 dilatation done to 15-16-17 mm.    Assessment / Plan / Recommendation  Clinical Impression  Patient with negative cranial nerve deficits and reports that he has sensation of food and pills sticking at times in his lower oropharynx that requires him to cough to clear or wash down with liquids.  Patient states when he does send choking or coughing when he is eating with his friends he will  go outside to clear.  At times he expectorates food that is accompanied by secretions. He reports after his most recent endoscopy his GI MD advised he was not able to dilate him again due to a growth.  Per chart review from Dr. Stoney Elks endoscopy July 2024 patient did have a hiatal hernia and his GE was dilated to 15, 16, 17 mm with mild resistance.  He is on a reflux medication chronically and does endorse some occasional issues with breakthrough reflux.  He takes large pills cut in half with liquids.       Suspect patient's sensation to retention in pharynx is at the lower esophageal region given prior  endoscopy findings.  Patient admits that his dysphagia and gustatory changes are impacting his intake in a negative manner.  SLP advised him to precautions and mitigation strategies including starting oral intake with liquids to assure adequate moisture to help propel food through oropharynx.  Masticate food thoroughly and try warm liquids with meals if helpful.  He does drink 2 glasses of water he reports during the meal to help him clear food.  He may benefit also from consuming several small meals a day especially given taste deficit.     Patient observed consuming 3 ounces of water for the Yale swallow screen, multiple bites of eggs and single bite of sausage as well as coffee.  No clinical signs and symptoms of aspiration or report of sensation of retention.  Isa Manuel, his granddaughter, present and reports patient doing better eating in the hospital possibly due to improved ability to focus. Encouraged patient to follow-up with Dr. Sandrea Cruel his primary GI MD. SLP Visit Diagnosis: Dysphagia, unspecified (R13.10)    Aspiration Risk  Mild aspiration risk    Diet Recommendation Regular;Thin liquid    Liquid Administration via: Cup;Straw Medication Administration: Whole meds with liquid Supervision: Patient able to self feed Compensations: Small sips/bites;Slow rate;Other (Comment) Postural Changes: Seated upright at 90 degrees;Remain upright for at least 30 minutes after po intake    Other  Recommendations Oral Care Recommendations: Oral care BID     Assistance Recommended at Discharge  N/a  Functional Status Assessment Patient has not had a recent decline in their functional status  Frequency and Duration      N/a      Prognosis    N/a    Swallow Study   General Date of Onset: 02/04/24 HPI: pt is an 89 yo male adm to [facility] w/resp difficulties. Swallow eval ordered. PMH + Pt  dysphagia s/p dilatation, AMS, COPD.  CT scan pulmonary fibrosis and also ground-glass haziness LU and LL lobes. Seen  as OP for memory deficits - 2/25 and 3/25 Cog deficit.  CT MRI - DAT negative for lewy body dementia per PA report to family .  Family reports pt with progressive memory deficits, unable to recall daughter name.    Multiple dilatations done - 12/2022 dilation at 17 mm without resistance, 02/2023 dilatation done to 15-16-17 mm. Type of Study: Bedside Swallow Evaluation Previous Swallow Assessment: Prior endoscopies Diet Prior to this Study: Dysphagia 3 (mechanical soft);Thin liquids (Level 0) Temperature Spikes Noted: No Respiratory Status: Nasal cannula History of Recent Intubation: No Behavior/Cognition: Alert;Cooperative;Pleasant mood Oral Cavity Assessment: Dry Oral Care Completed by SLP: No Oral Cavity - Dentition: Adequate natural dentition Vision: Functional for self-feeding Self-Feeding Abilities: Able to feed self Patient Positioning: Upright in bed Baseline Vocal Quality: Normal Volitional Cough: Strong Volitional Swallow: Able to elicit    Oral/Motor/Sensory Function Overall  Oral Motor/Sensory Function: Within functional limits   Ice Chips Ice chips: Not tested   Thin Liquid Thin Liquid: Within functional limits Presentation: Self Fed    Nectar Thick Nectar Thick Liquid: Not tested   Honey Thick Honey Thick Liquid: Not tested   Puree Puree: Within functional limits   Solid     Solid: Within functional limits      Chantal Comment 02/04/2024,11:06 AM  Maudie Sorrow, MS [facility] SLP Acute Rehab Services Office [phone]

## 2024-02-07 ENCOUNTER — Other Ambulatory Visit (HOSPITAL_COMMUNITY): Payer: Self-pay | Admitting: Physician Assistant

## 2024-02-07 ENCOUNTER — Other Ambulatory Visit: Payer: Self-pay | Admitting: Internal Medicine

## 2024-02-07 LAB — CULTURE, BLOOD (ROUTINE X 2)
Culture: NO GROWTH
Culture: NO GROWTH
Special Requests: ADEQUATE

## 2024-02-08 ENCOUNTER — Other Ambulatory Visit (HOSPITAL_COMMUNITY): Payer: Self-pay | Admitting: Physician Assistant

## 2024-02-08 LAB — ANTINUCLEAR ANTIBODIES, IFA: ANA Ab, IFA: NEGATIVE

## 2024-02-08 LAB — HYPERSENSITIVITY PNEUMONITIS
A. Pullulans Abs: NEGATIVE
A.Fumigatus #1 Abs: NEGATIVE
Micropolyspora faeni, IgG: NEGATIVE
Pigeon Serum Abs: NEGATIVE
Thermoact. Saccharii: NEGATIVE
Thermoactinomyces vulgaris, IgG: NEGATIVE

## 2024-02-09 ENCOUNTER — Encounter: Payer: Self-pay | Admitting: Psychology

## 2024-02-09 ENCOUNTER — Other Ambulatory Visit: Payer: Self-pay | Admitting: *Deleted

## 2024-02-09 NOTE — Patient Outreach (Signed)
 Mr. Mikhail previously admitted to [facility] under [facility] SNF waiver. Will follow for transition plans and potential complex care management needs as benefit of health plan and PCP.   Secure communication sent to [facility] social worker for collaboration.   Will continue to follow.   Nolberto Batty, MSN, RN, BSN [place]  [facility], [facility] RN Post- Acute Care Manager Direct Dial: [phone]

## 2024-02-11 DIAGNOSIS — J189 Pneumonia, unspecified organism: Secondary | ICD-10-CM | POA: Diagnosis not present

## 2024-02-11 DIAGNOSIS — F339 Major depressive disorder, recurrent, unspecified: Secondary | ICD-10-CM | POA: Diagnosis not present

## 2024-02-11 DIAGNOSIS — I4891 Unspecified atrial fibrillation: Secondary | ICD-10-CM | POA: Diagnosis not present

## 2024-02-11 DIAGNOSIS — J849 Interstitial pulmonary disease, unspecified: Secondary | ICD-10-CM | POA: Diagnosis not present

## 2024-02-12 ENCOUNTER — Other Ambulatory Visit: Payer: Self-pay | Admitting: *Deleted

## 2024-02-12 NOTE — Patient Outreach (Signed)
 Post- Acute Care Manager follow up. Mr. Scalzo utilized [facility] SNF waiver for admission.  Collaboration with [facility] social worker. Mr. Kretschmer recently admitted to SNF. Anticipated transition plan is to return to [facility] IL[facility].   Will continue to follow.   Nolberto Batty, MSN, RN, BSN [place]  [facility], [facility] RN Post- Acute Care Manager Direct Dial: [phone]

## 2024-02-18 ENCOUNTER — Encounter: Payer: Self-pay | Admitting: Pulmonary Disease

## 2024-02-18 ENCOUNTER — Ambulatory Visit

## 2024-02-18 ENCOUNTER — Ambulatory Visit (INDEPENDENT_AMBULATORY_CARE_PROVIDER_SITE_OTHER): Admitting: Pulmonary Disease

## 2024-02-18 VITALS — BP 119/82 | HR 52 | Ht 68.0 in | Wt 126.0 lb

## 2024-02-18 DIAGNOSIS — J8410 Pulmonary fibrosis, unspecified: Secondary | ICD-10-CM

## 2024-02-18 DIAGNOSIS — J9611 Chronic respiratory failure with hypoxia: Secondary | ICD-10-CM

## 2024-02-18 DIAGNOSIS — R918 Other nonspecific abnormal finding of lung field: Secondary | ICD-10-CM | POA: Diagnosis not present

## 2024-02-18 LAB — COMPREHENSIVE METABOLIC PANEL WITH GFR
ALT: 78 U/L — ABNORMAL HIGH (ref 0–53)
AST: 31 U/L (ref 0–37)
Albumin: 3.5 g/dL (ref 3.5–5.2)
Alkaline Phosphatase: 58 U/L (ref 39–117)
BUN: 28 mg/dL — ABNORMAL HIGH (ref 6–23)
CO2: 33 meq/L — ABNORMAL HIGH (ref 19–32)
Calcium: 9.3 mg/dL (ref 8.4–10.5)
Chloride: 99 meq/L (ref 96–112)
Creatinine, Ser: 1.32 mg/dL (ref 0.40–1.50)
GFR: 48.73 mL/min — ABNORMAL LOW (ref >60.00)
Glucose, Bld: 99 mg/dL (ref 70–99)
Potassium: 4.3 meq/L (ref 3.5–5.1)
Sodium: 137 meq/L (ref 135–145)
Total Bilirubin: 0.4 mg/dL (ref 0.2–1.2)
Total Protein: 6.3 g/dL (ref 6.0–8.3)

## 2024-02-18 LAB — SEDIMENTATION RATE: Sed Rate: 29 mm/h — ABNORMAL HIGH (ref 0–20)

## 2024-02-18 LAB — CBC WITH DIFFERENTIAL/PLATELET
Basophils Absolute: 0 10*3/uL (ref 0.0–0.1)
Basophils Relative: 0.3 % (ref 0.0–3.0)
Eosinophils Absolute: 0.1 10*3/uL (ref 0.0–0.7)
Eosinophils Relative: 0.9 % (ref 0.0–5.0)
HCT: 43.2 % (ref 39.0–52.0)
Hemoglobin: 14 g/dL (ref 13.0–17.0)
Lymphocytes Relative: 19.2 % (ref 12.0–46.0)
Lymphs Abs: 3 10*3/uL (ref 0.7–4.0)
MCHC: 32.5 g/dL (ref 30.0–36.0)
MCV: 89.6 fl (ref 78.0–100.0)
Monocytes Absolute: 0.9 10*3/uL (ref 0.1–1.0)
Monocytes Relative: 5.6 % (ref 3.0–12.0)
Neutro Abs: 11.4 10*3/uL — ABNORMAL HIGH (ref 1.4–7.7)
Neutrophils Relative %: 74 % (ref 43.0–77.0)
Platelets: 306 10*3/uL (ref 150.0–400.0)
RBC: 4.82 Mil/uL (ref 4.22–5.81)
RDW: 14.7 % (ref 11.5–15.5)
WBC: 15.4 10*3/uL — ABNORMAL HIGH (ref 4.0–10.5)

## 2024-02-18 LAB — C-REACTIVE PROTEIN: CRP: <1 mg/dL (ref 0.5–20.0)

## 2024-02-18 NOTE — Patient Instructions (Addendum)
 Given patient's complaint of wooziness recommend decreasing his metoprolol  dose to 12.5mg  twice daily or stopping the medicine. His HR on exam today was 52.   Ensure that his Oxygen levels are 88% or higher  We will check an overnight oxygen test on 3L to ensure you are getting enough oxygen at night  We will check labs today  We will check a chest x-ray  Continue steroid taper as planned.  Follow up in 1 month

## 2024-02-18 NOTE — Progress Notes (Signed)
 Synopsis: Referred in June 2025 for hospital follow up for pulmonary fibrosis  Subjective:   PATIENT ID: Jason Farmer GENDER: male DOB: [birth year], MRN: [id number]   HPI  Chief Complaint  Patient presents with   Follow-up    Pt states while not wearing O2 for small amount of time ( getting dressed etc ) light headed    Jason Farmer is an 89 year old male, remote former smoker with history of GERD and atrial fibrillation who returns to pulmonary clinic for hospital follow up.  He was admitted 6/10 to 6/12 for respiratory failure found to have pulmonary fibrosis. He was sent home on 3L of oxygen and a steroid taper for concern of possible ILD flare. Autoimmune testing was negative. He has history of owning an HVAC business and reports very little dust/chemical exposure. He reports chronic cough for decades with sputum production on a daily basis. He quit smoking 40 years ago, but smoked for 30 years multiple packs per day. He has not seen a lung doctor before. He is living at a retirement home as his wife's health has recently declined needing more support.    CTA PE study is negative for pulmonary emboli. He has peripheral fibrotic changes with craniocaudal gradient. Compared to 2023, increase in left upper lobe fibrosis. Emphysematous changes as well.   He was sent home on supplemental oxygen. O2 from hospital below.  SATURATION QUALIFICATIONS: (This note is used to comply with regulatory documentation for home oxygen)   Patient Saturations on Room Air at Rest = 93%   Patient Saturations on Room Air while Ambulating = 85%   Patient Saturations on 3 Liters of oxygen while Ambulating = 90%  Today, he complains of feeling woozy which he feels quite often. He reports faitgue and generally not feeling well.   Past Medical History:  Diagnosis Date   Allergy    Anxiety    not a problem for a long time (11/27/2016)   Arthritis    wrists, knees, shoulders, back (11/27/2016)    Chronic lower back pain    5th disc is protruding (11/27/2016)   Essential tremor    GERD (gastroesophageal reflux disease)    Glaucoma    Heart murmur    dx'd in [employer] in the 1960s; haven't been seen for it since (11/27/2016)   Hepatitis ~ 1958   jaundice kind   HOH (hard of hearing)    Hyperlipemia    Migraine    none since my neck OR (11/27/2016)   Seasonal allergies    TIA (transient ischemic attack) 04/2015   Wears glasses    Wears hearing aid      Family History  Problem Relation Age of Onset   Glaucoma Mother    Dementia Father    Heart failure Father    Colon cancer Neg Hx    Rectal cancer Neg Hx    Stomach cancer Neg Hx    Esophageal cancer Neg Hx    Colon polyps Neg Hx      Social History   Socioeconomic History   Marital status: Married    Spouse name: Not on file   Number of children: 4   Years of education: Not on file   Highest education level: Some college, no degree  Occupational History   Not on file  Tobacco Use   Smoking status: Former    Current packs/day: 0.00    Average packs/day: 2.0 packs/day for 30.0 years (60.0 ttl pk-yrs)  Types: Cigarettes    Start date: 06/03/1954    Quit date: 06/03/1984    Years since quitting: 39.7   Smokeless tobacco: Never   Tobacco comments:    Former smoker (had AAA at the [place]; also noted 2018 on Dr. Claiborne note) 05/01/23  Vaping Use   Vaping status: Never Used  Substance and Sexual Activity   Alcohol use: Never    Alcohol/week: 1.0 standard drink of alcohol    Types: 1 Cans of beer per week   Drug use: No   Sexual activity: Not Currently  Other Topics Concern   Not on file  Social History Narrative   Retired from [employer]   Married    4 children, one son lives locally   Lives with wife   retired   Chief Executive Officer Drivers of Corporate investment banker Strain: Low Risk  (03/23/2023)   Overall Financial Resource Strain (CARDIA)    Difficulty of Paying Living Expenses: Not hard at all  Food  Insecurity: No Food Insecurity (02/02/2024)   Hunger Vital Sign    Worried About Running Out of Food in the Last Year: Never true    Ran Out of Food in the Last Year: Never true  Transportation Needs: No Transportation Needs (02/02/2024)   PRAPARE - Administrator, Civil Service (Medical): No    Lack of Transportation (Non-Medical): No  Physical Activity: Insufficiently Active (03/23/2023)   Exercise Vital Sign    Days of Exercise per Week: 3 days    Minutes of Exercise per Session: 40 min  Stress: No Stress Concern Present (03/23/2023)   [employer] of Occupational Health - Occupational Stress Questionnaire    Feeling of Stress : Not at all  Social Connections: Moderately Isolated (02/02/2024)   Social Connection and Isolation Panel    Frequency of Communication with Friends and Family: More than three times a week    Frequency of Social Gatherings with Friends and Family: More than three times a week    Attends Religious Services: Never    Database administrator or Organizations: No    Attends Banker Meetings: Never    Marital Status: Married  Catering manager Violence: Not At Risk (02/02/2024)   Humiliation, Afraid, Rape, and Kick questionnaire    Fear of Current or Ex-Partner: No    Emotionally Abused: No    Physically Abused: No    Sexually Abused: No     Allergies  Allergen Reactions   Statins Other (See Comments)    Muscle cramps/pain    Aspirin  Other (See Comments)    Bleeding   Atorvastatin Other (See Comments)    Muscle pain     Outpatient Medications Prior to Visit  Medication Sig Dispense Refill   acetaminophen  (TYLENOL ) 325 MG tablet Take 325 mg by mouth every 6 (six) hours as needed for mild pain (pain score 1-3), moderate pain (pain score 4-6) or headache.     albuterol  (VENTOLIN  HFA) 108 (90 Base) MCG/ACT inhaler Inhale 2 puffs into the lungs every 6 (six) hours as needed for wheezing or shortness of breath. 8 g 2   dabigatran   (PRADAXA ) 150 MG CAPS capsule Take 1 capsule (150 mg total) by mouth 2 (two) times daily. 60 capsule 6   escitalopram  (LEXAPRO ) 10 MG tablet Take 1 tablet (10 mg total) by mouth daily. (Patient taking differently: Take 10 mg by mouth at bedtime.) 90 tablet 2   ezetimibe  (ZETIA ) 10 MG tablet TAKE ONE  TABLET BY MOUTH IN THE EVENING 30 tablet 0   fexofenadine  (ALLEGRA ) 180 MG tablet Take 1 tablet (180 mg total) by mouth daily. 30 tablet 2   meclizine  (ANTIVERT ) 12.5 MG tablet Take 1 tablet (12.5 mg total) by mouth 3 (three) times daily as needed for dizziness. (Patient not taking: Reported on 02/02/2024) 30 tablet 0   memantine  (NAMENDA ) 10 MG tablet Take 1 tablet (10 mg total) by mouth 2 (two) times daily. (Patient not taking: Reported on 02/02/2024) 60 tablet 6   memantine  (NAMENDA ) 5 MG tablet Take 5 mg by mouth in the morning and at bedtime.     metoprolol  tartrate (LOPRESSOR ) 50 MG tablet Take 1 tablet (50 mg total) by mouth 2 (two) times daily. (Patient taking differently: Take 50 mg by mouth in the morning and at bedtime.) 60 tablet 0   mirtazapine  (REMERON ) 15 MG tablet Take 1 tablet (15 mg total) by mouth every evening. 30 tablet 0   omeprazole  (PRILOSEC) 20 MG capsule Take 1 capsule (20 mg total) by mouth every morning. 30 capsule 0   polyethylene glycol (MIRALAX  / GLYCOLAX ) 17 g packet Take 17 g by mouth daily. (Patient not taking: Reported on 02/02/2024) 14 each 0   predniSONE  (DELTASONE ) 5 MG tablet Take 8 tablets (40 mg total) by mouth daily with breakfast for 4 days, THEN 6 tablets (30 mg total) daily with breakfast for 4 days, THEN 4 tablets (20 mg total) daily with breakfast for 4 days, THEN 2 tablets (10 mg total) daily with breakfast for 4 days, THEN 1 tablet (5 mg total) daily with breakfast for 4 days. 84 tablet 0   QUEtiapine  (SEROQUEL ) 25 MG tablet TAKE ONE TABLET BY MOUTH AT BEDTIME 30 tablet 1   No facility-administered medications prior to visit.    Review of Systems   Constitutional:  Positive for malaise/fatigue. Negative for chills, fever and weight loss.  HENT:  Negative for congestion, sinus pain and sore throat.   Eyes: Negative.   Respiratory:  Positive for cough, hemoptysis, sputum production and shortness of breath. Negative for wheezing.   Cardiovascular:  Negative for chest pain, palpitations, orthopnea, claudication and leg swelling.  Gastrointestinal:  Negative for abdominal pain, heartburn, nausea and vomiting.  Genitourinary: Negative.   Musculoskeletal:  Negative for joint pain and myalgias.  Skin:  Negative for rash.  Neurological:  Positive for dizziness. Negative for weakness.  Endo/Heme/Allergies: Negative.   Psychiatric/Behavioral: Negative.     Objective:   Vitals:   02/18/24 1138  BP: 119/82  Pulse: (!) 52  SpO2: 92%  Weight: 126 lb (57.2 kg)  Height: 5' 8 (1.727 m)     Physical Exam Constitutional:      General: He is not in acute distress.    Appearance: Normal appearance. He is ill-appearing (chronically).   Eyes:     General: No scleral icterus.    Conjunctiva/sclera: Conjunctivae normal.    Cardiovascular:     Rate and Rhythm: Regular rhythm. Bradycardia present.  Pulmonary:     Breath sounds: Rales (left greater than right) present. No wheezing or rhonchi.   Musculoskeletal:     Right lower leg: No edema.     Left lower leg: No edema.   Skin:    General: Skin is warm and dry.   Neurological:     General: No focal deficit present.       CBC    Component Value Date/Time   WBC 18.7 (H) 02/04/2024 0542   RBC  4.63 02/04/2024 0542   HGB 13.9 02/04/2024 0542   HCT 42.7 02/04/2024 0542   PLT 456 (H) 02/04/2024 0542   MCV 92.2 02/04/2024 0542   MCH 30.0 02/04/2024 0542   MCHC 32.6 02/04/2024 0542   RDW 13.9 02/04/2024 0542   LYMPHSABS 2.2 02/02/2024 1647   MONOABS 0.6 02/02/2024 1647   EOSABS 0.2 02/02/2024 1647   BASOSABS 0.0 02/02/2024 1647      Latest Ref Rng & Units 02/04/2024     5:42 AM 02/03/2024    5:39 AM 02/02/2024    4:47 PM  BMP  Glucose 70 - 99 mg/dL 853  851  97   BUN 8 - 23 mg/dL 35  27  27   Creatinine 0.61 - 1.24 mg/dL 8.36  8.59  8.55   Sodium 135 - 145 mmol/L 134  133  136   Potassium 3.5 - 5.1 mmol/L 4.8  5.1  4.3   Chloride 98 - 111 mmol/L 104  104  101   CO2 22 - 32 mmol/L 22  22  22    Calcium  8.9 - 10.3 mg/dL 9.2  8.8  9.5    Chest imaging: CT Chest 01/26/24 Cardiovascular: Satisfactory opacification of the pulmonary arteries to the segmental level. No evidence of pulmonary embolism. The heart is mildly enlarged. No pericardial effusion.   Mediastinum/Nodes: No enlarged mediastinal, hilar, or axillary lymph nodes. Thyroid  gland, trachea, and esophagus demonstrate no significant findings.   Lungs/Pleura: Peripheral fibrotic changes have significantly increased, particularly in the left upper lobe and left lower lobe. Bullae are again noted peripherally. There is some superimposed ground-glass opacities in the left lower lobe and minimally left upper lobe. There is no consolidation, pneumothorax or pleural effusion. Nodular scarring in the right lung apex measures up to 10 mm, unchanged from prior.  PFT:     No data to display          Labs:  Path:  Echo:  Heart Catheterization:       Assessment & Plan:   Pulmonary fibrosis (HCC) - Plan: Comp Met (CMET), CBC with Differential/Platelet, Sed Rate (ESR), C-reactive protein, Pulse oximetry, overnight, DG Chest 2 View, ANCA screen with reflex titer, ANCA screen with reflex titer, C-reactive protein, Sed Rate (ESR), CBC with Differential/Platelet, Comp Met (CMET)  Chronic hypoxemic respiratory failure (HCC)  Discussion: Jason Farmer is an 89 year old male, remote former smoker with history of GERD and atrial fibrillation who returns to pulmonary clinic for hospital follow up of pulmonary fibrosis.  Pulmonary fibrosis with progressive scarring - Order chest x-ray to compare  with previous imaging. - Repeat labs to check for electrolyte imbalances. - Schedule overnight oxygen test to assess current oxygen therapy. - Consider antifibrotic medication once symptoms are managed. - continue steroid taper as planned  Cough with blood-tinged sputum Cough with blood-tinged sputum for four to five months, increasing in frequency. Potential concern for lung changes contributing to symptoms. - Order chest x-ray to evaluate lung changes. - Consider bronchoscopy if symptoms worsen.  Dizziness and wooziness Intermittent dizziness and wooziness, worsened by deep breaths. Possible contribution from low heart rate due to metoprolol  and prednisone  side effects. Concerns about electrolyte imbalances and oxygen levels. - Repeat labs to check for electrolyte imbalances. - Adjust metoprolol  dose by cutting it in half or stopping it. - Monitor blood pressure and heart rate after metoprolol  adjustment.  I discussed my concern with patient's lung imaging, his oxygen needs and his overall clinical appearance today. I expressed  my concern for a rapidly moving lung disease that could cause him to get sicker and go back to the hospital. I asked him and his son-in-law to discuss advanced directives and code status. I recommended that he not be put on a ventilator as it would be likely hard to get him off successfully.   Follow up in 1 month  Dorn Chill, MD [facility] Office: [phone]     Current Outpatient Medications:    acetaminophen  (TYLENOL ) 325 MG tablet, Take 325 mg by mouth every 6 (six) hours as needed for mild pain (pain score 1-3), moderate pain (pain score 4-6) or headache., Disp: , Rfl:    albuterol  (VENTOLIN  HFA) 108 (90 Base) MCG/ACT inhaler, Inhale 2 puffs into the lungs every 6 (six) hours as needed for wheezing or shortness of breath., Disp: 8 g, Rfl: 2   dabigatran  (PRADAXA ) 150 MG CAPS capsule, Take 1 capsule (150 mg total) by mouth 2  (two) times daily., Disp: 60 capsule, Rfl: 6   escitalopram  (LEXAPRO ) 10 MG tablet, Take 1 tablet (10 mg total) by mouth daily. (Patient taking differently: Take 10 mg by mouth at bedtime.), Disp: 90 tablet, Rfl: 2   ezetimibe  (ZETIA ) 10 MG tablet, TAKE ONE TABLET BY MOUTH IN THE EVENING, Disp: 30 tablet, Rfl: 0   fexofenadine  (ALLEGRA ) 180 MG tablet, Take 1 tablet (180 mg total) by mouth daily., Disp: 30 tablet, Rfl: 2   meclizine  (ANTIVERT ) 12.5 MG tablet, Take 1 tablet (12.5 mg total) by mouth 3 (three) times daily as needed for dizziness. (Patient not taking: Reported on 02/02/2024), Disp: 30 tablet, Rfl: 0   memantine  (NAMENDA ) 10 MG tablet, Take 1 tablet (10 mg total) by mouth 2 (two) times daily. (Patient not taking: Reported on 02/02/2024), Disp: 60 tablet, Rfl: 6   memantine  (NAMENDA ) 5 MG tablet, Take 5 mg by mouth in the morning and at bedtime., Disp: , Rfl:    metoprolol  tartrate (LOPRESSOR ) 50 MG tablet, Take 1 tablet (50 mg total) by mouth 2 (two) times daily. (Patient taking differently: Take 50 mg by mouth in the morning and at bedtime.), Disp: 60 tablet, Rfl: 0   mirtazapine  (REMERON ) 15 MG tablet, Take 1 tablet (15 mg total) by mouth every evening., Disp: 30 tablet, Rfl: 0   omeprazole  (PRILOSEC) 20 MG capsule, Take 1 capsule (20 mg total) by mouth every morning., Disp: 30 capsule, Rfl: 0   polyethylene glycol (MIRALAX  / GLYCOLAX ) 17 g packet, Take 17 g by mouth daily. (Patient not taking: Reported on 02/02/2024), Disp: 14 each, Rfl: 0   predniSONE  (DELTASONE ) 5 MG tablet, Take 8 tablets (40 mg total) by mouth daily with breakfast for 4 days, THEN 6 tablets (30 mg total) daily with breakfast for 4 days, THEN 4 tablets (20 mg total) daily with breakfast for 4 days, THEN 2 tablets (10 mg total) daily with breakfast for 4 days, THEN 1 tablet (5 mg total) daily with breakfast for 4 days., Disp: 84 tablet, Rfl: 0   QUEtiapine  (SEROQUEL ) 25 MG tablet, TAKE ONE TABLET BY MOUTH AT BEDTIME, Disp:  30 tablet, Rfl: 1

## 2024-02-19 ENCOUNTER — Encounter: Payer: Self-pay | Admitting: Pulmonary Disease

## 2024-02-19 ENCOUNTER — Ambulatory Visit: Payer: Self-pay | Admitting: Pulmonary Disease

## 2024-02-22 DIAGNOSIS — K219 Gastro-esophageal reflux disease without esophagitis: Secondary | ICD-10-CM | POA: Diagnosis not present

## 2024-02-22 DIAGNOSIS — E785 Hyperlipidemia, unspecified: Secondary | ICD-10-CM | POA: Diagnosis not present

## 2024-02-22 DIAGNOSIS — I10 Essential (primary) hypertension: Secondary | ICD-10-CM | POA: Diagnosis not present

## 2024-02-23 ENCOUNTER — Other Ambulatory Visit: Payer: Self-pay | Admitting: *Deleted

## 2024-02-23 DIAGNOSIS — J849 Interstitial pulmonary disease, unspecified: Secondary | ICD-10-CM | POA: Diagnosis not present

## 2024-02-23 NOTE — Patient Outreach (Addendum)
 Post- Acute Care Manager follow up. Mr. Sedore resides in [facility] per [facility]. [facility] SNF waiver utilized for admission to SNF.   Secure communication sent to D[place], SNF social worker for collaboration.   Addendum: Update received from Dese[place], SNF social worker. Mr. Gabrielle still plans to return to ILF. Continues to work with therapy.   Will continue to follow.   Pablo Hurst, MSN, RN, BSN [place]  [facility] RN Post- Acute Care Manager Direct Dial: [phone]

## 2024-02-25 LAB — ANCA SCREEN W REFLEX TITER: ANCA SCREEN: NEGATIVE

## 2024-03-07 ENCOUNTER — Ambulatory Visit: Payer: Self-pay

## 2024-03-07 ENCOUNTER — Institutional Professional Consult (permissible substitution): Payer: Medicare Other | Admitting: Psychology

## 2024-03-09 ENCOUNTER — Telehealth: Payer: Self-pay

## 2024-03-09 DIAGNOSIS — J180 Bronchopneumonia, unspecified organism: Secondary | ICD-10-CM | POA: Diagnosis not present

## 2024-03-09 DIAGNOSIS — R918 Other nonspecific abnormal finding of lung field: Secondary | ICD-10-CM | POA: Diagnosis not present

## 2024-03-09 NOTE — Transitions of Care (Post Inpatient/ED Visit) (Unsigned)
   03/09/2024  Name: Jason Farmer MRN: [id number] DOB: 1935/04/04  Today's TOC FU Call Status: Today's TOC FU Call Status:: Unsuccessful Call (1st Attempt) Unsuccessful Call (1st Attempt) Date: 03/09/24  Attempted to reach the patient regarding the most recent Inpatient/ED visit.  Follow Up Plan: Additional outreach attempts will be made to reach the patient to complete the Transitions of Care (Post Inpatient/ED visit) call.   Signature Julian Lemmings, LPN [facility] Nurse Health Advisor Direct Dial [phone]

## 2024-03-10 NOTE — Transitions of Care (Post Inpatient/ED Visit) (Unsigned)
   03/10/2024  Name: Jason Farmer MRN: [id number] DOB: [birth year]  Today's TOC FU Call Status: Today's TOC FU Call Status:: Unsuccessful Call (2nd Attempt) Unsuccessful Call (1st Attempt) Date: 03/09/24 Unsuccessful Call (2nd Attempt) Date: 03/10/24  Attempted to reach the patient regarding the most recent Inpatient/ED visit.  Follow Up Plan: Additional outreach attempts will be made to reach the patient to complete the Transitions of Care (Post Inpatient/ED visit) call.   Signature Julian Lemmings, LPN [facility] Nurse Health Advisor Direct Dial [phone]

## 2024-03-11 NOTE — Transitions of Care (Post Inpatient/ED Visit) (Signed)
   03/11/2024  Name: Jason Farmer MRN: [id number] DOB: [birth year]  Today's TOC FU Call Status: Today's TOC FU Call Status:: Successful TOC FU Call Completed Unsuccessful Call (1st Attempt) Date: 03/09/24 Unsuccessful Call (2nd Attempt) Date: 03/10/24 [facility] FU Call Complete Date: 03/11/24 Patient's Name and Date of Birth confirmed.  Transition Care Management Follow-up Telephone Call Date of Discharge:  (not discharged still in [facility]) Discharge Facility: Other (Non[facility]) Name of Other (Non[facility]) Discharge Facility: [facility] Type of Discharge: Inpatient Admission Primary Inpatient Discharge Diagnosis:: weakness How have you been since you were released from the hospital?: Same Any questions or concerns?: No  Items Reviewed: Did you receive and understand the discharge instructions provided?: Yes Medications obtained,verified, and reconciled?: Yes (Medications Reviewed) Any new allergies since your discharge?: No Dietary orders reviewed?: Yes Do you have support at home?: Yes Name of Support/Comfort Primary Source: [facility]  Medications Reviewed Today: Medications Reviewed Today   Medications were not reviewed in this encounter     Home Care and Equipment/Supplies:    Functional Questionnaire:    Follow up appointments reviewed:    patient is still in [facility]   SIGNATURE Julian Lemmings, LPN [facility] Nurse Health Advisor Direct Dial [phone]

## 2024-03-14 ENCOUNTER — Encounter: Payer: Medicare Other | Admitting: Psychology

## 2024-03-16 ENCOUNTER — Encounter: Payer: Medicare Other | Admitting: Psychology

## 2024-03-22 ENCOUNTER — Encounter: Payer: Self-pay | Admitting: Pulmonary Disease

## 2024-03-22 ENCOUNTER — Ambulatory Visit (INDEPENDENT_AMBULATORY_CARE_PROVIDER_SITE_OTHER): Admitting: Pulmonary Disease

## 2024-03-22 VITALS — BP 115/73 | HR 104 | Ht 68.0 in | Wt 147.0 lb

## 2024-03-22 DIAGNOSIS — R042 Hemoptysis: Secondary | ICD-10-CM

## 2024-03-22 DIAGNOSIS — J8410 Pulmonary fibrosis, unspecified: Secondary | ICD-10-CM

## 2024-03-22 DIAGNOSIS — J9611 Chronic respiratory failure with hypoxia: Secondary | ICD-10-CM | POA: Diagnosis not present

## 2024-03-22 MED ORDER — DOXYCYCLINE HYCLATE 100 MG PO TABS: 100.0000 mg | ORAL_TABLET | Freq: Two times a day (BID) | ORAL | 0 refills | Status: DC

## 2024-03-22 NOTE — Progress Notes (Signed)
 Synopsis: Referred in June 2025 for hospital follow up for pulmonary fibrosis  Subjective:   PATIENT ID: Jason Farmer GENDER: male DOB: 02/22/1935, MRN: [id number]   HPI  Chief Complaint  Patient presents with   Medical Management of Chronic Issues    Pt states coughing up bright red since last month being in hospital    Jason Farmer is an 89 year old male, remote former smoker with history of GERD and atrial fibrillation who returns to pulmonary clinic for pulmonary fibrosis and chronic respiratory failure.  He has experienced hemoptysis approximately four times over the past few weeks, with blood now originating from the lungs. He is on anticoagulation for atrial fibrillation. The volume of blood is concerning. Chills have occurred over the past few days, with an episode of feeling extremely cold despite a warm environment. He feels 'woozy,' interpreted as vertigo, but no fever is present.  He was on a steroid taper for acute inflammation. He is currently on oxygen therapy, requiring 3 to 4 liters at rest, and experiences significant shortness of breath with minimal exertion. He uses a mobility scooter for assistance.  He is no longer participating in physical or occupational therapy, and there is uncertainty about his benefits or risks to his lung condition. He expresses that living is not enjoyable and discusses end-of-life preferences, vs on going work up and treatment.  OV 02/18/24 He was admitted 6/10 to 6/12 for respiratory failure found to have pulmonary fibrosis. He was sent home on 3L of oxygen and a steroid taper for concern of possible ILD flare. Autoimmune testing was negative. He has history of owning an HVAC business and reports very little dust/chemical exposure. He reports chronic cough for decades with sputum production on a daily basis. He quit smoking 40 years ago, but smoked for 30 years multiple packs per day. He has not seen a lung doctor before. He is living at a  retirement home as his wife's health has recently declined needing more support.    CTA PE study is negative for pulmonary emboli. He has peripheral fibrotic changes with craniocaudal gradient. Compared to 2023, increase in left upper lobe fibrosis. Emphysematous changes as well.   He was sent home on supplemental oxygen. O2 from hospital below.  SATURATION QUALIFICATIONS: (This note is used to comply with regulatory documentation for home oxygen)   Patient Saturations on Room Air at Rest = 93%   Patient Saturations on Room Air while Ambulating = 85%   Patient Saturations on 3 Liters of oxygen while Ambulating = 90%  Today, he complains of feeling woozy which he feels quite often. He reports faitgue and generally not feeling well.   Past Medical History:  Diagnosis Date   Allergy    Anxiety    not a problem for a long time (11/27/2016)   Arthritis    wrists, knees, shoulders, back (11/27/2016)   Chronic lower back pain    5th disc is protruding (11/27/2016)   Essential tremor    GERD (gastroesophageal reflux disease)    Glaucoma    Heart murmur    dx'd in [employer] in the 1960s; haven't been seen for it since (11/27/2016)   Hepatitis ~ 1958   jaundice kind   HOH (hard of hearing)    Hyperlipemia    Migraine    none since my neck OR (11/27/2016)   Seasonal allergies    TIA (transient ischemic attack) 04/2015   Wears glasses    Wears hearing aid  Family History  Problem Relation Age of Onset   Glaucoma Mother    Dementia Father    Heart failure Father    Colon cancer Neg Hx    Rectal cancer Neg Hx    Stomach cancer Neg Hx    Esophageal cancer Neg Hx    Colon polyps Neg Hx      Social History   Socioeconomic History   Marital status: Married    Spouse name: Not on file   Number of children: 4   Years of education: Not on file   Highest education level: Some college, no degree  Occupational History   Not on file  Tobacco Use   Smoking status: Former     Current packs/day: 0.00    Average packs/day: 2.0 packs/day for 30.0 years (60.0 ttl pk-yrs)    Types: Cigarettes    Start date: 06/03/1954    Quit date: 06/03/1984    Years since quitting: 39.8   Smokeless tobacco: Never   Tobacco comments:    Former smoker (had AAA at the [place]; also noted 2018 on Dr. Claiborne note) 05/01/23  Vaping Use   Vaping status: Never Used  Substance and Sexual Activity   Alcohol use: Never    Alcohol/week: 1.0 standard drink of alcohol    Types: 1 Cans of beer per week   Drug use: No   Sexual activity: Not Currently  Other Topics Concern   Not on file  Social History Narrative   Retired from [employer]   Married    4 children, one son lives locally   Lives with wife   retired   Chief Executive Officer Drivers of Corporate investment banker Strain: Low Risk  (03/23/2023)   Overall Financial Resource Strain (CARDIA)    Difficulty of Paying Living Expenses: Not hard at all  Food Insecurity: No Food Insecurity (02/02/2024)   Hunger Vital Sign    Worried About Running Out of Food in the Last Year: Never true    Ran Out of Food in the Last Year: Never true  Transportation Needs: No Transportation Needs (02/02/2024)   PRAPARE - Administrator, Civil Service (Medical): No    Lack of Transportation (Non-Medical): No  Physical Activity: Insufficiently Active (03/23/2023)   Exercise Vital Sign    Days of Exercise per Week: 3 days    Minutes of Exercise per Session: 40 min  Stress: No Stress Concern Present (03/23/2023)   [employer] of Occupational Health - Occupational Stress Questionnaire    Feeling of Stress : Not at all  Social Connections: Moderately Isolated (02/02/2024)   Social Connection and Isolation Panel    Frequency of Communication with Friends and Family: More than three times a week    Frequency of Social Gatherings with Friends and Family: More than three times a week    Attends Religious Services: Never    Database administrator or  Organizations: No    Attends Banker Meetings: Never    Marital Status: Married  Catering manager Violence: Not At Risk (02/02/2024)   Humiliation, Afraid, Rape, and Kick questionnaire    Fear of Current or Ex-Partner: No    Emotionally Abused: No    Physically Abused: No    Sexually Abused: No     Allergies  Allergen Reactions   Statins Other (See Comments)    Muscle cramps/pain    Aspirin  Other (See Comments)    Bleeding   Atorvastatin Other (See Comments)  Muscle pain     Outpatient Medications Prior to Visit  Medication Sig Dispense Refill   acetaminophen  (TYLENOL ) 325 MG tablet Take 325 mg by mouth every 6 (six) hours as needed for mild pain (pain score 1-3), moderate pain (pain score 4-6) or headache.     albuterol  (VENTOLIN  HFA) 108 (90 Base) MCG/ACT inhaler Inhale 2 puffs into the lungs every 6 (six) hours as needed for wheezing or shortness of breath. 8 g 2   dabigatran  (PRADAXA ) 150 MG CAPS capsule Take 1 capsule (150 mg total) by mouth 2 (two) times daily. 60 capsule 6   escitalopram  (LEXAPRO ) 10 MG tablet Take 1 tablet (10 mg total) by mouth daily. (Patient taking differently: Take 10 mg by mouth at bedtime.) 90 tablet 2   ezetimibe  (ZETIA ) 10 MG tablet TAKE ONE TABLET BY MOUTH IN THE EVENING 30 tablet 0   fexofenadine  (ALLEGRA ) 180 MG tablet Take 1 tablet (180 mg total) by mouth daily. 30 tablet 2   meclizine  (ANTIVERT ) 12.5 MG tablet Take 1 tablet (12.5 mg total) by mouth 3 (three) times daily as needed for dizziness. 30 tablet 0   memantine  (NAMENDA ) 10 MG tablet Take 1 tablet (10 mg total) by mouth 2 (two) times daily. 60 tablet 6   memantine  (NAMENDA ) 5 MG tablet Take 5 mg by mouth in the morning and at bedtime.     metoprolol  tartrate (LOPRESSOR ) 50 MG tablet Take 1 tablet (50 mg total) by mouth 2 (two) times daily. (Patient taking differently: Take 50 mg by mouth in the morning and at bedtime.) 60 tablet 0   mirtazapine  (REMERON ) 15 MG tablet Take  1 tablet (15 mg total) by mouth every evening. 30 tablet 0   omeprazole  (PRILOSEC) 20 MG capsule Take 1 capsule (20 mg total) by mouth every morning. 30 capsule 0   polyethylene glycol (MIRALAX  / GLYCOLAX ) 17 g packet Take 17 g by mouth daily. 14 each 0   QUEtiapine  (SEROQUEL ) 25 MG tablet TAKE ONE TABLET BY MOUTH AT BEDTIME 30 tablet 1   No facility-administered medications prior to visit.    Review of Systems  Constitutional:  Positive for chills and malaise/fatigue. Negative for fever and weight loss.  HENT:  Negative for congestion, sinus pain and sore throat.   Eyes: Negative.   Respiratory:  Positive for cough, hemoptysis, sputum production and shortness of breath. Negative for wheezing.   Cardiovascular:  Negative for chest pain, palpitations, orthopnea, claudication and leg swelling.  Gastrointestinal:  Negative for abdominal pain, heartburn, nausea and vomiting.  Genitourinary: Negative.   Musculoskeletal:  Negative for joint pain and myalgias.  Skin:  Negative for rash.  Neurological:  Positive for dizziness. Negative for weakness.  Endo/Heme/Allergies: Negative.   Psychiatric/Behavioral: Negative.     Objective:   Vitals:   03/22/24 1500  BP: 115/73  Pulse: (!) 104  SpO2: 98%  Weight: 147 lb (66.7 kg)  Height: 5' 8 (1.727 m)     Physical Exam Constitutional:      General: He is not in acute distress.    Appearance: Normal appearance. He is not ill-appearing (chronically).  Eyes:     General: No scleral icterus.    Conjunctiva/sclera: Conjunctivae normal.  Cardiovascular:     Rate and Rhythm: Regular rhythm. Bradycardia present.  Pulmonary:     Breath sounds: Rales (scattered throughout) present. No wheezing or rhonchi.  Musculoskeletal:     Right lower leg: No edema.     Left lower leg: No edema.  Skin:    General: Skin is warm and dry.  Neurological:     General: No focal deficit present.     CBC    Component Value Date/Time   WBC 15.4 (H)  02/18/2024 1225   RBC 4.82 02/18/2024 1225   HGB 14.0 02/18/2024 1225   HCT 43.2 02/18/2024 1225   PLT 306.0 02/18/2024 1225   MCV 89.6 02/18/2024 1225   MCH 30.0 02/04/2024 0542   MCHC 32.5 02/18/2024 1225   RDW 14.7 02/18/2024 1225   LYMPHSABS 3.0 02/18/2024 1225   MONOABS 0.9 02/18/2024 1225   EOSABS 0.1 02/18/2024 1225   BASOSABS 0.0 02/18/2024 1225      Latest Ref Rng & Units 02/18/2024   12:25 PM 02/04/2024    5:42 AM 02/03/2024    5:39 AM  BMP  Glucose 70 - 99 mg/dL 99  853  851   BUN 6 - 23 mg/dL 28  35  27   Creatinine 0.40 - 1.50 mg/dL 8.67  8.36  8.59   Sodium 135 - 145 mEq/L 137  134  133   Potassium 3.5 - 5.1 mEq/L 4.3  4.8  5.1   Chloride 96 - 112 mEq/L 99  104  104   CO2 19 - 32 mEq/L 33  22  22   Calcium  8.4 - 10.5 mg/dL 9.3  9.2  8.8    Chest imaging: CT Chest 01/26/24 Cardiovascular: Satisfactory opacification of the pulmonary arteries to the segmental level. No evidence of pulmonary embolism. The heart is mildly enlarged. No pericardial effusion.   Mediastinum/Nodes: No enlarged mediastinal, hilar, or axillary lymph nodes. Thyroid  gland, trachea, and esophagus demonstrate no significant findings.   Lungs/Pleura: Peripheral fibrotic changes have significantly increased, particularly in the left upper lobe and left lower lobe. Bullae are again noted peripherally. There is some superimposed ground-glass opacities in the left lower lobe and minimally left upper lobe. There is no consolidation, pneumothorax or pleural effusion. Nodular scarring in the right lung apex measures up to 10 mm, unchanged from prior.  PFT:     No data to display          Labs:  Path:  Echo:  Heart Catheterization:       Assessment & Plan:   Pulmonary fibrosis (HCC) - Plan: CT CHEST HIGH RESOLUTION  Chronic hypoxemic respiratory failure (HCC)  Hemoptysis - Plan: doxycycline  (VIBRA -TABS) 100 MG tablet, CT CHEST HIGH RESOLUTION  Discussion: Koron Godeaux is  an 89 year old male, remote former smoker with history of GERD and atrial fibrillation who returns to pulmonary clinic for follow up of pulmonary fibrosis.  Pulmonary fibrosis with progressive scarring - he has completed steroid taper with minimal benefit - discussed antifibrotic options, did not start at last visit due to frailty.  - check HRCT chest given on going hemoptysis in setting of anticoagualation - reviewed history of fibrosis with patient's daughter  Hemoptysis - less likely pulmonary emboli as he is on anticoagulation - check HRCT chest - negative autoimmune panel from admission - discussed possible seronegative vasculitis but no renal involvement at this time based on labs.  -discussed potential for bronchoscopy  We discussed multiple scenarios during our visit today. We reviewed details of taking a palliative approach and not pursuing aggressive work up. I expressed my concern that he will likely be on oxygen for life at this point. We will await further discussion based on CT Chest scan results.   Follow up in 2 months  Dorn Chill,  MD [facility] Office: [phone]     Current Outpatient Medications:    acetaminophen  (TYLENOL ) 325 MG tablet, Take 325 mg by mouth every 6 (six) hours as needed for mild pain (pain score 1-3), moderate pain (pain score 4-6) or headache., Disp: , Rfl:    albuterol  (VENTOLIN  HFA) 108 (90 Base) MCG/ACT inhaler, Inhale 2 puffs into the lungs every 6 (six) hours as needed for wheezing or shortness of breath., Disp: 8 g, Rfl: 2   dabigatran  (PRADAXA ) 150 MG CAPS capsule, Take 1 capsule (150 mg total) by mouth 2 (two) times daily., Disp: 60 capsule, Rfl: 6   doxycycline  (VIBRA -TABS) 100 MG tablet, Take 1 tablet (100 mg total) by mouth 2 (two) times daily., Disp: 14 tablet, Rfl: 0   escitalopram  (LEXAPRO ) 10 MG tablet, Take 1 tablet (10 mg total) by mouth daily. (Patient taking differently: Take 10 mg by mouth at  bedtime.), Disp: 90 tablet, Rfl: 2   ezetimibe  (ZETIA ) 10 MG tablet, TAKE ONE TABLET BY MOUTH IN THE EVENING, Disp: 30 tablet, Rfl: 0   fexofenadine  (ALLEGRA ) 180 MG tablet, Take 1 tablet (180 mg total) by mouth daily., Disp: 30 tablet, Rfl: 2   meclizine  (ANTIVERT ) 12.5 MG tablet, Take 1 tablet (12.5 mg total) by mouth 3 (three) times daily as needed for dizziness., Disp: 30 tablet, Rfl: 0   memantine  (NAMENDA ) 10 MG tablet, Take 1 tablet (10 mg total) by mouth 2 (two) times daily., Disp: 60 tablet, Rfl: 6   memantine  (NAMENDA ) 5 MG tablet, Take 5 mg by mouth in the morning and at bedtime., Disp: , Rfl:    metoprolol  tartrate (LOPRESSOR ) 50 MG tablet, Take 1 tablet (50 mg total) by mouth 2 (two) times daily. (Patient taking differently: Take 50 mg by mouth in the morning and at bedtime.), Disp: 60 tablet, Rfl: 0   mirtazapine  (REMERON ) 15 MG tablet, Take 1 tablet (15 mg total) by mouth every evening., Disp: 30 tablet, Rfl: 0   omeprazole  (PRILOSEC) 20 MG capsule, Take 1 capsule (20 mg total) by mouth every morning., Disp: 30 capsule, Rfl: 0   polyethylene glycol (MIRALAX  / GLYCOLAX ) 17 g packet, Take 17 g by mouth daily., Disp: 14 each, Rfl: 0   QUEtiapine  (SEROQUEL ) 25 MG tablet, TAKE ONE TABLET BY MOUTH AT BEDTIME, Disp: 30 tablet, Rfl: 1

## 2024-03-22 NOTE — Patient Instructions (Addendum)
 Start doxycycline  1 tab twice daily  Monitor for improvement in your hemoptysis or coughing up blood.   We will check a high resolution CT Chest scan for further evaluation  We will discuss the next steps once we have the results of your CT Chest scan  Follow up in 2 months, will review CT chest scan results with you sooner

## 2024-03-23 ENCOUNTER — Ambulatory Visit (HOSPITAL_BASED_OUTPATIENT_CLINIC_OR_DEPARTMENT_OTHER)
Admission: RE | Admit: 2024-03-23 | Discharge: 2024-03-23 | Disposition: A | Discharge status: Home / Self Care | Source: Ambulatory Visit | Attending: Pulmonary Disease | Admitting: Pulmonary Disease

## 2024-03-23 ENCOUNTER — Ambulatory Visit: Admitting: Physician Assistant

## 2024-03-23 DIAGNOSIS — J8410 Pulmonary fibrosis, unspecified: Secondary | ICD-10-CM | POA: Diagnosis not present

## 2024-03-23 DIAGNOSIS — J479 Bronchiectasis, uncomplicated: Secondary | ICD-10-CM | POA: Diagnosis not present

## 2024-03-23 DIAGNOSIS — J849 Interstitial pulmonary disease, unspecified: Secondary | ICD-10-CM | POA: Diagnosis not present

## 2024-03-23 DIAGNOSIS — R042 Hemoptysis: Secondary | ICD-10-CM | POA: Diagnosis not present

## 2024-03-23 DIAGNOSIS — J984 Other disorders of lung: Secondary | ICD-10-CM | POA: Diagnosis not present

## 2024-03-24 ENCOUNTER — Ambulatory Visit: Payer: Self-pay | Admitting: Pulmonary Disease

## 2024-03-28 DIAGNOSIS — R2681 Unsteadiness on feet: Secondary | ICD-10-CM | POA: Diagnosis not present

## 2024-03-28 DIAGNOSIS — R1314 Dysphagia, pharyngoesophageal phase: Secondary | ICD-10-CM | POA: Diagnosis not present

## 2024-03-28 DIAGNOSIS — J449 Chronic obstructive pulmonary disease, unspecified: Secondary | ICD-10-CM | POA: Diagnosis not present

## 2024-03-28 DIAGNOSIS — R441 Visual hallucinations: Secondary | ICD-10-CM | POA: Diagnosis not present

## 2024-03-28 DIAGNOSIS — J9621 Acute and chronic respiratory failure with hypoxia: Secondary | ICD-10-CM | POA: Diagnosis not present

## 2024-03-28 DIAGNOSIS — G4730 Sleep apnea, unspecified: Secondary | ICD-10-CM | POA: Diagnosis not present

## 2024-03-28 DIAGNOSIS — J189 Pneumonia, unspecified organism: Secondary | ICD-10-CM | POA: Diagnosis not present

## 2024-03-28 DIAGNOSIS — F02B0 Dementia in other diseases classified elsewhere, moderate, without behavioral disturbance, psychotic disturbance, mood disturbance, and anxiety: Secondary | ICD-10-CM | POA: Diagnosis not present

## 2024-03-28 DIAGNOSIS — R278 Other lack of coordination: Secondary | ICD-10-CM | POA: Diagnosis not present

## 2024-03-28 DIAGNOSIS — R0902 Hypoxemia: Secondary | ICD-10-CM | POA: Diagnosis not present

## 2024-03-28 DIAGNOSIS — M6281 Muscle weakness (generalized): Secondary | ICD-10-CM | POA: Diagnosis not present

## 2024-03-28 DIAGNOSIS — F331 Major depressive disorder, recurrent, moderate: Secondary | ICD-10-CM | POA: Diagnosis not present

## 2024-03-28 DIAGNOSIS — G309 Alzheimer's disease, unspecified: Secondary | ICD-10-CM | POA: Diagnosis not present

## 2024-03-28 DIAGNOSIS — J849 Interstitial pulmonary disease, unspecified: Secondary | ICD-10-CM | POA: Diagnosis not present

## 2024-03-30 DIAGNOSIS — R2681 Unsteadiness on feet: Secondary | ICD-10-CM | POA: Diagnosis not present

## 2024-03-30 DIAGNOSIS — M6281 Muscle weakness (generalized): Secondary | ICD-10-CM | POA: Diagnosis not present

## 2024-03-30 DIAGNOSIS — R278 Other lack of coordination: Secondary | ICD-10-CM | POA: Diagnosis not present

## 2024-03-30 DIAGNOSIS — J9621 Acute and chronic respiratory failure with hypoxia: Secondary | ICD-10-CM | POA: Diagnosis not present

## 2024-03-30 DIAGNOSIS — J849 Interstitial pulmonary disease, unspecified: Secondary | ICD-10-CM | POA: Diagnosis not present

## 2024-03-30 DIAGNOSIS — J449 Chronic obstructive pulmonary disease, unspecified: Secondary | ICD-10-CM | POA: Diagnosis not present

## 2024-03-31 ENCOUNTER — Ambulatory Visit: Admitting: Internal Medicine

## 2024-03-31 DIAGNOSIS — M6281 Muscle weakness (generalized): Secondary | ICD-10-CM | POA: Diagnosis not present

## 2024-03-31 DIAGNOSIS — R2681 Unsteadiness on feet: Secondary | ICD-10-CM | POA: Diagnosis not present

## 2024-03-31 DIAGNOSIS — J449 Chronic obstructive pulmonary disease, unspecified: Secondary | ICD-10-CM | POA: Diagnosis not present

## 2024-03-31 DIAGNOSIS — J849 Interstitial pulmonary disease, unspecified: Secondary | ICD-10-CM | POA: Diagnosis not present

## 2024-03-31 DIAGNOSIS — J9621 Acute and chronic respiratory failure with hypoxia: Secondary | ICD-10-CM | POA: Diagnosis not present

## 2024-03-31 DIAGNOSIS — R278 Other lack of coordination: Secondary | ICD-10-CM | POA: Diagnosis not present

## 2024-04-01 ENCOUNTER — Other Ambulatory Visit: Payer: Self-pay | Admitting: *Deleted

## 2024-04-01 DIAGNOSIS — M6281 Muscle weakness (generalized): Secondary | ICD-10-CM | POA: Diagnosis not present

## 2024-04-01 DIAGNOSIS — J849 Interstitial pulmonary disease, unspecified: Secondary | ICD-10-CM | POA: Diagnosis not present

## 2024-04-01 DIAGNOSIS — J9621 Acute and chronic respiratory failure with hypoxia: Secondary | ICD-10-CM | POA: Diagnosis not present

## 2024-04-01 DIAGNOSIS — R2681 Unsteadiness on feet: Secondary | ICD-10-CM | POA: Diagnosis not present

## 2024-04-01 DIAGNOSIS — R278 Other lack of coordination: Secondary | ICD-10-CM | POA: Diagnosis not present

## 2024-04-01 DIAGNOSIS — J449 Chronic obstructive pulmonary disease, unspecified: Secondary | ICD-10-CM | POA: Diagnosis not present

## 2024-04-01 NOTE — Patient Outreach (Signed)
 Verified in [facility], Mr. Westfall discharged from [facility] SNF on 03/08/24. [facility] SNF waiver previously utilized for SNF admission.  Returned to [facility].  Pablo Hurst, MSN, RN, BSN [place]  [facility] RN Post- Acute Care Manager Direct Dial: [phone]

## 2024-04-04 DIAGNOSIS — R2681 Unsteadiness on feet: Secondary | ICD-10-CM | POA: Diagnosis not present

## 2024-04-04 DIAGNOSIS — M6281 Muscle weakness (generalized): Secondary | ICD-10-CM | POA: Diagnosis not present

## 2024-04-04 DIAGNOSIS — R278 Other lack of coordination: Secondary | ICD-10-CM | POA: Diagnosis not present

## 2024-04-04 DIAGNOSIS — J849 Interstitial pulmonary disease, unspecified: Secondary | ICD-10-CM | POA: Diagnosis not present

## 2024-04-04 DIAGNOSIS — J9621 Acute and chronic respiratory failure with hypoxia: Secondary | ICD-10-CM | POA: Diagnosis not present

## 2024-04-04 DIAGNOSIS — J449 Chronic obstructive pulmonary disease, unspecified: Secondary | ICD-10-CM | POA: Diagnosis not present

## 2024-04-05 DIAGNOSIS — J449 Chronic obstructive pulmonary disease, unspecified: Secondary | ICD-10-CM | POA: Diagnosis not present

## 2024-04-05 DIAGNOSIS — R2681 Unsteadiness on feet: Secondary | ICD-10-CM | POA: Diagnosis not present

## 2024-04-05 DIAGNOSIS — J9621 Acute and chronic respiratory failure with hypoxia: Secondary | ICD-10-CM | POA: Diagnosis not present

## 2024-04-05 DIAGNOSIS — M6281 Muscle weakness (generalized): Secondary | ICD-10-CM | POA: Diagnosis not present

## 2024-04-05 DIAGNOSIS — Z515 Encounter for palliative care: Secondary | ICD-10-CM | POA: Diagnosis not present

## 2024-04-05 DIAGNOSIS — J849 Interstitial pulmonary disease, unspecified: Secondary | ICD-10-CM | POA: Diagnosis not present

## 2024-04-05 DIAGNOSIS — R278 Other lack of coordination: Secondary | ICD-10-CM | POA: Diagnosis not present

## 2024-04-06 DIAGNOSIS — F060 Psychotic disorder with hallucinations due to known physiological condition: Secondary | ICD-10-CM | POA: Diagnosis not present

## 2024-04-06 DIAGNOSIS — J439 Emphysema, unspecified: Secondary | ICD-10-CM | POA: Diagnosis not present

## 2024-04-06 DIAGNOSIS — F323 Major depressive disorder, single episode, severe with psychotic features: Secondary | ICD-10-CM | POA: Diagnosis not present

## 2024-04-06 DIAGNOSIS — F329 Major depressive disorder, single episode, unspecified: Secondary | ICD-10-CM | POA: Diagnosis not present

## 2024-04-06 DIAGNOSIS — G459 Transient cerebral ischemic attack, unspecified: Secondary | ICD-10-CM | POA: Diagnosis not present

## 2024-04-06 DIAGNOSIS — J309 Allergic rhinitis, unspecified: Secondary | ICD-10-CM | POA: Diagnosis not present

## 2024-04-06 DIAGNOSIS — K219 Gastro-esophageal reflux disease without esophagitis: Secondary | ICD-10-CM | POA: Diagnosis not present

## 2024-04-06 DIAGNOSIS — I480 Paroxysmal atrial fibrillation: Secondary | ICD-10-CM | POA: Diagnosis not present

## 2024-04-06 DIAGNOSIS — J449 Chronic obstructive pulmonary disease, unspecified: Secondary | ICD-10-CM | POA: Diagnosis not present

## 2024-04-06 DIAGNOSIS — J84112 Idiopathic pulmonary fibrosis: Secondary | ICD-10-CM | POA: Diagnosis not present

## 2024-04-06 DIAGNOSIS — E060 Acute thyroiditis: Secondary | ICD-10-CM | POA: Diagnosis not present

## 2024-04-06 DIAGNOSIS — I10 Essential (primary) hypertension: Secondary | ICD-10-CM | POA: Diagnosis not present

## 2024-04-06 DIAGNOSIS — J019 Acute sinusitis, unspecified: Secondary | ICD-10-CM | POA: Diagnosis not present

## 2024-04-08 DIAGNOSIS — K219 Gastro-esophageal reflux disease without esophagitis: Secondary | ICD-10-CM | POA: Diagnosis not present

## 2024-04-08 DIAGNOSIS — J84112 Idiopathic pulmonary fibrosis: Secondary | ICD-10-CM | POA: Diagnosis not present

## 2024-04-08 DIAGNOSIS — J449 Chronic obstructive pulmonary disease, unspecified: Secondary | ICD-10-CM | POA: Diagnosis not present

## 2024-04-08 DIAGNOSIS — F329 Major depressive disorder, single episode, unspecified: Secondary | ICD-10-CM | POA: Diagnosis not present

## 2024-04-08 DIAGNOSIS — I10 Essential (primary) hypertension: Secondary | ICD-10-CM | POA: Diagnosis not present

## 2024-04-08 DIAGNOSIS — F060 Psychotic disorder with hallucinations due to known physiological condition: Secondary | ICD-10-CM | POA: Diagnosis not present

## 2024-04-11 DIAGNOSIS — F331 Major depressive disorder, recurrent, moderate: Secondary | ICD-10-CM | POA: Diagnosis not present

## 2024-04-11 DIAGNOSIS — G309 Alzheimer's disease, unspecified: Secondary | ICD-10-CM | POA: Diagnosis not present

## 2024-04-11 DIAGNOSIS — F02B0 Dementia in other diseases classified elsewhere, moderate, without behavioral disturbance, psychotic disturbance, mood disturbance, and anxiety: Secondary | ICD-10-CM | POA: Diagnosis not present

## 2024-04-12 ENCOUNTER — Encounter: Payer: Self-pay | Admitting: Pulmonary Disease

## 2024-04-13 DIAGNOSIS — J84112 Idiopathic pulmonary fibrosis: Secondary | ICD-10-CM | POA: Diagnosis not present

## 2024-04-13 DIAGNOSIS — F329 Major depressive disorder, single episode, unspecified: Secondary | ICD-10-CM | POA: Diagnosis not present

## 2024-04-13 DIAGNOSIS — F060 Psychotic disorder with hallucinations due to known physiological condition: Secondary | ICD-10-CM | POA: Diagnosis not present

## 2024-04-13 DIAGNOSIS — K219 Gastro-esophageal reflux disease without esophagitis: Secondary | ICD-10-CM | POA: Diagnosis not present

## 2024-04-13 DIAGNOSIS — I10 Essential (primary) hypertension: Secondary | ICD-10-CM | POA: Diagnosis not present

## 2024-04-13 DIAGNOSIS — J449 Chronic obstructive pulmonary disease, unspecified: Secondary | ICD-10-CM | POA: Diagnosis not present

## 2024-04-15 DIAGNOSIS — F060 Psychotic disorder with hallucinations due to known physiological condition: Secondary | ICD-10-CM | POA: Diagnosis not present

## 2024-04-15 DIAGNOSIS — I10 Essential (primary) hypertension: Secondary | ICD-10-CM | POA: Diagnosis not present

## 2024-04-15 DIAGNOSIS — F329 Major depressive disorder, single episode, unspecified: Secondary | ICD-10-CM | POA: Diagnosis not present

## 2024-04-15 DIAGNOSIS — J449 Chronic obstructive pulmonary disease, unspecified: Secondary | ICD-10-CM | POA: Diagnosis not present

## 2024-04-15 DIAGNOSIS — J84112 Idiopathic pulmonary fibrosis: Secondary | ICD-10-CM | POA: Diagnosis not present

## 2024-04-15 DIAGNOSIS — E785 Hyperlipidemia, unspecified: Secondary | ICD-10-CM | POA: Diagnosis not present

## 2024-04-15 DIAGNOSIS — K219 Gastro-esophageal reflux disease without esophagitis: Secondary | ICD-10-CM | POA: Diagnosis not present

## 2024-04-17 DIAGNOSIS — I10 Essential (primary) hypertension: Secondary | ICD-10-CM | POA: Diagnosis not present

## 2024-04-17 DIAGNOSIS — F060 Psychotic disorder with hallucinations due to known physiological condition: Secondary | ICD-10-CM | POA: Diagnosis not present

## 2024-04-17 DIAGNOSIS — K219 Gastro-esophageal reflux disease without esophagitis: Secondary | ICD-10-CM | POA: Diagnosis not present

## 2024-04-17 DIAGNOSIS — J84112 Idiopathic pulmonary fibrosis: Secondary | ICD-10-CM | POA: Diagnosis not present

## 2024-04-17 DIAGNOSIS — J449 Chronic obstructive pulmonary disease, unspecified: Secondary | ICD-10-CM | POA: Diagnosis not present

## 2024-04-17 DIAGNOSIS — F329 Major depressive disorder, single episode, unspecified: Secondary | ICD-10-CM | POA: Diagnosis not present

## 2024-04-18 ENCOUNTER — Ambulatory Visit: Payer: Medicare Other | Admitting: Physician Assistant

## 2024-04-18 DIAGNOSIS — F060 Psychotic disorder with hallucinations due to known physiological condition: Secondary | ICD-10-CM | POA: Diagnosis not present

## 2024-04-18 DIAGNOSIS — K219 Gastro-esophageal reflux disease without esophagitis: Secondary | ICD-10-CM | POA: Diagnosis not present

## 2024-04-18 DIAGNOSIS — I10 Essential (primary) hypertension: Secondary | ICD-10-CM | POA: Diagnosis not present

## 2024-04-18 DIAGNOSIS — J84112 Idiopathic pulmonary fibrosis: Secondary | ICD-10-CM | POA: Diagnosis not present

## 2024-04-18 DIAGNOSIS — J449 Chronic obstructive pulmonary disease, unspecified: Secondary | ICD-10-CM | POA: Diagnosis not present

## 2024-04-18 DIAGNOSIS — F329 Major depressive disorder, single episode, unspecified: Secondary | ICD-10-CM | POA: Diagnosis not present

## 2024-04-20 DIAGNOSIS — K219 Gastro-esophageal reflux disease without esophagitis: Secondary | ICD-10-CM | POA: Diagnosis not present

## 2024-04-20 DIAGNOSIS — I10 Essential (primary) hypertension: Secondary | ICD-10-CM | POA: Diagnosis not present

## 2024-04-20 DIAGNOSIS — F060 Psychotic disorder with hallucinations due to known physiological condition: Secondary | ICD-10-CM | POA: Diagnosis not present

## 2024-04-20 DIAGNOSIS — J449 Chronic obstructive pulmonary disease, unspecified: Secondary | ICD-10-CM | POA: Diagnosis not present

## 2024-04-20 DIAGNOSIS — J84112 Idiopathic pulmonary fibrosis: Secondary | ICD-10-CM | POA: Diagnosis not present

## 2024-04-20 DIAGNOSIS — F329 Major depressive disorder, single episode, unspecified: Secondary | ICD-10-CM | POA: Diagnosis not present

## 2024-04-22 DIAGNOSIS — F329 Major depressive disorder, single episode, unspecified: Secondary | ICD-10-CM | POA: Diagnosis not present

## 2024-04-22 DIAGNOSIS — K219 Gastro-esophageal reflux disease without esophagitis: Secondary | ICD-10-CM | POA: Diagnosis not present

## 2024-04-22 DIAGNOSIS — J84112 Idiopathic pulmonary fibrosis: Secondary | ICD-10-CM | POA: Diagnosis not present

## 2024-04-22 DIAGNOSIS — F060 Psychotic disorder with hallucinations due to known physiological condition: Secondary | ICD-10-CM | POA: Diagnosis not present

## 2024-04-22 DIAGNOSIS — I10 Essential (primary) hypertension: Secondary | ICD-10-CM | POA: Diagnosis not present

## 2024-04-22 DIAGNOSIS — J449 Chronic obstructive pulmonary disease, unspecified: Secondary | ICD-10-CM | POA: Diagnosis not present

## 2024-04-25 DIAGNOSIS — I10 Essential (primary) hypertension: Secondary | ICD-10-CM | POA: Diagnosis not present

## 2024-04-25 DIAGNOSIS — J84112 Idiopathic pulmonary fibrosis: Secondary | ICD-10-CM | POA: Diagnosis not present

## 2024-04-25 DIAGNOSIS — J439 Emphysema, unspecified: Secondary | ICD-10-CM | POA: Diagnosis not present

## 2024-04-25 DIAGNOSIS — J309 Allergic rhinitis, unspecified: Secondary | ICD-10-CM | POA: Diagnosis not present

## 2024-04-25 DIAGNOSIS — I480 Paroxysmal atrial fibrillation: Secondary | ICD-10-CM | POA: Diagnosis not present

## 2024-04-25 DIAGNOSIS — G459 Transient cerebral ischemic attack, unspecified: Secondary | ICD-10-CM | POA: Diagnosis not present

## 2024-04-25 DIAGNOSIS — J449 Chronic obstructive pulmonary disease, unspecified: Secondary | ICD-10-CM | POA: Diagnosis not present

## 2024-04-25 DIAGNOSIS — F323 Major depressive disorder, single episode, severe with psychotic features: Secondary | ICD-10-CM | POA: Diagnosis not present

## 2024-04-25 DIAGNOSIS — F060 Psychotic disorder with hallucinations due to known physiological condition: Secondary | ICD-10-CM | POA: Diagnosis not present

## 2024-04-25 DIAGNOSIS — F329 Major depressive disorder, single episode, unspecified: Secondary | ICD-10-CM | POA: Diagnosis not present

## 2024-04-25 DIAGNOSIS — K219 Gastro-esophageal reflux disease without esophagitis: Secondary | ICD-10-CM | POA: Diagnosis not present

## 2024-04-25 DIAGNOSIS — J019 Acute sinusitis, unspecified: Secondary | ICD-10-CM | POA: Diagnosis not present

## 2024-04-27 ENCOUNTER — Telehealth: Payer: Self-pay | Admitting: Pulmonary Disease

## 2024-04-27 DIAGNOSIS — J449 Chronic obstructive pulmonary disease, unspecified: Secondary | ICD-10-CM | POA: Diagnosis not present

## 2024-04-27 DIAGNOSIS — K219 Gastro-esophageal reflux disease without esophagitis: Secondary | ICD-10-CM | POA: Diagnosis not present

## 2024-04-27 DIAGNOSIS — I10 Essential (primary) hypertension: Secondary | ICD-10-CM | POA: Diagnosis not present

## 2024-04-27 DIAGNOSIS — J84112 Idiopathic pulmonary fibrosis: Secondary | ICD-10-CM | POA: Diagnosis not present

## 2024-04-27 DIAGNOSIS — F060 Psychotic disorder with hallucinations due to known physiological condition: Secondary | ICD-10-CM | POA: Diagnosis not present

## 2024-04-27 DIAGNOSIS — F329 Major depressive disorder, single episode, unspecified: Secondary | ICD-10-CM | POA: Diagnosis not present

## 2024-04-27 NOTE — Telephone Encounter (Signed)
 ONO Results:  He spent 55 seconds with SpO2 less than 88% on 3L of O2. He can continue 3L of oxygen at night when sleeping.  JD

## 2024-04-28 DIAGNOSIS — F060 Psychotic disorder with hallucinations due to known physiological condition: Secondary | ICD-10-CM | POA: Diagnosis not present

## 2024-04-28 DIAGNOSIS — J84112 Idiopathic pulmonary fibrosis: Secondary | ICD-10-CM | POA: Diagnosis not present

## 2024-04-28 DIAGNOSIS — K219 Gastro-esophageal reflux disease without esophagitis: Secondary | ICD-10-CM | POA: Diagnosis not present

## 2024-04-28 DIAGNOSIS — I10 Essential (primary) hypertension: Secondary | ICD-10-CM | POA: Diagnosis not present

## 2024-04-28 DIAGNOSIS — J449 Chronic obstructive pulmonary disease, unspecified: Secondary | ICD-10-CM | POA: Diagnosis not present

## 2024-04-28 DIAGNOSIS — F329 Major depressive disorder, single episode, unspecified: Secondary | ICD-10-CM | POA: Diagnosis not present

## 2024-04-28 NOTE — Telephone Encounter (Signed)
 Spoke w/ PT [facility].   -NFN

## 2024-04-29 DIAGNOSIS — J84112 Idiopathic pulmonary fibrosis: Secondary | ICD-10-CM | POA: Diagnosis not present

## 2024-04-29 DIAGNOSIS — J449 Chronic obstructive pulmonary disease, unspecified: Secondary | ICD-10-CM | POA: Diagnosis not present

## 2024-04-29 DIAGNOSIS — F060 Psychotic disorder with hallucinations due to known physiological condition: Secondary | ICD-10-CM | POA: Diagnosis not present

## 2024-04-29 DIAGNOSIS — F329 Major depressive disorder, single episode, unspecified: Secondary | ICD-10-CM | POA: Diagnosis not present

## 2024-04-29 DIAGNOSIS — K219 Gastro-esophageal reflux disease without esophagitis: Secondary | ICD-10-CM | POA: Diagnosis not present

## 2024-04-29 DIAGNOSIS — I10 Essential (primary) hypertension: Secondary | ICD-10-CM | POA: Diagnosis not present

## 2024-05-02 DIAGNOSIS — F331 Major depressive disorder, recurrent, moderate: Secondary | ICD-10-CM | POA: Diagnosis not present

## 2024-05-04 DIAGNOSIS — J84112 Idiopathic pulmonary fibrosis: Secondary | ICD-10-CM | POA: Diagnosis not present

## 2024-05-04 DIAGNOSIS — K219 Gastro-esophageal reflux disease without esophagitis: Secondary | ICD-10-CM | POA: Diagnosis not present

## 2024-05-04 DIAGNOSIS — F329 Major depressive disorder, single episode, unspecified: Secondary | ICD-10-CM | POA: Diagnosis not present

## 2024-05-04 DIAGNOSIS — I10 Essential (primary) hypertension: Secondary | ICD-10-CM | POA: Diagnosis not present

## 2024-05-04 DIAGNOSIS — F060 Psychotic disorder with hallucinations due to known physiological condition: Secondary | ICD-10-CM | POA: Diagnosis not present

## 2024-05-04 DIAGNOSIS — J449 Chronic obstructive pulmonary disease, unspecified: Secondary | ICD-10-CM | POA: Diagnosis not present

## 2024-05-06 DIAGNOSIS — K219 Gastro-esophageal reflux disease without esophagitis: Secondary | ICD-10-CM | POA: Diagnosis not present

## 2024-05-06 DIAGNOSIS — J84112 Idiopathic pulmonary fibrosis: Secondary | ICD-10-CM | POA: Diagnosis not present

## 2024-05-06 DIAGNOSIS — I10 Essential (primary) hypertension: Secondary | ICD-10-CM | POA: Diagnosis not present

## 2024-05-06 DIAGNOSIS — J449 Chronic obstructive pulmonary disease, unspecified: Secondary | ICD-10-CM | POA: Diagnosis not present

## 2024-05-06 DIAGNOSIS — F329 Major depressive disorder, single episode, unspecified: Secondary | ICD-10-CM | POA: Diagnosis not present

## 2024-05-06 DIAGNOSIS — F060 Psychotic disorder with hallucinations due to known physiological condition: Secondary | ICD-10-CM | POA: Diagnosis not present

## 2024-05-09 DIAGNOSIS — F02B0 Dementia in other diseases classified elsewhere, moderate, without behavioral disturbance, psychotic disturbance, mood disturbance, and anxiety: Secondary | ICD-10-CM | POA: Diagnosis not present

## 2024-05-09 DIAGNOSIS — F331 Major depressive disorder, recurrent, moderate: Secondary | ICD-10-CM | POA: Diagnosis not present

## 2024-05-09 DIAGNOSIS — G301 Alzheimer's disease with late onset: Secondary | ICD-10-CM | POA: Diagnosis not present

## 2024-05-09 DIAGNOSIS — R443 Hallucinations, unspecified: Secondary | ICD-10-CM | POA: Diagnosis not present

## 2024-05-13 DIAGNOSIS — F329 Major depressive disorder, single episode, unspecified: Secondary | ICD-10-CM | POA: Diagnosis not present

## 2024-05-13 DIAGNOSIS — K219 Gastro-esophageal reflux disease without esophagitis: Secondary | ICD-10-CM | POA: Diagnosis not present

## 2024-05-13 DIAGNOSIS — J449 Chronic obstructive pulmonary disease, unspecified: Secondary | ICD-10-CM | POA: Diagnosis not present

## 2024-05-13 DIAGNOSIS — J84112 Idiopathic pulmonary fibrosis: Secondary | ICD-10-CM | POA: Diagnosis not present

## 2024-05-13 DIAGNOSIS — I10 Essential (primary) hypertension: Secondary | ICD-10-CM | POA: Diagnosis not present

## 2024-05-13 DIAGNOSIS — F060 Psychotic disorder with hallucinations due to known physiological condition: Secondary | ICD-10-CM | POA: Diagnosis not present

## 2024-05-19 DIAGNOSIS — F060 Psychotic disorder with hallucinations due to known physiological condition: Secondary | ICD-10-CM | POA: Diagnosis not present

## 2024-05-19 DIAGNOSIS — I10 Essential (primary) hypertension: Secondary | ICD-10-CM | POA: Diagnosis not present

## 2024-05-19 DIAGNOSIS — F329 Major depressive disorder, single episode, unspecified: Secondary | ICD-10-CM | POA: Diagnosis not present

## 2024-05-19 DIAGNOSIS — J449 Chronic obstructive pulmonary disease, unspecified: Secondary | ICD-10-CM | POA: Diagnosis not present

## 2024-05-19 DIAGNOSIS — K219 Gastro-esophageal reflux disease without esophagitis: Secondary | ICD-10-CM | POA: Diagnosis not present

## 2024-05-19 DIAGNOSIS — J84112 Idiopathic pulmonary fibrosis: Secondary | ICD-10-CM | POA: Diagnosis not present

## 2024-05-20 DIAGNOSIS — F329 Major depressive disorder, single episode, unspecified: Secondary | ICD-10-CM | POA: Diagnosis not present

## 2024-05-20 DIAGNOSIS — I10 Essential (primary) hypertension: Secondary | ICD-10-CM | POA: Diagnosis not present

## 2024-05-20 DIAGNOSIS — J449 Chronic obstructive pulmonary disease, unspecified: Secondary | ICD-10-CM | POA: Diagnosis not present

## 2024-05-20 DIAGNOSIS — K219 Gastro-esophageal reflux disease without esophagitis: Secondary | ICD-10-CM | POA: Diagnosis not present

## 2024-05-20 DIAGNOSIS — F060 Psychotic disorder with hallucinations due to known physiological condition: Secondary | ICD-10-CM | POA: Diagnosis not present

## 2024-05-20 DIAGNOSIS — J84112 Idiopathic pulmonary fibrosis: Secondary | ICD-10-CM | POA: Diagnosis not present

## 2024-05-23 DIAGNOSIS — F060 Psychotic disorder with hallucinations due to known physiological condition: Secondary | ICD-10-CM | POA: Diagnosis not present

## 2024-05-23 DIAGNOSIS — I10 Essential (primary) hypertension: Secondary | ICD-10-CM | POA: Diagnosis not present

## 2024-05-23 DIAGNOSIS — F329 Major depressive disorder, single episode, unspecified: Secondary | ICD-10-CM | POA: Diagnosis not present

## 2024-05-23 DIAGNOSIS — K219 Gastro-esophageal reflux disease without esophagitis: Secondary | ICD-10-CM | POA: Diagnosis not present

## 2024-05-23 DIAGNOSIS — J449 Chronic obstructive pulmonary disease, unspecified: Secondary | ICD-10-CM | POA: Diagnosis not present

## 2024-05-23 DIAGNOSIS — J84112 Idiopathic pulmonary fibrosis: Secondary | ICD-10-CM | POA: Diagnosis not present

## 2024-05-25 ENCOUNTER — Ambulatory Visit: Admitting: Pulmonary Disease

## 2024-05-25 ENCOUNTER — Telehealth: Payer: Self-pay

## 2024-05-25 ENCOUNTER — Encounter: Payer: Self-pay | Admitting: Pulmonary Disease

## 2024-05-25 VITALS — BP 113/69 | HR 66 | Ht 68.0 in | Wt 143.0 lb

## 2024-05-25 DIAGNOSIS — J84112 Idiopathic pulmonary fibrosis: Secondary | ICD-10-CM | POA: Diagnosis not present

## 2024-05-25 DIAGNOSIS — R042 Hemoptysis: Secondary | ICD-10-CM | POA: Diagnosis not present

## 2024-05-25 DIAGNOSIS — G459 Transient cerebral ischemic attack, unspecified: Secondary | ICD-10-CM | POA: Diagnosis not present

## 2024-05-25 DIAGNOSIS — J8410 Pulmonary fibrosis, unspecified: Secondary | ICD-10-CM

## 2024-05-25 DIAGNOSIS — J9611 Chronic respiratory failure with hypoxia: Secondary | ICD-10-CM | POA: Diagnosis not present

## 2024-05-25 DIAGNOSIS — J449 Chronic obstructive pulmonary disease, unspecified: Secondary | ICD-10-CM | POA: Diagnosis not present

## 2024-05-25 DIAGNOSIS — J019 Acute sinusitis, unspecified: Secondary | ICD-10-CM | POA: Diagnosis not present

## 2024-05-25 DIAGNOSIS — I480 Paroxysmal atrial fibrillation: Secondary | ICD-10-CM | POA: Diagnosis not present

## 2024-05-25 DIAGNOSIS — F323 Major depressive disorder, single episode, severe with psychotic features: Secondary | ICD-10-CM | POA: Diagnosis not present

## 2024-05-25 DIAGNOSIS — I10 Essential (primary) hypertension: Secondary | ICD-10-CM | POA: Diagnosis not present

## 2024-05-25 DIAGNOSIS — J439 Emphysema, unspecified: Secondary | ICD-10-CM | POA: Diagnosis not present

## 2024-05-25 DIAGNOSIS — F329 Major depressive disorder, single episode, unspecified: Secondary | ICD-10-CM | POA: Diagnosis not present

## 2024-05-25 DIAGNOSIS — F060 Psychotic disorder with hallucinations due to known physiological condition: Secondary | ICD-10-CM | POA: Diagnosis not present

## 2024-05-25 DIAGNOSIS — J309 Allergic rhinitis, unspecified: Secondary | ICD-10-CM | POA: Diagnosis not present

## 2024-05-25 DIAGNOSIS — K219 Gastro-esophageal reflux disease without esophagitis: Secondary | ICD-10-CM | POA: Diagnosis not present

## 2024-05-25 NOTE — Progress Notes (Signed)
 Synopsis: Referred in June 2025 for hospital follow up for pulmonary fibrosis  Subjective:   PATIENT ID: Jason Farmer GENDER: male DOB: 05/28/1935, MRN: [id number]  HPI  Chief Complaint  Patient presents with   Medical Management of Chronic Issues    PT states x 2 weeks ago coughing up blood possibly a bit over table spoon or so, has stopped since added the humidification     Jason Farmer is an 89 year old male, remote former smoker with history of GERD and atrial fibrillation who returns to pulmonary clinic for pulmonary fibrosis and chronic respiratory failure.  He reports gradual improvement since last visit. He has stopped coughing up blood over the past few days. He has been increasing his physical activity by walking. Appetite is reduced, consuming about one and a half meals a day. He previously received nutritional supplements like Boost or Ensure. He is followed by hospice care at [facility]. He is accompanied by his daughter.  Reviewed antifibrotic therapies now that he is feeling better. They are interested in meeting with pharmacy team.   OV 03/22/24 He has experienced hemoptysis approximately four times over the past few weeks, with blood now originating from the lungs. He is on anticoagulation for atrial fibrillation. The volume of blood is concerning. Chills have occurred over the past few days, with an episode of feeling extremely cold despite a warm environment. He feels 'woozy,' interpreted as vertigo, but no fever is present.  He was on a steroid taper for acute inflammation. He is currently on oxygen therapy, requiring 3 to 4 liters at rest, and experiences significant shortness of breath with minimal exertion. He uses a mobility scooter for assistance.  He is no longer participating in physical or occupational therapy, and there is uncertainty about his benefits or risks to his lung condition. He expresses that living is not enjoyable and discusses end-of-life  preferences, vs on going work up and treatment.  OV 02/18/24 He was admitted 6/10 to 6/12 for respiratory failure found to have pulmonary fibrosis. He was sent home on 3L of oxygen and a steroid taper for concern of possible ILD flare. Autoimmune testing was negative. He has history of owning an HVAC business and reports very little dust/chemical exposure. He reports chronic cough for decades with sputum production on a daily basis. He quit smoking 40 years ago, but smoked for 30 years multiple packs per day. He has not seen a lung doctor before. He is living at a retirement home as his wife's health has recently declined needing more support.    CTA PE study is negative for pulmonary emboli. He has peripheral fibrotic changes with craniocaudal gradient. Compared to 2023, increase in left upper lobe fibrosis. Emphysematous changes as well.   He was sent home on supplemental oxygen. O2 from hospital below.  SATURATION QUALIFICATIONS: (This note is used to comply with regulatory documentation for home oxygen)   Patient Saturations on Room Air at Rest = 93%   Patient Saturations on Room Air while Ambulating = 85%   Patient Saturations on 3 Liters of oxygen while Ambulating = 90%  Today, he complains of feeling woozy which he feels quite often. He reports faitgue and generally not feeling well.   Past Medical History:  Diagnosis Date   Allergy    Anxiety    not a problem for a long time (11/27/2016)   Arthritis    wrists, knees, shoulders, back (11/27/2016)   Chronic lower back pain  5th disc is protruding (11/27/2016)   Essential tremor    GERD (gastroesophageal reflux disease)    Glaucoma    Heart murmur    dx'd in [employer] in the 1960s; haven't been seen for it since (11/27/2016)   Hepatitis ~ 1958   jaundice kind   HOH (hard of hearing)    Hyperlipemia    Migraine    none since my neck OR (11/27/2016)   Seasonal allergies    TIA (transient ischemic attack) 04/2015   Wears  glasses    Wears hearing aid      Family History  Problem Relation Age of Onset   Glaucoma Mother    Dementia Father    Heart failure Father    Colon cancer Neg Hx    Rectal cancer Neg Hx    Stomach cancer Neg Hx    Esophageal cancer Neg Hx    Colon polyps Neg Hx      Social History   Socioeconomic History   Marital status: Married    Spouse name: Not on file   Number of children: 4   Years of education: Not on file   Highest education level: Some college, no degree  Occupational History   Not on file  Tobacco Use   Smoking status: Former    Current packs/day: 0.00    Average packs/day: 2.0 packs/day for 30.0 years (60.0 ttl pk-yrs)    Types: Cigarettes    Start date: 06/03/1954    Quit date: 06/03/1984    Years since quitting: 40.0   Smokeless tobacco: Never   Tobacco comments:    Former smoker (had AAA at the [place]; also noted 2018 on Dr. Claiborne note) 05/01/23  Vaping Use   Vaping status: Never Used  Substance and Sexual Activity   Alcohol use: Never    Alcohol/week: 1.0 standard drink of alcohol    Types: 1 Cans of beer per week   Drug use: No   Sexual activity: Not Currently  Other Topics Concern   Not on file  Social History Narrative   Retired from [employer]   Married    4 children, one son lives locally   Lives with wife   retired   Chief Executive Officer Drivers of Corporate investment banker Strain: Low Risk  (03/23/2023)   Overall Financial Resource Strain (CARDIA)    Difficulty of Paying Living Expenses: Not hard at all  Food Insecurity: No Food Insecurity (02/02/2024)   Hunger Vital Sign    Worried About Running Out of Food in the Last Year: Never true    Ran Out of Food in the Last Year: Never true  Transportation Needs: No Transportation Needs (02/02/2024)   PRAPARE - Administrator, Civil Service (Medical): No    Lack of Transportation (Non-Medical): No  Physical Activity: Insufficiently Active (03/23/2023)   Exercise Vital Sign    Days of  Exercise per Week: 3 days    Minutes of Exercise per Session: 40 min  Stress: No Stress Concern Present (03/23/2023)   [employer] of Occupational Health - Occupational Stress Questionnaire    Feeling of Stress : Not at all  Social Connections: Moderately Isolated (02/02/2024)   Social Connection and Isolation Panel    Frequency of Communication with Friends and Family: More than three times a week    Frequency of Social Gatherings with Friends and Family: More than three times a week    Attends Religious Services: Never    Active Member  of Clubs or Organizations: No    Attends Banker Meetings: Never    Marital Status: Married  Catering manager Violence: Not At Risk (02/02/2024)   Humiliation, Afraid, Rape, and Kick questionnaire    Fear of Current or Ex-Partner: No    Emotionally Abused: No    Physically Abused: No    Sexually Abused: No     Allergies  Allergen Reactions   Statins Other (See Comments)    Muscle cramps/pain    Aspirin  Other (See Comments)    Bleeding   Atorvastatin Other (See Comments)    Muscle pain     Outpatient Medications Prior to Visit  Medication Sig Dispense Refill   acetaminophen  (TYLENOL ) 325 MG tablet Take 325 mg by mouth every 6 (six) hours as needed for mild pain (pain score 1-3), moderate pain (pain score 4-6) or headache.     albuterol  (VENTOLIN  HFA) 108 (90 Base) MCG/ACT inhaler Inhale 2 puffs into the lungs every 6 (six) hours as needed for wheezing or shortness of breath. 8 g 2   dabigatran  (PRADAXA ) 150 MG CAPS capsule Take 1 capsule (150 mg total) by mouth 2 (two) times daily. 60 capsule 6   escitalopram  (LEXAPRO ) 10 MG tablet Take 1 tablet (10 mg total) by mouth daily. (Patient taking differently: Take 10 mg by mouth at bedtime.) 90 tablet 2   ezetimibe  (ZETIA ) 10 MG tablet TAKE ONE TABLET BY MOUTH IN THE EVENING 30 tablet 0   fexofenadine  (ALLEGRA ) 180 MG tablet Take 1 tablet (180 mg total) by mouth daily. 30 tablet 2    meclizine  (ANTIVERT ) 12.5 MG tablet Take 1 tablet (12.5 mg total) by mouth 3 (three) times daily as needed for dizziness. 30 tablet 0   memantine  (NAMENDA ) 10 MG tablet Take 1 tablet (10 mg total) by mouth 2 (two) times daily. 60 tablet 6   memantine  (NAMENDA ) 5 MG tablet Take 5 mg by mouth in the morning and at bedtime.     metoprolol  tartrate (LOPRESSOR ) 50 MG tablet Take 1 tablet (50 mg total) by mouth 2 (two) times daily. (Patient taking differently: Take 50 mg by mouth in the morning and at bedtime.) 60 tablet 0   mirtazapine  (REMERON ) 15 MG tablet Take 1 tablet (15 mg total) by mouth every evening. 30 tablet 0   omeprazole  (PRILOSEC) 20 MG capsule Take 1 capsule (20 mg total) by mouth every morning. 30 capsule 0   polyethylene glycol (MIRALAX  / GLYCOLAX ) 17 g packet Take 17 g by mouth daily. 14 each 0   QUEtiapine  (SEROQUEL ) 25 MG tablet TAKE ONE TABLET BY MOUTH AT BEDTIME 30 tablet 1   doxycycline  (VIBRA -TABS) 100 MG tablet Take 1 tablet (100 mg total) by mouth 2 (two) times daily. 14 tablet 0   No facility-administered medications prior to visit.    Review of Systems  Constitutional:  Negative for chills, fever, malaise/fatigue and weight loss.  HENT:  Negative for congestion, sinus pain and sore throat.   Eyes: Negative.   Respiratory:  Positive for cough and shortness of breath. Negative for hemoptysis, sputum production and wheezing.   Cardiovascular:  Negative for chest pain, palpitations, orthopnea, claudication and leg swelling.  Gastrointestinal:  Negative for abdominal pain, heartburn, nausea and vomiting.  Genitourinary: Negative.   Musculoskeletal:  Negative for joint pain and myalgias.  Skin:  Negative for rash.  Neurological:  Negative for dizziness and weakness.  Endo/Heme/Allergies: Negative.   Psychiatric/Behavioral: Negative.     Objective:   Vitals:  05/25/24 1120  BP: 113/69  Pulse: 66  SpO2: 94%  Weight: 143 lb (64.9 kg)  Height: 5' 8 (1.727 m)  PF:  (!) 3 L/min     Physical Exam Constitutional:      General: He is not in acute distress.    Appearance: Normal appearance.  Eyes:     General: No scleral icterus.    Conjunctiva/sclera: Conjunctivae normal.  Cardiovascular:     Rate and Rhythm: Normal rate and regular rhythm.  Pulmonary:     Breath sounds: Rales (scattered throughout) present. No wheezing or rhonchi.  Musculoskeletal:     Right lower leg: No edema.     Left lower leg: No edema.  Skin:    General: Skin is warm and dry.  Neurological:     General: No focal deficit present.     CBC    Component Value Date/Time   WBC 15.4 (H) 02/18/2024 1225   RBC 4.82 02/18/2024 1225   HGB 14.0 02/18/2024 1225   HCT 43.2 02/18/2024 1225   PLT 306.0 02/18/2024 1225   MCV 89.6 02/18/2024 1225   MCH 30.0 02/04/2024 0542   MCHC 32.5 02/18/2024 1225   RDW 14.7 02/18/2024 1225   LYMPHSABS 3.0 02/18/2024 1225   MONOABS 0.9 02/18/2024 1225   EOSABS 0.1 02/18/2024 1225   BASOSABS 0.0 02/18/2024 1225      Latest Ref Rng & Units 02/18/2024   12:25 PM 02/04/2024    5:42 AM 02/03/2024    5:39 AM  BMP  Glucose 70 - 99 mg/dL 99  853  851   BUN 6 - 23 mg/dL 28  35  27   Creatinine 0.40 - 1.50 mg/dL 8.67  8.36  8.59   Sodium 135 - 145 mEq/L 137  134  133   Potassium 3.5 - 5.1 mEq/L 4.3  4.8  5.1   Chloride 96 - 112 mEq/L 99  104  104   CO2 19 - 32 mEq/L 33  22  22   Calcium  8.4 - 10.5 mg/dL 9.3  9.2  8.8    Chest imaging: HRCT Chest 03/23/24 UIP pattern, stable to prior. Interval decrease in ground-glass component of left lung. No new consolidation.  CT Chest 01/26/24 Cardiovascular: Satisfactory opacification of the pulmonary arteries to the segmental level. No evidence of pulmonary embolism. The heart is mildly enlarged. No pericardial effusion.   Mediastinum/Nodes: No enlarged mediastinal, hilar, or axillary lymph nodes. Thyroid  gland, trachea, and esophagus demonstrate no significant findings.   Lungs/Pleura:  Peripheral fibrotic changes have significantly increased, particularly in the left upper lobe and left lower lobe. Bullae are again noted peripherally. There is some superimposed ground-glass opacities in the left lower lobe and minimally left upper lobe. There is no consolidation, pneumothorax or pleural effusion. Nodular scarring in the right lung apex measures up to 10 mm, unchanged from prior.  PFT:     No data to display          Labs:  Path:  Echo:  Heart Catheterization:       Assessment & Plan:   Pulmonary fibrosis (HCC)  Chronic hypoxemic respiratory failure (HCC)  Hemoptysis  Discussion: Jason Farmer is an 89 year old male, remote former smoker with history of GERD and atrial fibrillation who returns to pulmonary clinic for follow up of pulmonary fibrosis.  Pulmonary fibrosis  Chronic Hypoxemic Respiratory Failure - discussed antifibrotic options, did not start at last visit due to frailty, now that he is improved will schedule  visit with pharmacy team - Continue supplemental oxygen with goal SpO2 88% or greater - Agree with being followed by hospice care - encouraged physical activity and to consider physical therapy at his living facility  Hemoptysis - improved at this time, will continue to monitor  Follow up in 4 months  35 minutes spent on this visit  Dorn Chill, MD [facility] Office: [phone]     Current Outpatient Medications:    acetaminophen  (TYLENOL ) 325 MG tablet, Take 325 mg by mouth every 6 (six) hours as needed for mild pain (pain score 1-3), moderate pain (pain score 4-6) or headache., Disp: , Rfl:    albuterol  (VENTOLIN  HFA) 108 (90 Base) MCG/ACT inhaler, Inhale 2 puffs into the lungs every 6 (six) hours as needed for wheezing or shortness of breath., Disp: 8 g, Rfl: 2   dabigatran  (PRADAXA ) 150 MG CAPS capsule, Take 1 capsule (150 mg total) by mouth 2 (two) times daily., Disp: 60 capsule, Rfl:  6   escitalopram  (LEXAPRO ) 10 MG tablet, Take 1 tablet (10 mg total) by mouth daily. (Patient taking differently: Take 10 mg by mouth at bedtime.), Disp: 90 tablet, Rfl: 2   ezetimibe  (ZETIA ) 10 MG tablet, TAKE ONE TABLET BY MOUTH IN THE EVENING, Disp: 30 tablet, Rfl: 0   fexofenadine  (ALLEGRA ) 180 MG tablet, Take 1 tablet (180 mg total) by mouth daily., Disp: 30 tablet, Rfl: 2   meclizine  (ANTIVERT ) 12.5 MG tablet, Take 1 tablet (12.5 mg total) by mouth 3 (three) times daily as needed for dizziness., Disp: 30 tablet, Rfl: 0   memantine  (NAMENDA ) 10 MG tablet, Take 1 tablet (10 mg total) by mouth 2 (two) times daily., Disp: 60 tablet, Rfl: 6   memantine  (NAMENDA ) 5 MG tablet, Take 5 mg by mouth in the morning and at bedtime., Disp: , Rfl:    metoprolol  tartrate (LOPRESSOR ) 50 MG tablet, Take 1 tablet (50 mg total) by mouth 2 (two) times daily. (Patient taking differently: Take 50 mg by mouth in the morning and at bedtime.), Disp: 60 tablet, Rfl: 0   mirtazapine  (REMERON ) 15 MG tablet, Take 1 tablet (15 mg total) by mouth every evening., Disp: 30 tablet, Rfl: 0   omeprazole  (PRILOSEC) 20 MG capsule, Take 1 capsule (20 mg total) by mouth every morning., Disp: 30 capsule, Rfl: 0   polyethylene glycol (MIRALAX  / GLYCOLAX ) 17 g packet, Take 17 g by mouth daily., Disp: 14 each, Rfl: 0   QUEtiapine  (SEROQUEL ) 25 MG tablet, TAKE ONE TABLET BY MOUTH AT BEDTIME, Disp: 30 tablet, Rfl: 1

## 2024-05-25 NOTE — Telephone Encounter (Signed)
 Tried to reach out to daughter to get her father Jason Farmer on the pharmacy schedule to discuss antifibrotics    Waiting on return call

## 2024-05-25 NOTE — Patient Instructions (Addendum)
 Continue supplemental oxygen   We will schedule you with our pharmacy team to discuss antifibrotics medication   Continue to work on your physical activity  Follow up in 4 months, call sooner if needed

## 2024-05-27 DIAGNOSIS — J449 Chronic obstructive pulmonary disease, unspecified: Secondary | ICD-10-CM | POA: Diagnosis not present

## 2024-05-27 DIAGNOSIS — F329 Major depressive disorder, single episode, unspecified: Secondary | ICD-10-CM | POA: Diagnosis not present

## 2024-05-27 DIAGNOSIS — I10 Essential (primary) hypertension: Secondary | ICD-10-CM | POA: Diagnosis not present

## 2024-05-27 DIAGNOSIS — F060 Psychotic disorder with hallucinations due to known physiological condition: Secondary | ICD-10-CM | POA: Diagnosis not present

## 2024-05-27 DIAGNOSIS — K219 Gastro-esophageal reflux disease without esophagitis: Secondary | ICD-10-CM | POA: Diagnosis not present

## 2024-05-27 DIAGNOSIS — J84112 Idiopathic pulmonary fibrosis: Secondary | ICD-10-CM | POA: Diagnosis not present

## 2024-05-30 DIAGNOSIS — F331 Major depressive disorder, recurrent, moderate: Secondary | ICD-10-CM | POA: Diagnosis not present

## 2024-06-01 DIAGNOSIS — K219 Gastro-esophageal reflux disease without esophagitis: Secondary | ICD-10-CM | POA: Diagnosis not present

## 2024-06-01 DIAGNOSIS — J449 Chronic obstructive pulmonary disease, unspecified: Secondary | ICD-10-CM | POA: Diagnosis not present

## 2024-06-01 DIAGNOSIS — J84112 Idiopathic pulmonary fibrosis: Secondary | ICD-10-CM | POA: Diagnosis not present

## 2024-06-01 DIAGNOSIS — I10 Essential (primary) hypertension: Secondary | ICD-10-CM | POA: Diagnosis not present

## 2024-06-01 DIAGNOSIS — F329 Major depressive disorder, single episode, unspecified: Secondary | ICD-10-CM | POA: Diagnosis not present

## 2024-06-01 DIAGNOSIS — F060 Psychotic disorder with hallucinations due to known physiological condition: Secondary | ICD-10-CM | POA: Diagnosis not present

## 2024-06-03 DIAGNOSIS — F060 Psychotic disorder with hallucinations due to known physiological condition: Secondary | ICD-10-CM | POA: Diagnosis not present

## 2024-06-03 DIAGNOSIS — F329 Major depressive disorder, single episode, unspecified: Secondary | ICD-10-CM | POA: Diagnosis not present

## 2024-06-03 DIAGNOSIS — F3340 Major depressive disorder, recurrent, in remission, unspecified: Secondary | ICD-10-CM | POA: Diagnosis not present

## 2024-06-03 DIAGNOSIS — J449 Chronic obstructive pulmonary disease, unspecified: Secondary | ICD-10-CM | POA: Diagnosis not present

## 2024-06-03 DIAGNOSIS — I10 Essential (primary) hypertension: Secondary | ICD-10-CM | POA: Diagnosis not present

## 2024-06-03 DIAGNOSIS — K219 Gastro-esophageal reflux disease without esophagitis: Secondary | ICD-10-CM | POA: Diagnosis not present

## 2024-06-03 DIAGNOSIS — J84112 Idiopathic pulmonary fibrosis: Secondary | ICD-10-CM | POA: Diagnosis not present

## 2024-06-05 DIAGNOSIS — K219 Gastro-esophageal reflux disease without esophagitis: Secondary | ICD-10-CM | POA: Diagnosis not present

## 2024-06-05 DIAGNOSIS — F329 Major depressive disorder, single episode, unspecified: Secondary | ICD-10-CM | POA: Diagnosis not present

## 2024-06-05 DIAGNOSIS — J449 Chronic obstructive pulmonary disease, unspecified: Secondary | ICD-10-CM | POA: Diagnosis not present

## 2024-06-05 DIAGNOSIS — J84112 Idiopathic pulmonary fibrosis: Secondary | ICD-10-CM | POA: Diagnosis not present

## 2024-06-05 DIAGNOSIS — I10 Essential (primary) hypertension: Secondary | ICD-10-CM | POA: Diagnosis not present

## 2024-06-05 DIAGNOSIS — F060 Psychotic disorder with hallucinations due to known physiological condition: Secondary | ICD-10-CM | POA: Diagnosis not present

## 2024-06-06 DIAGNOSIS — R42 Dizziness and giddiness: Secondary | ICD-10-CM | POA: Diagnosis not present

## 2024-06-06 DIAGNOSIS — G301 Alzheimer's disease with late onset: Secondary | ICD-10-CM | POA: Diagnosis not present

## 2024-06-06 DIAGNOSIS — F02B0 Dementia in other diseases classified elsewhere, moderate, without behavioral disturbance, psychotic disturbance, mood disturbance, and anxiety: Secondary | ICD-10-CM | POA: Diagnosis not present

## 2024-06-06 DIAGNOSIS — F331 Major depressive disorder, recurrent, moderate: Secondary | ICD-10-CM | POA: Diagnosis not present

## 2024-06-08 DIAGNOSIS — J84112 Idiopathic pulmonary fibrosis: Secondary | ICD-10-CM | POA: Diagnosis not present

## 2024-06-08 DIAGNOSIS — J449 Chronic obstructive pulmonary disease, unspecified: Secondary | ICD-10-CM | POA: Diagnosis not present

## 2024-06-08 DIAGNOSIS — F329 Major depressive disorder, single episode, unspecified: Secondary | ICD-10-CM | POA: Diagnosis not present

## 2024-06-08 DIAGNOSIS — K219 Gastro-esophageal reflux disease without esophagitis: Secondary | ICD-10-CM | POA: Diagnosis not present

## 2024-06-08 DIAGNOSIS — F060 Psychotic disorder with hallucinations due to known physiological condition: Secondary | ICD-10-CM | POA: Diagnosis not present

## 2024-06-08 DIAGNOSIS — I10 Essential (primary) hypertension: Secondary | ICD-10-CM | POA: Diagnosis not present

## 2024-06-10 DIAGNOSIS — J449 Chronic obstructive pulmonary disease, unspecified: Secondary | ICD-10-CM | POA: Diagnosis not present

## 2024-06-10 DIAGNOSIS — J84112 Idiopathic pulmonary fibrosis: Secondary | ICD-10-CM | POA: Diagnosis not present

## 2024-06-10 DIAGNOSIS — I10 Essential (primary) hypertension: Secondary | ICD-10-CM | POA: Diagnosis not present

## 2024-06-10 DIAGNOSIS — F060 Psychotic disorder with hallucinations due to known physiological condition: Secondary | ICD-10-CM | POA: Diagnosis not present

## 2024-06-10 DIAGNOSIS — F329 Major depressive disorder, single episode, unspecified: Secondary | ICD-10-CM | POA: Diagnosis not present

## 2024-06-10 DIAGNOSIS — K219 Gastro-esophageal reflux disease without esophagitis: Secondary | ICD-10-CM | POA: Diagnosis not present

## 2024-06-13 DIAGNOSIS — F330 Major depressive disorder, recurrent, mild: Secondary | ICD-10-CM | POA: Diagnosis not present

## 2024-06-15 DIAGNOSIS — F060 Psychotic disorder with hallucinations due to known physiological condition: Secondary | ICD-10-CM | POA: Diagnosis not present

## 2024-06-15 DIAGNOSIS — K219 Gastro-esophageal reflux disease without esophagitis: Secondary | ICD-10-CM | POA: Diagnosis not present

## 2024-06-15 DIAGNOSIS — I10 Essential (primary) hypertension: Secondary | ICD-10-CM | POA: Diagnosis not present

## 2024-06-15 DIAGNOSIS — F329 Major depressive disorder, single episode, unspecified: Secondary | ICD-10-CM | POA: Diagnosis not present

## 2024-06-15 DIAGNOSIS — J449 Chronic obstructive pulmonary disease, unspecified: Secondary | ICD-10-CM | POA: Diagnosis not present

## 2024-06-15 DIAGNOSIS — J84112 Idiopathic pulmonary fibrosis: Secondary | ICD-10-CM | POA: Diagnosis not present

## 2024-06-17 DIAGNOSIS — K219 Gastro-esophageal reflux disease without esophagitis: Secondary | ICD-10-CM | POA: Diagnosis not present

## 2024-06-17 DIAGNOSIS — J449 Chronic obstructive pulmonary disease, unspecified: Secondary | ICD-10-CM | POA: Diagnosis not present

## 2024-06-17 DIAGNOSIS — F060 Psychotic disorder with hallucinations due to known physiological condition: Secondary | ICD-10-CM | POA: Diagnosis not present

## 2024-06-17 DIAGNOSIS — F329 Major depressive disorder, single episode, unspecified: Secondary | ICD-10-CM | POA: Diagnosis not present

## 2024-06-17 DIAGNOSIS — J84112 Idiopathic pulmonary fibrosis: Secondary | ICD-10-CM | POA: Diagnosis not present

## 2024-06-17 DIAGNOSIS — I10 Essential (primary) hypertension: Secondary | ICD-10-CM | POA: Diagnosis not present

## 2024-06-21 DIAGNOSIS — I10 Essential (primary) hypertension: Secondary | ICD-10-CM | POA: Diagnosis not present

## 2024-06-21 DIAGNOSIS — K219 Gastro-esophageal reflux disease without esophagitis: Secondary | ICD-10-CM | POA: Diagnosis not present

## 2024-06-21 DIAGNOSIS — F060 Psychotic disorder with hallucinations due to known physiological condition: Secondary | ICD-10-CM | POA: Diagnosis not present

## 2024-06-21 DIAGNOSIS — F329 Major depressive disorder, single episode, unspecified: Secondary | ICD-10-CM | POA: Diagnosis not present

## 2024-06-21 DIAGNOSIS — J449 Chronic obstructive pulmonary disease, unspecified: Secondary | ICD-10-CM | POA: Diagnosis not present

## 2024-06-21 DIAGNOSIS — J84112 Idiopathic pulmonary fibrosis: Secondary | ICD-10-CM | POA: Diagnosis not present

## 2024-06-22 DIAGNOSIS — J84112 Idiopathic pulmonary fibrosis: Secondary | ICD-10-CM | POA: Diagnosis not present

## 2024-06-22 DIAGNOSIS — K219 Gastro-esophageal reflux disease without esophagitis: Secondary | ICD-10-CM | POA: Diagnosis not present

## 2024-06-22 DIAGNOSIS — I10 Essential (primary) hypertension: Secondary | ICD-10-CM | POA: Diagnosis not present

## 2024-06-22 DIAGNOSIS — F060 Psychotic disorder with hallucinations due to known physiological condition: Secondary | ICD-10-CM | POA: Diagnosis not present

## 2024-06-22 DIAGNOSIS — J449 Chronic obstructive pulmonary disease, unspecified: Secondary | ICD-10-CM | POA: Diagnosis not present

## 2024-06-22 DIAGNOSIS — F329 Major depressive disorder, single episode, unspecified: Secondary | ICD-10-CM | POA: Diagnosis not present

## 2024-06-24 DIAGNOSIS — I10 Essential (primary) hypertension: Secondary | ICD-10-CM | POA: Diagnosis not present

## 2024-06-24 DIAGNOSIS — J449 Chronic obstructive pulmonary disease, unspecified: Secondary | ICD-10-CM | POA: Diagnosis not present

## 2024-06-24 DIAGNOSIS — J84112 Idiopathic pulmonary fibrosis: Secondary | ICD-10-CM | POA: Diagnosis not present

## 2024-06-24 DIAGNOSIS — K219 Gastro-esophageal reflux disease without esophagitis: Secondary | ICD-10-CM | POA: Diagnosis not present

## 2024-06-24 DIAGNOSIS — F329 Major depressive disorder, single episode, unspecified: Secondary | ICD-10-CM | POA: Diagnosis not present

## 2024-06-24 DIAGNOSIS — F060 Psychotic disorder with hallucinations due to known physiological condition: Secondary | ICD-10-CM | POA: Diagnosis not present

## 2024-06-25 DIAGNOSIS — J439 Emphysema, unspecified: Secondary | ICD-10-CM | POA: Diagnosis not present

## 2024-06-25 DIAGNOSIS — F329 Major depressive disorder, single episode, unspecified: Secondary | ICD-10-CM | POA: Diagnosis not present

## 2024-06-25 DIAGNOSIS — J019 Acute sinusitis, unspecified: Secondary | ICD-10-CM | POA: Diagnosis not present

## 2024-06-25 DIAGNOSIS — F060 Psychotic disorder with hallucinations due to known physiological condition: Secondary | ICD-10-CM | POA: Diagnosis not present

## 2024-06-25 DIAGNOSIS — J449 Chronic obstructive pulmonary disease, unspecified: Secondary | ICD-10-CM | POA: Diagnosis not present

## 2024-06-25 DIAGNOSIS — J84112 Idiopathic pulmonary fibrosis: Secondary | ICD-10-CM | POA: Diagnosis not present

## 2024-06-25 DIAGNOSIS — K219 Gastro-esophageal reflux disease without esophagitis: Secondary | ICD-10-CM | POA: Diagnosis not present

## 2024-06-25 DIAGNOSIS — J309 Allergic rhinitis, unspecified: Secondary | ICD-10-CM | POA: Diagnosis not present

## 2024-06-25 DIAGNOSIS — G459 Transient cerebral ischemic attack, unspecified: Secondary | ICD-10-CM | POA: Diagnosis not present

## 2024-06-25 DIAGNOSIS — I480 Paroxysmal atrial fibrillation: Secondary | ICD-10-CM | POA: Diagnosis not present

## 2024-06-25 DIAGNOSIS — I10 Essential (primary) hypertension: Secondary | ICD-10-CM | POA: Diagnosis not present

## 2024-06-25 DIAGNOSIS — F323 Major depressive disorder, single episode, severe with psychotic features: Secondary | ICD-10-CM | POA: Diagnosis not present

## 2024-06-29 DIAGNOSIS — F329 Major depressive disorder, single episode, unspecified: Secondary | ICD-10-CM | POA: Diagnosis not present

## 2024-06-29 DIAGNOSIS — F060 Psychotic disorder with hallucinations due to known physiological condition: Secondary | ICD-10-CM | POA: Diagnosis not present

## 2024-06-29 DIAGNOSIS — K219 Gastro-esophageal reflux disease without esophagitis: Secondary | ICD-10-CM | POA: Diagnosis not present

## 2024-06-29 DIAGNOSIS — I10 Essential (primary) hypertension: Secondary | ICD-10-CM | POA: Diagnosis not present

## 2024-06-29 DIAGNOSIS — J84112 Idiopathic pulmonary fibrosis: Secondary | ICD-10-CM | POA: Diagnosis not present

## 2024-06-29 DIAGNOSIS — J449 Chronic obstructive pulmonary disease, unspecified: Secondary | ICD-10-CM | POA: Diagnosis not present

## 2024-07-01 DIAGNOSIS — J449 Chronic obstructive pulmonary disease, unspecified: Secondary | ICD-10-CM | POA: Diagnosis not present

## 2024-07-01 DIAGNOSIS — F329 Major depressive disorder, single episode, unspecified: Secondary | ICD-10-CM | POA: Diagnosis not present

## 2024-07-01 DIAGNOSIS — F060 Psychotic disorder with hallucinations due to known physiological condition: Secondary | ICD-10-CM | POA: Diagnosis not present

## 2024-07-01 DIAGNOSIS — K219 Gastro-esophageal reflux disease without esophagitis: Secondary | ICD-10-CM | POA: Diagnosis not present

## 2024-07-01 DIAGNOSIS — I10 Essential (primary) hypertension: Secondary | ICD-10-CM | POA: Diagnosis not present

## 2024-07-01 DIAGNOSIS — J84112 Idiopathic pulmonary fibrosis: Secondary | ICD-10-CM | POA: Diagnosis not present

## 2024-07-04 DIAGNOSIS — F060 Psychotic disorder with hallucinations due to known physiological condition: Secondary | ICD-10-CM | POA: Diagnosis not present

## 2024-07-04 DIAGNOSIS — J84112 Idiopathic pulmonary fibrosis: Secondary | ICD-10-CM | POA: Diagnosis not present

## 2024-07-04 DIAGNOSIS — J449 Chronic obstructive pulmonary disease, unspecified: Secondary | ICD-10-CM | POA: Diagnosis not present

## 2024-07-04 DIAGNOSIS — F419 Anxiety disorder, unspecified: Secondary | ICD-10-CM | POA: Diagnosis not present

## 2024-07-04 DIAGNOSIS — F329 Major depressive disorder, single episode, unspecified: Secondary | ICD-10-CM | POA: Diagnosis not present

## 2024-07-04 DIAGNOSIS — F331 Major depressive disorder, recurrent, moderate: Secondary | ICD-10-CM | POA: Diagnosis not present

## 2024-07-04 DIAGNOSIS — I10 Essential (primary) hypertension: Secondary | ICD-10-CM | POA: Diagnosis not present

## 2024-07-04 DIAGNOSIS — E785 Hyperlipidemia, unspecified: Secondary | ICD-10-CM | POA: Diagnosis not present

## 2024-07-04 DIAGNOSIS — I4891 Unspecified atrial fibrillation: Secondary | ICD-10-CM | POA: Diagnosis not present

## 2024-07-04 DIAGNOSIS — K219 Gastro-esophageal reflux disease without esophagitis: Secondary | ICD-10-CM | POA: Diagnosis not present

## 2024-07-04 DIAGNOSIS — F02B0 Dementia in other diseases classified elsewhere, moderate, without behavioral disturbance, psychotic disturbance, mood disturbance, and anxiety: Secondary | ICD-10-CM | POA: Diagnosis not present

## 2024-07-04 DIAGNOSIS — G301 Alzheimer's disease with late onset: Secondary | ICD-10-CM | POA: Diagnosis not present

## 2024-07-06 DIAGNOSIS — F060 Psychotic disorder with hallucinations due to known physiological condition: Secondary | ICD-10-CM | POA: Diagnosis not present

## 2024-07-06 DIAGNOSIS — I10 Essential (primary) hypertension: Secondary | ICD-10-CM | POA: Diagnosis not present

## 2024-07-06 DIAGNOSIS — F329 Major depressive disorder, single episode, unspecified: Secondary | ICD-10-CM | POA: Diagnosis not present

## 2024-07-06 DIAGNOSIS — J84112 Idiopathic pulmonary fibrosis: Secondary | ICD-10-CM | POA: Diagnosis not present

## 2024-07-06 DIAGNOSIS — K219 Gastro-esophageal reflux disease without esophagitis: Secondary | ICD-10-CM | POA: Diagnosis not present

## 2024-07-06 DIAGNOSIS — J449 Chronic obstructive pulmonary disease, unspecified: Secondary | ICD-10-CM | POA: Diagnosis not present

## 2024-07-11 DIAGNOSIS — K219 Gastro-esophageal reflux disease without esophagitis: Secondary | ICD-10-CM | POA: Diagnosis not present

## 2024-07-11 DIAGNOSIS — J84112 Idiopathic pulmonary fibrosis: Secondary | ICD-10-CM | POA: Diagnosis not present

## 2024-07-11 DIAGNOSIS — I10 Essential (primary) hypertension: Secondary | ICD-10-CM | POA: Diagnosis not present

## 2024-07-11 DIAGNOSIS — J449 Chronic obstructive pulmonary disease, unspecified: Secondary | ICD-10-CM | POA: Diagnosis not present

## 2024-07-11 DIAGNOSIS — F060 Psychotic disorder with hallucinations due to known physiological condition: Secondary | ICD-10-CM | POA: Diagnosis not present

## 2024-07-11 DIAGNOSIS — F329 Major depressive disorder, single episode, unspecified: Secondary | ICD-10-CM | POA: Diagnosis not present

## 2024-07-13 DIAGNOSIS — J449 Chronic obstructive pulmonary disease, unspecified: Secondary | ICD-10-CM | POA: Diagnosis not present

## 2024-07-13 DIAGNOSIS — J84112 Idiopathic pulmonary fibrosis: Secondary | ICD-10-CM | POA: Diagnosis not present

## 2024-07-13 DIAGNOSIS — K219 Gastro-esophageal reflux disease without esophagitis: Secondary | ICD-10-CM | POA: Diagnosis not present

## 2024-07-13 DIAGNOSIS — I10 Essential (primary) hypertension: Secondary | ICD-10-CM | POA: Diagnosis not present

## 2024-07-13 DIAGNOSIS — F060 Psychotic disorder with hallucinations due to known physiological condition: Secondary | ICD-10-CM | POA: Diagnosis not present

## 2024-07-13 DIAGNOSIS — F329 Major depressive disorder, single episode, unspecified: Secondary | ICD-10-CM | POA: Diagnosis not present

## 2024-07-18 DIAGNOSIS — F060 Psychotic disorder with hallucinations due to known physiological condition: Secondary | ICD-10-CM | POA: Diagnosis not present

## 2024-07-18 DIAGNOSIS — J84112 Idiopathic pulmonary fibrosis: Secondary | ICD-10-CM | POA: Diagnosis not present

## 2024-07-18 DIAGNOSIS — I10 Essential (primary) hypertension: Secondary | ICD-10-CM | POA: Diagnosis not present

## 2024-07-18 DIAGNOSIS — J449 Chronic obstructive pulmonary disease, unspecified: Secondary | ICD-10-CM | POA: Diagnosis not present

## 2024-07-18 DIAGNOSIS — K219 Gastro-esophageal reflux disease without esophagitis: Secondary | ICD-10-CM | POA: Diagnosis not present

## 2024-07-18 DIAGNOSIS — F329 Major depressive disorder, single episode, unspecified: Secondary | ICD-10-CM | POA: Diagnosis not present

## 2024-07-20 DIAGNOSIS — F060 Psychotic disorder with hallucinations due to known physiological condition: Secondary | ICD-10-CM | POA: Diagnosis not present

## 2024-07-20 DIAGNOSIS — J449 Chronic obstructive pulmonary disease, unspecified: Secondary | ICD-10-CM | POA: Diagnosis not present

## 2024-07-20 DIAGNOSIS — I10 Essential (primary) hypertension: Secondary | ICD-10-CM | POA: Diagnosis not present

## 2024-07-20 DIAGNOSIS — J84112 Idiopathic pulmonary fibrosis: Secondary | ICD-10-CM | POA: Diagnosis not present

## 2024-07-20 DIAGNOSIS — F329 Major depressive disorder, single episode, unspecified: Secondary | ICD-10-CM | POA: Diagnosis not present

## 2024-07-20 DIAGNOSIS — K219 Gastro-esophageal reflux disease without esophagitis: Secondary | ICD-10-CM | POA: Diagnosis not present

## 2024-07-22 DIAGNOSIS — J84112 Idiopathic pulmonary fibrosis: Secondary | ICD-10-CM | POA: Diagnosis not present

## 2024-07-22 DIAGNOSIS — J449 Chronic obstructive pulmonary disease, unspecified: Secondary | ICD-10-CM | POA: Diagnosis not present

## 2024-07-22 DIAGNOSIS — K219 Gastro-esophageal reflux disease without esophagitis: Secondary | ICD-10-CM | POA: Diagnosis not present

## 2024-07-22 DIAGNOSIS — I10 Essential (primary) hypertension: Secondary | ICD-10-CM | POA: Diagnosis not present

## 2024-07-22 DIAGNOSIS — F060 Psychotic disorder with hallucinations due to known physiological condition: Secondary | ICD-10-CM | POA: Diagnosis not present

## 2024-07-22 DIAGNOSIS — F329 Major depressive disorder, single episode, unspecified: Secondary | ICD-10-CM | POA: Diagnosis not present

## 2024-07-27 DIAGNOSIS — K219 Gastro-esophageal reflux disease without esophagitis: Secondary | ICD-10-CM

## 2024-07-27 DIAGNOSIS — F32A Depression, unspecified: Secondary | ICD-10-CM

## 2024-07-27 DIAGNOSIS — I10 Essential (primary) hypertension: Secondary | ICD-10-CM

## 2024-07-27 DIAGNOSIS — E785 Hyperlipidemia, unspecified: Secondary | ICD-10-CM

## 2024-07-28 NOTE — Telephone Encounter (Signed)
 NFN

## 2024-08-01 DIAGNOSIS — F02B0 Dementia in other diseases classified elsewhere, moderate, without behavioral disturbance, psychotic disturbance, mood disturbance, and anxiety: Secondary | ICD-10-CM | POA: Diagnosis not present

## 2024-08-01 DIAGNOSIS — F331 Major depressive disorder, recurrent, moderate: Secondary | ICD-10-CM | POA: Diagnosis not present

## 2024-08-01 DIAGNOSIS — G301 Alzheimer's disease with late onset: Secondary | ICD-10-CM | POA: Diagnosis not present
